# Patient Record
Sex: Female | Born: 1935 | Race: Black or African American | Hispanic: No | Smoking: Never smoker
Health system: Southern US, Community
[De-identification: ages and names within clinical notes are randomized; demographics above are authoritative.]

## PROBLEM LIST (undated history)

## (undated) DIAGNOSIS — Z860101 Personal history of adenomatous and serrated colon polyps: Secondary | ICD-10-CM

## (undated) DIAGNOSIS — E669 Obesity, unspecified: Secondary | ICD-10-CM

## (undated) DIAGNOSIS — K449 Diaphragmatic hernia without obstruction or gangrene: Secondary | ICD-10-CM

## (undated) DIAGNOSIS — N289 Disorder of kidney and ureter, unspecified: Secondary | ICD-10-CM

## (undated) DIAGNOSIS — K5790 Diverticulosis of intestine, part unspecified, without perforation or abscess without bleeding: Secondary | ICD-10-CM

## (undated) DIAGNOSIS — M1990 Unspecified osteoarthritis, unspecified site: Secondary | ICD-10-CM

## (undated) DIAGNOSIS — K222 Esophageal obstruction: Secondary | ICD-10-CM

## (undated) DIAGNOSIS — R609 Edema, unspecified: Secondary | ICD-10-CM

## (undated) DIAGNOSIS — E119 Type 2 diabetes mellitus without complications: Secondary | ICD-10-CM

## (undated) DIAGNOSIS — K219 Gastro-esophageal reflux disease without esophagitis: Secondary | ICD-10-CM

## (undated) DIAGNOSIS — D139 Benign neoplasm of ill-defined sites within the digestive system: Secondary | ICD-10-CM

## (undated) DIAGNOSIS — D1399 Benign neoplasm of ill-defined sites within the digestive system: Secondary | ICD-10-CM

## (undated) DIAGNOSIS — I119 Hypertensive heart disease without heart failure: Secondary | ICD-10-CM

## (undated) DIAGNOSIS — I10 Essential (primary) hypertension: Secondary | ICD-10-CM

## (undated) DIAGNOSIS — D509 Iron deficiency anemia, unspecified: Secondary | ICD-10-CM

## (undated) DIAGNOSIS — E785 Hyperlipidemia, unspecified: Secondary | ICD-10-CM

## (undated) DIAGNOSIS — R109 Unspecified abdominal pain: Secondary | ICD-10-CM

## (undated) DIAGNOSIS — Z86010 Personal history of colonic polyps: Secondary | ICD-10-CM

## (undated) DIAGNOSIS — F0390 Unspecified dementia without behavioral disturbance: Secondary | ICD-10-CM

## (undated) DIAGNOSIS — G009 Bacterial meningitis, unspecified: Secondary | ICD-10-CM

## (undated) DIAGNOSIS — K2950 Unspecified chronic gastritis without bleeding: Secondary | ICD-10-CM

## (undated) DIAGNOSIS — G8929 Other chronic pain: Secondary | ICD-10-CM

## (undated) HISTORY — DX: Benign neoplasm of ill-defined sites within the digestive system: D13.9

## (undated) HISTORY — DX: Unspecified chronic gastritis without bleeding: K29.50

## (undated) HISTORY — DX: Gastro-esophageal reflux disease without esophagitis: K21.9

## (undated) HISTORY — PX: BACK SURGERY: SHX140

## (undated) HISTORY — DX: Essential (primary) hypertension: I10

## (undated) HISTORY — DX: Personal history of adenomatous and serrated colon polyps: Z86.0101

## (undated) HISTORY — DX: Hyperlipidemia, unspecified: E78.5

## (undated) HISTORY — DX: Unspecified osteoarthritis, unspecified site: M19.90

## (undated) HISTORY — DX: Other chronic pain: G89.29

## (undated) HISTORY — DX: Hypertensive heart disease without heart failure: I11.9

## (undated) HISTORY — DX: Diverticulosis of intestine, part unspecified, without perforation or abscess without bleeding: K57.90

## (undated) HISTORY — DX: Personal history of colonic polyps: Z86.010

## (undated) HISTORY — PX: APPENDECTOMY: SHX54

## (undated) HISTORY — DX: Esophageal obstruction: K22.2

## (undated) HISTORY — DX: Bacterial meningitis, unspecified: G00.9

## (undated) HISTORY — DX: Type 2 diabetes mellitus without complications: E11.9

## (undated) HISTORY — DX: Unspecified dementia, unspecified severity, without behavioral disturbance, psychotic disturbance, mood disturbance, and anxiety: F03.90

## (undated) HISTORY — DX: Unspecified abdominal pain: R10.9

## (undated) HISTORY — DX: Edema, unspecified: R60.9

## (undated) HISTORY — DX: Diaphragmatic hernia without obstruction or gangrene: K44.9

## (undated) HISTORY — DX: Obesity, unspecified: E66.9

## (undated) HISTORY — DX: Iron deficiency anemia, unspecified: D50.9

## (undated) HISTORY — DX: Benign neoplasm of ill-defined sites within the digestive system: D13.99

---
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
## 1997-08-12 HISTORY — PX: CHOLECYSTECTOMY: SHX55

## 1997-11-22 ENCOUNTER — Ambulatory Visit (HOSPITAL_COMMUNITY): Admission: RE | Admit: 1997-11-22 | Discharge: 1997-11-22 | Payer: Self-pay | Admitting: *Deleted

## 1998-02-20 ENCOUNTER — Ambulatory Visit (HOSPITAL_COMMUNITY): Admission: RE | Admit: 1998-02-20 | Discharge: 1998-02-21 | Payer: Self-pay

## 2000-06-16 ENCOUNTER — Encounter: Admission: RE | Admit: 2000-06-16 | Discharge: 2000-06-16 | Payer: Self-pay | Admitting: Neurosurgery

## 2000-06-16 ENCOUNTER — Encounter: Payer: Self-pay | Admitting: Neurosurgery

## 2000-07-23 ENCOUNTER — Ambulatory Visit (HOSPITAL_COMMUNITY): Admission: RE | Admit: 2000-07-23 | Discharge: 2000-07-23 | Payer: Self-pay | Admitting: Neurosurgery

## 2000-07-23 ENCOUNTER — Encounter: Payer: Self-pay | Admitting: Neurosurgery

## 2000-08-06 ENCOUNTER — Ambulatory Visit (HOSPITAL_COMMUNITY): Admission: RE | Admit: 2000-08-06 | Discharge: 2000-08-06 | Payer: Self-pay | Admitting: Neurosurgery

## 2000-08-06 ENCOUNTER — Encounter: Payer: Self-pay | Admitting: Neurosurgery

## 2000-08-20 ENCOUNTER — Ambulatory Visit (HOSPITAL_COMMUNITY): Admission: RE | Admit: 2000-08-20 | Discharge: 2000-08-20 | Payer: Self-pay | Admitting: Neurosurgery

## 2000-08-20 ENCOUNTER — Encounter: Payer: Self-pay | Admitting: Neurosurgery

## 2001-03-02 ENCOUNTER — Encounter: Admission: RE | Admit: 2001-03-02 | Discharge: 2001-03-02 | Payer: Self-pay | Admitting: Neurosurgery

## 2001-03-02 ENCOUNTER — Encounter: Payer: Self-pay | Admitting: Neurosurgery

## 2001-07-01 ENCOUNTER — Ambulatory Visit (HOSPITAL_COMMUNITY): Admission: RE | Admit: 2001-07-01 | Discharge: 2001-07-01 | Payer: Self-pay | Admitting: Gastroenterology

## 2001-07-01 ENCOUNTER — Encounter: Payer: Self-pay | Admitting: Gastroenterology

## 2001-09-24 ENCOUNTER — Encounter: Payer: Self-pay | Admitting: Gastroenterology

## 2001-09-24 ENCOUNTER — Ambulatory Visit (HOSPITAL_COMMUNITY): Admission: RE | Admit: 2001-09-24 | Discharge: 2001-09-24 | Payer: Self-pay | Admitting: Gastroenterology

## 2001-10-05 ENCOUNTER — Encounter: Admission: RE | Admit: 2001-10-05 | Discharge: 2002-01-03 | Payer: Self-pay | Admitting: Internal Medicine

## 2002-06-22 ENCOUNTER — Encounter: Admission: RE | Admit: 2002-06-22 | Discharge: 2002-09-20 | Payer: Self-pay | Admitting: Internal Medicine

## 2003-10-29 ENCOUNTER — Inpatient Hospital Stay (HOSPITAL_COMMUNITY): Admission: EM | Admit: 2003-10-29 | Discharge: 2003-10-31 | Payer: Self-pay | Admitting: Emergency Medicine

## 2004-01-05 ENCOUNTER — Ambulatory Visit (HOSPITAL_COMMUNITY): Admission: RE | Admit: 2004-01-05 | Discharge: 2004-01-05 | Payer: Self-pay | Admitting: Neurosurgery

## 2004-02-10 HISTORY — PX: ROTATOR CUFF REPAIR: SHX139

## 2004-02-27 ENCOUNTER — Encounter: Admission: RE | Admit: 2004-02-27 | Discharge: 2004-02-27 | Payer: Self-pay | Admitting: Orthopedic Surgery

## 2004-02-28 ENCOUNTER — Ambulatory Visit (HOSPITAL_BASED_OUTPATIENT_CLINIC_OR_DEPARTMENT_OTHER): Admission: RE | Admit: 2004-02-28 | Discharge: 2004-02-28 | Payer: Self-pay | Admitting: Orthopedic Surgery

## 2004-02-28 ENCOUNTER — Ambulatory Visit (HOSPITAL_COMMUNITY): Admission: RE | Admit: 2004-02-28 | Discharge: 2004-02-28 | Payer: Self-pay | Admitting: Orthopedic Surgery

## 2004-07-23 ENCOUNTER — Ambulatory Visit: Payer: Self-pay | Admitting: Internal Medicine

## 2004-09-05 ENCOUNTER — Ambulatory Visit: Payer: Self-pay | Admitting: Internal Medicine

## 2005-02-05 ENCOUNTER — Ambulatory Visit: Payer: Self-pay | Admitting: Internal Medicine

## 2005-04-17 ENCOUNTER — Ambulatory Visit: Payer: Self-pay | Admitting: Internal Medicine

## 2005-05-01 ENCOUNTER — Ambulatory Visit: Payer: Self-pay | Admitting: Internal Medicine

## 2005-05-08 ENCOUNTER — Ambulatory Visit: Payer: Self-pay | Admitting: Internal Medicine

## 2005-05-20 ENCOUNTER — Encounter: Admission: RE | Admit: 2005-05-20 | Discharge: 2005-08-11 | Payer: Self-pay | Admitting: Internal Medicine

## 2005-06-19 ENCOUNTER — Ambulatory Visit: Payer: Self-pay | Admitting: Internal Medicine

## 2005-07-19 ENCOUNTER — Ambulatory Visit: Payer: Self-pay | Admitting: Gastroenterology

## 2005-07-23 ENCOUNTER — Ambulatory Visit: Payer: Self-pay | Admitting: Cardiology

## 2005-08-12 HISTORY — PX: HERNIA REPAIR: SHX51

## 2005-08-22 ENCOUNTER — Encounter: Admission: RE | Admit: 2005-08-22 | Discharge: 2005-08-22 | Payer: Self-pay | Admitting: Surgery

## 2005-08-23 ENCOUNTER — Ambulatory Visit (HOSPITAL_BASED_OUTPATIENT_CLINIC_OR_DEPARTMENT_OTHER): Admission: RE | Admit: 2005-08-23 | Discharge: 2005-08-23 | Payer: Self-pay | Admitting: Surgery

## 2005-08-23 ENCOUNTER — Encounter (INDEPENDENT_AMBULATORY_CARE_PROVIDER_SITE_OTHER): Payer: Self-pay | Admitting: Specialist

## 2005-09-17 ENCOUNTER — Ambulatory Visit: Payer: Self-pay | Admitting: Internal Medicine

## 2005-10-15 ENCOUNTER — Encounter: Admission: RE | Admit: 2005-10-15 | Discharge: 2006-01-13 | Payer: Self-pay | Admitting: Internal Medicine

## 2005-12-11 ENCOUNTER — Encounter: Payer: Self-pay | Admitting: Gastroenterology

## 2005-12-17 ENCOUNTER — Ambulatory Visit: Payer: Self-pay | Admitting: Internal Medicine

## 2006-01-28 ENCOUNTER — Ambulatory Visit: Payer: Self-pay | Admitting: Internal Medicine

## 2006-05-01 ENCOUNTER — Ambulatory Visit: Payer: Self-pay | Admitting: Internal Medicine

## 2006-05-02 ENCOUNTER — Ambulatory Visit: Payer: Self-pay | Admitting: Pulmonary Disease

## 2006-06-02 ENCOUNTER — Ambulatory Visit: Payer: Self-pay | Admitting: Pulmonary Disease

## 2006-06-30 ENCOUNTER — Ambulatory Visit: Payer: Self-pay | Admitting: Internal Medicine

## 2006-07-14 ENCOUNTER — Ambulatory Visit: Payer: Self-pay | Admitting: Internal Medicine

## 2006-07-16 LAB — CONVERTED CEMR LAB
Albumin: 3.3 g/dL — ABNORMAL LOW (ref 3.5–5.2)
Alkaline Phosphatase: 74 units/L (ref 39–117)
BUN: 18 mg/dL (ref 6–23)
Basophils Absolute: 0 10*3/uL (ref 0.0–0.1)
CO2: 28 meq/L (ref 19–32)
Cholesterol: 159 mg/dL (ref 0–200)
GFR calc non Af Amer: 38 mL/min
Glomerular Filtration Rate, Af Am: 47 mL/min/{1.73_m2}
Glucose, Bld: 97 mg/dL (ref 70–99)
Hemoglobin: 12.1 g/dL (ref 12.0–15.0)
Hgb A1c MFr Bld: 6.3 % — ABNORMAL HIGH (ref 4.6–6.0)
Ketones, ur: NEGATIVE mg/dL
LDL Cholesterol: 93 mg/dL (ref 0–99)
Monocytes Relative: 9.1 % (ref 3.0–11.0)
Nitrite: NEGATIVE
Platelets: 241 10*3/uL (ref 150–400)
Potassium: 5 meq/L (ref 3.5–5.1)
Pro B Natriuretic peptide (BNP): 95 pg/mL (ref 0.0–100.0)
RBC: 5.07 M/uL (ref 3.87–5.11)
RDW: 12.1 % (ref 11.5–14.6)
Sodium: 141 meq/L (ref 135–145)
Total Bilirubin: 0.8 mg/dL (ref 0.3–1.2)
Total Protein: 7.4 g/dL (ref 6.0–8.3)
Urobilinogen, UA: 1 (ref 0.0–1.0)

## 2006-10-13 ENCOUNTER — Ambulatory Visit: Payer: Self-pay | Admitting: Internal Medicine

## 2006-10-13 LAB — CONVERTED CEMR LAB
GFR calc non Af Amer: 38 mL/min
Glucose, Bld: 151 mg/dL — ABNORMAL HIGH (ref 70–99)
Hgb A1c MFr Bld: 6.5 % — ABNORMAL HIGH (ref 4.6–6.0)
Potassium: 4.2 meq/L (ref 3.5–5.1)
Sodium: 143 meq/L (ref 135–145)

## 2006-10-14 ENCOUNTER — Ambulatory Visit: Payer: Self-pay | Admitting: Gastroenterology

## 2006-11-24 ENCOUNTER — Ambulatory Visit: Payer: Self-pay | Admitting: Internal Medicine

## 2006-12-08 ENCOUNTER — Encounter: Admission: RE | Admit: 2006-12-08 | Discharge: 2007-03-08 | Payer: Self-pay | Admitting: Internal Medicine

## 2007-01-12 ENCOUNTER — Ambulatory Visit: Payer: Self-pay | Admitting: Internal Medicine

## 2007-01-12 LAB — CONVERTED CEMR LAB
GFR calc non Af Amer: 35 mL/min
Glucose, Bld: 197 mg/dL — ABNORMAL HIGH (ref 70–99)
Hgb A1c MFr Bld: 6.2 % — ABNORMAL HIGH (ref 4.6–6.0)
Potassium: 4.3 meq/L (ref 3.5–5.1)
Sodium: 142 meq/L (ref 135–145)

## 2007-04-14 ENCOUNTER — Ambulatory Visit: Payer: Self-pay | Admitting: Internal Medicine

## 2007-04-14 LAB — CONVERTED CEMR LAB
Creatinine, Ser: 1.3 mg/dL — ABNORMAL HIGH (ref 0.4–1.2)
Glucose, Bld: 120 mg/dL — ABNORMAL HIGH (ref 70–99)
Hgb A1c MFr Bld: 5.9 % (ref 4.6–6.0)
Potassium: 4.5 meq/L (ref 3.5–5.1)
Sodium: 142 meq/L (ref 135–145)

## 2007-06-24 DIAGNOSIS — E119 Type 2 diabetes mellitus without complications: Secondary | ICD-10-CM

## 2007-06-24 DIAGNOSIS — I10 Essential (primary) hypertension: Secondary | ICD-10-CM | POA: Insufficient documentation

## 2007-06-24 DIAGNOSIS — M129 Arthropathy, unspecified: Secondary | ICD-10-CM | POA: Insufficient documentation

## 2007-07-15 ENCOUNTER — Ambulatory Visit: Payer: Self-pay | Admitting: Internal Medicine

## 2007-07-15 LAB — CONVERTED CEMR LAB
ALT: 13 units/L (ref 0–35)
AST: 17 units/L (ref 0–37)
Alkaline Phosphatase: 84 units/L (ref 39–117)
BUN: 17 mg/dL (ref 6–23)
Basophils Relative: 0 % (ref 0.0–1.0)
Bilirubin Urine: NEGATIVE
Bilirubin, Direct: 0.1 mg/dL (ref 0.0–0.3)
CO2: 26 meq/L (ref 19–32)
Calcium: 9.8 mg/dL (ref 8.4–10.5)
Chloride: 104 meq/L (ref 96–112)
Creatinine, Ser: 1.3 mg/dL — ABNORMAL HIGH (ref 0.4–1.2)
Eosinophils Relative: 2.4 % (ref 0.0–5.0)
GFR calc Af Amer: 51 mL/min
Glucose, Bld: 128 mg/dL — ABNORMAL HIGH (ref 70–99)
Ketones, ur: NEGATIVE mg/dL
Lymphocytes Relative: 24.5 % (ref 12.0–46.0)
Monocytes Relative: 6.8 % (ref 3.0–11.0)
Nitrite: NEGATIVE
Platelets: 238 10*3/uL (ref 150–400)
RBC: 5.02 M/uL (ref 3.87–5.11)
RDW: 11.9 % (ref 11.5–14.6)
Specific Gravity, Urine: 1.025 (ref 1.000–1.03)
TSH: 1.42 microintl units/mL (ref 0.35–5.50)
Total CHOL/HDL Ratio: 3.3
Total Protein, Urine: NEGATIVE mg/dL
Total Protein: 7.5 g/dL (ref 6.0–8.3)
Urine Glucose: NEGATIVE mg/dL
VLDL: 16 mg/dL (ref 0–40)
WBC: 7.1 10*3/uL (ref 4.5–10.5)
pH: 5.5 (ref 5.0–8.0)

## 2007-07-23 ENCOUNTER — Ambulatory Visit: Payer: Self-pay | Admitting: Internal Medicine

## 2007-07-23 DIAGNOSIS — E785 Hyperlipidemia, unspecified: Secondary | ICD-10-CM

## 2007-10-01 ENCOUNTER — Ambulatory Visit: Payer: Self-pay | Admitting: Internal Medicine

## 2007-10-21 ENCOUNTER — Encounter: Payer: Self-pay | Admitting: Internal Medicine

## 2007-10-29 ENCOUNTER — Encounter (INDEPENDENT_AMBULATORY_CARE_PROVIDER_SITE_OTHER): Payer: Self-pay | Admitting: *Deleted

## 2007-10-29 ENCOUNTER — Ambulatory Visit: Payer: Self-pay | Admitting: Internal Medicine

## 2007-10-29 DIAGNOSIS — K219 Gastro-esophageal reflux disease without esophagitis: Secondary | ICD-10-CM

## 2007-10-29 LAB — CONVERTED CEMR LAB
Calcium: 9.8 mg/dL (ref 8.4–10.5)
Chloride: 104 meq/L (ref 96–112)
Creatinine, Ser: 1.4 mg/dL — ABNORMAL HIGH (ref 0.4–1.2)
GFR calc non Af Amer: 38 mL/min
Glucose, Bld: 149 mg/dL — ABNORMAL HIGH (ref 70–99)
Hgb A1c MFr Bld: 6.6 % — ABNORMAL HIGH (ref 4.6–6.0)
Sodium: 139 meq/L (ref 135–145)

## 2007-11-17 ENCOUNTER — Ambulatory Visit: Payer: Self-pay | Admitting: Gastroenterology

## 2007-12-10 ENCOUNTER — Telehealth: Payer: Self-pay | Admitting: Gastroenterology

## 2007-12-14 ENCOUNTER — Encounter: Payer: Self-pay | Admitting: Gastroenterology

## 2007-12-14 ENCOUNTER — Ambulatory Visit: Payer: Self-pay | Admitting: Gastroenterology

## 2008-01-11 DIAGNOSIS — D126 Benign neoplasm of colon, unspecified: Secondary | ICD-10-CM

## 2008-01-11 DIAGNOSIS — D139 Benign neoplasm of ill-defined sites within the digestive system: Secondary | ICD-10-CM

## 2008-01-11 DIAGNOSIS — K222 Esophageal obstruction: Secondary | ICD-10-CM

## 2008-01-11 DIAGNOSIS — K449 Diaphragmatic hernia without obstruction or gangrene: Secondary | ICD-10-CM | POA: Insufficient documentation

## 2008-01-11 DIAGNOSIS — R5383 Other fatigue: Secondary | ICD-10-CM

## 2008-01-11 DIAGNOSIS — R5381 Other malaise: Secondary | ICD-10-CM | POA: Insufficient documentation

## 2008-01-11 DIAGNOSIS — K5730 Diverticulosis of large intestine without perforation or abscess without bleeding: Secondary | ICD-10-CM | POA: Insufficient documentation

## 2008-01-11 DIAGNOSIS — K2940 Chronic atrophic gastritis without bleeding: Secondary | ICD-10-CM | POA: Insufficient documentation

## 2008-01-11 DIAGNOSIS — M1990 Unspecified osteoarthritis, unspecified site: Secondary | ICD-10-CM | POA: Insufficient documentation

## 2008-01-11 DIAGNOSIS — R109 Unspecified abdominal pain: Secondary | ICD-10-CM | POA: Insufficient documentation

## 2008-01-12 ENCOUNTER — Ambulatory Visit: Payer: Self-pay | Admitting: Gastroenterology

## 2008-01-12 DIAGNOSIS — R159 Full incontinence of feces: Secondary | ICD-10-CM | POA: Insufficient documentation

## 2008-02-13 ENCOUNTER — Encounter: Payer: Self-pay | Admitting: Gastroenterology

## 2008-02-15 ENCOUNTER — Encounter: Payer: Self-pay | Admitting: Gastroenterology

## 2008-02-16 ENCOUNTER — Encounter: Payer: Self-pay | Admitting: Internal Medicine

## 2008-02-22 ENCOUNTER — Ambulatory Visit: Payer: Self-pay | Admitting: Internal Medicine

## 2008-02-22 DIAGNOSIS — N189 Chronic kidney disease, unspecified: Secondary | ICD-10-CM

## 2008-02-23 LAB — CONVERTED CEMR LAB
BUN: 20 mg/dL (ref 6–23)
CO2: 28 meq/L (ref 19–32)
Chloride: 104 meq/L (ref 96–112)
GFR calc non Af Amer: 33 mL/min
Hgb A1c MFr Bld: 8.7 % — ABNORMAL HIGH (ref 4.6–6.0)
Potassium: 4.6 meq/L (ref 3.5–5.1)

## 2008-03-07 ENCOUNTER — Ambulatory Visit: Payer: Self-pay | Admitting: Internal Medicine

## 2008-03-22 ENCOUNTER — Ambulatory Visit: Payer: Self-pay | Admitting: Internal Medicine

## 2008-03-24 ENCOUNTER — Telehealth: Payer: Self-pay | Admitting: Gastroenterology

## 2008-04-27 ENCOUNTER — Ambulatory Visit: Payer: Self-pay | Admitting: Internal Medicine

## 2008-06-20 ENCOUNTER — Ambulatory Visit: Payer: Self-pay | Admitting: Internal Medicine

## 2008-06-20 DIAGNOSIS — R0989 Other specified symptoms and signs involving the circulatory and respiratory systems: Secondary | ICD-10-CM

## 2008-06-20 DIAGNOSIS — R0609 Other forms of dyspnea: Secondary | ICD-10-CM

## 2008-06-20 LAB — CONVERTED CEMR LAB
CO2: 27 meq/L (ref 19–32)
Chloride: 108 meq/L (ref 96–112)
GFR calc non Af Amer: 35 mL/min
Hgb A1c MFr Bld: 6.3 % — ABNORMAL HIGH (ref 4.6–6.0)
Potassium: 4.7 meq/L (ref 3.5–5.1)
Pro B Natriuretic peptide (BNP): 80 pg/mL (ref 0.0–100.0)

## 2008-08-08 ENCOUNTER — Telehealth (INDEPENDENT_AMBULATORY_CARE_PROVIDER_SITE_OTHER): Payer: Self-pay | Admitting: *Deleted

## 2008-08-10 ENCOUNTER — Ambulatory Visit: Payer: Self-pay | Admitting: Internal Medicine

## 2008-08-10 ENCOUNTER — Ambulatory Visit: Payer: Self-pay

## 2008-08-10 DIAGNOSIS — M79609 Pain in unspecified limb: Secondary | ICD-10-CM

## 2008-08-24 ENCOUNTER — Ambulatory Visit: Payer: Self-pay | Admitting: Internal Medicine

## 2008-09-12 ENCOUNTER — Telehealth: Payer: Self-pay | Admitting: Adult Health

## 2008-09-13 ENCOUNTER — Telehealth (INDEPENDENT_AMBULATORY_CARE_PROVIDER_SITE_OTHER): Payer: Self-pay | Admitting: *Deleted

## 2008-10-21 ENCOUNTER — Ambulatory Visit: Payer: Self-pay | Admitting: Internal Medicine

## 2008-10-24 LAB — CONVERTED CEMR LAB
Chloride: 107 meq/L (ref 96–112)
GFR calc Af Amer: 46 mL/min
GFR calc non Af Amer: 38 mL/min
Hgb A1c MFr Bld: 6.3 % — ABNORMAL HIGH (ref 4.6–6.0)
Potassium: 4.4 meq/L (ref 3.5–5.1)
Sodium: 142 meq/L (ref 135–145)

## 2008-10-27 ENCOUNTER — Encounter: Payer: Self-pay | Admitting: Internal Medicine

## 2008-12-13 ENCOUNTER — Ambulatory Visit: Payer: Self-pay | Admitting: Internal Medicine

## 2009-01-20 ENCOUNTER — Ambulatory Visit: Payer: Self-pay | Admitting: Internal Medicine

## 2009-01-20 DIAGNOSIS — R609 Edema, unspecified: Secondary | ICD-10-CM | POA: Insufficient documentation

## 2009-01-20 DIAGNOSIS — D649 Anemia, unspecified: Secondary | ICD-10-CM

## 2009-01-21 LAB — CONVERTED CEMR LAB
Albumin: 3.6 g/dL (ref 3.5–5.2)
BUN: 19 mg/dL (ref 6–23)
Basophils Relative: 0 % (ref 0.0–3.0)
Bilirubin Urine: NEGATIVE
Bilirubin, Direct: 0.1 mg/dL (ref 0.0–0.3)
CO2: 28 meq/L (ref 19–32)
Chloride: 105 meq/L (ref 96–112)
Cholesterol: 162 mg/dL (ref 0–200)
Creatinine, Ser: 1.5 mg/dL — ABNORMAL HIGH (ref 0.4–1.2)
Eosinophils Absolute: 0.2 10*3/uL (ref 0.0–0.7)
Glucose, Bld: 107 mg/dL — ABNORMAL HIGH (ref 70–99)
Hgb A1c MFr Bld: 6.7 % — ABNORMAL HIGH (ref 4.6–6.5)
Ketones, ur: NEGATIVE mg/dL
Leukocytes, UA: NEGATIVE
MCHC: 32.1 g/dL (ref 30.0–36.0)
MCV: 77.5 fL — ABNORMAL LOW (ref 78.0–100.0)
Monocytes Absolute: 0.5 10*3/uL (ref 0.1–1.0)
Neutro Abs: 4.2 10*3/uL (ref 1.4–7.7)
Neutrophils Relative %: 69.6 % (ref 43.0–77.0)
Nitrite: NEGATIVE
Pro B Natriuretic peptide (BNP): 34 pg/mL (ref 0.0–100.0)
RBC: 4.71 M/uL (ref 3.87–5.11)
RDW: 12.5 % (ref 11.5–14.6)
Specific Gravity, Urine: >=1.03 (ref 1.000–1.030)
Total CHOL/HDL Ratio: 3
Total Protein: 7.4 g/dL (ref 6.0–8.3)
Triglycerides: 89 mg/dL (ref 0.0–149.0)
Urobilinogen, UA: 1 (ref 0.0–1.0)

## 2009-02-03 ENCOUNTER — Ambulatory Visit: Payer: Self-pay | Admitting: Internal Medicine

## 2009-02-16 ENCOUNTER — Ambulatory Visit: Payer: Self-pay | Admitting: Internal Medicine

## 2009-02-17 LAB — CONVERTED CEMR LAB
Fecal Occult Blood: NEGATIVE
OCCULT 2: NEGATIVE
OCCULT 3: NEGATIVE
OCCULT 4: NEGATIVE
OCCULT 5: NEGATIVE

## 2009-03-13 ENCOUNTER — Encounter: Payer: Self-pay | Admitting: Internal Medicine

## 2009-03-17 ENCOUNTER — Ambulatory Visit: Payer: Self-pay | Admitting: Internal Medicine

## 2009-03-17 ENCOUNTER — Ambulatory Visit: Payer: Self-pay

## 2009-03-20 ENCOUNTER — Encounter: Payer: Self-pay | Admitting: Cardiovascular Disease

## 2009-03-20 ENCOUNTER — Telehealth (INDEPENDENT_AMBULATORY_CARE_PROVIDER_SITE_OTHER): Payer: Self-pay | Admitting: *Deleted

## 2009-03-21 LAB — CONVERTED CEMR LAB
Basophils Relative: 0 % (ref 0.0–3.0)
Calcium: 9.2 mg/dL (ref 8.4–10.5)
Creatinine, Ser: 1.5 mg/dL — ABNORMAL HIGH (ref 0.4–1.2)
Eosinophils Absolute: 0.1 10*3/uL (ref 0.0–0.7)
Eosinophils Relative: 2.2 % (ref 0.0–5.0)
Iron: 71 ug/dL (ref 42–145)
Lymphocytes Relative: 19.3 % (ref 12.0–46.0)
MCHC: 33.6 g/dL (ref 30.0–36.0)
Neutrophils Relative %: 72.8 % (ref 43.0–77.0)
RBC: 4.64 M/uL (ref 3.87–5.11)
Transferrin: 229.7 mg/dL (ref 212.0–360.0)
WBC: 6.4 10*3/uL (ref 4.5–10.5)

## 2009-04-11 ENCOUNTER — Telehealth: Payer: Self-pay | Admitting: Adult Health

## 2009-04-26 ENCOUNTER — Ambulatory Visit: Payer: Self-pay | Admitting: Internal Medicine

## 2009-04-26 DIAGNOSIS — L0291 Cutaneous abscess, unspecified: Secondary | ICD-10-CM

## 2009-04-26 DIAGNOSIS — L0390 Cellulitis, unspecified: Secondary | ICD-10-CM

## 2009-04-27 LAB — CONVERTED CEMR LAB
AST: 35 units/L (ref 0–37)
BUN: 12 mg/dL (ref 6–23)
Basophils Relative: 0.8 % (ref 0.0–3.0)
Calcium: 9.7 mg/dL (ref 8.4–10.5)
Eosinophils Relative: 1.7 % (ref 0.0–5.0)
GFR calc non Af Amer: 46.11 mL/min (ref >60)
HCT: 38.8 % (ref 36.0–46.0)
Hemoglobin: 12.4 g/dL (ref 12.0–15.0)
Lymphs Abs: 1.3 10*3/uL (ref 0.7–4.0)
MCV: 77.9 fL — ABNORMAL LOW (ref 78.0–100.0)
Monocytes Absolute: 0.5 10*3/uL (ref 0.1–1.0)
Neutro Abs: 4.3 10*3/uL (ref 1.4–7.7)
Platelets: 227 10*3/uL (ref 150.0–400.0)
Potassium: 4.1 meq/L (ref 3.5–5.1)
RBC: 4.98 M/uL (ref 3.87–5.11)
Total Bilirubin: 0.6 mg/dL (ref 0.3–1.2)
WBC: 6.3 10*3/uL (ref 4.5–10.5)

## 2009-05-16 ENCOUNTER — Telehealth: Payer: Self-pay | Admitting: Gastroenterology

## 2009-05-16 ENCOUNTER — Encounter: Payer: Self-pay | Admitting: Internal Medicine

## 2009-05-22 ENCOUNTER — Encounter: Payer: Self-pay | Admitting: Internal Medicine

## 2009-06-07 ENCOUNTER — Ambulatory Visit: Payer: Self-pay | Admitting: Internal Medicine

## 2009-06-07 DIAGNOSIS — F411 Generalized anxiety disorder: Secondary | ICD-10-CM | POA: Insufficient documentation

## 2009-06-08 LAB — CONVERTED CEMR LAB
BUN: 16 mg/dL (ref 6–23)
CO2: 30 meq/L (ref 19–32)
Calcium: 9.5 mg/dL (ref 8.4–10.5)
Chloride: 105 meq/L (ref 96–112)
Creatinine, Ser: 1.5 mg/dL — ABNORMAL HIGH (ref 0.4–1.2)
Glucose, Bld: 151 mg/dL — ABNORMAL HIGH (ref 70–99)

## 2009-08-21 ENCOUNTER — Ambulatory Visit: Payer: Self-pay | Admitting: Internal Medicine

## 2009-08-23 LAB — CONVERTED CEMR LAB
BUN: 11 mg/dL (ref 6–23)
Basophils Absolute: 0 10*3/uL (ref 0.0–0.1)
Chloride: 103 meq/L (ref 96–112)
Creatinine, Ser: 1.5 mg/dL — ABNORMAL HIGH (ref 0.4–1.2)
Eosinophils Absolute: 0.1 10*3/uL (ref 0.0–0.7)
GFR calc non Af Amer: 42.55 mL/min (ref >60)
Hgb A1c MFr Bld: 6.7 % — ABNORMAL HIGH (ref 4.6–6.5)
Lymphocytes Relative: 15.6 % (ref 12.0–46.0)
MCHC: 30.4 g/dL (ref 30.0–36.0)
MCV: 79 fL (ref 78.0–100.0)
Monocytes Absolute: 0.5 10*3/uL (ref 0.1–1.0)
Neutro Abs: 4.6 10*3/uL (ref 1.4–7.7)
Neutrophils Relative %: 74.7 % (ref 43.0–77.0)
Potassium: 4 meq/L (ref 3.5–5.1)
RDW: 12.4 % (ref 11.5–14.6)

## 2009-11-01 ENCOUNTER — Encounter: Payer: Self-pay | Admitting: Internal Medicine

## 2009-11-07 ENCOUNTER — Encounter: Payer: Self-pay | Admitting: Internal Medicine

## 2009-11-20 ENCOUNTER — Ambulatory Visit: Payer: Self-pay | Admitting: Internal Medicine

## 2010-02-19 ENCOUNTER — Ambulatory Visit: Payer: Self-pay | Admitting: Internal Medicine

## 2010-02-19 LAB — CONVERTED CEMR LAB
CO2: 30 meq/L (ref 19–32)
Calcium: 9.6 mg/dL (ref 8.4–10.5)
Creatinine, Ser: 1.2 mg/dL (ref 0.4–1.2)
Hgb A1c MFr Bld: 6.5 % (ref 4.6–6.5)
Sodium: 143 meq/L (ref 135–145)

## 2010-03-01 ENCOUNTER — Telehealth: Payer: Self-pay | Admitting: Adult Health

## 2010-05-04 ENCOUNTER — Encounter: Payer: Self-pay | Admitting: Internal Medicine

## 2010-05-21 ENCOUNTER — Ambulatory Visit: Payer: Self-pay

## 2010-05-21 ENCOUNTER — Ambulatory Visit: Payer: Self-pay | Admitting: Internal Medicine

## 2010-05-21 LAB — CONVERTED CEMR LAB
Basophils Relative: 0.9 % (ref 0.0–3.0)
Calcium: 9.2 mg/dL (ref 8.4–10.5)
Eosinophils Absolute: 0.1 10*3/uL (ref 0.0–0.7)
Eosinophils Relative: 1.1 % (ref 0.0–5.0)
GFR calc non Af Amer: 51.47 mL/min (ref >60)
Glucose, Bld: 128 mg/dL — ABNORMAL HIGH (ref 70–99)
Hemoglobin: 12.5 g/dL (ref 12.0–15.0)
Hgb A1c MFr Bld: 6.3 % (ref 4.6–6.5)
Lymphocytes Relative: 23.5 % (ref 12.0–46.0)
Monocytes Relative: 7.1 % (ref 3.0–12.0)
Neutro Abs: 3.7 10*3/uL (ref 1.4–7.7)
Neutrophils Relative %: 67.4 % (ref 43.0–77.0)
Potassium: 4.1 meq/L (ref 3.5–5.1)
RBC: 5.04 M/uL (ref 3.87–5.11)
Saturation Ratios: 22.7 % (ref 20.0–50.0)
Sodium: 141 meq/L (ref 135–145)
Transferrin: 258.2 mg/dL (ref 212.0–360.0)
WBC: 5.5 10*3/uL (ref 4.5–10.5)

## 2010-08-07 ENCOUNTER — Telehealth (INDEPENDENT_AMBULATORY_CARE_PROVIDER_SITE_OTHER): Payer: Self-pay | Admitting: *Deleted

## 2010-08-17 ENCOUNTER — Ambulatory Visit
Admission: RE | Admit: 2010-08-17 | Discharge: 2010-08-17 | Payer: Self-pay | Source: Home / Self Care | Attending: Internal Medicine | Admitting: Internal Medicine

## 2010-08-17 ENCOUNTER — Telehealth: Payer: Self-pay | Admitting: Internal Medicine

## 2010-08-17 DIAGNOSIS — J209 Acute bronchitis, unspecified: Secondary | ICD-10-CM | POA: Insufficient documentation

## 2010-08-28 ENCOUNTER — Encounter: Payer: Self-pay | Admitting: Internal Medicine

## 2010-08-28 ENCOUNTER — Ambulatory Visit
Admission: RE | Admit: 2010-08-28 | Discharge: 2010-08-28 | Payer: Self-pay | Source: Home / Self Care | Attending: Internal Medicine | Admitting: Internal Medicine

## 2010-08-28 ENCOUNTER — Other Ambulatory Visit: Payer: Self-pay | Admitting: Internal Medicine

## 2010-08-28 LAB — CBC WITH DIFFERENTIAL/PLATELET
Basophils Absolute: 0 10*3/uL (ref 0.0–0.1)
Basophils Relative: 0.8 % (ref 0.0–3.0)
Eosinophils Absolute: 0.1 10*3/uL (ref 0.0–0.7)
Eosinophils Relative: 1.9 % (ref 0.0–5.0)
HCT: 39.2 % (ref 36.0–46.0)
Hemoglobin: 12.6 g/dL (ref 12.0–15.0)
Lymphocytes Relative: 22.9 % (ref 12.0–46.0)
Lymphs Abs: 1.2 10*3/uL (ref 0.7–4.0)
MCHC: 32 g/dL (ref 30.0–36.0)
MCV: 77.3 fl — ABNORMAL LOW (ref 78.0–100.0)
Monocytes Absolute: 0.5 10*3/uL (ref 0.1–1.0)
Monocytes Relative: 8.9 % (ref 3.0–12.0)
Neutro Abs: 3.6 10*3/uL (ref 1.4–7.7)
Neutrophils Relative %: 65.5 % (ref 43.0–77.0)
Platelets: 196 10*3/uL (ref 150.0–400.0)
RBC: 5.07 Mil/uL (ref 3.87–5.11)
RDW: 13.8 % (ref 11.5–14.6)
WBC: 5.4 10*3/uL (ref 4.5–10.5)

## 2010-08-28 LAB — URINALYSIS, ROUTINE W REFLEX MICROSCOPIC
Bilirubin Urine: NEGATIVE
Ketones, ur: NEGATIVE
Nitrite: NEGATIVE
Specific Gravity, Urine: 1.025 (ref 1.000–1.030)
Total Protein, Urine: NEGATIVE
Urine Glucose: NEGATIVE
Urobilinogen, UA: 0.2 (ref 0.0–1.0)
pH: 5.5 (ref 5.0–8.0)

## 2010-08-28 LAB — RENAL FUNCTION PANEL
Albumin: 3.6 g/dL (ref 3.5–5.2)
BUN: 20 mg/dL (ref 6–23)
CO2: 28 mEq/L (ref 19–32)
Calcium: 9.3 mg/dL (ref 8.4–10.5)
Chloride: 106 mEq/L (ref 96–112)
Creatinine, Ser: 1.4 mg/dL — ABNORMAL HIGH (ref 0.4–1.2)
GFR: 44.85 mL/min — ABNORMAL LOW (ref >60.00)
Glucose, Bld: 107 mg/dL — ABNORMAL HIGH (ref 70–99)
Phosphorus: 3.4 mg/dL (ref 2.3–4.6)
Potassium: 4.2 mEq/L (ref 3.5–5.1)
Sodium: 140 mEq/L (ref 135–145)

## 2010-08-28 LAB — HEPATIC FUNCTION PANEL
ALT: 17 U/L (ref 0–35)
AST: 19 U/L (ref 0–37)
Albumin: 3.6 g/dL (ref 3.5–5.2)
Alkaline Phosphatase: 78 U/L (ref 39–117)
Bilirubin, Direct: 0.1 mg/dL (ref 0.0–0.3)
Total Bilirubin: 0.7 mg/dL (ref 0.3–1.2)
Total Protein: 7.2 g/dL (ref 6.0–8.3)

## 2010-08-28 LAB — BASIC METABOLIC PANEL
BUN: 20 mg/dL (ref 6–23)
CO2: 28 mEq/L (ref 19–32)
Calcium: 9.3 mg/dL (ref 8.4–10.5)
Chloride: 106 mEq/L (ref 96–112)
Creatinine, Ser: 1.4 mg/dL — ABNORMAL HIGH (ref 0.4–1.2)
GFR: 44.85 mL/min — ABNORMAL LOW (ref >60.00)
Glucose, Bld: 107 mg/dL — ABNORMAL HIGH (ref 70–99)
Potassium: 4.2 mEq/L (ref 3.5–5.1)
Sodium: 140 mEq/L (ref 135–145)

## 2010-08-28 LAB — MICROALBUMIN / CREATININE URINE RATIO
Creatinine,U: 239.4 mg/dL
Microalb Creat Ratio: 0.6 mg/g (ref 0.0–30.0)
Microalb, Ur: 1.5 mg/dL (ref 0.0–1.9)

## 2010-08-28 LAB — LIPID PANEL
Cholesterol: 163 mg/dL (ref 0–200)
HDL: 51.2 mg/dL (ref >39.00)
LDL Cholesterol: 94 mg/dL (ref 0–99)
Total CHOL/HDL Ratio: 3
Triglycerides: 90 mg/dL (ref 0.0–149.0)
VLDL: 18 mg/dL (ref 0.0–40.0)

## 2010-08-28 LAB — HEMOGLOBIN A1C: Hgb A1c MFr Bld: 6.7 % — ABNORMAL HIGH (ref 4.6–6.5)

## 2010-08-28 LAB — TSH: TSH: 1.68 u[IU]/mL (ref 0.35–5.50)

## 2010-08-29 ENCOUNTER — Telehealth: Payer: Self-pay | Admitting: Internal Medicine

## 2010-09-01 ENCOUNTER — Encounter: Payer: Self-pay | Admitting: Orthopedic Surgery

## 2010-09-11 ENCOUNTER — Other Ambulatory Visit: Payer: Self-pay | Admitting: Adult Health

## 2010-09-11 ENCOUNTER — Encounter: Payer: Self-pay | Admitting: Adult Health

## 2010-09-11 ENCOUNTER — Ambulatory Visit
Admission: RE | Admit: 2010-09-11 | Discharge: 2010-09-11 | Payer: Self-pay | Source: Home / Self Care | Attending: Internal Medicine | Admitting: Internal Medicine

## 2010-09-11 DIAGNOSIS — Z87448 Personal history of other diseases of urinary system: Secondary | ICD-10-CM | POA: Insufficient documentation

## 2010-09-11 LAB — URINALYSIS, ROUTINE W REFLEX MICROSCOPIC
Ketones, ur: NEGATIVE
Leukocytes, UA: NEGATIVE
Nitrite: NEGATIVE
Specific Gravity, Urine: 1.025 (ref 1.000–1.030)
Urobilinogen, UA: 0.2 (ref 0.0–1.0)
pH: 5.5 (ref 5.0–8.0)

## 2010-09-11 NOTE — Progress Notes (Signed)
Summary: FYI  Phone Note Call from Patient   Caller: Patient Call For: Allison Rodgers P Reason for Call: Talk to Nurse Summary of Call: 05/04/2010 @ 11:00am pt goes to [employer]. Initial call taken by: Eugene Gavia,  March 01, 2010 11:32 AM  Follow-up for Phone Call        message forwarded to TP to make her aware. Randell Loop [facility]  March 01, 2010 11:35 AM

## 2010-09-11 NOTE — Miscellaneous (Signed)
Summary: Orders Update  Clinical Lists Changes  Orders: Added new Test order of Venous Duplex Lower Extremity (Venous Duplex Lower) - Signed 

## 2010-09-11 NOTE — Assessment & Plan Note (Signed)
Summary: NP follow up - DM, edema   Primary Provider/Referring Provider:  Sandrea Hughs, MD  CC:  3 month follow up - states BS have been "up and down" ranging from 85-200.  pt is fasting this morning. Marland Kitchen  History of Present Illness: 77 yobf with morbid obesity complicated by DM, HTN and Dyslipidemia and chronic venous insufficiency with dependent edema.  August 10, 2008--ov c/o of low back pain on right x 1 week in setting of  known history of intermittent back pain for last 30 years per pt. Previous  L5 radiculapathy confirmed by [facility] 08/2005.   Marland Kitchen Prev hx of DVT x 2 >15 yrs ago--preliminary venous doppler neg. for DVT, ruptured baker's cyst.   August 24, 2008 --returns for follow up. low back pain and knee only minimally improved, rec sulindac and referred to ortho >  [name]  October 21, 2008  cannot tolerate sulindac due to GI upset so control pain with darvocet twice daily, has vicodin but not using.    Dec 13, 2008 cc new lump right leg x 2 weeks red and hot no trauma or bite or sign increase in swelling, no fever or increase in chronic arthritis symptoms.  rx heat, elevation, no change in rx  January 20, 2009 c/o leg swelling and pain worse, back pain no worse, better with elevation, better with as needed lasix. -Rx Lasix increased to 2 each am and 1-2 as needed.    March 17, 2009 ov leg swelling no better, worse area of pain, swelling heat mid right shin. swelling better when elevates.  rec avelox x 10 days, increase spironolactone, and see ortho - did only 1/3 because could not take avelox nausea and did not know [name]. Lost med calendar  April 26, 2009 ov c/o leg swelling only better when elevates the leg, no improvement painful rash over shin.  took doxy instead of avelox.   June 07, 2009  ov cc  leg is doing much better- not swelling anymore.  She c/o "feeling nervous all the time".    no sob, orthopnea August 21, 2009 3 month followup.  Alprazolam has helped with  nervousness.   better on lasix/ adlactone.  no overt low or high sugar symptoms.  November 20, 2009--Presents for follow up and med review. Doing well since last visit. She brought all her meds today and we updated her med list. Labs in January showed DM control was unchanged at 6.7. Kidney fnx was also unchanged at 1.5.>>-Add Aspirin 81mg  daily, and  Amary 2mg  1/2 once daily w/ meal.   February 19, 2010--Presents for 3 month follow up - states BS have been "up and down" ranging from 85-200.  pt is fasting this morning. Pt has chronic edema , wear TEDS during daytime which helps. She is doing well, trying to watch what she is eating, lost 4 lbs since seen 3 months ago. no hypoglycemics episodes. Denies chest pain, dyspnea, orthopnea, hemoptysis, fever, n/v/d, edema, headache, polydipsia, polyuria.  Has upcoming repeat mammogram for abn asymmetry suspected to be benign earlier this year.   Preventive Screening-Counseling & Management  Alcohol-Tobacco     Smoking Status: never  Medications Prior to Update: 1)  Nexium 40 Mg  Cpdr (Esomeprazole Magnesium) .... Take 1 Capsule By Mouth Once Daily Before Meal 2)  Nasonex 50 Mcg/act  Susp (Mometasone Furoate) .Marland Kitchen.. 1-2 Spray Each Nostril Two Times A Day 3)  Spironolactone 50 Mg  Tabs (Spironolactone) .... Take 2 By Mouth  in The Morning and 2  At Lunch 4)  Furosemide 40 Mg  Tabs (Furosemide) .... Take 2  By Mouth Once Daily 5)  Lantus Solostar 100 Unit/ml  Soln (Insulin Glargine) .... 30  Units At Bedtime 6)  Amaryl 2 Mg  Tabs (Glimepiride) .... Take 1/2 Tab By Mouth Once Daily With Breakfast For Diabetes 7)  Citrucel   Powd (Methylcellulose (Laxative)) .... Take 1 Tbsp  Every Morning 8)  Iron 325 (65 Fe) Mg Tabs (Ferrous Sulfate) .Marland Kitchen.. 1 Once Daily 9)  Lumigan 0.03 %  Soln (Bimatoprost) .Marland Kitchen.. 1 Drop in Each Eye At Bedtime 10)  Calcium Carbonate-Vitamin D 600-400 Mg-Unit  Tabs (Calcium Carbonate-Vitamin D) .... Two Times A Day 11)  Aspir-Low 81 Mg Tbec  (Aspirin) .Marland Kitchen.. 1 By Mouth Once Daily 12)  Furosemide 40 Mg  Tabs (Furosemide) .... One Extra Once A Day As Needed For Swelling. 13)  Tylenol Ex St Arthritis Pain 500 Mg  Tabs (Acetaminophen) .... Per Bottle As Needed 14)  Tramadol Hcl 50 Mg Tabs (Tramadol Hcl) .Marland Kitchen.. 1 Tab By Mouth Every 4 Hours As Needed Pain 15)  Kaopectate 750 Mg/39ml  Liqd (Attapulgite) .... Per Bottle 16)  Fluocinonide 0.05 % Crea (Fluocinonide) .... Apply Two Times A Day As Needed 17)  Alprazolam 0.25 Mg Tabs (Alprazolam) .... One Every 6 Hours If Needed For Neves  Current Medications (verified): 1)  Nexium 40 Mg  Cpdr (Esomeprazole Magnesium) .... Take 1 Capsule By Mouth Once Daily Before Meal 2)  Nasonex 50 Mcg/act  Susp (Mometasone Furoate) .Marland Kitchen.. 1-2 Spray Each Nostril Two Times A Day 3)  Spironolactone 50 Mg  Tabs (Spironolactone) .... Take 2 By Mouth in The Morning and 2  At Lunch 4)  Furosemide 40 Mg  Tabs (Furosemide) .... Take 2  By Mouth Once Daily 5)  Lantus Solostar 100 Unit/ml  Soln (Insulin Glargine) .... 30  Units At Bedtime 6)  Amaryl 2 Mg  Tabs (Glimepiride) .... Take 1/2 Tab By Mouth Once Daily With Breakfast For Diabetes 7)  Citrucel   Powd (Methylcellulose (Laxative)) .... Take 1 Tbsp  Every Morning 8)  Iron 325 (65 Fe) Mg Tabs (Ferrous Sulfate) .Marland Kitchen.. 1 Once Daily 9)  Lumigan 0.03 %  Soln (Bimatoprost) .Marland Kitchen.. 1 Drop in Each Eye At Bedtime 10)  Calcium Carbonate-Vitamin D 600-400 Mg-Unit  Tabs (Calcium Carbonate-Vitamin D) .... Two Times A Day 11)  Aspir-Low 81 Mg Tbec (Aspirin) .Marland Kitchen.. 1 By Mouth Once Daily 12)  Furosemide 40 Mg  Tabs (Furosemide) .... One Extra Once A Day As Needed For Swelling. 13)  Tylenol Ex St Arthritis Pain 500 Mg  Tabs (Acetaminophen) .... Per Bottle As Needed 14)  Tramadol Hcl 50 Mg Tabs (Tramadol Hcl) .Marland Kitchen.. 1 Tab By Mouth Every 4 Hours As Needed Pain 15)  Kaopectate 750 Mg/83ml  Liqd (Attapulgite) .... Per Bottle 16)  Fluocinonide 0.05 % Crea (Fluocinonide) .... Apply Two  Times A Day As Needed 17)  Alprazolam 0.25 Mg Tabs (Alprazolam) .... One Every 6 Hours If Needed For Neves  Allergies (verified): 1)  Pcn 2)  Bactrim  Past History:  Past Surgical History: Last updated: 01/20/2009 Appendectomy Cholecystectomy 1999 Hysterectomy and bilateral oophorectomy 1957 umbilical and ventral hernia repair by Dr. Jamey Ripa 08-2005 R rotator cuff 7/05  Family History: Last updated: 03/22/2008 asthma in her brother who also has hypertension mother died of stroke, also had diabetes and arthritis father had multiple myeloma 4 siblings all in relatively good health  Social History: Last updated:  02/19/2010 Patient never smoked.  positive for second-hand smoke exposure exercises 2-3 times a week 1 cup caffeine a day married 1 child  Risk Factors: Smoking Status: never (07/23/2007)  Past Medical History: GERD (ICD-530.81) ABDOMINAL PAIN, CHRONIC (ICD-789.00) FATIGUE, CHRONIC (ICD-780.79) DEGENERATIVE JOINT DISEASE (ICD-715.90)      - L5 radiculopathy confirmed by mri 08/2005      --refer to ortho August 24, 2008 >>>  Dr  HYPERTENSIVE CARDIOVASCULAR DISEASE (ICD-402.90) OBESITY (ICD-278.00)     - Target wt  =   158  for BMI < 30      - Referred [facility] 04/2005 GASTRITIS, CHRONIC (ICD-535.10) BENIGN NEOPLASM OTH&UNSPEC SITE DIGESTIVE SYSTEM (ICD-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 abn asymmetry >>repeat in 6 months  MICROCYTIC ANEMA     -  DX 02/03/09,  Stool g neg x 6, rx  Fe 65 mg /day >  improving March 17, 2009    Edema--08/10/08--venous  doppler neg for dvt, ?ruptured bakers cyst-right               March 20, 2009 repeat venous doppler > neg bilaterally     Social History: Patient never smoked.  positive for second-hand smoke exposure exercises 2-3 times a week 1 cup caffeine a day married 1 child  Review of Systems      See HPI  Vital Signs:  Patient profile:   77 year old female Height:      60 inches Weight:      196 pounds BMI:     38.42 O2 Sat:      98 % on Room air Temp:     97.6 degrees F oral Pulse rate:   85 / minute BP sitting:   134 / 82  (left arm) Cuff size:   large  Vitals Entered By: Boone Master CNA/MA (February 19, 2010 9:29 AM)  O2 Flow:  Room air CC: 3 month follow up - states BS have been "up and down" ranging from 85-200.  pt is fasting this morning.  Is Patient Diabetic? Yes Comments Medications reviewed with patient Daytime contact number verified with patient. Boone Master CNA/MA  February 19, 2010 9:29 AM    Physical Exam  Additional Exam:  obese ambulatory  bf nad.   wt 213 10/21/08 > 205 Dec 13, 2008 >>206 February 03, 2009 >  211 March 17, 2009 > 204 April 26, 2009 > 198 June 07, 2009 > 201>200 November 20, 2009 > 196 February 19, 2010  Afeb with normal vital signs HEENT: nl dentition, turbinates, and orophanx. Nl external ear canals without cough reflex Neck without JVD/Nodes/TM Lungs clear to A and P bilaterally without cough on insp or exp maneuvers RRR no s3 or murmur or increase in P2, trace edema only sym bilaterally Abd soft and benign with nl excursion in the supine position. No bruits or organomegaly EXT : chronic edema, venous insufficiency changes w/ stasis dermatic changes.    Impression & Recommendations:  Problem # 1:  DM (ICD-250.00) Improving control , cont on same meds diet ed given.  Her updated medication list  for this problem includes:    Lantus Solostar 100 Unit/ml Soln (Insulin glargine) .Marland KitchenMarland KitchenMarland KitchenMarland Kitchen 30  units at bedtime    Amaryl 2 Mg Tabs (Glimepiride)  .Marland Kitchen... Take 1/2 tab by mouth once daily with breakfast for diabetes    Aspir-low 81 Mg Tbec (Aspirin) .Marland Kitchen... 1 by mouth once daily  Orders: TLB-BMP (Basic Metabolic Panel-BMET) (80048-METABOL) TLB-A1C / Hgb A1C (Glycohemoglobin) (83036-A1C) Est. Patient Level IV (16109)  Problem # 2:  EDEMA (ICD-782.3)  chronic venous insufficiency , advised on low salt diet and support hose.   Orders: Est. Patient Level IV (60454)  Problem # 3:  HYPERTENSION (ICD-401.9) controlled Her updated medication list for this problem includes:    Spironolactone 50 Mg Tabs (Spironolactone) .Marland Kitchen... Take 2 by mouth in the morning and 2  at lunch    Furosemide 40 Mg Tabs (Furosemide) .Marland Kitchen... Take 2  by mouth once daily    Furosemide 40 Mg Tabs (Furosemide) ..... One extra once a day as needed for swelling.  Orders: Est. Patient Level IV (99214)  BP today: 134/82 Prior BP: 142/80 (11/20/2009)  Prior 10 Yr Risk Heart Disease: 17 % (02/03/2009)  Labs Reviewed: K+: 4.0 (08/21/2009) Creat: : 1.5 (08/21/2009)   Chol: 162 (01/20/2009)   HDL: 51.90 (01/20/2009)   LDL: 92 (01/20/2009)   TG: 89.0 (01/20/2009)  Problem # 4:  RENAL FAILURE, CHRONIC (ICD-585.9) bmet pending avoid nsaids if possible.  Orders: Est. Patient Level IV (09811)  Complete Medication List: 1)  Nexium 40 Mg Cpdr (Esomeprazole magnesium) .... Take 1 capsule by mouth once daily before meal 2)  Nasonex 50 Mcg/act Susp (Mometasone furoate) .Marland Kitchen.. 1-2 spray each nostril two times a day 3)  Spironolactone 50 Mg Tabs (Spironolactone) .... Take 2 by mouth in the morning and 2  at lunch 4)  Furosemide 40 Mg Tabs (Furosemide) .... Take 2  by mouth once daily 5)  Lantus Solostar 100 Unit/ml Soln (Insulin glargine) .... 30  units at bedtime 6)  Amaryl 2 Mg Tabs (Glimepiride) .... Take 1/2 tab by mouth once daily with breakfast for diabetes 7)  Citrucel Powd (Methylcellulose (laxative)) .... Take 1 tbsp  every morning 8)  Iron 325 (65 Fe) Mg Tabs  (Ferrous sulfate) .Marland Kitchen.. 1 once daily 9)  Lumigan 0.03 % Soln (Bimatoprost) .Marland Kitchen.. 1 drop in each eye at bedtime 10)  Calcium Carbonate-vitamin D 600-400 Mg-unit Tabs (Calcium carbonate-vitamin d) .... Two times a day 11)  Aspir-low 81 Mg Tbec (Aspirin) .Marland Kitchen.. 1 by mouth once daily 12)  Furosemide 40 Mg Tabs (Furosemide) .... One extra once a day as needed for swelling. 13)  Tylenol Ex St Arthritis Pain 500 Mg Tabs (Acetaminophen) .... Per bottle as needed 14)  Tramadol Hcl 50 Mg Tabs (Tramadol hcl) .Marland Kitchen.. 1 tab by mouth every 4 hours as needed pain 15)  Kaopectate 750 Mg/34ml Liqd (Attapulgite) .... Per bottle 16)  Fluocinonide 0.05 % Crea (Fluocinonide) .... Apply two times a day as needed 17)  Alprazolam 0.25 Mg Tabs (Alprazolam) .... One every 6 hours if needed for neves  Patient Instructions: 1)  Continue on same meds.  2)  Low salt diet, keep legs elevated, support hose duirng daytime.  3)  Keep sweets low.  4)  Keep upcoming mammorgram appointment.  5)  Look into shingles vaccine at health department.  6)  follow up Dr. [name] in 3 monhts for physical.  Prescriptions: NEXIUM 40 MG  CPDR (ESOMEPRAZOLE MAGNESIUM) take 1 capsule by mouth once daily before meal  #  60 x 5   Entered and Authorized by:   Rubye Oaks NP   Signed by:   Tammy Parrett NP on 02/19/2010   Method used:   Print then Give to Patient   RxID:   [id number] LUMIGAN 0.03 %  SOLN (BIMATOPROST) 1 drop in each eye At bedtime  #1 x 5   Entered and Authorized by:   Rubye Oaks NP   Signed by:   Tammy Parrett NP on 02/19/2010   Method used:   Print then Give to Patient   RxID:   [id number]

## 2010-09-11 NOTE — Assessment & Plan Note (Signed)
Summary: NP follow up - med calendar   Primary Provider/Referring Provider:  Sandrea Hughs, MD  CC:  est med calendar - pt does have concerns about her BS and stating that it spikes and drops x38month.  History of Present Illness: 79 yobf with morbid obesity complicated by DM, HTN and Dyslipidemia and chronic venous insufficiency with dependent edema.  August 10, 2008--ov c/o of low back pain on right x 1 week in setting of  known history of intermittent back pain for last 30 years per pt. Previous  L5 radiculapathy confirmed by [facility] 08/2005.  Pain is dull ache to mod. pain at time , and pain down into right leg. Has had a knot behind right knee for 1 week prior to ov, was bigger but over last 2 days, decreased in size. Prev hx of DVT x 2 >15 yrs ago--preliminary venous doppler neg. for DVT, ruptured baker's cyst.   August 24, 2008 --returns for follow up. low back pain and knee only minimally improved, rec sulindac and referred to ortho >  [name]  October 21, 2008  cannot tolerate sulindac due to GI upset so control pain with darvocet twice daily, has vicodin but not using.    Dec 13, 2008 cc new lump right leg x 2 weeks red and hot no trauma or bite or sign increase in swelling, no fever or increase in chronic arthritis symptoms.  rx heat, elevation, no change in rx  January 20, 2009 c/o leg swelling and pain worse, back pain no worse, better with elevation, better with as needed lasix. -Rx Lasix increased to 2 each am and 1-2 as needed.    March 17, 2009 ov leg swelling no better, worse area of pain, swelling heat mid right shin. swelling better when elevates.  rec avelox x 10 days, increase spironolactone, and see ortho - did only 1/3 because could not take avelox nausea and did not know [name]. Lost med calendar   April 26, 2009 ov c/o leg swelling only better when elevates the leg, no improvement painful rash over shin.  took doxy instead of avelox.   June 07, 2009  ov cc  leg is  doing much better- not swelling anymore.  She c/o "feeling nervous all the time".    no sob, orthopnea August 21, 2009 3 month followup.  Alprazolam has helped with nervousness.   better on lasix/ adlactone.  no overt low or high sugar symptoms.  November 20, 2009--Presents for follow up and med review. Doing well since last visit. She brought all her meds today and we updated her med list. Labs in January showed DM control was unchanged at 6.7. Kidney fnx was also unchanged at 1.5. We did discuss several dietary choices to help her w/ variety in her diet for DM. Leg swelling is unchanged. Denies chest pain, dyspnea, orthopnea, hemoptysis, fever, n/v/d, increased edema, headache.  Medications Prior to Update: 1)  Nexium 40 Mg  Cpdr (Esomeprazole Magnesium) .... Take 1 Capsule By Mouth Two Times A Day 2)  Nasonex 50 Mcg/act  Susp (Mometasone Furoate) .Marland Kitchen.. 1-2 Spray Each Nostril Two Times A Day 3)  Spironolactone 50 Mg  Tabs (Spironolactone) .... Take 2 By Mouth in The Morning and 2  At Lunch 4)  Furosemide 40 Mg  Tabs (Furosemide) .... Take 2  By Mouth Once Daily 5)  Lantus Solostar 100 Unit/ml  Soln (Insulin Glargine) .... 30  Units At Bedtime 6)  Amaryl 2 Mg  Tabs (Glimepiride) .Marland KitchenMarland KitchenMarland Kitchen  Take 1 Tab By Mouth Once Daily With Meals. 7)  Citrucel   Powd (Methylcellulose (Laxative)) .... Take 1 Tbsp  Every Morning 8)  Lumigan 0.03 %  Soln (Bimatoprost) .Marland Kitchen.. 1 Drop in Each Eye At Bedtime 9)  Calcium Carbonate-Vitamin D 600-400 Mg-Unit  Tabs (Calcium Carbonate-Vitamin D) .... Two Times A Day 10)  Furosemide 40 Mg  Tabs (Furosemide) .... One Extra Once A Day As Needed For Swelling. 11)  Tylenol Ex St Arthritis Pain 500 Mg  Tabs (Acetaminophen) .... Per Bottle As Needed, Do Not Take With Darvocet or Vicodin 12)  Tramadol Hcl 50 Mg Tabs (Tramadol Hcl) .Marland Kitchen.. 1-2 Tabs By Mouth Every 4-6 Hours As Needed Pain 13)  Kaopectate 750 Mg/72ml  Liqd (Attapulgite) .... Per Bottle 14)  Iron 325 (65 Fe) Mg Tabs (Ferrous  Sulfate) .Marland Kitchen.. 1 Once Daily 15)  Alprazolam 0.25 Mg Tabs (Alprazolam) .... One Every 6 Hours If Needed For Neves  Allergies (verified): 1)  Pcn 2)  Bactrim  Past History:  Family History: Last updated: 03/22/2008 asthma in her brother who also has hypertension mother died of stroke, also had diabetes and arthritis father had multiple myeloma 4 siblings all in relatively good health  Social History: Last updated: 03/22/2008 Patient never smoked.  positive for second-hand smoke exposure exercises 2-3 times a week 1 cup caffeine a day married 1 child  Risk Factors: Smoking Status: never (07/23/2007)  Vital Signs:  Patient profile:   77 year old female Height:      60 inches Weight:      200.50 pounds O2 Sat:      98 % on Room air Temp:     97.5 degrees F oral Pulse rate:   87 / minute BP sitting:   142 / 80  (left arm) Cuff size:   large  Vitals Entered By: Boone Master CNA (November 20, 2009 9:03 AM)  O2 Flow:  Room air CC: est med calendar - pt does have concerns about her BS, stating that it spikes and drops x85month Is Patient Diabetic? Yes Comments Medications reviewed with patient Daytime contact number verified with patient. Boone Master CNA  November 20, 2009 8:54 AM    Physical Exam  Additional Exam:  obese ambulatory  bf nad.   wt 213 10/21/08 > 205 Dec 13, 2008 >>206 February 03, 2009 >  211 March 17, 2009 > 204 April 26, 2009 > 198 June 07, 2009 > 201>200 November 20, 2009  Afeb with normal vital signs HEENT: nl dentition, turbinates, and orophanx. Nl external ear canals without cough reflex Neck without JVD/Nodes/TM Lungs clear to A and P bilaterally without cough on insp or exp maneuvers RRR no s3 or murmur or increase in P2, trace edema only sym bilaterally Abd soft and benign with nl excursion in the supine position. No bruits or organomegaly EXT : chronic edema, venous insufficiency changes w/ stasis dermatic changes.    Impression &  Recommendations:  Problem # 1:  DM (ICD-250.00)  Compensated on present regimen.  Previous Amaryl was changed back to 1 tab daily on med list however has had some low sugars  ~80-90 does not feel good on this will change back to 1/2 tab once daily  Meds reviewed with pt education and computerized med calendar completed/adjusted.    Her updated medication list for this problem includes:    Lantus Solostar 100 Unit/ml Soln (Insulin glargine) .Marland KitchenMarland KitchenMarland KitchenMarland Kitchen 30  units at bedtime    Amaryl 2 Mg Tabs (  Glimepiride) .Marland Kitchen... Take 1/2 tab by mouth once daily with meals for diabetes    Aspir-low 81 Mg Tbec (Aspirin) .Marland Kitchen... 1 by mouth once daily  Orders: Est. Patient Level III (78295)  Problem # 2:  HYPERTENSION (ICD-401.9)  controlled on rx  Her updated medication list for this problem includes:    Spironolactone 50 Mg Tabs (Spironolactone) .Marland Kitchen... Take 2 by mouth in the morning and 2  at lunch    Furosemide 40 Mg Tabs (Furosemide) .Marland Kitchen... Take 2  by mouth once daily    Furosemide 40 Mg Tabs (Furosemide) ..... One extra once a day as needed for swelling.  Orders: Est. Patient Level III (62130)  Problem # 3:  EDEMA (ICD-782.3)  low salt diet  keep legs elevated   Orders: Est. Patient Level III (86578)  Medications Added to Medication List This Visit: 1)  Amaryl 2 Mg Tabs (Glimepiride) .... Take 1/2 tab by mouth once daily with meals for diabetes 2)  Aspir-low 81 Mg Tbec (Aspirin) .Marland Kitchen.. 1 by mouth once daily  Complete Medication List: 1)  Nexium 40 Mg Cpdr (Esomeprazole magnesium) .... Take 1 capsule by mouth two times a day 2)  Nasonex 50 Mcg/act Susp (Mometasone furoate) .Marland Kitchen.. 1-2 spray each nostril two times a day 3)  Spironolactone 50 Mg Tabs (Spironolactone) .... Take 2 by mouth in the morning and 2  at lunch 4)  Furosemide 40 Mg Tabs (Furosemide) .... Take 2  by mouth once daily 5)  Lantus Solostar 100 Unit/ml Soln (Insulin glargine) .... 30  units at bedtime 6)  Amaryl 2 Mg Tabs (Glimepiride)  .... Take 1/2 tab by mouth once daily with meals for diabetes 7)  Citrucel Powd (Methylcellulose (laxative)) .... Take 1 tbsp  every morning 8)  Lumigan 0.03 % Soln (Bimatoprost) .Marland Kitchen.. 1 drop in each eye at bedtime 9)  Calcium Carbonate-vitamin D 600-400 Mg-unit Tabs (Calcium carbonate-vitamin d) .... Two times a day 10)  Furosemide 40 Mg Tabs (Furosemide) .... One extra once a day as needed for swelling. 11)  Tylenol Ex St Arthritis Pain 500 Mg Tabs (Acetaminophen) .... Per bottle as needed, do not take with darvocet or vicodin 12)  Tramadol Hcl 50 Mg Tabs (Tramadol hcl) .Marland Kitchen.. 1-2 tabs by mouth every 4-6 hours as needed pain 13)  Kaopectate 750 Mg/71ml Liqd (Attapulgite) .... Per bottle 14)  Iron 325 (65 Fe) Mg Tabs (Ferrous sulfate) .Marland Kitchen.. 1 once daily 15)  Alprazolam 0.25 Mg Tabs (Alprazolam) .... One every 6 hours if needed for neves 16)  Aspir-low 81 Mg Tbec (Aspirin) .Marland Kitchen.. 1 by mouth once daily  Patient Instructions: 1)  Add Aspirin 81mg  once daily  2)  Change back to Amary 2mg  1/2 once daily w/ meal.  3)  Follow med calendar closely and bring to each visit.  4)  follow up NP Parrett in 3-4 months w/ fasting labs. and as needed  Prescriptions: AMARYL 2 MG  TABS (GLIMEPIRIDE) take 1/2 tab by mouth once daily with meals for Diabetes  #30 x 11   Entered and Authorized by:   Rubye Oaks NP   Signed by:   Tammy Parrett NP on 11/20/2009   Method used:   Print then Give to Patient   RxID:   [id number]   Appended Document: med calendar update Medications Added NEXIUM 40 MG  CPDR (ESOMEPRAZOLE MAGNESIUM) take 1 capsule by mouth once daily before meal AMARYL 2 MG  TABS (GLIMEPIRIDE) take 1/2 tab by mouth once daily with breakfast for  Diabetes TYLENOL EX ST ARTHRITIS PAIN 500 MG  TABS (ACETAMINOPHEN) per bottle as needed TRAMADOL HCL 50 MG TABS (TRAMADOL HCL) 1 tab by mouth every 4 hours as needed pain FLUOCINONIDE 0.05 % CREA (FLUOCINONIDE) apply two times a day as needed           Clinical Lists Changes  Medications: Changed medication from NEXIUM 40 MG  CPDR (ESOMEPRAZOLE MAGNESIUM) Take 1 capsule by mouth two times a day to NEXIUM 40 MG  CPDR (ESOMEPRAZOLE MAGNESIUM) take 1 capsule by mouth once daily before meal Changed medication from AMARYL 2 MG  TABS (GLIMEPIRIDE) take 1/2 tab by mouth once daily with meals for Diabetes to AMARYL 2 MG  TABS (GLIMEPIRIDE) take 1/2 tab by mouth once daily with breakfast for Diabetes Changed medication from TYLENOL EX ST ARTHRITIS PAIN 500 MG  TABS (ACETAMINOPHEN) per bottle as needed, do not take with darvocet or vicodin to TYLENOL EX ST ARTHRITIS PAIN 500 MG  TABS (ACETAMINOPHEN) per bottle as needed Changed medication from TRAMADOL HCL 50 MG TABS (TRAMADOL HCL) 1-2 tabs by mouth every 4-6 hours as needed pain to TRAMADOL HCL 50 MG TABS (TRAMADOL HCL) 1 tab by mouth every 4 hours as needed pain Added new medication of FLUOCINONIDE 0.05 % CREA (FLUOCINONIDE) apply two times a day as needed

## 2010-09-11 NOTE — Assessment & Plan Note (Signed)
Summary: Primary svc/ multiple issues   Primary Provider/Referring Provider:  Sandrea Hughs, MD  CC:  3 month followup.  Alprazolam has helped with nervousness.  Pt denies any new complaints today.Allison Rodgers  History of Present Illness: 15 yobf with morbid obesity complicated by  DM, HTN and Dyslipidemia and chronic venous insufficiency with dependent edema.  August 10, 2008--ov c/o of low back pain on right x 1 week in setting of  known history of intermittent back pain for last 30 years per pt. Previous  L5 radiculapathy confirmed by [facility] 08/2005.  Pain is dull ache to mod. pain at time , and pain down into right leg. Has had a knot behind right knee for 1 week prior to ov, was bigger but over last 2 days, decreased in size. Prev hx of DVT x 2 >15 yrs ago--preliminary venous doppler neg. for DVT, ruptured baker's cyst.   August 24, 2008 --returns for follow up. low back pain and knee only minimally improved, rec sulindac and referred to ortho >  [name]  October 21, 2008  cannot tolerate sulindac due to GI upset so control pain with darvocet twice daily, has vicodin but not using.    Dec 13, 2008 cc new lump right leg x 2 weeks red and hot no trauma or bite or sign increase in swelling, no fever or increase in chronic arthritis symptoms.  rx heat, elevation, no change in rx  January 20, 2009 c/o leg swelling and pain worse, back pain no worse, better with elevation, better with as needed lasix. -Rx Lasix increased to 2 each am and 1-2 as needed.    March 17, 2009 ov leg swelling no better, worse area of pain, swelling heat mid right shin. swelling better when elevates.  rec avelox x 10 days, increase spironolactone, and see ortho - did only 1/3 because could not take avelox nausea and did not know [name]. Lost med calendar   April 26, 2009 ov c/o leg swelling only better when elevates the leg, no improvement painful rash over shin.  took doxy instead of avelox.   June 07, 2009  ov cc  leg is  doing much better- not swelling anymore.  She c/o "feeling nervous all the time".    no sob, orthopneaJanuary 10, 2011 3 month followup.  Alprazolam has helped with nervousness.  Pt denies any new complaints today here for f/u hbp, dm/ chronic edema with venous stasis changes rle.  Pt denies any significant sore throat, dysphagia, itching, sneezing,  nasal congestion or excess secretions,  fever, chills, sweats, unintended wt loss, pleuritic or exertional cp, hempoptysis, change in activity tolerance  orthopnea pnd or change in pattern of leg swelling, better on lasix/ adlactone.  no overt low or high sugar symptoms.  Current Medications (verified): 1)  Nexium 40 Mg  Cpdr (Esomeprazole Magnesium) .... Take 1 Capsule By Mouth Two Times A Day 2)  Nasonex 50 Mcg/act  Susp (Mometasone Furoate) .Allison Rodgers.. 1-2 Spray Each Nostril Two Times A Day 3)  Spironolactone 50 Mg  Tabs (Spironolactone) .... Take 2 By Mouth in The Morning and 2  At Lunch 4)  Furosemide 40 Mg  Tabs (Furosemide) .... Take 2  By Mouth Once Daily 5)  Lantus Solostar 100 Unit/ml  Soln (Insulin Glargine) .... 30  Units At Bedtime 6)  Amaryl 2 Mg  Tabs (Glimepiride) .... Take 1 Tab By Mouth Once Daily With Meals. 7)  Citrucel   Powd (Methylcellulose (Laxative)) .... Take 1 Tbsp  Every Morning 8)  Lumigan 0.03 %  Soln (Bimatoprost) .Allison Rodgers.. 1 Drop in Each Eye At Bedtime 9)  Calcium Carbonate-Vitamin D 600-400 Mg-Unit  Tabs (Calcium Carbonate-Vitamin D) .... Two Times A Day 10)  Furosemide 40 Mg  Tabs (Furosemide) .... One Extra Once A Day As Needed For Swelling. 11)  Tylenol Ex St Arthritis Pain 500 Mg  Tabs (Acetaminophen) .... Per Bottle As Needed, Do Not Take With Darvocet or Vicodin 12)  Tramadol Hcl 50 Mg Tabs (Tramadol Hcl) .Allison Rodgers.. 1-2 Tabs By Mouth Every 4-6 Hours As Needed Pain 13)  Kaopectate 750 Mg/21ml  Liqd (Attapulgite) .... Per Bottle 14)  Iron 325 (65 Fe) Mg Tabs (Ferrous Sulfate) .Allison Rodgers.. 1 Once Daily 15)  Alprazolam 0.25 Mg Tabs (Alprazolam)  .... One Every 6 Hours If Needed For Neves  Allergies (verified): 1)  Pcn 2)  Bactrim  Vital Signs:  Patient profile:   77 year old female Weight:      201.13 pounds O2 Sat:      91 % on Room air Temp:     98.3 degrees F oral Pulse rate:   102 / minute BP sitting:   130 / 74  (left arm) Cuff size:   large  Vitals Entered By: Vernie Murders (August 21, 2009 9:37 AM)  O2 Flow:  Room air  Physical Exam  Additional Exam:  obese ambulatory  bf nad.   wt 213 10/21/08 > 205 Dec 13, 2008 >>206 February 03, 2009 >  211 March 17, 2009 > 204 April 26, 2009 > 198 June 07, 2009 > 201 Afeb with normal vital signs HEENT: nl dentition, turbinates, and orophanx. Nl external ear canals without cough reflex Neck without JVD/Nodes/TM Lungs clear to A and P bilaterally without cough on insp or exp maneuvers RRR no s3 or murmur or increase in P2, trace edema only sym bilaterally Abd soft and benign with nl excursion in the supine position. No bruits or organomegaly Right shin 5x10 cm hyperpigmented, slt warm  mid shin  induration mild calor  and sign redness, no  calf tenderness  neg  homan's  White Cell Count          6.2 K/uL                    4.5-10.5   Red Cell Count            5.09 Mil/uL                 3.87-5.11   Hemoglobin                12.2 g/dL                   16.1-09.6   Hematocrit                40.2 %                      36.0-46.0   MCV                       79.0 fl                     78.0-100.0   MCHC                      30.4 L g/dL  30.0-36.0   RDW                       12.4 %                      11.5-14.6   Platelet Count            222.0 K/uL                  150.0-400.0   Neutrophil %              74.7 %                      43.0-77.0   Lymphocyte %              15.6 %                      12.0-46.0   Monocyte %                7.3 %                       3.0-12.0   Eosinophils%              2.0 %                       0.0-5.0   Basophils %                0.4 %                       0.0-3.0   Neutrophill Absolute      4.6 K/uL                    1.4-7.7   Lymphocyte Absolute       1.0 K/uL                    0.7-4.0   Monocyte Absolute         0.5 K/uL                    0.1-1.0  Eosinophils, Absolute                             0.1 K/uL                    0.0-0.7   Basophils Absolute        0.0 K/uL                    0.0-0.1  Tests: (2) BMP (METABOL)   Sodium                    140 mEq/L                   135-145   Potassium                 4.0 mEq/L                   3.5-5.1   Chloride                  103 mEq/L  96-112   Carbon Dioxide            31 mEq/L                    19-32   Glucose              [H]  217 mg/dL                   41-66   BUN                       11 mg/dL                    0-63   Creatinine           [H]  1.5 mg/dL                   0.1-6.0   Calcium                   9.2 mg/dL                   1.0-93.2   GFR                       42.55 mL/min                >60  Tests: (3) Hemoglobin A1C (A1C)   Hemoglobin A1C       [H]  6.7 %                       4.6-6.5  Impression & Recommendations:  Problem # 1:  DM (ICD-250.00)  Her updated medication list for this problem includes:    Lantus Solostar 100 Unit/ml Soln (Insulin glargine) .Allison KitchenMarland KitchenMarland KitchenMarland Rodgers 30  units at bedtime    Amaryl 2 Mg Tabs (Glimepiride) .Allison Rodgers... Take 1 tab by mouth once daily with meals.  Labs Reviewed: Creat: 1.5 (06/07/2009)    Reviewed HgBA1c results: 6.7 (03/17/2009)  6.7 (01/20/2009) > 6.7 August 21, 2009 so no change in rx  Orders: TLB-A1C / Hgb A1C (Glycohemoglobin) (83036-A1C) Est. Patient Level IV (35573)  Problem # 2:  EDEMA (ICD-782.3)  Renal function stable so no change in diuretic rx - diet reviewed  Problem # 3:  RENAL FAILURE, CHRONIC (ICD-585.9)  Orders: TLB-BMP (Basic Metabolic Panel-BMET) (80048-METABOL) Est. Patient Level IV (22025)  Labs Reviewed: BUN: 16 (06/07/2009)   Cr: 1.5 (06/07/2009)    > 1.5  August 21, 2009  so no change in rx Hgb: 12.4 (04/26/2009)   Hct: 38.8 (04/26/2009)   Ca++: 9.5 (06/07/2009)    TP: 7.8 (04/26/2009)   Alb: 3.6 (04/26/2009)    Each maintenance medication was reviewed in detail including most importantly the difference between maintenance and as needed and under what circumstances the prns are to be used. This was done in the context of a medication calendar review which provided the patient with a user-friendly unambiguous mechanism for medication administration and reconciliation and provides an action plan for all active problems. It is critical that this be shown to every doctor  for modification during the office visit if necessary so the patient can use it as a working document. See instructions for specific recommendations   No change in rx.    Each maintenance medication was reviewed in detail including most importantly the difference between maintenance and as needed and under what  circumstances the prns are to be used. This was done in the context of a medication calendar review which provided the patient with a user-friendly unambiguous mechanism for medication administration and reconciliation and provides an action plan for all active problems. It is critical that this be shown to every doctor  for modification during the office visit if necessary so the patient can use it as a working document.  See instructions for specific recommendations   Other Orders: TLB-CBC Platelet - w/Differential (85025-CBCD)  Patient Instructions: 1)  See calendar for specific medication instructions and bring it back for each and every office visit for every healthcare provider you see.  Without it,  you may not receive the best quality medical care that we feel you deserve.  2)  Return to office in 3 month for  Tammy  with all your medications, even over the counter meds, separated in two separate bags, the ones you take no matter what vs the ones you stop once you feel  better and take only as needed.  She will generate for you a new user friendly medication calendar that will put [place] all on the same page re: your medication use.

## 2010-09-11 NOTE — Assessment & Plan Note (Signed)
Summary: Primary svc/ f/u dm/ having low fasting sugars   Primary Provider/Referring Provider:  Sandrea Hughs, MD  CC:  3 month follow up.  Pt states BS is "acting up" x 1 week  -- going up and down.  Also c/o dizziness episode this am. .  History of Present Illness: 77 yobf with morbid obesity complicated by DM, HTN and Dyslipidemia and chronic venous insufficiency with dependent edema.  August 10, 2008--ov c/o of low back pain on right x 1 week in setting of  known history of intermittent back pain for last 30 years per pt. Previous  L5 radiculapathy confirmed by [facility] 08/2005.   Marland Kitchen Prev hx of DVT x 2 >15 yrs ago--preliminary venous doppler neg. for DVT, ruptured baker's cyst.   August 24, 2008 --returns for follow up. low back pain and knee only minimally improved, rec sulindac and referred to ortho >  [name]  October 21, 2008  cannot tolerate sulindac due to GI upset so control pain with darvocet twice daily, has vicodin but not using.    Dec 13, 2008 cc new lump right leg x 2 weeks red and hot no trauma or bite or sign increase in swelling, no fever or increase in chronic arthritis symptoms.  rx heat, elevation, no change in rx  January 20, 2009 c/o leg swelling and pain worse, back pain no worse, better with elevation, better with as needed lasix. -Rx Lasix increased to 2 each am and 1-2 as needed.    March 17, 2009 ov leg swelling no better, worse area of pain, swelling heat mid right shin. swelling better when elevates.  rec avelox x 10 days, increase spironolactone, and see ortho - did only 1/3 because could not take avelox nausea and did not know [name]. Lost med calendar  April 26, 2009 ov c/o leg swelling only better when elevates the leg, no improvement painful rash over shin.  took doxy instead of avelox.   June 07, 2009  ov cc  leg is doing much better- not swelling anymore.  She c/o "feeling nervous all the time".    no sob, orthopnea August 21, 2009 3 month followup.   Alprazolam has helped with nervousness.   better on lasix/ adlactone.  no overt low or high sugar symptoms.  November 20, 2009--Presents for follow up and med review. Doing well since last visit. She brought all her meds today and we updated her med list. Labs in January showed DM control was unchanged at 6.7. Kidney fnx was also unchanged at 1.5.>>-Add Aspirin 81mg  daily, and  Amary 2mg  1/2 once daily w/ meal.   February 19, 2010--Presents for 3 month follow up - states BS have been "up and down" ranging from 85-200.  pt is fasting this morning. Pt has chronic edema , wear TEDS during daytime which helps. She is doing well, trying to watch what she is eating, lost 4 lbs since seen 3 months ago. no hypoglycemics episodes. rec no change in rx  May 21, 2010 3 month follow up.  Pt states BS is "acting up" x 1 week  -- going up and down.  Also c/o dizziness episode this am around 5 all sweaty better after ate. BS 90 when got around to checking it.  Increase leg swelling also R > L.  Pt denies any significant sore throat, dysphagia, itching, sneezing,  nasal congestion or excess secretions,  fever, chills, sweats, unintended wt loss, pleuritic or exertional cp, hempoptysis, change in activity  tolerance  orthopnea pnd.   Current Medications (verified): 1)  Nexium 40 Mg  Cpdr (Esomeprazole Magnesium) .... Take 1 Capsule By Mouth Once Daily Before Meal 2)  Nasonex 50 Mcg/act  Susp (Mometasone Furoate) .Marland Kitchen.. 1-2 Spray Each Nostril Two Times A Day 3)  Spironolactone 50 Mg  Tabs (Spironolactone) .... Take 2 By Mouth in The Morning and 2  At Lunch 4)  Furosemide 40 Mg  Tabs (Furosemide) .... Take 2  By Mouth Once Daily 5)  Lantus Solostar 100 Unit/ml  Soln (Insulin Glargine) .... 30  Units At Bedtime 6)  Amaryl 2 Mg  Tabs (Glimepiride) .... Take 1/2 Tab By Mouth Once Daily With Breakfast For Diabetes 7)  Citrucel   Powd (Methylcellulose (Laxative)) .... Take 1 Tbsp  Every Morning 8)  Iron 325 (65 Fe) Mg  Tabs (Ferrous Sulfate) .Marland Kitchen.. 1 Once Daily 9)  Lumigan 0.03 %  Soln (Bimatoprost) .Marland Kitchen.. 1 Drop in Each Eye At Bedtime 10)  Calcium Carbonate-Vitamin D 600-400 Mg-Unit  Tabs (Calcium Carbonate-Vitamin D) .... Two Times A Day 11)  Aspir-Low 81 Mg Tbec (Aspirin) .Marland Kitchen.. 1 By Mouth Once Daily 12)  Furosemide 40 Mg  Tabs (Furosemide) .... One Extra Once A Day As Needed For Swelling. 13)  Tylenol Ex St Arthritis Pain 500 Mg  Tabs (Acetaminophen) .... Per Bottle As Needed 14)  Tramadol Hcl 50 Mg Tabs (Tramadol Hcl) .Marland Kitchen.. 1 Tab By Mouth Every 4 Hours As Needed Pain 15)  Kaopectate 750 Mg/1ml  Liqd (Attapulgite) .... Per Bottle 16)  Fluocinonide 0.05 % Crea (Fluocinonide) .... Apply Two Times A Day As Needed 17)  Alprazolam 0.25 Mg Tabs (Alprazolam) .... One Every 6 Hours If Needed For Neves  Allergies (verified): 1)  Pcn 2)  Bactrim  Past History:  Past Medical History: GERD (ICD-530.81) ABDOMINAL PAIN, CHRONIC (ICD-789.00) FATIGUE, CHRONIC (ICD-780.79) DEGENERATIVE JOINT DISEASE (ICD-715.90)      - L5 radiculopathy confirmed by mri 08/2005      --refer to ortho August 24, 2008 >>>  Dr  HYPERTENSIVE CARDIOVASCULAR DISEASE (ICD-402.90) OBESITY (ICD-278.00)     - Target wt  =   158  for BMI < 30      - Referred [facility] 04/2005 GASTRITIS, CHRONIC (ICD-535.10) BENIGN NEOPLASM OTH&UNSPEC SITE DIGESTIVE SYSTEM (ICD-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 abn asymmetry >>repeat in 6 months  MICROCYTIC ANEMA     -  DX 02/03/09,  Stool g neg x 6, rx  Fe 65 mg /day >  improving March 17, 2009     Edema--08/10/08--venous doppler neg for dvt, ?ruptured bakers cyst-right               March 20, 2009 repeat venous doppler > neg bilaterally               May 21, 2010 repeat venous dopplers >>  neg bilat     Vital Signs:  Patient profile:   77 year old female Height:      60 inches Weight:      203.25 pounds BMI:     39.84 O2 Sat:  96 % on Room air Temp:     98.0 degrees F oral Pulse rate:   85 / minute BP sitting:   140 / 74  (left arm) Cuff size:   large  Vitals Entered By: Gweneth Dimitri RN (May 21, 2010 9:45 AM)  O2 Flow:  Room air CC: 3 month follow up.  Pt states BS is "acting up" x 1 week  -- going up and down.  Also c/o dizziness episode this am.  Comments Medications reviewed with patient Daytime contact number verified with patient. Gweneth Dimitri RN  May 21, 2010 9:45 AM    Physical Exam  Additional Exam:  obese ambulatory  bf nad.   wt 213 10/21/08 >   204 April 26, 2009  > 196 February 19, 2010  > 203 May 21, 2010   HEENT: nl dentition, turbinates, and orophanx. Nl external ear canals without cough reflex Neck without JVD/Nodes/TM Lungs clear to A and P bilaterally without cough on insp or exp maneuvers RRR no s3 or murmur or increase in P2, trace edema only sym bilaterally Abd soft and benign with nl excursion in the supine position. No bruits or organomegaly EXT : chronic edema, venous insufficiency changes w/ stasis dermatic changes R > L  Sodium                    141 mEq/L                   135-145   Potassium                 4.1 mEq/L                   3.5-5.1   Chloride                  105 mEq/L                   96-112   Carbon Dioxide            29 mEq/L                    19-32   Glucose              [H]  128 mg/dL                   01-02   BUN                       14 mg/dL                    7-25   Creatinine           [H]  1.3 mg/dL                   3.6-6.4   Calcium                   9.2 mg/dL                   4.0-34.7    GFR                       51.47 mL/min                >60  Tests: (2) Hemoglobin A1C (A1C)   Hemoglobin A1C  6.3 %                       4.6-6.5     Glycemic Control Guidelines for People with Diabetes:     Non Diabetic:  <6%     Goal of Therapy: <7%     Additional Action Suggested:  >8%   Tests: (3) CBC Platelet w/Diff (CBCD)   White Cell Count          5.5 K/uL                    4.5-10.5   Red Cell Count            5.04 Mil/uL                 3.87-5.11   Hemoglobin                12.5 g/dL                   16.1-09.6   Hematocrit                39.1 %                      36.0-46.0   MCV                  [L]  77.6 fl                     78.0-100.0   MCHC                      32.0 g/dL                   04.5-40.9   RDW                       13.8 %                      11.5-14.6   Platelet Count            191.0 K/uL                  150.0-400.0   Neutrophil %              67.4 %                      43.0-77.0   Lymphocyte %              23.5 %                      12.0-46.0   Monocyte %                7.1 %                       3.0-12.0   Eosinophils%              1.1 %                       0.0-5.0   Basophils %               0.9 %  0.0-3.0   Neutrophill Absolute      3.7 K/uL                    1.4-7.7   Lymphocyte Absolute       1.3 K/uL                    0.7-4.0   Monocyte Absolute         0.4 K/uL                    0.1-1.0  Eosinophils, Absolute                             0.1 K/uL                    0.0-0.7   Basophils Absolute        0.0 K/uL                    0.0-0.1  Tests: (4) IBC Panel (IBC)   Iron                      82 ug/dL                    78-469   Transferrin               258.2 mg/dL                 629.5-284.1   Iron Saturation           22.7 %                      20.0-50.0  Impression & Recommendations:  Problem # 1:  DM (ICD-250.00)  Her updated medication list for this problem includes:    Lantus Solostar 100  Unit/ml Soln (Insulin glargine) .Marland Kitchen... 20   units at bedtime    Amaryl 2 Mg Tabs (Glimepiride) .Marland Kitchen... Take 1/2 tab by mouth once daily with breakfast for diabetes    Aspir-low 81 Mg Tbec (Aspirin) .Marland Kitchen... 1 by mouth once daily   Labs Reviewed: Creat: 1.2 (02/19/2010)    Reviewed HgBA1c results: 6.5 (02/19/2010) > 6.3 May 21, 2010 but having low sugars.  See instructions for specific recommendations   6.7 (08/21/2009)  Her updated medication list for this problem includes:    Lantus Solostar 100 Unit/ml Soln (Insulin glargine) .Marland Kitchen... 20   units at bedtime    Amaryl 2 Mg Tabs (Glimepiride) .Marland Kitchen... Take 1/2 tab by mouth once daily with breakfast for diabetes    Aspir-low 81 Mg Tbec (Aspirin) .Marland Kitchen... 1 by mouth once daily  Problem # 2:  EDEMA (ICD-782.3)  Orders: Misc. Referral (Misc. Ref) Est. Patient Level IV (32440)  Venous dopplers  neg, rx reviewed with pt  Problem # 3:  HYPERTENSION (ICD-401.9)  Her updated medication list for this problem includes:    Spironolactone 50 Mg Tabs (Spironolactone) .Marland Kitchen... Take 2 by mouth in the morning and 2  at lunch    Furosemide 40 Mg Tabs (Furosemide) .Marland Kitchen... Take 2  by mouth once daily    Furosemide 40 Mg Tabs (Furosemide) ..... One extra once a day as needed for swelling.   Each maintenance medication was reviewed in detail including most importantly the difference between maintenance and as needed and under what circumstances the prns are  to be used. This was done in the context of a medication calendar review which provided the patient with a user-friendly unambiguous mechanism for medication administration and reconciliation and provides an action plan for all active problems. It is critical that this be shown to every doctor  for modification during the office visit if necessary so the patient can use it as a working document.   Her updated medication list for this problem includes:    Spironolactone 50 Mg Tabs (Spironolactone) .Marland Kitchen... Take 2 by mouth  in the morning and 2  at lunch    Furosemide 40 Mg Tabs (Furosemide) .Marland Kitchen... Take 2  by mouth once daily    Furosemide 40 Mg Tabs (Furosemide) ..... One extra once a day as needed for swelling.  Orders: Est. Patient Level IV (16109)  Problem # 4:  ANEMIA (ICD-285.9)  Resolved, ok to stop iron rx when completes this bottle  Medications Added to Medication List This Visit: 1)  Lantus Solostar 100 Unit/ml Soln (Insulin glargine) .... 20   units at bedtime  Other Orders: Influenza Vaccine MCR (60454) TLB-BMP (Basic Metabolic Panel-BMET) (80048-METABOL) TLB-A1C / Hgb A1C (Glycohemoglobin) (83036-A1C) TLB-CBC Platelet - w/Differential (85025-CBCD) TLB-IBC Pnl (Iron/FE;Transferrin) (83550-IBC)  Patient Instructions: 1)  Reduce you lantus to 20 units at bedtime and if you're not averaging between 1220-180 fasting  see Tammy  2)  See calendar for specific medication instructions and bring it back for each and every office visit for every healthcare provider you see.  Without it,  you may not receive the best quality medical care that we feel you deserve. 3)  See Patient Care Coordinator before leaving for venous dopplers 4)  Return to office in 3 months with CPX  5)  LATE ADD:  ok to stop iron when finishes this bottle   Immunizations Administered:  Influenza Vaccine # 1:    Vaccine Type: Fluvax MCR    Site: left deltoid    Route: IM    Given by: Kandice Hams CMA    Exp. Date: 02/09/2011    Lot #: [id number]    VIS given: 03/06/10 version given May 21, 2010.  Flu Vaccine Consent Questions:    Do you have a history of severe allergic reactions to this vaccine? no    Any prior history of allergic reactions to egg and/or gelatin? no    Do you have a sensitivity to the preservative Thimersol? no    Do you have a past history of Guillan-Barre Syndrome? no    Do you currently have an acute febrile illness? no    Have you ever had a severe reaction to latex? no    Vaccine information  given and explained to patient? yes    Are you currently pregnant? no     Appended Document: Primary svc/ f/u dm/ having low fasting sugars Spoke with pt and informed of late add instructions per MW.  Pt verbalized understanding.

## 2010-09-13 NOTE — Assessment & Plan Note (Signed)
Summary: cough//jrc   Primary Provider/Referring Provider:  Sandrea Hughs, MD  CC:  cough with green and clear sputum /nasal drainage--body aches that started this morning--finished the zpak but these symptoms came back over the last few days.  History of Present Illness: 17 yobf with morbid obesity complicated by DM, HTN and Dyslipidemia and chronic venous insufficiency with dependent edema.  August 21, 2009 3 month followup.  Alprazolam has helped with nervousness.   better on lasix/ adlactone.  no overt low or high sugar symptoms.  November 20, 2009--Presents for follow up and med review. Doing well since last visit. She brought all her meds today and we updated her med list. Labs in January showed DM control was unchanged at 6.7. Kidney fnx was also unchanged at 1.5.>>-Add Aspirin 81mg  daily, and  Amary 2mg  1/2 once daily w/ meal.   February 19, 2010--Presents for 3 month follow up - states BS have been "up and down" ranging from 85-200.  pt is fasting this morning. Pt has chronic edema , wear TEDS during daytime which helps. She is doing well, trying to watch what she is eating, lost 4 lbs since seen 3 months ago. no hypoglycemics episodes. rec no change in rx  May 21, 2010 3 month follow up.  Pt states BS is "acting up" x 1 week  -- going up and down.  Also c/o dizziness episode this am around 5 all sweaty better after ate. BS 90 when got around to checking it.  Increase leg swelling also R > L.     August 17, 2010 --Presents for an acute office visit. Complains of cough with green and clear sputum /nasal drainage--body aches that started this morning. 2 weeks ago had similar symptoms and she finished the zpak -got completely better but then 2 days ago started to get symptoms again. Denies chest pain, dyspnea, orthopnea, hemoptysis, fever, n/v/d, edema, headache,recent travel.   Preventive Screening-Counseling & Management  Alcohol-Tobacco     Smoking Status: never  Allergies: 1)   Pcn 2)  Bactrim  Comments:  Nurse/Medical Assistant: The patient's medications and allergies were reviewed with the patient and were updated in the Medication and Allergy Lists.  Past History:  Past Medical History: Last updated: 05/21/2010 GERD (ICD-530.81) ABDOMINAL PAIN, CHRONIC (ICD-789.00) FATIGUE, CHRONIC (ICD-780.79) DEGENERATIVE JOINT DISEASE (ICD-715.90)      - L5 radiculopathy confirmed by mri 08/2005      --refer to ortho August 24, 2008 >>>  Dr  HYPERTENSIVE CARDIOVASCULAR DISEASE (ICD-402.90) OBESITY (ICD-278.00)     - Target wt  =   158  for BMI < 30      - Referred [facility] 04/2005 GASTRITIS, CHRONIC (ICD-535.10) BENIGN NEOPLASM OTH&UNSPEC SITE DIGESTIVE SYSTEM (ICD-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 abn asymmetry >>repeat in 6 months  MICROCYTIC ANEMA     -  DX 02/03/09,  Stool g neg x 6, rx  Fe 65 mg /day >  improving March 17, 2009    Edema--08/10/08--venous doppler neg for dvt, ?ruptured bakers cyst-right  March 20, 2009 repeat venous doppler > neg bilaterally               May 21, 2010 repeat venous dopplers >>  neg bilat     Past Surgical History: Last updated: 01/20/2009 Appendectomy Cholecystectomy 1999 Hysterectomy and bilateral oophorectomy 1957 umbilical and ventral hernia repair by Dr. Jamey Ripa 08-2005 R rotator cuff 7/05  Family History: Last updated: 03/22/2008 asthma in her brother who also has hypertension mother died of stroke, also had diabetes and arthritis father had multiple myeloma 4 siblings all in  relatively good health  Social History: Last updated: 02/19/2010 Patient never smoked.  positive for second-hand smoke exposure exercises 2-3 times a week 1 cup caffeine a day married 1 child  Review of Systems      See HPI  Vital Signs:  Patient profile:   77 year old female Height:      60 inches Weight:      196.13 pounds O2 Sat:      96 % on Room air Temp:     97.2 degrees F oral Pulse rate:   87 / minute BP sitting:   144 / 78  (left arm) Cuff size:   regular  Vitals Entered By: Randell Loop CMA (August 17, 2010 2:27 PM)  O2 Sat at Rest %:  97 O2 Flow:  Room air CC: cough with green and clear sputum /nasal drainage--body aches that started this morning--finished the zpak but these symptoms came back over the last few days Is Patient Diabetic? Yes Pain Assessment Patient in pain? yes      Onset of pain  epigastric pain with cough Comments meds reviewed with pt Randell Loop [facility]  August 17, 2010 2:31 PM    Physical Exam  Additional Exam:  obese ambulatory  bf nad.   wt 213 10/21/08 >   204 April 26, 2009  > 196 February 19, 2010  > 203 May 21, 2010 >196 August 17, 2010   HEENT: nl dentition, turbinates, and orophanx. Nl external ear canals without cough reflex Neck without JVD/Nodes/TM Lungs clear to A and P bilaterally without cough on insp or exp maneuvers RRR no s3 or murmur or increase in P2, trace edema only sym bilaterally Abd soft and benign with nl excursion in the supine position. No bruits or organomegaly EXT : chronic edema, venous insufficiency changes w/ stasis dermatic changes R > L   Impression & Recommendations:  Problem # 1:  BRONCHITIS, ACUTE (ICD-466.0)  Omnicef 300mg  two times a day for 7days Mucinex DM two times a day as needed cough/congestion Zyrtec 10mg  at bedtime for 1 week  Saline nasal rinse as needed  Hydromet 1/2 tsp every 6 hr as needed cough, may make you sleepy  Please contact office for sooner follow up if symptoms  do not improve or worsen  follow up Dr. [name] as planned and as needed  Her updated medication list for this problem includes:    Zithromax Z-pak 250 Mg Tabs (Azithromycin) .Marland Kitchen... Take as directed    Cefdinir 300 Mg Caps (Cefdinir) .Marland Kitchen... 1 by mouth two times a day    Hydromet 5-1.5 Mg/80ml Syrp (Hydrocodone-homatropine) .Marland Kitchen... 1/2 tsp every 6 hr as needed cough  may make you sleepy  Orders: Est. Patient Level III (14782)  Medications Added to Medication List This Visit: 1)  Cefdinir 300 Mg Caps (Cefdinir) .Marland Kitchen.. 1 by mouth two times a day 2)  Hydromet 5-1.5 Mg/34ml Syrp (Hydrocodone-homatropine) .Marland KitchenMarland KitchenMarland Kitchen  1/2 tsp every 6 hr as needed cough  may make you sleepy  Patient Instructions: 1)  Omnicef 300mg  two times a day for 7days 2)  Mucinex DM two times a day as needed cough/congestion 3)  Zyrtec 10mg  at bedtime for 1 week  4)  Saline nasal rinse as needed  5)  Hydromet 1/2 tsp every 6 hr as needed cough, may make you sleepy  6)  Please contact office for sooner follow up if symptoms do not improve or worsen  7)  follow up Dr. [name] as planned and as needed  Prescriptions: HYDROMET 5-1.5 MG/5ML SYRP (HYDROCODONE-HOMATROPINE) 1/2 tsp every 6 hr as needed cough  may make you sleepy  #8 oz x 0   Entered and Authorized by:   Rubye Oaks NP   Signed by:   Tammy Parrett NP on 08/17/2010   Method used:   Print then Give to Patient   RxID:   [id number] CEFDINIR 300 MG CAPS (CEFDINIR) 1 by mouth two times a day  #14 x 0   Entered and Authorized by:   Rubye Oaks NP   Signed by:   Tammy Parrett NP on 08/17/2010   Method used:   Print then Give to Patient   RxID:   [id number]

## 2010-09-13 NOTE — Assessment & Plan Note (Signed)
Summary: Primary svc/ cpx   Primary Provider/Referring Provider:  Sandrea Hughs, MD  CC:  cpx fasting.  History of Present Illness: 77 yobf with morbid obesity complicated by DM, HTN and Dyslipidemia and chronic venous insufficiency with dependent edema.  August 21, 2009 3 month followup.  Alprazolam has helped with nervousness.   better on lasix/ adlactone.  no overt low or high sugar symptoms.   February 19, 2010--Presents for 3 month follow up - states BS have been "up and down" ranging from 85-200.  pt is fasting this morning. Pt has chronic edema , wear TEDS during daytime which helps. She is doing well, trying to watch what she is eating, lost 4 lbs since seen 3 months ago. no hypoglycemics episodes. rec no change in rx  May 21, 2010 3 month follow up.  Pt states BS is "acting up" x 1 week  -- going up and down.  Also c/o dizziness episode this am around 5 all sweaty better after ate. BS 90 when got around to checking it.  Increase leg swelling also R > L.     August 17, 2010 --Presents for an acute office visit. Complains of cough with green and clear sputum /nasal drainage--body aches that started this morning. 2 weeks ago had similar symptoms and she finished the zpak -got completely better but then 2 days ago started to get symptoms again.Omnicef 300mg  two times a day for 7days Mucinex DM two times a day as needed cough/congestion Zyrtec 10mg  at bedtime for 1 week  Saline nasal rinse as needed  Hydromet 1/2 tsp every 6 hr as needed cough, may make you sleepy    see page 2  August 28, 2010 cpx getting all better.  limited by legs/hip/ knees no sob or increase leg swelling. Pt denies any significant sore throat, dysphagia, itching, sneezing,  nasal congestion or excess secretions,  fever, chills, sweats, unintended wt loss, pleuritic or exertional cp, hempoptysis, change in activity tolerance  orthopnea pnd     Current Medications (verified): 1)  Nexium 40 Mg  Cpdr (Esomeprazole  Magnesium) .... Take 1 Capsule By Mouth Once Daily Before Meal 2)  Nasonex 50 Mcg/act  Susp (Mometasone Furoate) .Marland Kitchen.. 1-2 Spray Each Nostril Two Times A Day 3)  Spironolactone 50 Mg  Tabs (Spironolactone) .... Take 2 By Mouth in The Morning and 2  At Lunch 4)  Furosemide 40 Mg  Tabs (Furosemide) .... Take 2  By Mouth Once Daily 5)  Lantus Solostar 100 Unit/ml  Soln (Insulin Glargine) .... 20   Units At Bedtime 6)  Amaryl 2 Mg  Tabs (Glimepiride) .... Take 1/2 Tab By Mouth Once Daily With Breakfast For Diabetes 7)  Citrucel   Powd (Methylcellulose (Laxative)) .... Take 1 Tbsp  Every Morning 8)  Iron 325 (65 Fe) Mg Tabs (Ferrous Sulfate) .Marland Kitchen.. 1 Once Daily 9)  Lumigan 0.03 %  Soln (Bimatoprost) .Marland Kitchen.. 1 Drop in Each Eye At Bedtime 10)  Calcium Carbonate-Vitamin D 600-400 Mg-Unit  Tabs (Calcium Carbonate-Vitamin D) .... Two Times A Day 11)  Aspir-Low 81 Mg Tbec (Aspirin) .Marland Kitchen.. 1 By Mouth Once Daily 12)  Furosemide 40 Mg  Tabs (Furosemide) .... One Extra Once A Day As Needed For Swelling. 13)  Tylenol Ex St Arthritis Pain 500 Mg  Tabs (Acetaminophen) .... Per Bottle As Needed 14)  Tramadol Hcl 50 Mg Tabs (Tramadol Hcl) .Marland Kitchen.. 1 Tab By Mouth Every 4 Hours As Needed Pain 15)  Kaopectate 750 Mg/54ml  Liqd (Attapulgite) .Marland KitchenMarland KitchenMarland Kitchen  Per Bottle 16)  Fluocinonide 0.05 % Crea (Fluocinonide) .... Apply Two Times A Day As Needed 17)  Alprazolam 0.25 Mg Tabs (Alprazolam) .... One Every 6 Hours If Needed For Neves 18)  Cetirizine Hcl 10 Mg Tabs (Cetirizine Hcl) .Marland Kitchen.. 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 abn asymmetry >>repeat in 6 months  MICROCYTIC ANEMA     -  DX 02/03/09,  Stool g neg x 6, rx  Fe 65 mg /day >  improving March 17, 2009    Edema--08/10/08--venous doppler neg for dvt, ?ruptured bakers cyst-right               March 20, 2009 repeat venous doppler > neg bilaterally               May 21, 2010 repeat venous dopplers >>  neg bilat     Family History: Reviewed history from 03/22/2008 and no changes required. asthma in her brother who also has hypertension mother died of stroke, also had diabetes and arthritis father had multiple myeloma 4 siblings all in relatively good health  Social History: Reviewed history from 02/19/2010 and no changes required. Patient never smoked.  positive for second-hand smoke exposure exercises 2-3 times a week 1 cup caffeine a day married 1 child  Vital Signs:  Patient profile:   77 year old female Height:      60 inches Weight:      199 pounds O2 Sat:      96 % on Room air Temp:     98.1 degrees F oral Pulse rate:   82 / minute BP sitting:   142 / 76  (left arm) Cuff size:   large  Vitals Entered By: Vernie Murders (August 28, 2010 8:51 AM)  O2 Flow:  Room air  Physical Exam  Additional Exam:  obese ambulatory  bf nad.   wt 213 10/21/08 >   204 April 26, 2009  > 196 February 19, 2010  > 203 May 21, 2010 >196 August 17, 2010 > 199 August 28, 2010   HEENT: nl  dentition, turbinates, and orophanx. Nl external ear canals without cough reflex Neck without JVD/Nodes/TM Lungs clear to A and P bilaterally without cough on insp or exp maneuvers RRR no s3 or murmur or increase in P2, trace edema only sym bilaterally Abd soft and benign with nl excursion in the supine position. No bruits or organomegaly EXT : chronic edema, venous insufficiency changes w/ stasis dermatic changes R > L Cholesterol  163 mg/dL                   1-191     ATP III Classification            Desirable:  < 200 mg/dL                    Borderline High:  200 - 239 mg/dL               High:  > = 240 mg/dL   Triglycerides             90.0 mg/dL                  4.7-829.5     Normal:  <150 mg/dL     Borderline High:  621 - 199 mg/dL   HDL                       30.86 mg/dL                 >57.84   VLDL Cholesterol          18.0 mg/dL                  6.9-62.9   LDL Cholesterol           94 mg/dL                    5-28  CHO/HDL Ratio:  CHD Risk                             3                    Men          Women     1/2 Average Risk     3.4          3.3     Average Risk          5.0          4.4     2X Average Risk          9.6          7.1     3X Average Risk          15.0          11.0                           Tests: (2) Renal Function Panel (RENAL)   Sodium                    140 mEq/L                   135-145   Potassium                 4.2 mEq/L                   3.5-5.1   Chloride                  106 mEq/L                   96-112   Carbon Dioxide            28 mEq/L  19-32   Calcium                   9.3 mg/dL                   0.4-54.0   Albumin                   3.6 g/dL                    9.8-1.1   BUN                       20 mg/dL                    9-14   Creatinine           [H]  1.4 mg/dL                   7.8-2.9   Glucose              [H]  107 mg/dL                   56-21   Phosphorus                3.4 mg/dL                    3.0-8.6   GFR                  [L]  44.85 mL/min                >60.00  Tests: (3) BMP (METABOL)   Sodium                    140 mEq/L                   135-145   Potassium                 4.2 mEq/L                   3.5-5.1   Chloride                  106 mEq/L                   96-112   Carbon Dioxide            28 mEq/L                    19-32   Glucose              [H]  107 mg/dL                   57-84   BUN                       20 mg/dL                    6-96   Creatinine           [H]  1.4 mg/dL                   2.9-5.2   Calcium                   9.3 mg/dL  8.4-10.5   GFR                  [L]  44.85 mL/min                >60.00  Tests: (4) CBC Platelet w/Diff (CBCD)   White Cell Count          5.4 K/uL                    4.5-10.5   Red Cell Count            5.07 Mil/uL                 3.87-5.11   Hemoglobin                12.6 g/dL                   40.9-81.1   Hematocrit                39.2 %                      36.0-46.0   MCV                  [L]  77.3 fl                     78.0-100.0   MCHC                      32.0 g/dL                   91.4-78.2   RDW                       13.8 %                      11.5-14.6   Platelet Count            196.0 K/uL                  150.0-400.0   Neutrophil %              65.5 %                      43.0-77.0   Lymphocyte %              22.9 %                      12.0-46.0   Monocyte %                8.9 %                       3.0-12.0   Eosinophils%              1.9 %                       0.0-5.0   Basophils %               0.8 %                       0.0-3.0   Neutrophill Absolute      3.6 K/uL  1.4-7.7   Lymphocyte Absolute       1.2 K/uL                    0.7-4.0   Monocyte Absolute         0.5 K/uL                    0.1-1.0  Eosinophils, Absolute                             0.1 K/uL                    0.0-0.7   Basophils Absolute        0.0 K/uL                    0.0-0.1  Tests: (5)  Hepatic/Liver Function Panel (HEPATIC)   Total Bilirubin           0.7 mg/dL                   1.6-1.0   Direct Bilirubin          0.1 mg/dL                   9.6-0.4   Alkaline Phosphatase      78 U/L                      39-117   AST                       19 U/L                      0-37   ALT                       17 U/L                      0-35   Total Protein             7.2 g/dL                    5.4-0.9   Albumin                   3.6 g/dL                    8.1-1.9  Tests: (6) TSH (TSH)   FastTSH                   1.68 uIU/mL                 0.35-5.50  Tests: (7) Microalbumin/Creatinine Ratio (MALB)   Microalbumin              1.5 mg/dL                   1.4-7.8   Urine Creainine           239.4 mg/dL   Microalbumin Ratio        0.6 mg/g                    0.0-30.0  Tests: (8) Hemoglobin A1C (A1C)   Hemoglobin A1C       [H]  6.7 %  4.6-6.5     Glycemic Control Guidelines for People with Diabetes:     Non Diabetic:  <6%     Goal of Therapy: <7%     Additional Action Suggested:  >8%   Tests: (9) UDip w/Micro (URINE)   Color                     LT. YELLOW       RANGE:  Yellow;Lt. Yellow   Clarity                   CLEAR                       Clear   Specific Gravity          1.025                       1.000 - 1.030   Urine Ph                  5.5                         5.0-8.0   Protein                   NEGATIVE                    Negative   Urine Glucose             NEGATIVE                    Negative   Ketones                   NEGATIVE                    Negative   Urine Bilirubin           NEGATIVE                    Negative   Blood                     LARGE                       Negative   Urobilinogen              0.2                         0.0 - 1.0   Leukocyte Esterace        SMALL                       Negative   Nitrite                   NEGATIVE                    Negative   Urine WBC                 3-6/hpf                      0-2/hpf   Urine RBC                 3-6/hpf  0-2/hpf   Urine Mucus               Presence of                 None   Urine Epith               Many(>10/hpf)               Rare(0-4/hpf)   Urine Bacteria            Few(10-50/hpf)              None   CXR  Procedure date:  08/28/2010  Findings:      Stable chest  radiograph.  Borderline cardiomegaly.  No acute findings  Impression & Recommendations:  Problem # 1:  DM (ICD-250.00)  Her updated medication list for this problem includes:    Lantus Solostar 100 Unit/ml Soln (Insulin glargine) .Marland Kitchen... 20   units at bedtime    Amaryl 2 Mg Tabs (Glimepiride) .Marland Kitchen... Take 1/2 tab by mouth once daily with breakfast for diabetes    Aspir-low 81 Mg Tbec (Aspirin) .Marland Kitchen... 1 by mouth once daily  Labs Reviewed: Creat: 1.3 (05/21/2010)    Reviewed HgBA1c results: 6.3 (05/21/2010) > 6.7 August 28, 2010   6.5 (02/19/2010)  Problem # 2:  HYPERTENSION (ICD-401.9)  Her updated medication list for this problem includes:    Spironolactone 50 Mg Tabs (Spironolactone) .Marland Kitchen... Take 2 by mouth in the morning and 2  at lunch    Furosemide 40 Mg Tabs (Furosemide) .Marland Kitchen... Take 2  by mouth once daily    Furosemide 40 Mg Tabs (Furosemide) ..... One extra once a day as needed for swelling.  Orders: EKG w/ Interpretation (93000)  ok on rx  Problem # 3:  RENAL FAILURE, CHRONIC (ICD-585.9)  Labs Reviewed: BUN: 14 (05/21/2010)   Cr: 1.3 (05/21/2010)   > 1.4 August 28, 2010 c/w Stage III  Hgb: 12.5 (05/21/2010)   Hct: 39.1 (05/21/2010)   Ca++: 9.2 (05/21/2010)    TP: 7.8 (04/26/2009)   Alb: 3.6 (04/26/2009)  Problem # 4:  MORBID OBESITY (ICD-278.01) discussed    Each maintenance medication was reviewed in detail including most importantly the difference between maintenance and as needed and under what circumstances the prns are to be used.  To keep things simple, I have asked the patient to first separate medicines that are perceived as  maintenance, that is to be taken daily "no matter what", from those medicines that are taken on only on an as-needed basis and I have given the patient examples of both, and then return to see our NP to generate a  detailed  medication calendar which should be followed until the next physician sees the patient and updates it.     Problem # 5:  DYSLIPIDEMIA (ICD-272.4) Ideally LDL should be < 70 in diabetic but at age 81 doubt any benefit to pushing further  Medications Added to Medication List This Visit: 1)  Cetirizine Hcl 10 Mg Tabs (Cetirizine hcl) .Marland Kitchen.. 1 once daily  Other Orders: Est. Patient 65& > ([address]) T-2 View CXR (71020TC) TLB-Lipid Panel (80061-LIPID) TLB-Renal Function Panel (80069-RENAL) TLB-BMP (Basic Metabolic Panel-BMET) (80048-METABOL) TLB-CBC Platelet - w/Differential (85025-CBCD) TLB-Hepatic/Liver Function Pnl (80076-HEPATIC) TLB-TSH (Thyroid Stimulating Hormone) (84443-TSH) TLB-Udip ONLY (81003-UDIP) TLB-Microalbumin/Creat Ratio, Urine (82043-MALB) TLB-A1C / Hgb A1C (Glycohemoglobin) (83036-A1C)  Patient Instructions: 1)  See Tammy NP w/in 2 weeks with all your medications, even over the counter meds, separated in two separate  bags, the ones you take no matter what vs the ones you stop once you feel better and take only as needed.  She will generate for you a new user friendly medication calendar that will put [place] all on the same page re: your medication use. Tammy will go over the lab work with you but I will call in meantime if needed

## 2010-09-13 NOTE — Progress Notes (Signed)
Summary: xanax refill request> ok  Phone Note From Pharmacy   Caller: [facility] Call For: Allison Rodgers  Summary of Call: Pt requesting refill on Xanax 0.25.Please advise if ok to refill.  Initial call taken by: Carron Curie CMA,  August 29, 2010 11:03 AM  Follow-up for Phone Call        ok Follow-up by: Nyoka Cowden MD,  August 29, 2010 11:10 AM  Additional Follow-up for Phone Call Additional follow up Details #1::        refill sent.Carron Curie CMA  August 29, 2010 11:34 AM     Prescriptions: ALPRAZOLAM 0.25 MG TABS (ALPRAZOLAM) one every 6 hours if needed for neves  #90 x 0   Entered by:   Carron Curie CMA   Authorized by:   Nyoka Cowden MD   Signed by:   Carron Curie CMA on 08/29/2010   Method used:   Telephoned to ...       [facility] (retail)       [address]       [place], [place]  [address]       Ph: [phone]       Fax: [phone]   RxID:   [id number]

## 2010-09-13 NOTE — Progress Notes (Signed)
Summary: sick again/cb  Phone Note Call from Patient Call back at Home Phone [phone]   Caller: Patient Call For: Aeryn Medici Summary of Call: still sick clear mucus achy and pain very weak legs [facility] [address] Initial call taken by: Lacinda Axon,  August 17, 2010 12:50 PM  Follow-up for Phone Call        Pt was given a zpak on 08-07-10 but states she does not feel well. Set an appt for TP today at2:45. Carron Curie CMA  August 17, 2010 1:21 PM

## 2010-09-13 NOTE — Progress Notes (Signed)
Summary: sick > zpak  Phone Note Call from Patient Call back at Home Phone [phone]   Caller: Patient Call For: wert Reason for Call: Talk to Nurse Summary of Call: Patient calling w/ c/o low grade fever, chest congestion, productive cough w/ clear/yellow sputum,  and headache x4days.  [facility]. Initial call taken by: Lehman Prom,  August 07, 2010 9:15 AM  Follow-up for Phone Call        Dr. [name] primary care pt-- Pt c/o productive cough with yellow phlegm, wheezing, increased SOB, chest tightness, headache all x 4-5 days.  Pt has been using robutussin OTC. Pelase advise.Carron Curie CMA  August 07, 2010 10:28 AM allergies: PCN, bactrim  Additional Follow-up for Phone Call Additional follow up Details #1::        Per CDY-okay to give Zpak # 1 take as directed no refills.Shylah Dossantos CMA  August 07, 2010 12:16 PM   called spoke with patient, advised of CDY's recs as stated above.  pt verbalized her understanding.  rx telephoned to [facility]. Boone Master CNA/MA  August 07, 2010 12:21 PM     New/Updated Medications: ZITHROMAX Z-PAK 250 MG TABS (AZITHROMYCIN) take as directed Prescriptions: ZITHROMAX Z-PAK 250 MG TABS (AZITHROMYCIN) take as directed  #1 x 0   Entered by:   Boone Master CNA/MA   Authorized by:   Waymon Budge MD   Signed by:   Boone Master CNA/MA on 08/07/2010   Method used:   Telephoned to ...       [facility] (retail)       [address]       [place], [place]  [address]       Ph: [phone]       Fax: [phone]   RxID:   [id number]

## 2010-09-19 NOTE — Assessment & Plan Note (Signed)
Summary: NP Med Calendar Visit   Primary Provider/Referring Provider:  Sandrea Hughs, MD  CC:  Pt here for med calendar.Marland Kitchen  History of Present Illness: 11 yobf with morbid obesity complicated by DM, HTN and Dyslipidemia and chronic venous insufficiency with dependent edema.  August 21, 2009 3 month followup.  Alprazolam has helped with nervousness.   better on lasix/ adlactone.  no overt low or high sugar symptoms.   February 19, 2010--Presents for 3 month follow up - states BS have been "up and down" ranging from 85-200.  pt is fasting this morning. Pt has chronic edema , wear TEDS during daytime which helps. She is doing well, trying to watch what she is eating, lost 4 lbs since seen 3 months ago. no hypoglycemics episodes. rec no change in rx  May 21, 2010 3 month follow up.  Pt states BS is "acting up" x 1 week  -- going up and down.  Also c/o dizziness episode this am around 5 all sweaty better after ate. BS 90 when got around to checking it.  Increase leg swelling also R > L.     August 17, 2010 --Presents for an acute office visit. Complains of cough with green and clear sputum /nasal drainage--body aches that started this morning. 2 weeks ago had similar symptoms and she finished the zpak -got completely better but then 2 days ago started to get symptoms again.Omnicef 300mg  two times a day for 7days Mucinex DM two times a day as needed cough/congestion Zyrtec 10mg  at bedtime for 1 week  Saline nasal rinse as needed  Hydromet 1/2 tsp every 6 hr as needed cough, may make you sleepy    see page 2  August 28, 2010 cpx getting all better.  limited by legs/hip/ knees no sob or increase leg swelling. >>labs  done w/no rx changes   September 11, 2010 --Pt here for follow and med calendar. Last ov with CPX. Labs were  w/no significant changes. A1C at 6.7 , s.CR  holding at 1.4 . We reviewed all her labs in detail.Urine did show microscopic hematuria . Discussed a DM and low cholestrol diet.  She is recovering from a prolonged bronchitis-feeling better but not back to baseline. She has clear drainge and energy level is low. We reviewed all her meds and organized her med calendar. Denies chest pain,  orthopnea, hemoptysis, fever, n/v/d, edema, headache.   Allergies (verified): 1)  Pcn 2)  Bactrim  Past History:  Family History: Last updated: 03/22/2008 asthma in her brother who also has hypertension mother died of stroke, also had diabetes and arthritis father had multiple myeloma 4 siblings all in relatively good health  Social History: Last updated: 02/19/2010 Patient never smoked.  positive for second-hand smoke exposure exercises 2-3 times a week 1 cup caffeine a day married 1 child  Risk Factors: Smoking Status: never (08/17/2010)  Past Medical History: GERD (ICD-530.81) ABDOMINAL PAIN, CHRONIC (ICD-789.00) FATIGUE, CHRONIC (ICD-780.79) DEGENERATIVE JOINT DISEASE (ICD-715.90)      - L5 radiculopathy confirmed by mri 08/2005      --refer to ortho August 24, 2008 >>>  Dr Darrelyn Hillock HYPERTENSIVE CARDIOVASCULAR DISEASE (ICD-402.90) OBESITY (ICD-278.00)     - Target wt  =   158  for BMI < 30      - Referred [facility] 04/2005 GASTRITIS, CHRONIC (ICD-535.10) BENIGN NEOPLASM OTH&UNSPEC SITE DIGESTIVE SYSTEM (ICD-211.9) ESOPHAGEAL STRICTURE (ICD-530.3)..............................................Marland KitchenPatterson     - EGD  12/14/07 HIATAL HERNIA (ICD-553.3) COLONIC POLYPS, ADENOMATOUS (ICD-211.3)     -  Colonoscopy 12/14/07 DIVERTICULOSIS, COLON (ICD-562.10) DYSLIPIDEMIA (ICD-272.4) ARTHRITIS (ICD-716.90) DM (ICD-250.00) HYPERTENSION (ICD-401.9) HEALTH MAINTENANCE    -  dt 03/2003    - Pneumovax 03/2003    - CPX  August 28, 2010      -Mammogram 10/2009, ? abn asymmetry >>repeat in 6 months  MICROCYTIC ANEMA     -  DX 02/03/09,  Stool g neg x 6, rx  Fe 65 mg /day >  improving March 17, 2009    Edema--08/10/08--venous doppler neg for dvt, ?ruptured bakers  cyst-right               March 20, 2009 repeat venous doppler > neg bilaterally               May 21, 2010 repeat venous dopplers >>  neg bilat Complex med regimen>>Meds reviewed with pt education and computerized med calendar September 11, 2010     Vital Signs:  Patient profile:   77 year old female Height:      60 inches Weight:      203.13 pounds BMI:     39.81 O2 Sat:      99 % on Room air Temp:     97.5 degrees F oral Pulse rate:   83 / minute BP sitting:   140 / 62  (left arm) Cuff size:   large  Vitals Entered By: Carver Fila (September 11, 2010 9:13 AM)  O2 Flow:  Room air CC: Pt here for med calendar. Comments meds and allergies updated Phone number updated Carver Fila  September 11, 2010 9:13 AM    Physical Exam  Additional Exam:  obese ambulatory  bf nad.   wt 213 10/21/08 >   204 April 26, 2009  > 196 February 19, 2010  > 203 May 21, 2010 >196 August 17, 2010 > 199 August 28, 2010 >> 203 September 11, 2010   HEENT: nl dentition, turbinates, and orophanx. Nl external ear canals without cough reflex Neck without JVD/Nodes/TM Lungs clear to A and P bilaterally without cough on insp or exp maneuvers RRR no s3 or murmur or increase in P2, trace edema only sym bilaterally Abd soft and benign with nl excursion in the supine position. No bruits or organomegaly EXT : chronic edema, venous insufficiency changes w/ stasis dermatic changes R > L   Impression & Recommendations:  Problem # 1:  DM (ICD-250.00)  Controlled on rx  diet and weight loss. Meds reviewed with pt education and computerized med calendar adjusted.  will change amayrl to lunch time   Her updated medication list for this problem includes:    Lantus Solostar 100 Unit/ml Soln (Insulin glargine) .Marland Kitchen... 20   units at bedtime    Amaryl 2 Mg Tabs (Glimepiride) .Marland Kitchen... Take 1/2 tab by mouth once daily with breakfast for diabetes    Aspir-low 81 Mg Tbec (Aspirin) .Marland Kitchen... 1 by mouth in the  morning  Orders: Est. Patient Level III ([address]109)  Problem # 2:  HEMATURIA, MICROSCOPIC, HX OF (ICD-V13.09) recheck urine. Orders: T-Culture, Urine ([id number]) TLB-Udip w/ Micro (81001-URINE) Est. Patient Level III (91478)  Medications Added to Medication List This Visit: 1)  Furosemide 40 Mg Tabs (Furosemide) .... Take 2  by mouth in the morning 2)  Aspir-low 81 Mg Tbec (Aspirin) .Marland Kitchen.. 1 by mouth in the morning 3)  Calcium Carbonate-vitamin D 600-400 Mg-unit Tabs (Calcium carbonate-vitamin d) .... One tablet by mouth every morning 4)  Saline Nasal Spray 0.65 % Soln (Saline) .Marland KitchenMarland KitchenMarland Kitchen  2 puffs every 4 hrs as needed  Patient Instructions: 1)  Move the Glimepiride to lunch time 2)  Follow the med calendar closely and bring to each visit. 3)  follow up Dr. [name] in  3 months  and as needed    Orders Added: 1)  T-Culture, Urine [[id number]] 2)  TLB-Udip w/ Micro [81001-URINE] 3)  Est. Patient Level III [14782]    Immunization History:  Pneumovax Immunization History:    Pneumovax:  historical (05/12/2008)   Appended Document: med calendar update Medications Added CETIRIZINE HCL 10 MG TABS (CETIRIZINE HCL) Take 1 tablet by mouth once a day as needed for nasal drip / drainage HYDROMET 5-1.5 MG/5ML SYRP (HYDROCODONE-HOMATROPINE) 1/2 teaspoon by mouth every 6 hours as needed for cough          Clinical Lists Changes  Medications: Changed medication from CETIRIZINE HCL 10 MG TABS (CETIRIZINE HCL) 1 once daily to CETIRIZINE HCL 10 MG TABS (CETIRIZINE HCL) Take 1 tablet by mouth once a day as needed for nasal drip / drainage Added new medication of HYDROMET 5-1.5 MG/5ML SYRP (HYDROCODONE-HOMATROPINE) 1/2 teaspoon by mouth every 6 hours as needed for cough

## 2010-09-25 ENCOUNTER — Telehealth: Payer: Self-pay | Admitting: Internal Medicine

## 2010-10-03 NOTE — Progress Notes (Signed)
  Phone Note Refill Request Message from:  Fax from Pharmacy  Refills Requested: Medication #1:  FUROSEMIDE 40 MG  TABS take 2  by mouth in the morning Initial call taken by: Zackery Barefoot CMA,  September 25, 2010 10:43 AM    Prescriptions: FUROSEMIDE 40 MG  TABS (FUROSEMIDE) take 2  by mouth in the morning  #60 x 3   Entered by:   Zackery Barefoot CMA   Authorized by:   Nyoka Cowden MD   Signed by:   Zackery Barefoot CMA on 09/25/2010   Method used:   Electronically to        [facility] (retail)       [address]       [place], [place]  [address]       Ph: [phone]       Fax: [phone]   RxID:   [id number]

## 2010-11-08 ENCOUNTER — Other Ambulatory Visit: Payer: Self-pay | Admitting: Orthopedic Surgery

## 2010-11-08 DIAGNOSIS — M549 Dorsalgia, unspecified: Secondary | ICD-10-CM

## 2010-11-08 DIAGNOSIS — M79605 Pain in left leg: Secondary | ICD-10-CM

## 2010-11-12 ENCOUNTER — Ambulatory Visit
Admission: RE | Admit: 2010-11-12 | Discharge: 2010-11-12 | Disposition: A | Discharge status: Home / Self Care | Payer: Medicare Other | Source: Ambulatory Visit | Attending: Orthopedic Surgery | Admitting: Orthopedic Surgery

## 2010-11-12 DIAGNOSIS — M79605 Pain in left leg: Secondary | ICD-10-CM

## 2010-11-12 DIAGNOSIS — M549 Dorsalgia, unspecified: Secondary | ICD-10-CM

## 2010-12-04 ENCOUNTER — Encounter: Payer: Self-pay | Admitting: Adult Health

## 2010-12-05 ENCOUNTER — Encounter: Payer: Self-pay | Admitting: Internal Medicine

## 2010-12-07 ENCOUNTER — Other Ambulatory Visit: Payer: Self-pay | Admitting: Orthopedic Surgery

## 2010-12-07 ENCOUNTER — Encounter (HOSPITAL_COMMUNITY): Payer: Medicare Other

## 2010-12-07 ENCOUNTER — Ambulatory Visit (HOSPITAL_COMMUNITY)
Admission: RE | Admit: 2010-12-07 | Discharge: 2010-12-07 | Disposition: A | Discharge status: Home / Self Care | Payer: Medicare Other | Source: Ambulatory Visit | Attending: Orthopedic Surgery | Admitting: Orthopedic Surgery

## 2010-12-07 ENCOUNTER — Other Ambulatory Visit (HOSPITAL_COMMUNITY): Payer: Self-pay | Admitting: Orthopedic Surgery

## 2010-12-07 DIAGNOSIS — M48061 Spinal stenosis, lumbar region without neurogenic claudication: Secondary | ICD-10-CM | POA: Insufficient documentation

## 2010-12-07 DIAGNOSIS — Z01818 Encounter for other preprocedural examination: Secondary | ICD-10-CM

## 2010-12-07 DIAGNOSIS — M519 Unspecified thoracic, thoracolumbar and lumbosacral intervertebral disc disorder: Secondary | ICD-10-CM | POA: Insufficient documentation

## 2010-12-07 DIAGNOSIS — M545 Low back pain, unspecified: Secondary | ICD-10-CM | POA: Insufficient documentation

## 2010-12-07 DIAGNOSIS — Z79899 Other long term (current) drug therapy: Secondary | ICD-10-CM | POA: Insufficient documentation

## 2010-12-07 DIAGNOSIS — Z01812 Encounter for preprocedural laboratory examination: Secondary | ICD-10-CM | POA: Insufficient documentation

## 2010-12-07 DIAGNOSIS — Z7982 Long term (current) use of aspirin: Secondary | ICD-10-CM | POA: Insufficient documentation

## 2010-12-07 LAB — URINALYSIS, ROUTINE W REFLEX MICROSCOPIC
Bilirubin Urine: NEGATIVE
Hgb urine dipstick: NEGATIVE
Ketones, ur: NEGATIVE mg/dL
Specific Gravity, Urine: 1.023 (ref 1.005–1.030)
pH: 5.5 (ref 5.0–8.0)

## 2010-12-07 LAB — COMPREHENSIVE METABOLIC PANEL
ALT: 13 U/L (ref 0–35)
Albumin: 3.5 g/dL (ref 3.5–5.2)
Alkaline Phosphatase: 81 U/L (ref 39–117)
BUN: 15 mg/dL (ref 6–23)
Chloride: 108 mEq/L (ref 96–112)
Glucose, Bld: 198 mg/dL — ABNORMAL HIGH (ref 70–99)
Potassium: 4 mEq/L (ref 3.5–5.1)
Sodium: 141 mEq/L (ref 135–145)
Total Bilirubin: 0.8 mg/dL (ref 0.3–1.2)
Total Protein: 7.5 g/dL (ref 6.0–8.3)

## 2010-12-07 LAB — URINE MICROSCOPIC-ADD ON

## 2010-12-07 LAB — CBC
HCT: 39.4 % (ref 36.0–46.0)
Hemoglobin: 12.4 g/dL (ref 12.0–15.0)
MCH: 23.8 pg — ABNORMAL LOW (ref 26.0–34.0)
MCHC: 31.5 g/dL (ref 30.0–36.0)
RBC: 5.21 MIL/uL — ABNORMAL HIGH (ref 3.87–5.11)

## 2010-12-07 LAB — APTT: aPTT: 36 seconds (ref 24–37)

## 2010-12-07 LAB — DIFFERENTIAL
Basophils Relative: 1 % (ref 0–1)
Eosinophils Absolute: 0.1 10*3/uL (ref 0.0–0.7)
Lymphs Abs: 1.7 10*3/uL (ref 0.7–4.0)
Monocytes Absolute: 0.5 10*3/uL (ref 0.1–1.0)
Monocytes Relative: 7 % (ref 3–12)
Neutrophils Relative %: 66 % (ref 43–77)

## 2010-12-07 LAB — PROTIME-INR: INR: 1.11 (ref 0.00–1.49)

## 2010-12-14 ENCOUNTER — Inpatient Hospital Stay (HOSPITAL_COMMUNITY): Payer: Medicare Other

## 2010-12-14 ENCOUNTER — Inpatient Hospital Stay (HOSPITAL_COMMUNITY)
Admission: RE | Admit: 2010-12-14 | Discharge: 2010-12-16 | DRG: 491 | Disposition: A | Discharge status: Home Health | Payer: Medicare Other | Source: Ambulatory Visit | Attending: Orthopedic Surgery | Admitting: Orthopedic Surgery

## 2010-12-14 DIAGNOSIS — K449 Diaphragmatic hernia without obstruction or gangrene: Secondary | ICD-10-CM | POA: Diagnosis present

## 2010-12-14 DIAGNOSIS — Z7982 Long term (current) use of aspirin: Secondary | ICD-10-CM

## 2010-12-14 DIAGNOSIS — Z01812 Encounter for preprocedural laboratory examination: Secondary | ICD-10-CM

## 2010-12-14 DIAGNOSIS — I10 Essential (primary) hypertension: Secondary | ICD-10-CM | POA: Diagnosis present

## 2010-12-14 DIAGNOSIS — Z794 Long term (current) use of insulin: Secondary | ICD-10-CM

## 2010-12-14 DIAGNOSIS — K5730 Diverticulosis of large intestine without perforation or abscess without bleeding: Secondary | ICD-10-CM | POA: Diagnosis present

## 2010-12-14 DIAGNOSIS — H409 Unspecified glaucoma: Secondary | ICD-10-CM | POA: Diagnosis present

## 2010-12-14 DIAGNOSIS — M129 Arthropathy, unspecified: Secondary | ICD-10-CM | POA: Diagnosis present

## 2010-12-14 DIAGNOSIS — E119 Type 2 diabetes mellitus without complications: Secondary | ICD-10-CM | POA: Diagnosis present

## 2010-12-14 DIAGNOSIS — Z79899 Other long term (current) drug therapy: Secondary | ICD-10-CM

## 2010-12-14 DIAGNOSIS — Z880 Allergy status to penicillin: Secondary | ICD-10-CM

## 2010-12-14 DIAGNOSIS — M48061 Spinal stenosis, lumbar region without neurogenic claudication: Principal | ICD-10-CM | POA: Diagnosis present

## 2010-12-14 DIAGNOSIS — M549 Dorsalgia, unspecified: Secondary | ICD-10-CM

## 2010-12-14 DIAGNOSIS — K219 Gastro-esophageal reflux disease without esophagitis: Secondary | ICD-10-CM | POA: Diagnosis present

## 2010-12-14 LAB — GLUCOSE, CAPILLARY
Glucose-Capillary: 127 mg/dL — ABNORMAL HIGH (ref 70–99)
Glucose-Capillary: 207 mg/dL — ABNORMAL HIGH (ref 70–99)

## 2010-12-14 LAB — TYPE AND SCREEN: ABO/RH(D): O POS

## 2010-12-15 LAB — GLUCOSE, CAPILLARY: Glucose-Capillary: 117 mg/dL — ABNORMAL HIGH (ref 70–99)

## 2010-12-15 LAB — BASIC METABOLIC PANEL
CO2: 27 mEq/L (ref 19–32)
Calcium: 9 mg/dL (ref 8.4–10.5)
Creatinine, Ser: 1.22 mg/dL — ABNORMAL HIGH (ref 0.4–1.2)
GFR calc Af Amer: 51 mL/min — ABNORMAL LOW (ref >60)
GFR calc non Af Amer: 42 mL/min — ABNORMAL LOW (ref >60)

## 2010-12-15 LAB — HEMOGLOBIN AND HEMATOCRIT, BLOOD
HCT: 36.8 % (ref 36.0–46.0)
Hemoglobin: 11.1 g/dL — ABNORMAL LOW (ref 12.0–15.0)

## 2010-12-15 NOTE — H&P (Addendum)
Allison Rodgers, Allison Rodgers                ACCOUNT NO.:  [id number]  MEDICAL RECORD NO.:  [id number]           PATIENT TYPE:  I  LOCATION:  0003                         FACILITY:  [facility]  PHYSICIAN:  Georges Lynch. Santasia Rew, M.D.DATE OF BIRTH:  [birth year]  DATE OF ADMISSION:  12/14/2010 DATE OF DISCHARGE:                             HISTORY & PHYSICAL   CHIEF COMPLAINT:  Low back pain  BRIEF HISTORY:  Allison Rodgers has been followed by Dr. Darrelyn Hillock for worsening pain in her low back that radiates down both legs to her feet. Dr. Darrelyn Hillock sent her for CT myelogram that revealed severe spinal stenosis at L4-L5 and a portion of L3-L4.  Allison Rodgers states that the pain is getting worse.  She has it at all times when trying to walk. She states she is ready to have surgery to correct her problem.  Allison Rodgers presents for central decompressive lumbar laminectomy at L3-L4, L4-L5.  ALLERGIES: 1. BACTRIM. 2. PENICILLIN.  PRIMARY CARE PHYSICIAN:  Casimiro Needle B. [name], MD, FCCP  CURRENT MEDICATIONS:  Nexium, Nasonex, spironolactone, furosemide, Lantus Amaryl, Citracal, iron, Lumigan, calcium carbonate, aspirin, Tylenol, tramadol, Kaopectate, fluocinonide, alprazolam, Cetirizine.  PAST MEDICAL HISTORY: 1. Hypertension. 2. Hiatal hernia. 3. Reflux disease. 4. Kidney stones. 5. Diverticulosis. 6. Phlebitis. 7. Insulin-dependent diabetes. 8. Arthritis.  PAST SURGICAL HISTORY: 1. Appendectomy. 2. Excision of tumors. 3. Left shoulder surgery.  FAMILY HISTORY:  Father passed in his 62s of cancer.  Mother passed in her 16s of a stroke.  SOCIAL HISTORY:  The patient is widowed.  She is a retired [employer] operational officer. She denies past or present use of alcohol or tobacco products.  She lives alone.  She does plan to go home following her hospital stay.  Her son, granddaughter and neighbor will be able to take care of her.  REVIEW OF SYSTEMS:  GENERAL:  Negative for fevers, chills or weight change.  She admits fatigue.   HEENT AND NEUROLOGIC:  Positive for dizziness.  DERMATOLOGIC:  Negative for rash or lesion.  RESPIRATORY: Positive for shortness of breath with exertion.  CARDIOVASCULAR: Positive for occasional peripheral edema.  Last EKG was January 2012. GI:  Positive for nausea, vomiting, constipation, heartburn and abdominal pain.  GU:  Positive for urinary frequency as well as urinating at nighttime.  MUSCULOSKELETAL:  Positive for joint pain, joint swelling, back pain and morning stiffness.  Allison Rodgers has been cleared for surgery by her primary care physician, Dr. [name].  PHYSICAL EXAMINATION:  VITAL SIGNS:  Pulse 80, respirations 18, blood pressure 132/84 in the left arm. GENERAL:  Allison Rodgers is alert, oriented x3.  She is well-developed, well- nourished, in no apparent distress.  She is accompanied today by her friend.  [name] is a pleasant 77 year old female.  She has a stated height of 5 feet and a stated weight of 196 pounds. HEENT:  Normocephalic, atraumatic.  Extraocular movements intact. NECK:  Supple.  Full range of motion without lymphadenopathy. CHEST:  Lungs are clear to auscultation bilaterally.  She has decreased breath sounds bilaterally without wheezing. HEART:  Regular rate and rhythm without murmur.  S1 and S2 sounds  are appreciated. ABDOMEN:  Bowel sounds present in all 4 quadrants.  Abdomen is soft, nontender to palpation. EXTREMITIES:  She has pain with range of motion of the lumbar spine and this reproduces her lower extremity pain. SKIN:  Unremarkable. NEUROLOGIC:  Intact peripheral vascular, carotid pulses 2+ bilaterally.  IMAGING STUDIES:  CT myelogram revealed severe spinal stenosis at L4-L5 and a portion of L3-L4.  PLAN:  Central decompressive lumbar laminectomy of a portion of L3-L4 and all of L4-L5.     [name], PAC   ______________________________ Georges Lynch Darrelyn Hillock, M.D.    LD/MEDQ  D:  12/14/2010  T:  12/14/2010  Job:   [id number]  Electronically Signed by [name]  on 12/15/2010 07:59:45 AM Electronically Signed by Ranee Gosselin M.D. on 12/21/2010 06:47:40 AM

## 2010-12-16 LAB — BASIC METABOLIC PANEL
CO2: 26 mEq/L (ref 19–32)
Calcium: 8.8 mg/dL (ref 8.4–10.5)
GFR calc Af Amer: 38 mL/min — ABNORMAL LOW (ref >60)
GFR calc non Af Amer: 32 mL/min — ABNORMAL LOW (ref >60)
Potassium: 4.1 mEq/L (ref 3.5–5.1)
Sodium: 136 mEq/L (ref 135–145)

## 2010-12-16 LAB — GLUCOSE, CAPILLARY

## 2010-12-21 NOTE — Op Note (Signed)
NAMETAJAE, MAIOLO                ACCOUNT NO.:  [id number]  MEDICAL RECORD NO.:  [id number]           PATIENT TYPE:  I  LOCATION:  1616                         FACILITY:  [facility]  PHYSICIAN:  Georges Lynch. Katina Remick, M.D.DATE OF BIRTH:  Feb 25, 1924  DATE OF PROCEDURE: DATE OF DISCHARGE:                              OPERATIVE REPORT   PREOPERATIVE DIAGNOSIS: 1. Severe spinal stenosis with a complete block at L4-5. 2. Severe spinal stenosis at L3-L4.  POSTOPERATIVE DIAGNOSIS: 1. Severe spinal stenosis with a complete block at L4-5. 2. Severe spinal stenosis at L3-L4.  OPERATION: 1. Complete decompressive lumbar laminectomy at L3-L4 for spinal     stenosis. 2. Complete decompressive lumbar laminectomy at L4-L5 for spinal     stenosis. 3. Foraminotomies bilaterally at both levels.  SURGEON:  Georges Lynch. Darrelyn Hillock, M.D.  ASSISTANT:  Marlowe Kays, M.D.  DESCRIPTION OF PROCEDURE:  Under general anesthesia, routine orthopedic prep and drape of the low back carried out.  The patient was on a spinal frame.  Prior to any incisions, the appropriate time-out was carried out.  The patient did have bilateral leg pain preop.  Under general anesthesia after the routine orthopedic prep and draping was carried out, 2 needles were placed in the back for localization purposes, x-ray was taken.  At this time, we then made an incision over L3-L4, L4-L5 space.  Bleeders identified and cauterized.  Self-retaining retractors were inserted.  I then went down to the lumbodorsal fascia, separated the muscle bilaterally from the spinous processes and lamina. Bleeders were cauterized as we went along.  I then placed instruments on the spinous processes.  We took several x-rays to localize our levels. At this time, we then began our decompressive lumbar laminectomies in the usual fashion by removing the spinous processes at L3-L4, L4-L5; microscope was brought in.  Great care was taken to protect the  dura during the entire procedure.  We used cottonoids over the dura.  At this time by use of the microscope, we then started our dissection with #2 Kerrison rongeurs.  We started proximally and went distally and went out laterally and medially until we had excellent decompression.  I noticed that all along the way we utilized hockey-stick to make sure we were far enough proximal, far enough distal and out into the foramen before we completed the decompression.  We had total freedom proximally and distally with the hockey-stick being used and we then knew that we had the decompression complete, the dura was free.  The ligamentum was extremely thickened in the area as well.  Once this was complete, we thoroughly irrigated out the area and as I stated several x-rays were taken so we knew where to go as far as proximal and distal limits.  We then injected 10 cc of FloSeal over the dura and then I utilized thrombin-soaked Gelfoam laterally away from the dura.  I then closed the wound in layers in usual fashion except I left a small distal deep and proximal part of the wound open for drainage purposes.  Subcu was closed with 0 Vicryl, skin with metal staples.  Sterile  Neosporin dressing was applied.  The patient left the operating room in satisfactory condition.          ______________________________ Georges Lynch Darrelyn Hillock, M.D.     RAG/MEDQ  D:  12/14/2010  T:  12/15/2010  Job:  [id number]  cc:   Windy Fast A. Darrelyn Hillock, M.D. Fax: [phone]  [name]. [name], MD, FCCP [address] [place] [address]  Electronically Signed by Ranee Gosselin M.D. on 12/21/2010 06:47:42 AM

## 2010-12-24 ENCOUNTER — Other Ambulatory Visit: Payer: Self-pay | Admitting: Internal Medicine

## 2010-12-25 NOTE — Assessment & Plan Note (Signed)
[facility]                             PULMONARY OFFICE NOTE   Allison Rodgers, Allison Rodgers                       MRN:          [id number]  DATE:04/14/2007                            DOB:          [birth year]    SUBJECTIVE:  This 77 year old black female, exceptionally complicated,  with multiple complaints, most of which relate to morbid obesity with  diabetes, hypertension and degenerative arthritis.  Her other chronic  complaint was constipation, and now is having difficulty controlling her  bowels.  On Amitizia 25 mcg twice daily.   CURRENT MEDICATIONS:  For a full review of all of her medications,  please see the medication face sheet dated April 14, 2007.  The  patient is taking her medications consistently.   The problem she is having now is that she is having symptoms of  hypoglycemia that occur three to four times a week, mostly during the  day, and have not improved since reducing Lantus down to 10 units daily.  These typically occur between breakfast and supper and immediately  consist of feeling shaky, nervous and trembling, and improve  immediately after drinking orange juice and eating crackers.   The patient is also complaining of increasing arthritis pain, which she  is no longer able to control with Tylenol and has been using more  Darvocet.  She also has Vicodin for severe pain, but has not resorted to  this yet.  She is having joint stiffness, especially in the morning.   PHYSICAL EXAMINATION:  GENERAL:  She is a pleasant ambulatory obese  black female who is at 204 pounds, up another 2 pounds.  VITAL SIGNS:  Blood pressure 120/70.  HEENT:  Unremarkable.  NECK:  Veins clear.  LUNGS:  Fields are clear bilaterally to auscultation and percussion.  HEART:  A regular rhythm without murmur, gallop or rub.  ABDOMEN:  Soft, benign.  EXTREMITIES:  Warm without calf tenderness, cyanosis or clubbing.   IMPRESSION:  I have an extended  discussion with this patient which  lasted for 15-25 minutes.  It was based on the following topics:  1. Hypertension, adequately controlled on the present regimen, which I      did not change.  She does have a tendency for fluid retention and      has the option to take an extra dose of Furosemide.  2. Degenerative arthritis, especially in the knees, ankles and wrists.      I have recommended a re-challenge of her sulindac 200 mg twice      daily with meals prn and have added this to her list.  3. Hypoglycemic episodes, probably from taking Lantus plus Glucovance.      I have switched her Lantus therefore to 5 units at bedtime, and      will need to titrate this off, depending upon what her fasting      blood sugars do and whether or not she continues to have      hypoglycemic episodes.  It does not make any sense for her to      continue to have  to eat her way out of hypoglycemia, because we      are actually trying to help her lose weight, which is actually her      primary medical problem and has not  yet been addressed in a      convincing fashion by this patient.  4. Frequent bowel movements on Amitiza.  I have advised her to seek      Dr. Vania Rea. [name]'s advice regarding titration of Amitiza.   FOLLOWUP:  I will see the patient in three months, for a comprehensive  health care evaluation or sooner if needed.   A hemoglobin A1c and a fasting BMET are pending from this morning.     [name]. [name], MD, [facility]  Electronically Signed    MBW/MedQ  DD: 04/14/2007  DT: 04/14/2007  Job #: [id number]

## 2010-12-25 NOTE — Assessment & Plan Note (Signed)
[facility]                             PULMONARY OFFICE NOTE   Allison Rodgers, BRENES                       MRN:          [id number]  DATE:07/15/2007                            DOB:          1924/03/06    PRIMARY SERVICE COMPREHENSIVE HEALTH CARE EVALUATION:   CHIEF COMPLAINT:  Dizziness.   HISTORY:  This is an 77 year old black female, who is intermittently  having sensation of light-headedness and shakiness that she attributes  to low blood sugars.  They typically occur in the mornings or before  lunch, but the lowest sugar she has detected is only 80.  They also  occur if she walks too much.  She does have a pill that she says she  can take when her sugars get too low, but does not identify it and it  is not on her comprehensive medication calendar.  She denies any  polydipsia or polyuria, fevers, chills, sweats or history of alcohol use  or liver disease.   She also denies any other neurologic complaints, including any visual  change, numbness, weakness or difficulty with speech during her  spells, which occur several times weekly and have been going on for  over a year.  On this basis, I have been decreasing her Lantus, which  she says didn't help, down to now 5 units daily.   PAST MEDICAL HISTORY:  1. Morbid obesity, complicated by diabetes and hypertension, as well      as hyperlipidemia.  2. Rectal bleeding with upper and lower endoscopy, January 2005,      showing diverticulosis, as well as gastritis.  3. History of borderline iron deficiency, secondary to #1.  4. Chronic functional constipation for which she has been followed by      GI, Dr. Jarold Motto.  5. Degenerative arthritis, status post right rotator cuff repair, July      2005, followed by Dr. Teressa Senter.  6. Status post total abdominal hysterectomy, bilateral salpingo-      oophorectomy, remotely.  7. Chronic microcytic indices, consistent with thalassemia minor      trait.  8.  Diabetes mellitus, diagnosed September 2005.   ALLERGIES:  PENICILLIN and BACTRIM cause a rash.   MEDICATIONS:  Taken in detail on the work sheet, dated July 15, 2007,  noting that she has reduced her Lantus down to 5 units at bedtime and  takes a sugar pill when she is feeling bad, but cannot identify it.  Sometimes it works, sometimes it does not.   SOCIAL HISTORY:  She has never smoked.  She has a history of social  drinking only.  She is a retired [employer] operational officer.   FAMILY HISTORY:  Positive for asthma in her brother, who also has  hypertension.  Mother died of stroke.  Father had multiple myeloma.  Family history is negative for colon or breast cancer or premature heart  disease, to her knowledge.   REVIEW OF SYSTEMS:  Taken in detail on the work sheet.  Negative, except  as outlined above.   PHYSICAL EXAMINATION:  This is a pleasant, ambulatory black female, in  no acute distress.  She is afebrile, normal vital signs.  Weight is up from 186 to 200 over  the last year.  Height is unchanged at 5 feet, even.  HEENT:  Optic exam was done with limited funduscopy, revealing no  obvious arterial change or diabetic change.  Oropharynx is clear.  Dentition is intact.  Ear canals are clear bilaterally.  NECK:  Supple, without cervical adenopathy or tenderness.  Carotid  upstrokes are brisk without any bruits.  CHEST:  Completely clear bilaterally to auscultation and percussion.  Breasts without masses, nipple or skin changes.  No axillary nodes.  Regular rhythm without murmur, gallop or rub.  No displacement of PMI.  ABDOMEN:  Soft, benign and obese, but no palpable organomegaly, masses  or bruits.  EXTREMITIES:  Warm without calf tenderness, cyanosis, clubbing.  There  was symmetric decrease in pulses bilaterally.  NEUROLOGIC:  No focal deficits or pathologic reflexes.  She had normal  finger-to-nose, no obvious nystagmus and normal gait with negative  Trendelenburg.  SKIN EXAM:   Warm and dry.  MUSCULOSKELETAL EXAM:  Unremarkable.   LABORATORY DATA:  Fasting blood sugar was 128 with a hematocrit of 39  and an MCV of 77.8.  Total cholesterol was 161 with an LDL of 96, HDL of  49, TSH was normal.  CRP was 6.  Urinalysis was unremarkable with no  evidence of proteinuria.   IMPRESSION:  1. Dizzy spells may not be related to hypoglycemia.  It is hard to      believe that a blood sugar of 80 could do this.  I have advised her      to return in one week to review her labs, which should include a      hemoglobin A1c, if not obtained on her previous visit.  It may well      be that she does not need any Lantus at all at this point.  Note      that she has gained weight on the Lantus and may be better off      without it.  2. Hyperlipidemia.  Target LDL less than 80, based on diabetes.  The      key here is working harder on calorie balance, as discussed above.  3. Tendency to peripheral edema, well-controlled on present regimen.  4. Chronic microcytic indices, consistent with thalassemia minor      trait.  5. General health maintenance.  She receives mammograms yearly through      Dr. Shirlee More office.  Flu shot was given today.  The last tetanus      was in 2004.  6. History of degenerative arthritis, status post cuff repair in 2005.      She seems somewhat better, using sulindac 200 mg Allison.i.d. p.r.n.   I have asked her to return in a week to follow up these labs and to see  if there is a more definite pattern in terms of her dizzy spells that  she can correlate with eating and, when she returns,  show [place] the sugar pill she says she takes for these attacks to make  sure it is appropriate.  If  we cannot tie her blood sugars to these spells (which I tried to do  today, but was unsuccessful), it may well be a neurologic workup is in  order.     [name]. [name], MD, [facility]  Electronically Signed    MBW/MedQ  DD: 07/17/2007  DT: 07/17/2007  Job #:  [id number] 

## 2010-12-25 NOTE — Assessment & Plan Note (Signed)
[facility]                             PULMONARY OFFICE NOTE   RAPHAELLA, LARKIN                       MRN:          [id number]  DATE:01/12/2007                            DOB:          28-Mar-1924    PRIMARY SERVICE EXTENDED FOLLOWUP OFFICE VISIT:   HISTORY:  This 77 year old black female returns, having undergone  medication reconciliation with the nurse practitioner.  Her main  complain is one of worsening constipation.  Note that it was recommended  she stay on Citrucel one tablespoon daily along with Amitiza 25 mcg  b.i.d. with followup planned with Dr. Jarold Motto.  However, she has not  actually followed the instructions and wants to know what else she can  do for her constipation.  She says the constipation issue has been  lifelong, but worse over the last month.  She complains of abdominal  bloating, but no fevers, chills, sweats or nausea or unintended weight  loss.   She has had several sweating spells that don't appear to correlate with  eating or cbg's. Fasting cbg's are consistently in the 120-160 range.   PAST MEDICAL HISTORY:  Reviewed with the patient and included an  extensive workup for constipation, showing only diverticulosis by most  recent studies done by colonoscopy, January 2005.   ALLERGIES:  None known.   MEDICATIONS:  Taken in detail on the work sheet column dated January 12, 2007.   SOCIAL HISTORY:  She has never smoked.   FAMILY HISTORY:  Negative for colon cancer.   REVIEW OF SYSTEMS:  Taken in detail and negative, except as outlined  above.   PHYSICAL EXAMINATION:  This is an ambulatory, obese black female in no  acute distress.  She had stable vital signs.  HEENT:  Unremarkable.  Oropharynx clear.  NECK:  Supple with no cervical adenopathy or tenderness.  Trachea is  midline with no thyromegaly.  Lung fields are completely clear bilaterally to auscultation and  percussion.  There is a regular rhythm  without murmur, gallop or rub.  ABDOMEN:  Soft and obese, but benign.  EXTREMITIES:  Warm without calf tenderness, cyanosis, clubbing or edema.   IMPRESSION:  1. Chronic constipation.  Some of her sweating spells may be due to      irritable bowel syndrome rather than hypoglycemia, based on the      above concerns.  I recommended that she take the Citrucel perfectly      regularly along with the Amitiza and then see Dr. Jarold Motto if      still concerned about constipation for either a change in her      maintenance medicines or a new as-needed (note from her as-needed      list, she has no medicines listed for constipation at this point).  2. Diabetes mellitus actually appears well-controlled.  I do not      believe the sweating spells that she is having have anything to      do with hypoglycemia.  I did emphasize to her how to tell the      difference (prick her  finger when she is symptomatic or eat      something sweat to see if the spells go away).  We will check a      hemoglobin A1c today along with a BMET, looking for adverse drug      effect from the complicated medical regimen she is on.  3. Complex medical regimen.  I went over each and every one of her      medicines line by line today to help her understand that we mean      what we say and we say what we mean in terms of the medication      calendar; that is, if we list the medicine as a maintenance, she      should stay on it until such time as she either sees [place] back or      calls [place] with concern for which the maintenance medicines could be      adjusted; that is, she should not ad lib a maintenance medicine,      which is exactly what she did.  The ad lib and as-needed      medicines are all listed separately by symptom and I would like her      to follow this list to the letter until she returns for her      followup or one of [place] changes it to avoid adverse drug effect.     [name]. [name], MD, [facility]  Electronically  Signed    MBW/MedQ  DD: 01/12/2007  DT: 01/13/2007  Job #: [id number]   cc:   Vania Rea. Jarold Motto, MD, [facility], FAGA

## 2010-12-26 ENCOUNTER — Other Ambulatory Visit: Payer: Self-pay | Admitting: *Deleted

## 2010-12-26 MED ORDER — GLIMEPIRIDE 2 MG PO TABS: ORAL_TABLET | ORAL | Status: DC

## 2010-12-28 NOTE — Assessment & Plan Note (Signed)
[facility]                         GASTROENTEROLOGY OFFICE NOTE   Allison Rodgers, Allison Rodgers                       MRN:          [id number]  DATE:10/14/2006                            DOB:          16-Sep-1923    Allison Rodgers is an elderly black female with morbid obesity and essential  hypertension, degenerative joint disease and adult onset diabetes. I  have seen her for many years because of chronic acid reflux that is  managed well on daily Nexium. She has chronic functional constipation  and known diverticulosis coli. She has recently had increasing  constipation with crampy lower abdominal pain but denies rectal  bleeding. As mentioned above, she is diabetic and on insulin. Her last  colonoscopy exam was approximately 3-4 years. She did have repair of a  ventral hernia that was discovered by abdominal CT scan in December  2006. This was done by Dr. Cyndia Rodgers in January 2007. She has a  long history of recurrent rectal bleeding from hemorrhoids, she has had  multiple surgery procedures additionally including total abdominal  hysterectomy and bilateral oophorectomy. She also has chronic microcytic  indices with thalassemia trait hemoglobin disorder.   She denies nausea and vomiting, hepatobiliary complaints, fever or  chills. She said her appetite is good, her weight is stable and she has  had no major changes in her diet. She is taking Nexium 40 mg a day,  spironolactone 50 mg twice a day, Lasix 40 mg a day, Lantus insulin 10  units in the morning, Glucovance 2.5-500 mg a day and Relafen 500 mg  twice a day. She in the past has had reaction to penicillin and Bactrim.   PHYSICAL EXAMINATION:  Healthy-appearing black female in no acute  distress. She weighs 200 pounds and her blood pressure is 120/60, pulse  is 80 and regular. I could not appreciate hepatosplenomegaly, abdominal  masses or tenderness. I could not appreciate any abdominal wall  hernias.  Bowel sounds were normal. Rectal exam was deferred.   ASSESSMENT:  1. Worsening constipation with associated diverticulosis. I suspect      some of her constipation is related to her diabetes and an      autonomic neuropathy.  2. Well controlled chronic acid reflux.  3. Adult onset diabetes.  4. Hypertensive cardiovascular disease.  5. Status post multiple pelvic surgical procedures.   RECOMMENDATIONS:  1. Trial of Amitiza 24 mcg twice a day with meals.  2. High fiber diet and liberal p.o. fluids as tolerated.  3. Continue reflux regime and daily Nexium.  4. Outpatient colonoscopy followup.     Allison Rea. Jarold Motto, MD, [facility], FAGA  Electronically Signed    DRP/MedQ  DD: 10/14/2006  DT: 10/14/2006  Job #: [id number]   cc:   Allison Dalton. [name], MD, FCCP

## 2010-12-28 NOTE — Assessment & Plan Note (Signed)
[facility]                         GASTROENTEROLOGY OFFICE NOTE   LEVENIA, SKALICKY                       MRN:          [id number]  DATE:11/17/2007                            DOB:          [birth year]    Allison Rodgers is a 77 year old black female with insulin-dependent diabetes  referred today by Dr. [name] for evaluation of worsening reflux symptoms  and dysphagia. There are extensive records and notes from Dr. [name] and  very thick and detailed chart and labs which were reviewed and  processed.   Allison Rodgers has had chronic GERD for many years and has been on PPI  therapy. She has worsening reflux symptoms and some intermittent solid  food dysphagia. Her last endoscopy was in January 2005 at which time she  had a prominent hiatal hernia and also a small duodenal nodule was  biopsied. We cannot find the pathology report at this time. She also has  extensive diverticulosis with chronic constipation, abdominal gas and  bloating. She is on fiber supplements with the last colonoscopy in  January 2005. She says her appetite is good, and her weight is stable.  She denies melena or hematochezia.   PAST MEDICAL HISTORY:  1. Adult-onset diabetes.  2. Hypertensive cardiovascular disease.  3. Obesity.  4. Degenerative joint disease.  5. Chronic fatigue.  6. Chronic abdominal pain.  7. She had repair of an umbilical and ventral hernia by Dr. Cicero Duck in January 2007 after CT scan showed a ventral defect in her      abdomen.  8. She also has had other extensive abdominal surgery including      appendectomy, total abdominal hysterectomy and bilateral      oophorectomy, and laparoscopic cholecystectomy done in 1999.   MEDICATIONS:  1. Nexium 40 mg a day.  2. Spironolactone 100 mg twice a day.  3. Lasix 40 mg a day.  4. Lantus insulin 10 units a day.  5. Glucovance 5 mg/500 mg 1/2 tablet a day.  6. Daily Citrucel and Amitiza 24 mcg twice a day.  7.  Uses p.r.n. Darvocet-N 100, Vicodin, and Sulindac 200 mg.   ALLERGIES:  In the past she has had reactions to PENICILLIN and BACTRIM.   LABORATORY DATA:  Recent lab data from October 29, 2007, showed a normal  metabolic profile, hemoglobin A1C 6.6%.   PHYSICAL EXAMINATION:  VITAL SIGNS:  She weighs 199 pounds, and blood  pressure is 140/64. Pulse was 68 and regular. I could not appreciate  stigmata of chronic liver disease.  CHEST:  Clear.  CARDIAC:  Regular rhythm without murmurs, gallops, or rubs.  ABDOMEN:  No organomegaly, but there is suggestion of fullness in the  lower mid quadrant area of her abdomen without any definite herniations.  Bowel sounds were nonobstructed.  PERIPHERAL EXTREMITIES:  Otherwise unremarkable.  RECTAL:  Deferred.  MENTAL STATUS:  Clear.   ASSESSMENT:  1. Worsening acid reflux with probable peptic stricture to the      esophagus.  2. The patient is due for followup colonoscopy for her diverticulosis  and abnormal lower abdominal exam. She has chronic idiopathic      constipation and is doing fairly well on fiber supplements.  3. Insulin-dependent diabetes.  4. Well-controlled essential hypertension.  5. Chronic pain syndrome.  6. Status post multiple surgical procedures.   RECOMMENDATIONS:  1. Reflux regime reviewed with the patient, and we will increase      Nexium to 40 mg twice a day until endoscopy and dilation can be      completed.  2. Continue high-fiber diet, Citrucel and liberal p.o. fluids.  3. Followup colonoscopy exam.  4. Continue other multiple medications as per Dr. [name]. We will make      adjustments in her diabetic medications for her procedures.     Vania Rea. Jarold Motto, MD, [facility], FAGA  Electronically Signed    DRP/MedQ  DD: 11/17/2007  DT: 11/17/2007  Job #: [id number]

## 2010-12-28 NOTE — Assessment & Plan Note (Signed)
[facility]                               PULMONARY OFFICE NOTE   Allison Rodgers, Allison Rodgers                       MRN:          [id number]  DATE:05/02/2006                            DOB:          Apr 27, 1924    HISTORY OF PRESENT ILLNESS:  The patient is an 77 year old African-American  female patient of Dr. Thurston Hole who was seen in the office yesterday for a  routine office visit.  Laboratory work revealed a significantly elevated  blood sugar at 485.  The patient has a known history of diabetes mellitus,  which has been diet controlled.  The most recent A1C prior to yesterday's  visit was 5.8 on May 02, 2005.  The patient does report over the last  several weeks she has noticed some slight increase in polydipsia and  polyuria.  An A1C was at 11.9.  The patient denies any nausea, vomiting,  chest pain, orthopnea, PND, or leg swelling.   PAST MEDICAL HISTORY:  Reviewed.   CURRENT MEDICATIONS:  Reviewed.   PHYSICAL EXAMINATION:  GENERAL:  The patient is a pleasant elderly female in  no acute distress.  VITAL SIGNS:  She is afebrile with stable vital signs.  She is saturating  97% on room air.  HEENT:  Unremarkable.  NECK:  Supple without adenopathy.  LUNGS:  Lung sounds are clear to auscultation.  CARDIAC:  Regular rate and rhythm.  ABDOMEN:  Morbidly obese and soft.  EXTREMITIES:  Warm without any edema.   IMPRESSION AND PLAN:  Diabetes mellitus with significant hyperglycemia.  The  patient will be started on insulin therapy with Lantus insulin 20 units to  be given daily.  The Lantus __________ was given to the patient with a first  injection of 20 units given in the office by the patient with correct return  demonstration.  Patient education was provided along with dietary  information, as well.  The patient also will begin Glucovance 2.5/500 twice  daily.  The patient is advised to take with meals, and to call if she has a  blood sugar less  than 80 or blood sugars that persistently are elevated over  500.  The patient also is advised if she begins to have any nausea, vomiting  or inability to keep down food or liquids they are to contact our office  immediately or go to the closest emergency department.  The patient was  given a prescription for a Glucometer to check blood sugars once a day  fasting and keep track of this.  We will see the patient back here in the  office in 4 days, or sooner if needed.                                  Allison Oaks, NP                                Allison Rodgers. [name], MD, FCCP   TP/MedQ  DD:  05/02/2006  DT:  05/05/2006  Job #:  [id number]

## 2010-12-28 NOTE — Assessment & Plan Note (Signed)
[facility]                             PULMONARY OFFICE NOTE   Allison, Rodgers                       MRN:          [id number]  DATE:10/13/2006                            DOB:          20-Oct-1923    This 77 year old black female complaining of weight gain with  swelling.  She has had problems with extremity swelling dating back  several years and I found out today actually has not been compliant with  the medication regimen that she has been given which includes a very  unambiguous and user friendly medication calendar.  This emphasizes the  use of spironolactone 50 mg 2 a.m. and 2 at lunch time but the patient  says she frequently skips it because it is not convenient to take it  because of excess urine.  She comes in today for fasting blood sugar  having already eaten cereal 2 hours ago to follow up on type 2 diabetes  diagnosed in August of 2001.   For full medications, please see face sheet dated October 13, 2006.   On physical examination, she is a pleasant, overweight black in no acute  distress.  She had normal vital signs.  Her weight is 200 pounds which  is no change from baseline.  Blood pressure is 124/66.  HEENT is  unremarkable.  Pharynx is clear.  Lung fields reveal diminish breath  sounds but no wheezing.  There is regular rhythm without murmur, gallop  or rub.  Abdomen is soft and benign.  Extremities are warm without calf  tenderness.  No cyanosis or clubbing.  There was 2+ pitting edema from  the knees down bilaterally.   IMPRESSION:  1. Chronic severe lower extremity edema related to obesity.  I      emphasized to the patient that spironolactone should be taken 50 mg      tablets 2 in the morning and 2 at lunch time just exactly the way      it is on the calendar as a preventive and not has a powerful      diuretic.  If she cannot afford to be near a bathroom she can      hold the daily furosemide until she is near a bathroom  and      obviously has another dose of furosemide she can take as well if      she finds the swelling uncomfortable.  2. The main issue this patient has that has not been addressed is not      a medication deficiency but rather nonadherence to diet.  I have      recommended that she return to see her nutritionist, Allison Rodgers, at [facility] for a specific feedback regarding food diary which should      also review not only total caloric intake but also salt intake.  3. Finally to improve calorie balance, she needs to restart water      aerobics.   Follow-up here will be in 6 weeks for purposes of medication  reconciliation and 12 weeks for regular medical  follow-up with  hemoglobin A1c and fasting BMET pending.     Allison Rodgers. Allison Sires, MD, [facility]  Electronically Signed   MBW/MedQ  DD: 10/13/2006  DT: 10/13/2006  Job #: [id number]   cc:   Allison Rodgers Nutrition Allison Rodgers

## 2010-12-28 NOTE — Assessment & Plan Note (Signed)
[name]                               PULMONARY OFFICE NOTE   Allison Rodgers, Allison Rodgers                       MRN:          [id number]  DATE:06/02/2006                            DOB:          [birth year]    HISTORY OF PRESENT ILLNESS:  This is a very pleasant 77 year old African-  American female patient of Dr. Thurston Hole who returns for a 13-month followup.  Last visit, the patient's blood sugar had been significantly elevated with a  blood sugar of 485 with a hemoglobin A1C of 11.9.  The patient was started  on Lantus insulin at 20 units and Glucovance 2.5/5 twice daily.  The patient  returns today reporting that she has been exceptionally well and tolerating  medications well.  Her blood sugars have decreased nicely over the last  month with blood sugars averaging 115-224, and no hypoglycemic episodes.   PHYSICAL EXAMINATION:  GENERAL:  The patient is a pleasant female in no  acute distress.  VITAL SIGNS:  She is afebrile with stable vital signs.  Oxygen saturation is  98% on room air.  HEENT:  Unremarkable.  NECK:  Supple with adenopathy.  LUNGS:  The lung sounds are clear.  CARDIAC:  Regular rate and rhythm.  ABDOMEN:  Soft and nontender.  EXTREMITIES:  Without any edema.   IMPRESSION AND PLAN:  Recently diagnosed diabetes mellitus with significant  hyperglycemia.  The patient will continue on her current regimen.  A  hemoglobin A1C and BMET are currently pending, and will followup  accordingly.  The patient will recheck here in 6-8 weeks, or sooner if  needed.      ______________________________  Rubye Oaks, NP    ______________________________  [name]. [name], MD, [facility]     TP/MedQ  DD:  06/03/2006  DT:  06/04/2006  Job #:  [id number]

## 2010-12-28 NOTE — Assessment & Plan Note (Signed)
[facility]                               PULMONARY OFFICE NOTE   SERRINA, MINOGUE                       MRN:          [id number]  DATE:05/01/2006                            DOB:          05-18-1924   HISTORY OF PRESENT ILLNESS:  An 77 year old, black female with morbid  obesity complicated by hypertension, borderline diabetes and GERD.  She  complains of worsening sensation of nervousness that she attributes to  chronic back pain and is no longer able to take sulindac because she says it  hangs up in her throat.  She has also noticed increased sensation of a  sour taste in her throat despite taking Aciphex 20 mg every morning.   The patient has documented hiatal hernia with GERD by most recent upper  endoscopy in 1999.   PHYSICAL EXAMINATION:  GENERAL:  She is a pleasant, ambulatory, black female  in no acute distress.  She actually lost 13 pounds from previous visit down  to 183 now.  VITAL SIGNS:  Afebrile with normal vital signs.  HEENT:  Unremarkable.  NECK:  Clear.  LUNGS:  Clear to bilaterally to auscultation and percussion.  CARDIAC:  Regular rhythm without murmurs, rubs or gallops.  ABDOMEN:  Obese.  EXTREMITIES:  Without calf tenderness, cyanosis, clubbing or significant  edema.   IMPRESSION:  1. Morbid obesity, appears to be improving with diet.  2. Reflux may be worsening and recommend switching from Aciphex to Nexium      40 mg q.a.m. with consideration of adding Reglan 10 mg tablets 1/2      before meals at bedtime if this does not eliminate her sensation of      pills hanging in her throat or sour taste in her mouth.  I also      reviewed with her dietary issues.  3. Chronic back pain appears to be with previously documented      radiculopathy involving the L4-L5 nerve root.  This problem is      aggravating the patient and especially since she is not able to take      sulindac because the pill hangs in her throat.   Hopefully, with the      Nexium she will have an easier time swallowing and will be able to use      the sulindac.  Ultimately, we may need to consider more aggressive      treatment if Dr. Jordan Likes feels it would be of benefit and she remains      refractory to medical therapy.  4. Hyperlipidemia.  5. Borderline diabetes and hypertension appear to be well-controlled on      present regimen.  I did recommend lab profile today and also return      here within the next 8 weeks for a      comprehensive health care evaluation which will include a fasting lipid      profile which we cannot do today as she has already eaten.  [name]. [name], MD, [facility]  MBW/MedQ  DD:  05/01/2006 DT:  05/02/2006 Job #:  [id number]   cc:   Henry A. Pool, M.D.

## 2010-12-28 NOTE — Assessment & Plan Note (Signed)
[facility]                             PULMONARY OFFICE NOTE   CHARLA, CRISCIONE                       MRN:          [id number]  DATE:07/14/2006                            DOB:          Jan 12, 1924    HISTORY OF PRESENT ILLNESS:  This is an 77 year old white female patient  of Dr. Thurston Hole who has a known history of hypertension, degenerative  arthritis and diabetes mellitus. The patient returns for a 2 week  followup. Last visit patient had been complaining of some suspected  hypoglycemia and her Lantus insulin was decreased down to 15 units. The  patient reports she has had no other blood sugars below 80; however, has  felt somewhat shaky at times but did not check her blood sugar. The  patient denies any chest pain, shortness of breath, abdominal pain,  nausea, vomiting, or increased leg swelling. The patient had been  recommended last time to also limit her use of sulindac since she has a  tendency toward peripheral edema.   PAST MEDICAL HISTORY:  Reviewed.   CURRENT MEDICATIONS:  Reviewed.   PHYSICAL EXAMINATION:  GENERAL:  The patient is a pleasant female in no  acute distress.  VITAL SIGNS:  She is afebrile with stable vital signs.  HEENT:  Unremarkable.  NECK:  Supple without adenopathy.  LUNGS:  Lung sounds are clear.  CARDIAC:  Regular rate.  ABDOMEN:  Soft and benign.  EXTREMITIES:  Warm without any calf tenderness, clubbing or edema.   IMPRESSION/PLAN:  1. Diabetes mellitus with last A1c of 7.3 on June 02, 2006. The      patient may be experiencing some mild hypoglycemic episodes and may      be able to totally discontinue her Lantus insulin. Will check a      hemoglobin A1c and a BMET today.  2. Dyslipidemia. The patient will return for a fasting lipid panel      with a LDL goal of less than 70. The patient is encouraged on      dietary measures.  3. Peripheral edema well controlled on Lasix and spironolactone. The  patient is to decrease      her sulindac use as much as possible. The patient will return back      with Dr. [name] in 2-3 months or sooner if needed.      Rubye Oaks, NP  Electronically Signed      [name]. [name], MD, [facility]  Electronically Signed   TP/MedQ  DD: 07/15/2006  DT: 07/15/2006  Job #: [id number]

## 2010-12-28 NOTE — Op Note (Signed)
NAME:  Allison Rodgers, Allison Rodgers                          ACCOUNT NO.:  [id number]   MEDICAL RECORD NO.:  [id number]                   PATIENT TYPE:  AMB   LOCATION:  DSC                                  FACILITY:  [facility]   PHYSICIAN:  Katy Fitch. Naaman Plummer., M.D.          DATE OF BIRTH:  1924/04/06   DATE OF PROCEDURE:  02/28/2004  DATE OF DISCHARGE:                                 OPERATIVE REPORT   PREOPERATIVE DIAGNOSIS:  Chronic adhesive capsulitis, left shoulder, with  acromioclavicular joint arthropathy and chronic stage 2 impingement.   POSTOPERATIVE DIAGNOSIS:  Chronic adhesive capsulitis, left shoulder, with  acromioclavicular joint arthropathy and chronic stage 2 impingement.   OPERATION:  1. Examination of left shoulder under anesthesia.  2. Arthroscopic anterior capsulectomy with tenolysis of subscapularis tendon     and removal of scar from rotator interval and superior recess.  3. Manipulation of left shoulder, increasing range of motion as noted in     operative note.  4. Subacromial decompression with bursectomy, coracoacromial ligament     release and acromioplasty.  5. Arthroscopic distal clavicle resection.   OPERATING SURGEON:  Katy Fitch. Sypher, M.D.   ASSISTANT:  Jonni Sanger, P.A.   ANESTHESIA:  General endotracheal supplemented by interscalene block,  supervising anesthesiologist is Kaylyn Layer. Michelle Piper, M.D.   INDICATIONS:  [name] is a 77 year old right-hand dominant woman who  has a history of pain in her right shoulder dating back to December 2004.   She was referred for evaluation and management by Dr. [name] and was noted to  have signs of adhesive capsulitis.  She initially was referred to therapy  and made little progress.  She then had an MRI, which documented significant  AC arthropathy and tendinosis of the rotator cuff.  She had signs of  capsular scarring.   After approximately six months of therapy and medical management of her  shoulder as well  as a neurosurgical evaluation evaluating degenerative disk  disease of the cervical spine, she was advised by Dr. Franky Macho, her  neurosurgeon, that intervention in her neck was not indicated.  She was,  however, advised to return for shoulder rehabilitation and probable  arthroscopy at this time.   A recent examination revealed persistent adhesive capsulitis with loss of  elevation at 135 degrees, external rotation limited to 60 degrees and  internal rotation to the iliac crest.  We recommended intervention at this  time.  Preoperatively she was advised of the potential risks and benefits of  surgery as well as the anticipated outcome.   PROCEDURE:  [name] was brought to the operating room and placed in the  supine position on the operating table.  Following induction of general  orotracheal anesthesia, she was carefully positioned in the beach chair  position with the aid of a torso and head holder designed for shoulder  arthroscopy.   Examination of the left shoulder under anesthesia revealed  elevation limited  to 140 degrees, external rotation limited at 60 degrees, internal rotation  limited at 10 degrees at 90 degrees abduction.   After gentle manipulation we increased the elevation to 170, external  rotation to 90, internal rotation to approximately 60.   Prior to proceeding with surgical prep, the left hand was prepped with  alcohol and the left ulnar bursa injected with 2.5 mL of a mixture of 1 mL  of Depo-Medrol and 1.5 mL of 1% plain lidocaine for treatment of a  concurrent left carpal tunnel syndrome.   Thereafter the left forequarter and arm were prepped with Duraprep and  draped with impervious arthroscopy drapes.   The procedure commenced with distention of the shoulder with 20 mL of  sterile saline utilizing a 20-gauge spinal needle brought in posteriorly.  The scope was introduced atraumatically with blunt technique through a  standard posterior portal.   Diagnostic arthroscopy revealed profound intra-  articular adhesions and granulation tissues compatible with chronic adhesive  capsulitis.   Photographic documentation of the extensive granulations was accomplished.   An anterior portal was created under direct vision, followed by use of a  suction shaver and bipolar cautery to ablate the capsular adhesions and to  remove most of the scar, tenodesing the subscapularis tendon and removing  the folds of the inferior recess.   The rotator interval was extensively cleaned of granulation tissues and  contractures, as was the superior recess.   Photographic documentation of the before and after appearance of the capsule  was accomplished.   At the conclusion of the debridement, the subscapularis tendon was tenolysed  and fully visible on its anterior and posterior surfaces and fully mobile.   The arthroscopic equipment was removed from the shoulder and placed in the  subacromial space.  There was a florid bursitis noted with extensive  granulation tissues.   A thorough bursectomy was accomplished, followed by identification of the  coracoacromial ligament.  This was considerably ossified at its anterior  edge, causing a lip on the acromion.  This was taken down with the cutting  cautery, followed by use of a suction bur to level the acromion to a type 1  morphology.   A generous acromioplasty was accomplished anteriorly adjacent to the [facility]  joint.   The distal clavicle was then removed arthroscopically and brought back  approximately 15 mm from the [facility] joint capsule.   The subacromial space was thoroughly debrided of all fragments of bone,  bursa, and fibrous tissue.  Hemostasis was achieved with bipolar cautery.   The rotator cuff tendons were inspected on the bursal surface and found to  be intact.   After hemostasis the arthroscopic equipment was removed and the arthroscopic portals repaired with mattress suture of 3-0  Prolene.   The portals were then dressed with Xeroflo, sterile gauze, and a Hypafix  dressing.   For aftercare Ms. Effinger is transferred to the recovery room for observation  of her vital signs.  We will transfer her to the recovery care center for  overnight observation with IV antibiotic therapy in the form of vancomycin 1  g IV q.8h. x3 doses and appropriate analgesics in the form of IV, PCA  morphine, p.o. and IV Dilaudid.  Katy Fitch Naaman Plummer., M.D.    RVS/MEDQ  D:  02/28/2004  T:  02/28/2004  Job:  [id number]   cc:   [name]. [name], M.D. [facility]

## 2010-12-28 NOTE — Op Note (Signed)
NAMEGABRIELLY, MCCRYSTAL                ACCOUNT NO.:  [id number]   MEDICAL RECORD NO.:  [id number]          PATIENT TYPE:  AMB   LOCATION:  DSC                          FACILITY:  [facility]   PHYSICIAN:  Currie Paris, M.D.DATE OF BIRTH:  [birth year]   DATE OF PROCEDURE:  08/23/2005  DATE OF DISCHARGE:                                 OPERATIVE REPORT   OFFICE MEDICAL RECORD NUMBER [id number]   PREOP DIAGNOSIS:  Small ventral incisional hernia.   POSTOPERATIVE DIAGNOSIS:  Small ventral incisional hernia.   OPERATION:  Repair with Bard ventral X mesh patch.   SURGEON:  Currie Paris, M.D.   ANESTHESIA:  General.   CLINICAL HISTORY:  This is a 81-year lady who has had a laparoscopic  cholecystectomy and has a ventral incisional hernia above her umbilicus; and  in the vicinity of the scar for her supraumbilical port placement at the  time that she had a cholecystectomy. It did reduce, and by CT scan,  contained only fat. After discussion with the patient, she elected to  proceed to repair.   DESCRIPTION OF PROCEDURE:  The patient was seen in the holding area and had  no further questions. We stood her up to visualize the hernia well and  marked it.  The patient was then taken to the operating room; and after  satisfactory general anesthesia had been obtained, the abdomen was prepped  and draped. The time-out occurred.   I used a 0.25% plain Marcaine and injected around it to see if we could help  with postop pain relief. I made a vertical incision, including the old scar,  and identified the hernia sac. It was reduced so, I opened the sac and  excised that. There still some omentum stuck to the fascia; and this was  reduced and freed up so I had could get my finger in and feel the fascia and  no other defects. This defect appeared that it would close transversely more  readily than vertically.   I elected to place a 1.7 inch, round ventral X patch in. This was placed in  and  pulled up tight so that it was snug; and secured with several sutures of  #0 Prolene sutures to make sure that it was snug up against the fascia; and  the fascia was then closed over this with some #0 Prolene.  This produced a  nice closure with no tension. Directly under the patch, appeared to be  omentum, as I was closing.  The subcu was closed with some 3-0 Vicryl, and  skin with 4-0  Monocryl subcuticular. I had injected additional Marcaine in the fascia,  again, to see if we could help with postop pain relief.  Dermabond was  placed on the skin. The patient tolerated the procedure well. There no  complications; and all counts were correct.      Currie Paris, M.D.  Electronically Signed     CJS/MEDQ  D:  08/23/2005  T:  08/24/2005  Job:  [id number]   cc:   Leonidas Romberg  Fax: [phone]   Onalee Hua  Hale Bogus, M.D. [facility]  [address]  [place]  [place] [address]

## 2010-12-28 NOTE — Assessment & Plan Note (Signed)
[facility]                             PULMONARY OFFICE NOTE   Allison Rodgers, Allison Rodgers                       MRN:          [id number]  DATE:11/24/2006                            DOB:          03-Mar-1924    HISTORY OF PRESENT ILLNESS:  The patient is an 77 year old African-  American female patient of Dr. Thurston Hole who has a known history of  diabetes mellitus, hypertension, peripheral edema, and degenerative  arthritis who presents for a six week followup.  At last visit patient's  A1c was up from 6.3 to 6.5, and his fasting blood sugar was 151.  The  patient reports her blood sugars at home have been averaging 120 to 190.  The patient denies any polydipsia, polyuria.  The patient's lower  extremity swelling has been about at baseline recently.  She denies any  chest pain, orthopnea, PND.  Increased leg swelling.   PAST MEDICAL HISTORY:  Reviewed.   CURRENT MEDICATIONS:  Are reviewed in detail.   PHYSICAL EXAMINATION:  GENERAL:  The patient is a pleasant female in no  acute distress.  VITAL SIGNS:  She is afebrile with stable vital signs.  O2 saturation  96% on room air.  HEENT:  Unremarkable.  NECK:  Supple without cervical adenopathy.  No JVD.  LUNGS:  Lung sounds are clear.  CARDIAC:  Regular rate.  ABDOMEN:  Soft and nontender.  EXTREMITIES:  Warm without any cyanosis or clubbing.  There is 1+ edema.   IMPRESSION/PLAN:  1. Chronic lower extremity edema.  Continue on her present diuretic      regimen.  The patient will return back with Dr. [name] in six weeks      or sooner if needed.  2. Diabetes mellitus currently with recent increased A1c.  The patient      will increase insulin up to 20 units daily along with dietary      issues.  The patient does have an appointment with a nutritional      center as well.  The patient will follow back up in six weeks with      followup labs.  3. Complex medication regimen with patient medications reviewed  in      detail.  Patient      education is provided.  The patient's computerized medication      calendar was adjusted accordingly and reviewed in detail.     Allison Oaks, Allison Rodgers  Electronically Signed      [name]. [name], MD, [facility]  Electronically Signed   TP/MedQ  DD: 11/26/2006  DT: 11/26/2006  Job #: [id number]

## 2010-12-28 NOTE — H&P (Signed)
NAME:  Allison, Rodgers                          ACCOUNT NO.:  [id number]   MEDICAL RECORD NO.:  [id number]                   PATIENT TYPE:  INP   LOCATION:  5501                                 FACILITY:  [facility]   PHYSICIAN:  Allison Rodgers, M.D. [facility]               DATE OF BIRTH:  06-27-1924   DATE OF ADMISSION:  10/29/2003  DATE OF DISCHARGE:  10/31/2003                                HISTORY & PHYSICAL   PRIMARY CARE PHYSICIAN:  Dr. Nyoka Rodgers.   CHIEF COMPLAINT:  Painless large-volume hematochezia.   HISTORY OF PRESENT ILLNESS:  Mrs. Allison Rodgers is a very pleasant 77 year old  white female who is in fairly good and stable health.  At about noon on the  day of admission, the patient began passing large volumes of bright red  blood.  This was not accompanied by any stool.  She came to the emergency  room, where she had 2 more stools which were bloody; the final stool was a  little bit more maroon in color and did contain clots.  The patient thinks  that she probably produced over a gallon of blood with these episodes.  She  had some presyncopal symptoms with dizziness, but she did not faint.  This  is the first incidence where she has had a GI bleed.  She does take aspirin  daily and occasionally takes Clinoril for shoulder pain.  She had had a  colonoscopy in February of 2005 which demonstrated diverticulosis.  She has  no history of colon polyps.   ALLERGIES:  Allergies to PENICILLIN and BACTRIM.   CURRENT MEDICATIONS:  1. Aldactazide 50/50 mg; she says that she takes this 2 in the morning, 1 in     the evening, although not quite clear that this is the case, as that     seems like a high dose of hydrochlorothiazide.  2. Furosemide 40 mg as needed.  3. Nasonex spray once every 12 hours.  4. Aspirin 325 mg daily.  5. Metamucil 1 teaspoon daily as needed for constipation.  6. Aciphex 20 mg daily.  7. Clinoril/sulindac 200 mg daily as needed.  8. Eye drops for glaucoma -- 1 drop to  both eyes b.i.d. -- does not know the     name of these.   PAST MEDICAL HISTORY:  1. Hypertension.  2. Asthmatic bronchitis.  3. Gastroesophageal reflux disease.  4. Rotator cuff injury.  5. Status post abdominal hysterectomy.  6. Status post appendectomy.  7. ? Status post laparoscopic cholecystectomy versus laparoscopic removal of     __________ stones.  The patient is unclear and there is no electronic     record to sort this out.   FAMILY HISTORY:  Mother had stroke.  Father died with multiple myeloma.  All  4 of her siblings had appendicitis.  One sibling died, age 71, with  complications from this appendicitis.  One brother  died when he was in his  93 silk; he collapsed, so it is likely coronary disease.  Her remaining  brother has degenerative spine disease and suffers from diarrhea  intermittently; she is not clear whether this is some sort of inflammatory  bowel disease or not; he also had prostate cancer.   REVIEW OF SYSTEMS:  NEUROLOGIC:  Dizzy and presyncope, which improved with  fluids in the emergency room.  No headaches.  MUSCULOSKELETAL:  Has had  rotator cuff issues in the last several months and this is improving with  physical therapy.  CARDIOVASCULAR:  Does get edema in her feet and ankles,  sometimes in her hands.  No chest pain.  She does fatigue easily when she  tries to exert herself; she is not in very good condition, but she denies  any focal weakness.  Mammograms are up to date.  ENDOCRINE:  She has been  told that she is a borderline diabetic.   PHYSICAL EXAM:  VITAL SIGNS:  Pulse is 122, respirations 16, blood pressure  115/68, temperature 97.8 and room air saturations 96%.  GENERAL:  The patient is an obese, pleasant, alert and non-confused older  [place] female.  She looks well.  HEENT:  Sclerae nonicteric.  Conjunctivae are pink.  Extraocular movements  are intact.  Oropharynx is moist and clear.  NECK:  Neck is without JVD, thyromegaly or  adenopathy.  CHEST:  Chest is clear to auscultation and percussion bilaterally.  COR:  There is regular rate and rhythm, somewhat tachycardic but regular.  ABDOMEN:  Abdomen is soft, nontender and nondistended.  It is obese.  There  are no masses or hepatosplenomegaly.  Bowel sounds are active.  RECTAL:  Bright red blood per digital exam, no masses.  EXTREMITIES:  She has mild 1+ edema in the feet.  NEUROLOGIC:  She is alert and oriented x3.  A recall of details of her  medical history is imprecise, however, she is not confused.  PSYCHIATRIC:  She is in good spirits and is appropriate.   LABORATORIES:  Hemoglobin is 13, hematocrit 40.1, MCV is 75.4, white blood  cell count is 11.1, platelets are 273,000.  On repeat, her hemoglobin had  dropped to 10.3 and hematocrit to 32.6.  Chemistries with a glucose of 201.  Sodium 135, potassium 3.8, BUN is 18, creatinine is 1.5.  Liver tests all  within normal limits.  PT 13.5, INR 1.0, PTT 33.   IMPRESSION:  1. Acute lower gastrointestinal bleed, probably from diverticular source,     status post colonoscopy as well as barium enema within the last couple of     months demonstrating diverticulosis, her use of daily aspirin and     occasional sulindac also contributing to her tendency to bleed here.  2. Anemia with microcytosis; the microcytosis consistent with iron     deficiency which suggests a more chronic source of blood loss or dietary     deficiency of iron.  3. History of hypertension, blood pressure a bit low at present secondary to     the bleed.  4. Asthmatic bronchitis, which is not an active problem.   PLAN:  Patient to be admitted for bowel rest, IV fluids and close  observation.  We will stop aspirin and obtain serial blood counts and  transfuse her if her hemoglobin drops below 8.5.      Allison Rodgers, P.A. [facility]  Allison Rodgers, M.D. [facility]   SG/MEDQ  D:  10/30/2003  T:  11/01/2003  Job:  [id number]

## 2010-12-28 NOTE — Discharge Summary (Signed)
NAME:  Allison Rodgers, Allison Rodgers                          ACCOUNT NO.:  [id number]   MEDICAL RECORD NO.:  [id number]                   PATIENT TYPE:  INP   LOCATION:  5501                                 FACILITY:  [facility]   PHYSICIAN:  Judie Petit T. Russella Dar, M.D. [facility]          DATE OF BIRTH:  03/14/1924   DATE OF ADMISSION:  10/29/2003  DATE OF DISCHARGE:  10/31/2003                                 DISCHARGE SUMMARY   ADMISSION DIAGNOSES:  1. Painless lower GI bleed.  Probably a diverticular source.  2. History of diverticulosis demonstrated by colonoscopy as well as barium     enema.  3. Microcytic anemia suggestive of a chronic anemia and possibly chronic GI     blood loss and probably iron deficiency.  4. Hypertension.  Blood pressure was low at presentation probably secondary     to the GI bleeding.  5. History of asthmatic bronchitis, not an active problem.  6. History of gastroesophageal reflux disease.  7. Status post rotator cuff injury.  8. Status post abdominal hysterectomy.  9. Status post appendectomy.  10.      Status post either laparoscopic cholecystectomy versus laparoscopic     removal of kidney stone.  Electronic records not helpful in sorting this     question out.  11.      Allergies to PENICILLIN and BACTRIM.  12.      Status post cataract surgery.   DISCHARGE DIAGNOSES:  1. Lower GI bleed, presumed diverticular in origin, resolved.  2. Microcytic anemia, however, iron studies and ferritin are well within     normal limits.  3. Diabetes mellitus type 2, new diagnosis.  4. History of hypertension.  However, blood pressure low consistently during     this hospitalization and reassessment of antihypertensive regimen.   PLAN:  Reevaluation of antihypertensive medications with Dr. [name] on November 01, 2003.   BRIEF HISTORY:  Allison Rodgers is a pleasant 77 year old African-American female  who was admitted by Dr. Lina Sar when she presented to the [facility] Emergency  Room midday on  the day of admission having experienced a total of three  episodes of passing large amounts of bright red and later maroon stools.  She surmised that she probably produced about a gallon of bloody material  with these episodes.  She was dizzy but did not faint.  She had never had  prior GI bleeding.  She does use aspirin daily and occasionally uses  Clinoril for shoulder pain.  February of 2005 colonoscopy had demonstrated  diverticulosis and no colon polyps then or in the past.  She was admitted by  Dr. Lina Sar for a presumed diverticular bleed in order to provide  supportive care.  She was stable on admission.   LABORATORY DATA:  Initial hemoglobin 13.  It reached a low of 9.7 and was  10.5 at discharge.  Hematocrit was 33.3 at discharge.  PC 13.5,  INR 1.0 and  PTT of 33.  Sodium 135, potassium 3.8.  BUN 18, creatinine 1.5.  Glucose was  201 and rechecked at 179.  Total bilirubin 0.6, alkaline phosphatase 108,  AST 22 and ALT 17.  Iron level 99, total iron binding capacity 284, iron  saturation 35%.  Ferritin level 181.  Urinalysis was unremarkable.   HOSPITAL COURSE:  The patient was admitted in the early evening by Dr.  Juanda Chance.  She was started on IV fluids and clear liquids.  By the following  day she had not had any further bloody material per rectum.  Thus she had  only the three episodes of hematochezia with which she presented.  Her diet  was advanced to a diabetic diet because her blood sugar was elevated.  The  following day she had not had any bowel movements or blood per rectum.  She  was doing well, her blood counts did drop somewhat but she did not require  any transfusions.   The patient's sugars were significantly elevated both on serum checks as  well as CBGs.  The CBGs ranged 120-173.  We had planned to have her followup  with Dr. [name] the day after her discharge where he could address this and  perhaps start her on oral diabetic agents.  The other issue general  medical  issue was the fact that her blood pressure ran fairly low and with systolics  106-119 and diastolics in the 40s and 50s.  This was while her blood  pressure medications were held and in the setting of no further GI bleeding  as well as IV fluid hydration.   Plan as far as the hypertension went was to have her followup with Dr. [name]  in the office again the day after discharge for assessment of the  antihypertensive regimen.  In the meantime, she would be kept on Aldactazide  as before.   CONDITION AT DISCHARGE:  Stable and improved.   MEDICATIONS AT DISCHARGE:  1. Aldactazide 50/50 two in the morning.  2. Furosemide 40 mg p.r.n.  She was advised not to use this for the time     being until Dr. [name] said it would be okay to resume this.  3. Systane eye drops twice daily.  4. Citrucel 1 tsp in water or juice once daily.  5. Milk of Magnesia as needed.  6. Nasonex nasal spray daily.  7. Aciphex 20 mg daily.  The patient was advised not to resume aspirin or     Sulindac for two weeks but okay for Tylenol if needed in the meantime.      Jennye Moccasin, P.A. [facility]                   Malcolm T. Russella Dar, M.D. [facility]    SG/MEDQ  D:  11/23/2003  T:  11/24/2003  Job:  [id number]   cc:   Lina Sar, M.D. [facility]   Casimiro Needle B. [name], M.D. [facility]

## 2010-12-28 NOTE — Assessment & Plan Note (Signed)
[facility]                               PULMONARY OFFICE NOTE   CHELSA, STOUT                       MRN:          [id number]  DATE:06/30/2006                            DOB:          1924/06/21    PRIMARY SERVICE/COMPREHENSIVE CARE EVALUATION   HISTORY:  This is an 77 year old black female, never smoker, with morbid  obesity complicated by hypertension and degenerative arthritis as well as  active GERD with a hiatal hernia documented by previous endoscopy in 1999.  She has had borderline diabetes in the past but over the last 3 months has  developed overt diabetes with blood sugars as high as 485, documented  May 01, 2006, with complaints of polyuria then. Her hemoglobin A1C was  11 then and decreased to 7.3 with treatment which included both Lantus 20  units daily and Glucovance 2.5/500 b.i.d. Over the last week, she has  developed symptoms of feeling jittery and nervous in the morning on this  regimen and her fasting blood sugar was 78 this morning when she felt that  way and therefore ate breakfast before coming to the office as requested for  a fasting blood work.   She denies any ocular complaints or significant leg swelling over baseline,  numbness, chest pain, fevers, chills, sweats, TIA or claudication symptoms.  Denies any orthopnea, excessive hypersomnolence, polyuria or polydipsia at  this point, which have now resolved.   PAST MEDICAL HISTORY:  1. Rectal bleeding with upper and lower endoscopy in January 2005, showing      gastritis/diverticulosis for which she is supposed to be taking      Citrucel daily but fails to do so.  2. Borderline iron-deficiency anemia secondary to #1. Iron therapy stopped      September 2006.  3. Chronic functional constipation for which she is supposed to be taking      Citrucel but does not.  4. Degenerative arthritis, status post right rotator cuff repair on the      left July 2005,  followed by Dr. Teressa Senter.  5. Status post total abdominal hysterectomy, bilateral salpingo-      oophorectomy.  6. Chronic microcytic indices consistent with thalassemia minor trait.   ALLERGIES:  PENICILLIN AND BACTRIM CAUSE A RASH.   MEDICATIONS:  Taken in detail on the worksheet and correlate with column  dated July 03, 2006, although note she is making mistakes with  administration of Citrucel. Rarely needs any Vicodin.   For arthritis pain, see below.   SOCIAL HISTORY:  She has never smoked. She has a history of social drinking  only. She is a retired [employer] operational officer.   FAMILY HISTORY:  Is positive for asthma in her brother who also had  hypertension. Mother died of stroke. Father had multiple myeloma. Family  history is negative for colon cancer, breast cancer, premature heart disease  to her knowledge.   REVIEW OF SYSTEMS:  Taken in detail and significant for increasing aches and  pains in shoulders more than hips, more than knees, more than ankles. This  is not symmetric and does not  consistently respond to sulindac.   PHYSICAL EXAMINATION:  This is an obese, somewhat passive black female who  is somber but not overtly depressed. She has stable vital signs with no  change in her height at 5 feet even.  HEENT: Ocular examination was done but limited. Funduscopic, really no  definite arterial change. Ear canals are clear bilaterally.  NECK: Supple without cervical adenopathy or tenderness. Trachea is midline.  No thyromegaly. Carotid upstrokes are brisk without bruits.  LUNGS: Lung fields perfectly clear bilaterally to auscultation and  percussion.  BREASTS: Are without masses, nipple or skin changes.  HEART: Regular rate and rhythm without murmur, gallop or rub.  ABDOMEN: Obese, soft and benign with no palpable organomegaly, masses or  tenderness.  EXTREMITIES: Warm without calf tenderness, cyanosis, clubbing or edema.  Pedal pulses are brisk and symmetric bilaterally.   NEUROLOGIC: No focal deficits, pathologic reflexes. Specifically she had  normal DTRs distally and proximally.  MUSCULOSKELETAL: She had limited internal rotation of both hips and mild  crepitance of the left greater than right shoulder, but full range of  motion.  SKIN: Was warm and dry.   IMPRESSION:  1. Overt diabetes now with borderline fasting hypoglycemia. I am going to      reduce her Lantus down to 15 units q. a.m. and ask her to return in two      weeks for repeat lab profile and at that point take neither her Lantus      or her Glucovance so that we can get a true fasting blood work which      will include lipid profile.  2. Morbid obesity continues to be her primary medical problem for which we      need to make sure she is seeing a nutritionist at least every 3 months      with a food diary.  3. Tendency to peripheral edema. Has been controlled on her present      regimen, which I did not change. We will need to check her renal      function on her next visit as well as potassium looking for any      evidence of adverse drug effect.  4. Chronic microcytic indices with no evidence of anemia consistent with      thalassemia minor trait.  5. General health maintenance. She receives her mammograms yearly through      Dr. Shirlee More office and was updated on all of her shots today.  6. Degenerative arthritis, status post right rotator cuff repair July      2005. Continuing to have shoulder pain. Referred therefore back to Dr.      Stark Jock office. She has pain refractory      sulindac and I have advised her, since she does not think it is      working, to stop it and see      Dr. Teressa Senter for further evaluation.  7. Chronic L4-5 radiculopathy on the left, followed by Dr. Altamease Oiler.     [name]. [name], MD, [facility]  Electronically Signed    MBW/MedQ  DD: 06/30/2006  DT: 06/30/2006  Job #: [id number]

## 2011-03-07 ENCOUNTER — Encounter: Payer: Self-pay | Admitting: Podiatry

## 2011-04-24 ENCOUNTER — Other Ambulatory Visit (INDEPENDENT_AMBULATORY_CARE_PROVIDER_SITE_OTHER): Payer: Medicare Other

## 2011-04-24 ENCOUNTER — Encounter: Payer: Self-pay | Admitting: Internal Medicine

## 2011-04-24 ENCOUNTER — Ambulatory Visit (INDEPENDENT_AMBULATORY_CARE_PROVIDER_SITE_OTHER): Payer: Medicare Other | Admitting: Internal Medicine

## 2011-04-24 VITALS — BP 160/58 | HR 75 | Temp 98.3°F | Ht 61.0 in | Wt 196.2 lb

## 2011-04-24 DIAGNOSIS — I10 Essential (primary) hypertension: Secondary | ICD-10-CM

## 2011-04-24 DIAGNOSIS — R609 Edema, unspecified: Secondary | ICD-10-CM

## 2011-04-24 DIAGNOSIS — E119 Type 2 diabetes mellitus without complications: Secondary | ICD-10-CM

## 2011-04-24 LAB — BASIC METABOLIC PANEL
BUN: 28 mg/dL — ABNORMAL HIGH (ref 6–23)
Creatinine, Ser: 1.4 mg/dL — ABNORMAL HIGH (ref 0.4–1.2)
GFR: 47.86 mL/min — ABNORMAL LOW (ref >60.00)

## 2011-04-24 NOTE — Patient Instructions (Addendum)
If swelling is bad ok to take extra furosemide  See Tammy NP w/in 1-2 weeks with all your medications, even over the counter meds, separated in two separate bags, the ones you take no matter what vs the ones you stop once you feel better and take only as needed when you feel you need them.   Tammy  will generate for you a new user friendly medication calendar that will put [place] all on the same page re: your medication use.     Without this process, it simply isn't possible to assure that we are providing  your outpatient care  with  the attention to detail we feel you deserve.   If we cannot assure that you're getting that kind of care,  then we cannot manage your problem effectively from this clinic.  Once you have seen Tammy and we are sure that we're all on the same page with your medication use she will arrange follow up with me.   NEEDS MEMORY STUDY ALSO

## 2011-04-24 NOTE — Assessment & Plan Note (Signed)
Needs to follow previous action plan re use of prn furosemide

## 2011-04-24 NOTE — Assessment & Plan Note (Signed)
Marginally Adequate control on present rx, reviewed need to do med rec before change rx.

## 2011-04-24 NOTE — Assessment & Plan Note (Signed)
Before considering tighter control need to regroup with accurate and meaningful medication reconciliation   To keep things simple, I have asked the patient to first separate medicines that are perceived as maintenance, that is to be taken daily "no matter what", from those medicines that are taken on only on an as-needed basis and I have given the patient examples of both, and then return to see our NP to generate a  detailed  medication calendar which should be followed until the next physician sees the patient and updates it.

## 2011-04-24 NOTE — Progress Notes (Signed)
Subjective:     Patient ID: Allison Rodgers, female   DOB: 07-31-1924, 77 y.o.   MRN: [id number]  HPI  25 yobf with morbid obesity complicated by DM, HTN and Dyslipidemia and chronic venous insufficiency with dependent edema.    August 28, 2010 cpx getting all better. limited by legs/hip/ knees no sob or increase leg swelling. >>labs done w/no rx changes   September 11, 2010 --Pt here for follow and med calendar. Last ov with CPX. Labs were w/no significant changes. A1C at 6.7 , s.CR holding at 1.4 . We reviewed all her labs in detail.Urine did show microscopic hematuria . Discussed a DM and low cholestrol diet. She is recovering from a prolonged bronchitis-feeling better but not back to baseline. She has clear drainge and energy level is low. We reviewed all her meds and organized her med calendar.  rec follow calendar  04/24/2011 f/u ov/Allison Rodgers lost calendar, confused with med instructions, c/o increased leg swelling, not using prn lasix as per prev action plan.   Sleeping ok without nocturnal  or early am exacerbation  of respiratory  c/o's. Also denies any obvious fluctuation of symptoms with weather or environmental changes or other aggravating or alleviating factors except as outlined above.  ROS  At present neg for  any significant sore throat, dysphagia, itching, sneezing,  nasal congestion or excess/ purulent secretions,  fever, chills, sweats, unintended wt loss, pleuritic or exertional cp, hempoptysis, orthopnea pnd,  No  presyncope, palpitations, heartburn, abdominal pain, nausea, vomiting, diarrhea  or change in bowel or urinary habits, dysuria,hematuria,  rash, arthralgias, visual complaints, headache, numbness weakness or ataxia.        Allergies    1) Pcn  2) Bactrim      Family History:   asthma in her brother who also has hypertension  mother died of stroke, also had diabetes and arthritis  father had multiple myeloma  4 siblings all in relatively good health    Social  History:  Patient never smoked.  positive for second-hand smoke exposure  exercises 2-3 times a week  1 cup caffeine a day  married  1 child  Risk Factors:  Smoking Status: never     Past Medical History:  GERD (ICD-530.81)  ABDOMINAL PAIN, CHRONIC (ICD-789.00)  FATIGUE, CHRONIC (ICD-780.79)  DEGENERATIVE JOINT DISEASE (ICD-715.90)  - L5 radiculopathy confirmed by mri 08/2005  --refer to ortho August 24, 2008 >>> Dr Allison Rodgers  HYPERTENSIVE CARDIOVASCULAR DISEASE (ICD-402.90)  OBESITY (ICD-278.00)  - Target wt = 158 for BMI < 30  - Referred [facility] 04/2005  GASTRITIS, CHRONIC (ICD-535.10)  BENIGN NEOPLASM OTH&UNSPEC SITE DIGESTIVE SYSTEM (ICD-211.9)  ESOPHAGEAL STRICTURE (ICD-530.3)..............................................Marland KitchenPatterson  - EGD 12/14/07  HIATAL HERNIA (ICD-553.3)  COLONIC POLYPS, ADENOMATOUS (ICD-211.3)  - Colonoscopy 12/14/07  DIVERTICULOSIS, COLON (ICD-562.10)  DYSLIPIDEMIA (ICD-272.4)  ARTHRITIS (ICD-716.90)  DM (ICD-250.00)  HYPERTENSION (ICD-401.9)  HEALTH MAINTENANCE  - dt 03/2003  - Pneumovax 03/2003  - CPX August 28, 2010  -Mammogram 10/2009, ? abn asymmetry >>repeat in 6 months  MICROCYTIC ANEMA  - DX 02/03/09, Stool g neg x 6, rx Fe 65 mg /day > improving March 17, 2009  Edema--08/10/08--venous doppler neg for dvt, ?ruptured bakers cyst-right  March 20, 2009 repeat venous doppler > neg bilaterally  May 21, 2010 repeat venous dopplers >> neg bilat  Complex med regimen>>Meds reviewed with pt education and computerized med calendar September 11, 2010       Review of Systems     Objective:   Physical  Exam obese ambulatory bf nad.  wt 213 10/21/08 > 204 April 26, 2009 > 196 February 19, 2010  > 199 August 28, 2010 >> 203 September 11, 2010 > 196 04/24/2011  HEENT: nl dentition, turbinates, and orophanx. Nl external ear canals without cough reflex  Neck without JVD/Nodes/TM  Lungs clear to A and P bilaterally without cough on insp or exp  maneuvers  RRR no s3 or murmur or increase in P2, trace edema only sym bilaterally  Abd soft and benign with nl excursion in the supine position. No bruits or organomegaly  EXT : chronic edema, venous insufficiency changes w/ stasis dermatic changes R > L    Assessment:        Plan:

## 2011-04-24 NOTE — Progress Notes (Signed)
Quick Note:  Spoke with pt and notified of results per Dr. Wert. Pt verbalized understanding and denied any questions.  ______ 

## 2011-05-08 ENCOUNTER — Encounter: Payer: Medicare Other | Admitting: Adult Health

## 2011-05-10 ENCOUNTER — Encounter: Payer: Self-pay | Admitting: Adult Health

## 2011-05-10 ENCOUNTER — Ambulatory Visit (INDEPENDENT_AMBULATORY_CARE_PROVIDER_SITE_OTHER): Payer: Medicare Other | Admitting: Adult Health

## 2011-05-10 ENCOUNTER — Other Ambulatory Visit (INDEPENDENT_AMBULATORY_CARE_PROVIDER_SITE_OTHER): Payer: Medicare Other

## 2011-05-10 VITALS — BP 142/72 | HR 93 | Temp 96.9°F | Ht 60.0 in | Wt 188.4 lb

## 2011-05-10 DIAGNOSIS — E119 Type 2 diabetes mellitus without complications: Secondary | ICD-10-CM

## 2011-05-10 DIAGNOSIS — Z23 Encounter for immunization: Secondary | ICD-10-CM

## 2011-05-10 DIAGNOSIS — K644 Residual hemorrhoidal skin tags: Secondary | ICD-10-CM | POA: Insufficient documentation

## 2011-05-10 LAB — HEMOGLOBIN A1C: Hgb A1c MFr Bld: 8.6 % — ABNORMAL HIGH (ref 4.6–6.5)

## 2011-05-10 NOTE — Progress Notes (Signed)
Addended by: Tommie Sams on: 05/10/2011 11:47 AM   Modules accepted: Orders

## 2011-05-10 NOTE — Assessment & Plan Note (Addendum)
Diet and weight loss discussed  Labs today with A1C  Will adjust meds as labs return-- unable to take higher doses of amaryl due to low sugars in past.  Consider lantus adjustments as needed.  Patient's medications were reviewed today and patient education was given. Computerized medication calendar was adjusted/completed

## 2011-05-10 NOTE — Patient Instructions (Signed)
Follow med calendar closely and bring to each visit.  I will call with labs  Use Preparation H Hemorrhoid cream after bowel movement  Low sweet diet.  follow up Dr. [name]  3-4 months and As needed   Flu shot today  Please contact office for sooner follow up if symptoms do not improve or worsen or seek emergency care

## 2011-05-10 NOTE — Assessment & Plan Note (Signed)
Cont on citrucel  Avoid straining OTC hemorrhoid creams As needed   If not improving or resolve will need GI referral.

## 2011-05-10 NOTE — Progress Notes (Signed)
Addended by: Tommie Sams on: 05/10/2011 02:35 PM   Modules accepted: Orders

## 2011-05-10 NOTE — Progress Notes (Signed)
Subjective:     Patient ID: Allison Rodgers, female   DOB: 06-Apr-1924, 77 y.o.   MRN: [id number]  HPI  79 yobf with morbid obesity complicated by DM, HTN and Dyslipidemia and chronic venous insufficiency with dependent edema.    August 28, 2010 cpx getting all better. limited by legs/hip/ knees no sob or increase leg swelling. >>labs done w/no rx changes   September 11, 2010 --Pt here for follow and med calendar. Last ov with CPX. Labs were w/no significant changes. A1C at 6.7 , s.CR holding at 1.4 . We reviewed all her labs in detail.Urine did show microscopic hematuria . Discussed a DM and low cholestrol diet. She is recovering from a prolonged bronchitis-feeling better but not back to baseline. She has clear drainge and energy level is low. We reviewed all her meds and organized her med calendar.  rec follow calendar  04/24/2011 f/u ov/Wert lost calendar, confused with med instructions, c/o increased leg swelling, not using prn lasix as per prev action plan.  Sleeping ok without nocturnal  or early am exacerbation  of respiratory  c/o's.  >labs showed BS at 300 nonfasting.   05/10/2011. Follow up and med review.  Seen 2 weeks ago for follow up . Labs showed BS elevated at 300 nonfasting.  Disucssed diet and exercise. Weight is going down with dietary changes.  Will check A1C today to evaluate average control.   We reviewed all her meds today and updated her med calendar w/ pt education  Appears she is taking her meds correctly.   Complains that she has seen small specks of red blood on tissues with wiping after bowel movement   Last colonoscopy was 2009.  No abdominal pain or n/v/d.          Allergies    1) Pcn  2) Bactrim      Family History:   asthma in her brother who also has hypertension  mother died of stroke, also had diabetes and arthritis  father had multiple myeloma  4 siblings all in relatively good health    Social History:  Patient never smoked.  positive for  second-hand smoke exposure  exercises 2-3 times a week  1 cup caffeine a day  married  1 child  Risk Factors:  Smoking Status: never     Past Medical History:  GERD (ICD-530.81)  ABDOMINAL PAIN, CHRONIC (ICD-789.00)  FATIGUE, CHRONIC (ICD-780.79)  DEGENERATIVE JOINT DISEASE (ICD-715.90)  - L5 radiculopathy confirmed by mri 08/2005  --refer to ortho August 24, 2008 >>> Dr Darrelyn Hillock  HYPERTENSIVE CARDIOVASCULAR DISEASE (ICD-402.90)  OBESITY (ICD-278.00)  - Target wt = 158 for BMI < 30  - Referred [facility] 04/2005  GASTRITIS, CHRONIC (ICD-535.10)  BENIGN NEOPLASM OTH&UNSPEC SITE DIGESTIVE SYSTEM (ICD-211.9)  ESOPHAGEAL STRICTURE (ICD-530.3)..............................................Marland KitchenPatterson  - EGD 12/14/07  HIATAL HERNIA (ICD-553.3)  COLONIC POLYPS, ADENOMATOUS (ICD-211.3)  - Colonoscopy 12/14/07  DIVERTICULOSIS, COLON (ICD-562.10)  DYSLIPIDEMIA (ICD-272.4)  ARTHRITIS (ICD-716.90)  DM (ICD-250.00)  HYPERTENSION (ICD-401.9)  HEALTH MAINTENANCE  - dt 03/2003  - Pneumovax 03/2003  - CPX August 28, 2010  -Mammogram 10/2009, ? abn asymmetry >>repeat in 6 months  MICROCYTIC ANEMA  - DX 02/03/09, Stool g neg x 6, rx Fe 65 mg /day > improving March 17, 2009  Edema--08/10/08--venous doppler neg for dvt, ?ruptured bakers cyst-right  March 20, 2009 repeat venous doppler > neg bilaterally  May 21, 2010 repeat venous dopplers >> neg bilat  Complex med regimen>>Meds reviewed with pt education and computerized med calendar  September 11, 2010 , 05/10/2011       Review of Systems Constitutional:   No  weight loss, night sweats,  Fevers, chills, fatigue, or  lassitude.  HEENT:   No headaches,  Difficulty swallowing,  Tooth/dental problems, or  Sore throat,                No sneezing, itching, ear ache, nasal congestion, post nasal drip,   CV:  No chest pain,  Orthopnea, PND, swelling in lower extremities, anasarca, dizziness, palpitations, syncope.   GI  No heartburn,  indigestion, abdominal pain, nausea, vomiting, diarrhea, change in bowel habits, loss of appetite   Resp: No shortness of breath with exertion or at rest.  No excess mucus, no productive cough,  No non-productive cough,  No coughing up of blood.  No change in color of mucus.  No wheezing.  No chest wall deformity  Skin: no rash or lesions.  GU: no dysuria, change in color of urine, no urgency or frequency.  No flank pain, no hematuria   MS:  No joint  swelling.    Psych:  No change in mood or affect. No depression or anxiety.  No memory loss.          Objective:   Physical Exam obese ambulatory bf nad.  wt 213 10/21/08 > 204 April 26, 2009 > 196 February 19, 2010  > 199 August 28, 2010 >> 203 September 11, 2010 > 196 04/24/2011 >>188 05/10/2011  HEENT: nl dentition, turbinates, and orophanx. Nl external ear canals without cough reflex  Neck without JVD/Nodes/TM  Lungs clear to A and P bilaterally without cough on insp or exp maneuvers  RRR no s3 or murmur or increase in P2, trace edema only sym bilaterally  Abd soft and benign with nl excursion in the supine position. No bruits or organomegaly  Rectal exam: nml sphincter tone, neg occult stool , 2 small external hemorrhoids noted  EXT : chronic edema, venous insufficiency changes w/ stasis dermatic changes R > L    Assessment:        Plan:

## 2011-05-14 ENCOUNTER — Other Ambulatory Visit: Payer: Self-pay | Admitting: *Deleted

## 2011-05-14 MED ORDER — ALPRAZOLAM 0.25 MG PO TABS: 0.2500 mg | ORAL_TABLET | Freq: Four times a day (QID) | ORAL | Status: DC | PRN

## 2011-05-14 MED ORDER — ESOMEPRAZOLE MAGNESIUM 40 MG PO CPDR: 40.0000 mg | DELAYED_RELEASE_CAPSULE | Freq: Every day | ORAL | Status: DC

## 2011-05-14 MED ORDER — SPIRONOLACTONE 50 MG PO TABS: 100.0000 mg | ORAL_TABLET | Freq: Every day | ORAL | Status: DC

## 2011-07-26 ENCOUNTER — Ambulatory Visit (INDEPENDENT_AMBULATORY_CARE_PROVIDER_SITE_OTHER): Payer: Medicare Other | Admitting: Internal Medicine

## 2011-07-26 ENCOUNTER — Other Ambulatory Visit (INDEPENDENT_AMBULATORY_CARE_PROVIDER_SITE_OTHER): Payer: Medicare Other

## 2011-07-26 ENCOUNTER — Encounter: Payer: Self-pay | Admitting: Internal Medicine

## 2011-07-26 DIAGNOSIS — R609 Edema, unspecified: Secondary | ICD-10-CM

## 2011-07-26 DIAGNOSIS — N189 Chronic kidney disease, unspecified: Secondary | ICD-10-CM

## 2011-07-26 DIAGNOSIS — I10 Essential (primary) hypertension: Secondary | ICD-10-CM

## 2011-07-26 LAB — BASIC METABOLIC PANEL
BUN: 14 mg/dL (ref 6–23)
Chloride: 108 mEq/L (ref 96–112)
Potassium: 3.7 mEq/L (ref 3.5–5.1)

## 2011-07-26 LAB — HEPATIC FUNCTION PANEL
AST: 18 U/L (ref 0–37)
Alkaline Phosphatase: 91 U/L (ref 39–117)
Total Bilirubin: 0.6 mg/dL (ref 0.3–1.2)

## 2011-07-26 LAB — TSH: TSH: 1.5 u[IU]/mL (ref 0.35–5.50)

## 2011-07-26 NOTE — Patient Instructions (Signed)
Please remember to go to the lab department downstairs for your tests - we will call you with the results when they are available.  Change the as needed furosemide to 40 mg 2 extra daily if needed (can use up to 2 extra daily or a total of 4)  Watch the salt and salty food  Please schedule a follow up office visit in 4 weeks, sooner if needed either to see Tammy or [employer]

## 2011-07-26 NOTE — Progress Notes (Signed)
Subjective:     Patient ID: Allison Rodgers, female   DOB: 05/20/1924, 77 y.o.   MRN: [id number]  HPI  89 yobf with morbid obesity complicated by DM, HTN and Dyslipidemia and chronic venous insufficiency with dependent edema.    August 28, 2010 cpx getting all better. limited by legs/hip/ knees no sob or increase leg swelling. >>labs done w/no rx changes   September 11, 2010 --Pt here for follow and med calendar. Last ov with CPX. Labs were w/no significant changes. A1C at 6.7 , s.CR holding at 1.4 . We reviewed all her labs in detail.Urine did show microscopic hematuria . Discussed a DM and low cholestrol diet. She is recovering from a prolonged bronchitis-feeling better but not back to baseline. She has clear drainge and energy level is low. We reviewed all her meds and organized her med calendar.  rec follow calendar  04/24/2011 f/u ov/Wert lost calendar, confused with med instructions, c/o increased leg swelling, not using prn lasix as per prev action plan.  Sleeping ok without nocturnal  or early am exacerbation  of respiratory  c/o's.  >labs showed BS at 300 nonfasting.   05/10/2011. Follow up and med review.  Seen 2 weeks ago for follow up . Labs showed BS elevated at 300 nonfasting.  Disucssed diet and exercise. Weight is going down with dietary changes.  Will check A1C today to evaluate average control.   We reviewed all her meds today and updated her med calendar w/ pt education  Appears she is taking her meds correctly.   Complains that she has seen small specks of red blood on tissues with wiping after bowel movement   Last colonoscopy was 2009.  No abdominal pain or n/v/d.  rec Follow med calendar closely and bring to each visit.  Use Preparation H Hemorrhoid cream after bowel movement  Low sweet diet.   Flu shot today   07/26/2011 Wert/ ov cc Pt c/o increased edema "from the hips down" x 2 months - taking an extra furosemide as per calendar "maybe once a week" with no benefit  (calendar clearly says take one extra daily if needed for swelling).  Denies orthopnea. Walked up steep hill to office on day of ov s doe. Denies salt excess and takes her maintenance doses of furosemide and spironlactone as per calendar instructions  Sleeping ok without nocturnal  or early am exacerbation  of respiratory c/os. Also denies any obvious fluctuation of symptoms with weather or environmental changes or other aggravating or alleviating factors except as outlined above   ROS  At present neg for  any significant sore throat, dysphagia, itching, sneezing,  nasal congestion or excess/ purulent secretions,  fever, chills, sweats, unintended wt loss, pleuritic or exertional cp, hempoptysis, orthopnea pnd or leg swelling.  Also denies presyncope, palpitations, heartburn, abdominal pain, nausea, vomiting, diarrhea  or change in bowel or urinary habits, dysuria,hematuria,  rash, arthralgias, visual complaints, headache, numbness weakness or ataxia.                Allergies    1) Pcn  2) Bactrim      Family History:   asthma in her brother who also has hypertension  mother died of stroke, also had diabetes and arthritis  father had multiple myeloma  4 siblings all in relatively good health    Social History:  Patient never smoked.  positive for second-hand smoke exposure  exercises 2-3 times a week  1 cup caffeine a day  married  1 child  Risk Factors:  Smoking Status: never     Past Medical History:  GERD (ICD-530.81)  ABDOMINAL PAIN, CHRONIC (ICD-789.00)  FATIGUE, CHRONIC (ICD-780.79)  DEGENERATIVE JOINT DISEASE (ICD-715.90)  - L5 radiculopathy confirmed by mri 08/2005  --refer to ortho August 24, 2008 >>> Dr Darrelyn Hillock  HYPERTENSIVE CARDIOVASCULAR DISEASE (ICD-402.90)  OBESITY (ICD-278.00)  - Target wt = 158 for BMI < 30  - Referred [facility] 04/2005  GASTRITIS, CHRONIC (ICD-535.10)  BENIGN NEOPLASM OTH&UNSPEC SITE DIGESTIVE SYSTEM (ICD-211.9)  ESOPHAGEAL  STRICTURE (ICD-530.3)..............................................Marland KitchenPatterson  - EGD 12/14/07  HIATAL HERNIA (ICD-553.3)  COLONIC POLYPS, ADENOMATOUS (ICD-211.3)  - Colonoscopy 12/14/07  DIVERTICULOSIS, COLON (ICD-562.10)  DYSLIPIDEMIA (ICD-272.4)  ARTHRITIS (ICD-716.90)  DM (ICD-250.00)  HYPERTENSION (ICD-401.9)  HEALTH MAINTENANCE  - dt 03/2003  - Pneumovax 03/2003  - CPX August 28, 2010  -Mammogram 10/2009, ? abn asymmetry >>repeat in 6 months  MICROCYTIC ANEMA  - DX 02/03/09, Stool g neg x 6, rx Fe 65 mg /day > improving March 17, 2009  Edema--08/10/08--venous doppler neg for dvt, ?ruptured bakers cyst-right  March 20, 2009 repeat venous doppler > neg bilaterally  May 21, 2010 repeat venous dopplers >> neg bilat  Complex med regimen>>Meds reviewed with pt education and computerized med calendar September 11, 2010 , 05/10/2011                 Objective:   Physical Exam obese ambulatory bf nad.  wt 213 10/21/08 > 204 April 26, 2009 > 196 February 19, 2010  > 199 August 28, 2010 >> 203 September 11, 2010 > 196 04/24/2011 >>188 05/10/2011  > 07/26/2011  200 HEENT: nl dentition, turbinates, and orophanx. Nl external ear canals without cough reflex  Neck without JVD/Nodes/TM  Lungs clear to A and P bilaterally without cough on insp or exp maneuvers  RRR no s3 or murmur or increase in P2, trace edema only sym bilaterally  Abd soft and benign with nl excursion in the supine position. No bruits or organomegaly  Rectal exam: nml sphincter tone, neg occult stool , 2 small external hemorrhoids noted  EXT : severe pitting edema, venous insufficiency changes w/ stasis dermatitis changes R > L    Assessment:        Plan:

## 2011-07-27 ENCOUNTER — Encounter: Payer: Self-pay | Admitting: Internal Medicine

## 2011-07-27 NOTE — Assessment & Plan Note (Signed)
Diet reviwed, declines referral back to nutrition at this point

## 2011-07-27 NOTE — Assessment & Plan Note (Signed)
Not Adequate control on present rx, reviewed options> modified calendar to read can take up to 2 extra furosemide daily if needed to control swelling  I had an extended discussion with the patient today lasting 15 to 20 minutes of a 25 minute visit on the following issues:   Each maintenance medication was reviewed in detail including most importantly the difference between maintenance and as needed and under what circumstances the prns are to be used.  This was done in the context of a medication calendar review which provided the patient with a user-friendly unambiguous mechanism for medication administration and reconciliation and provides an action plan for all active problems. It is critical that this be shown to every doctor  for modification during the office visit if necessary so the patient can use it as a working document.

## 2011-07-27 NOTE — Assessment & Plan Note (Signed)
Adequate control on present rx, reviewed  

## 2011-07-27 NOTE — Assessment & Plan Note (Signed)
Lab Results  Component Value Date   CREATININE 1.4* 04/24/2011   CREATININE 1.55* 12/16/2010   CREATININE 1.22* 12/15/2010    Needs close monitoring on higher doses of diuretics

## 2011-07-29 LAB — URINALYSIS
Bilirubin Urine: NEGATIVE
Ketones, ur: NEGATIVE
Total Protein, Urine: NEGATIVE
pH: 5.5 (ref 5.0–8.0)

## 2011-07-30 NOTE — Progress Notes (Signed)
Quick Note:  Spoke with pt and notified of results per Dr. Wert. Pt verbalized understanding and denied any questions.  ______ 

## 2011-08-22 ENCOUNTER — Encounter: Payer: Self-pay | Admitting: Internal Medicine

## 2011-08-23 ENCOUNTER — Other Ambulatory Visit (INDEPENDENT_AMBULATORY_CARE_PROVIDER_SITE_OTHER): Payer: Medicare Other

## 2011-08-23 ENCOUNTER — Encounter: Payer: Self-pay | Admitting: Internal Medicine

## 2011-08-23 ENCOUNTER — Ambulatory Visit (INDEPENDENT_AMBULATORY_CARE_PROVIDER_SITE_OTHER): Payer: Medicare Other | Admitting: Internal Medicine

## 2011-08-23 DIAGNOSIS — R609 Edema, unspecified: Secondary | ICD-10-CM

## 2011-08-23 DIAGNOSIS — E119 Type 2 diabetes mellitus without complications: Secondary | ICD-10-CM

## 2011-08-23 DIAGNOSIS — R51 Headache: Secondary | ICD-10-CM

## 2011-08-23 DIAGNOSIS — N189 Chronic kidney disease, unspecified: Secondary | ICD-10-CM

## 2011-08-23 DIAGNOSIS — I10 Essential (primary) hypertension: Secondary | ICD-10-CM

## 2011-08-23 LAB — HEPATIC FUNCTION PANEL
AST: 17 U/L (ref 0–37)
Albumin: 3.9 g/dL (ref 3.5–5.2)
Alkaline Phosphatase: 82 U/L (ref 39–117)
Total Protein: 7.6 g/dL (ref 6.0–8.3)

## 2011-08-23 LAB — BASIC METABOLIC PANEL
Calcium: 9.5 mg/dL (ref 8.4–10.5)
Creatinine, Ser: 1.3 mg/dL — ABNORMAL HIGH (ref 0.4–1.2)

## 2011-08-23 LAB — HEMOGLOBIN A1C: Hgb A1c MFr Bld: 7.1 % — ABNORMAL HIGH (ref 4.6–6.5)

## 2011-08-23 NOTE — Progress Notes (Signed)
Subjective:     Patient ID: Allison Rodgers, female   DOB: [birth year], 78 y.o.   MRN: [id number]  HPI  65 yobf with morbid obesity complicated by DM, HTN and Dyslipidemia and chronic venous insufficiency with dependent edema.    August 28, 2010 cpx getting all better. limited by legs/hip/ knees no sob or increase leg swelling. >>labs done w/no rx changes   September 11, 2010 --Pt here for follow and med calendar. Last ov with CPX. Labs were w/no significant changes. A1C at 6.7 , s.CR holding at 1.4 . We reviewed all her labs in detail.Urine did show microscopic hematuria . Discussed a DM and low cholestrol diet. She is recovering from a prolonged bronchitis-feeling better but not back to baseline. She has clear drainge and energy level is low. We reviewed all her meds and organized her med calendar.  rec follow calendar  04/24/2011 f/u ov/Wert lost calendar, confused with med instructions, c/o increased leg swelling, not using prn lasix as per prev action plan.  Sleeping ok without nocturnal  or early am exacerbation  of respiratory  c/o's.  >labs showed BS at 300 nonfasting.   05/10/2011. Follow up and med review.  Seen 2 weeks ago for follow up . Labs showed BS elevated at 300 nonfasting.  Disucssed diet and exercise. Weight is going down with dietary changes.  Will check A1C today to evaluate average control.   We reviewed all her meds today and updated her med calendar w/ pt education  Appears she is taking her meds correctly.   Complains that she has seen small specks of red blood on tissues with wiping after bowel movement   Last colonoscopy was 2009.  No abdominal pain or n/v/d.  rec Follow med calendar closely and bring to each visit.  Use Preparation H Hemorrhoid cream after bowel movement  Low sweet diet.   Flu shot today   07/26/2011 Wert/ ov cc Pt c/o increased edema "from the hips down" x 2 months - taking an extra furosemide as per calendar "maybe once a week" with no benefit  (calendar clearly says take one extra daily if needed for swelling).  Denies orthopnea. Walked up [place] to office on day of ov s doe. Denies salt excess and takes her maintenance doses of furosemide and spironlactone as per calendar instructions rec Change the as needed furosemide to 40 mg 2 extra daily if needed (can use up to 2 extra daily or a total of 4)  Watch the salt and salty food  08/23/2011 f/u ov/Wert cc no better swelling in legs on max doses of furosemide as per calendar, new  Bifrontal L > R bandlike pressure  HA typically after lunch daily x sev weeks, ? viz Floaters not directely related to ha with no nausea  Or sinus complaints, better p tylenol and never in ams  Sleeping ok without nocturnal  or early am exacerbation  of respiratory c/os. Also denies any obvious fluctuation of symptoms with weather or environmental changes or other aggravating or alleviating factors except as outlined above   ROS  At present neg for  any significant sore throat, dysphagia, itching, sneezing,  nasal congestion or excess/ purulent secretions,  fever, chills, sweats, unintended wt loss, pleuritic or exertional cp, hempoptysis, orthopnea pnd..  Also denies presyncope, palpitations, heartburn, abdominal pain, nausea, vomiting, diarrhea  or change in bowel or urinary habits, dysuria,hematuria,  rash, arthralgias, numbness weakness or ataxia.  Allergies    1) Pcn  2) Bactrim      Family History:   asthma in her brother who also has hypertension  mother died of stroke, also had diabetes and arthritis  father had multiple myeloma  4 siblings all in relatively good health    Social History:  Patient never smoked.  positive for second-hand smoke exposure  exercises 2-3 times a week  1 cup caffeine a day  married  1 child  Risk Factors:  Smoking Status: never     Past Medical History:  GERD (ICD-530.81)  ABDOMINAL PAIN, CHRONIC (ICD-789.00)  FATIGUE, CHRONIC  (ICD-780.79)  DEGENERATIVE JOINT DISEASE (ICD-715.90)  - L5 radiculopathy confirmed by mri 08/2005  --refer to ortho August 24, 2008 >>> Dr Darrelyn Hillock  HYPERTENSIVE CARDIOVASCULAR DISEASE (ICD-402.90)  OBESITY (ICD-278.00)  - Target wt = 158 for BMI < 30  - Referred [facility] 04/2005  GASTRITIS, CHRONIC (ICD-535.10)  BENIGN NEOPLASM OTH&UNSPEC SITE DIGESTIVE SYSTEM (ICD-211.9)  ESOPHAGEAL STRICTURE (ICD-530.3)..............................................Marland KitchenPatterson  - EGD 12/14/07  HIATAL HERNIA (ICD-553.3)  COLONIC POLYPS, ADENOMATOUS (ICD-211.3)  - Colonoscopy 12/14/07  DIVERTICULOSIS, COLON (ICD-562.10)  DYSLIPIDEMIA (ICD-272.4)  ARTHRITIS (ICD-716.90)  DM (ICD-250.00)  HYPERTENSION (ICD-401.9)  HEALTH MAINTENANCE  - dt 03/2003  - Pneumovax 03/2003  - CPX August 28, 2010  -Mammogram 10/2009, ? abn asymmetry >>repeat in 6 months  MICROCYTIC ANEMA  - DX 02/03/09, Stool g neg x 6, rx Fe 65 mg /day > improving March 17, 2009  Edema--08/10/08--venous doppler neg for dvt, ?ruptured bakers cyst-right  March 20, 2009 repeat venous doppler > neg bilaterally  May 21, 2010 repeat venous dopplers >> neg bilat  Complex med regimen>>Meds reviewed with pt education and computerized med calendar September 11, 2010 , 05/10/2011                 Objective:   Physical Exam obese ambulatory bf nad.  wt 213 10/21/08 > 204 April 26, 2009 > 196 February 19, 2010  > 199 August 28, 2010 >> 203 September 11, 2010 > 196 04/24/2011 >>188 05/10/2011  > 07/26/2011  200 >08/23/2011  191  HEENT: nl dentition, turbinates, and orophanx. Nl external ear canals without cough reflex  Neck without JVD/Nodes/TM  Lungs clear to A and P bilaterally without cough on insp or exp maneuvers  RRR no s3 or murmur or increase in P2, trace edema only sym bilaterally  Abd soft and benign with nl excursion in the supine position. No bruits or organomegaly  Rectal exam: nml sphincter tone, neg occult stool , 2 small external  hemorrhoids noted  EXT : severe pitting edema, venous insufficiency changes w/ stasis dermatitis changes R > L    Assessment:        Plan:

## 2011-08-23 NOTE — Patient Instructions (Signed)
Please see patient coordinator before you leave today  to schedule venous dopplers  Stop all caffeine - pay attention to the chronologic pattern of the headache and try alprazolam to see if it helps and call if worsens  See [name] for arthritis  See calendar for specific medication instructions and bring it back for each and every office visit for every healthcare provider you see.  Without it,  you may not receive the best quality medical care that we feel you deserve.  You will note that the calendar groups together  your maintenance  medications that are timed at particular times of the day.  Think of this as your checklist for what your doctor has instructed you to do until your next evaluation to see what benefit  there is  to staying on a consistent group of medications intended to keep you well.  The other group at the bottom is entirely up to you to use as you see fit  for specific symptoms that may arise between visits that require you to treat them on an as needed basis.  Think of this as your action plan or "what if" list.   Separating the top medications from the bottom group is fundamental to providing you adequate care going forward.    Please schedule a follow up visit in 3 months but call sooner if needed

## 2011-08-24 DIAGNOSIS — R51 Headache: Secondary | ICD-10-CM | POA: Insufficient documentation

## 2011-08-24 DIAGNOSIS — R519 Headache, unspecified: Secondary | ICD-10-CM | POA: Insufficient documentation

## 2011-08-24 NOTE — Assessment & Plan Note (Signed)
Lab Results  Component Value Date   CREATININE 1.3* 08/23/2011   CREATININE 1.1 07/26/2011   CREATININE 1.4* 04/24/2011    Adequate control on present rx, reviewed rx with diuretics    Each maintenance medication was reviewed in detail including most importantly the difference between maintenance and as needed and under what circumstances the prns are to be used.  This was done in the context of a medication calendar review which provided the patient with a user-friendly unambiguous mechanism for medication administration and reconciliation and provides an action plan for all active problems. It is critical that this be shown to every doctor  for modification during the office visit if necessary so the patient can use it as a working document.

## 2011-08-24 NOTE — Assessment & Plan Note (Signed)
New onset pattern classic for tension chronologically with after lunch daily ? Related to blood sugars or caffeine use, reviewed need to monitor this and avoid completely any caffeine for now with f/u ct head if not improved on conservative rx

## 2011-08-24 NOTE — Assessment & Plan Note (Signed)
Nl alb, renal fxn ok, so needs venous dopplers and elevation

## 2011-08-24 NOTE — Assessment & Plan Note (Signed)
Adequate control on present rx, reviewed renal fx also stable despite increase use of diuretics, suggesting she's just taking in too much salt as cause for swelling

## 2011-08-24 NOTE — Assessment & Plan Note (Signed)
Adequate control on present rx, reviewed rx 

## 2011-08-27 ENCOUNTER — Encounter (INDEPENDENT_AMBULATORY_CARE_PROVIDER_SITE_OTHER): Payer: Medicare Other | Admitting: *Deleted

## 2011-08-27 DIAGNOSIS — R609 Edema, unspecified: Secondary | ICD-10-CM

## 2011-08-28 NOTE — Progress Notes (Signed)
Quick Note:  Spoke with pt and notified of results per Dr. Wert. Pt verbalized understanding and denied any questions.  ______ 

## 2011-09-02 ENCOUNTER — Encounter: Payer: Self-pay | Admitting: Internal Medicine

## 2011-09-02 NOTE — Progress Notes (Signed)
Quick Note:  Spoke with pt and notified of results per Dr. Wert. Pt verbalized understanding and denied any questions.  ______ 

## 2011-11-25 ENCOUNTER — Ambulatory Visit (INDEPENDENT_AMBULATORY_CARE_PROVIDER_SITE_OTHER): Payer: Medicare Other | Admitting: Internal Medicine

## 2011-11-25 ENCOUNTER — Encounter: Payer: Self-pay | Admitting: Internal Medicine

## 2011-11-25 ENCOUNTER — Other Ambulatory Visit (INDEPENDENT_AMBULATORY_CARE_PROVIDER_SITE_OTHER): Payer: Medicare Other

## 2011-11-25 VITALS — BP 124/66 | HR 96 | Temp 98.3°F | Ht 60.0 in | Wt 201.6 lb

## 2011-11-25 DIAGNOSIS — I10 Essential (primary) hypertension: Secondary | ICD-10-CM

## 2011-11-25 DIAGNOSIS — E119 Type 2 diabetes mellitus without complications: Secondary | ICD-10-CM

## 2011-11-25 DIAGNOSIS — R609 Edema, unspecified: Secondary | ICD-10-CM

## 2011-11-25 DIAGNOSIS — M129 Arthropathy, unspecified: Secondary | ICD-10-CM

## 2011-11-25 DIAGNOSIS — R51 Headache: Secondary | ICD-10-CM

## 2011-11-25 DIAGNOSIS — N189 Chronic kidney disease, unspecified: Secondary | ICD-10-CM

## 2011-11-25 LAB — BASIC METABOLIC PANEL
BUN: 19 mg/dL (ref 6–23)
CO2: 26 mEq/L (ref 19–32)
Chloride: 104 mEq/L (ref 96–112)
Creatinine, Ser: 1.5 mg/dL — ABNORMAL HIGH (ref 0.4–1.2)
Glucose, Bld: 301 mg/dL — ABNORMAL HIGH (ref 70–99)

## 2011-11-25 LAB — LIPID PANEL
HDL: 55.5 mg/dL (ref >39.00)
LDL Cholesterol: 90 mg/dL (ref 0–99)
Total CHOL/HDL Ratio: 3
VLDL: 33 mg/dL (ref 0.0–40.0)

## 2011-11-25 NOTE — Progress Notes (Signed)
Subjective:     Patient ID: Allison Rodgers, female   DOB: [birth year]   MRN: [id number]  Brief patient profile:  [phone] with morbid obesity complicated by DM, HTN and Dyslipidemia and chronic venous insufficiency with dependent edema.     August 28, 2010 cpx getting all better. limited by legs/hip/ knees no sob or increase leg swelling. >>labs done w/no rx changes   September 11, 2010 --Pt here for follow and med calendar. Last ov with CPX. Labs were w/no significant changes. A1C at 6.7 , s.CR holding at 1.4 . We reviewed all her labs in detail.Urine did show microscopic hematuria . Discussed a DM and low cholestrol diet. She is recovering from a prolonged bronchitis-feeling better but not back to baseline. She has clear drainge and energy level is low. We reviewed all her meds and organized her med calendar.  rec follow calendar   05/10/2011. Follow up and med review.  Seen 2 weeks ago for follow up . Labs showed BS elevated at 300 nonfasting.  Disucssed diet and exercise. Weight is going down with dietary changes.  Will check A1C today to evaluate average control.   We reviewed all her meds today and updated her med calendar w/ pt education  Appears she is taking her meds correctly.   Complains that she has seen small specks of red blood on tissues with wiping after bowel movement   Last colonoscopy was 2009.  No abdominal pain or n/v/d.  rec Follow med calendar closely and bring to each visit.  Use Preparation H Hemorrhoid cream after bowel movement  Low sweet diet.   Flu shot today   07/26/2011 Allison Rodgers/ ov cc Pt c/o increased edema "from the hips down" x 2 months - taking an extra furosemide as per calendar "maybe once a week" with no benefit (calendar clearly says take one extra daily if needed for swelling).  Denies orthopnea. Walked [place] to office on day of ov s doe. Denies salt excess and takes her maintenance doses of furosemide and spironlactone as per calendar  instructions rec Change the as needed furosemide to 40 mg 2 extra daily if needed (can use up to 2 extra daily or a total of 4) Watch the salt and salty food  08/23/2011 f/u ov/Allison Rodgers cc no better swelling in legs on max doses of furosemide as per calendar, new  Bifrontal L > R bandlike pressure  HA typically after lunch daily x sev weeks, ? viz Floaters not directely related to ha with no nausea  Or sinus complaints, better p tylenol and never in ams rec Please see patient coordinator before you leave today  to schedule venous dopplers> neg bilateral 08/27/11 Stop all caffeine - pay attention to the chronologic pattern of the headache and try alprazolam to see if it helps and call if worsens See [name] for arthritis > did not go     11/25/2011 f/u ov/Allison Rodgers for multiple chronic medical problems cc worse arthritis esp R back and leg x one year, swelling is some better on present rx reviewed with her on med calendar. No sob, cough. Better control of leg swelling, no overt polydypsia, polyuria, viz changes  Sleeping ok without nocturnal  or early am exacerbation  of respiratory c/os. Also denies any obvious fluctuation of symptoms with weather or environmental changes or other aggravating or alleviating factors except as outlined above   ROS  At present neg for  any significant sore throat, dysphagia, itching, sneezing,  nasal congestion or excess/ purulent secretions,  fever, chills, sweats, unintended wt loss, pleuritic or exertional cp, hempoptysis, orthopnea pnd..  Also denies presyncope, palpitations, heartburn, abdominal pain, nausea, vomiting, diarrhea  or change in bowel or urinary habits, dysuria,hematuria,  rash, arthralgias, numbness weakness or ataxia.       Allergies    1) Pcn  2) Bactrim      Family History:   asthma in her brother who also has hypertension  mother died of stroke, also had diabetes and arthritis  father had multiple myeloma  4 siblings all in  relatively good health    Social History:  Patient never smoked.  positive for second-hand smoke exposure  exercises 2-3 times a week  1 cup caffeine a day  married  1 child  Risk Factors:  Smoking Status: never     Past Medical History:  GERD (ICD-530.81)  ABDOMINAL PAIN, CHRONIC (ICD-789.00)  FATIGUE, CHRONIC (ICD-780.79)  DEGENERATIVE JOINT DISEASE (ICD-715.90)  - L5 radiculopathy confirmed by mri 08/2005  --refer to ortho August 24, 2008 >>> Dr Darrelyn Hillock  HYPERTENSIVE CARDIOVASCULAR DISEASE (ICD-402.90)  OBESITY (ICD-278.00)  - Target wt = 158 for BMI < 30  - Referred [facility] 04/2005  GASTRITIS, CHRONIC (ICD-535.10)  BENIGN NEOPLASM OTH&UNSPEC SITE DIGESTIVE SYSTEM (ICD-211.9)  ESOPHAGEAL STRICTURE (ICD-530.3)..............................................Marland KitchenPatterson  - EGD 12/14/07  HIATAL HERNIA (ICD-553.3)  COLONIC POLYPS, ADENOMATOUS (ICD-211.3)  - Colonoscopy 12/14/07  DIVERTICULOSIS, COLON (ICD-562.10)  DYSLIPIDEMIA (ICD-272.4)  ARTHRITIS (ICD-716.90)  DM (ICD-250.00)  HYPERTENSION (ICD-401.9)  HEALTH MAINTENANCE  - dt 03/2003  - Pneumovax 03/2003  - CPX August 28, 2010  -Mammogram 10/2009, ? abn asymmetry >>repeat in 6 months  MICROCYTIC ANEMA  - DX 02/03/09, Stool g neg x 6, rx Fe 65 mg /day > improving March 17, 2009  Edema--08/10/08--venous doppler neg for dvt, ?ruptured bakers cyst-right  March 20, 2009 repeat venous doppler > neg bilaterally  May 21, 2010 repeat venous dopplers >> neg bilat  Complex med regimen>>Meds reviewed with pt education and computerized med calendar September 11, 2010 , 05/10/2011                 Objective:   Physical Exam obese ambulatory bf nad.  wt 213 10/21/08 > 204 April 26, 2009 > 08/23/2011  191> 11/25/2011  201 HEENT: nl dentition, turbinates, and orophanx. Nl external ear canals without cough reflex  Neck without JVD/Nodes/TM  Lungs clear to A and P bilaterally without cough on insp or exp maneuvers  RRR  no s3 or murmur or increase in P2, trace edema only sym bilaterally  Abd soft and benign with nl excursion in the supine position. No bruits or organomegaly  Rectal exam: nml sphincter tone, neg occult stool , 2 small external hemorrhoids noted  EXT : moderate bilateral pitting edema, venous insufficiency changes w/ stasis dermatitis changes R > L    Assessment:        Plan:

## 2011-11-25 NOTE — Progress Notes (Signed)
Quick Note:  Spoke with pt and notified of results per Dr. Wert. Pt verbalized understanding and denied any questions.  ______ 

## 2011-11-25 NOTE — Assessment & Plan Note (Signed)
Adequate control on present rx, reviewed  

## 2011-11-25 NOTE — Patient Instructions (Addendum)
Be sure to see Dr Darrelyn Hillock about your back and leg pain  Please remember to go to the lab  department downstairs for your tests - we will call you with the results when they are available.     See calendar for specific medication instructions and bring it back for each and every office visit for every healthcare provider you see.  Without it,  you may not receive the best quality medical care that we feel you deserve.  You will note that the calendar groups together  your maintenance  medications that are timed at particular times of the day.  Think of this as your checklist for what your doctor has instructed you to do until your next evaluation to see what benefit  there is  to staying on a consistent group of medications intended to keep you well.  The other group at the bottom is entirely up to you to use as you see fit  for specific symptoms that may arise between visits that require you to treat them on an as needed basis.  Think of this as your action plan or "what if" list.   Separating the top medications from the bottom group is fundamental to providing you adequate care going forward.    Please schedule a follow up visit in 3 months but call sooner if needed   Late add: ? Has she tried metformin ? Can it be used with creat 1.5 ?

## 2011-11-26 NOTE — Assessment & Plan Note (Signed)
-   Venous dopplers 08/27/11 > neg bilaterally  Adequate control on present rx, reviewed

## 2011-11-26 NOTE — Assessment & Plan Note (Signed)
Lab Results  Component Value Date   CREATININE 1.5* 11/25/2011   CREATININE 1.3* 08/23/2011   CREATININE 1.1 07/26/2011    Worsened with diuresis but no great options to keep legs less swollen, will leave on present rx

## 2011-11-26 NOTE — Assessment & Plan Note (Signed)
Adequate control on present rx, reviewed  

## 2011-11-26 NOTE — Assessment & Plan Note (Addendum)
Not Adequate control on present rx, reviewed options, prefers diet over more meds

## 2011-11-26 NOTE — Assessment & Plan Note (Signed)
Can't tol nsaids due to renal/ GI complaints  Referred back to [facility] for back pain radiating to legs

## 2011-12-06 ENCOUNTER — Encounter: Payer: Self-pay | Admitting: Internal Medicine

## 2012-02-11 ENCOUNTER — Other Ambulatory Visit: Payer: Self-pay | Admitting: Internal Medicine

## 2012-03-02 ENCOUNTER — Other Ambulatory Visit (INDEPENDENT_AMBULATORY_CARE_PROVIDER_SITE_OTHER): Payer: Medicare Other

## 2012-03-02 ENCOUNTER — Encounter: Payer: Self-pay | Admitting: Internal Medicine

## 2012-03-02 ENCOUNTER — Ambulatory Visit (INDEPENDENT_AMBULATORY_CARE_PROVIDER_SITE_OTHER): Payer: Medicare Other | Admitting: Internal Medicine

## 2012-03-02 VITALS — BP 118/66 | HR 89 | Temp 98.4°F | Ht 60.0 in | Wt 193.0 lb

## 2012-03-02 DIAGNOSIS — E119 Type 2 diabetes mellitus without complications: Secondary | ICD-10-CM

## 2012-03-02 DIAGNOSIS — R413 Other amnesia: Secondary | ICD-10-CM

## 2012-03-02 DIAGNOSIS — E785 Hyperlipidemia, unspecified: Secondary | ICD-10-CM

## 2012-03-02 DIAGNOSIS — R51 Headache: Secondary | ICD-10-CM

## 2012-03-02 DIAGNOSIS — I10 Essential (primary) hypertension: Secondary | ICD-10-CM

## 2012-03-02 DIAGNOSIS — N189 Chronic kidney disease, unspecified: Secondary | ICD-10-CM

## 2012-03-02 LAB — HEMOGLOBIN A1C: Hgb A1c MFr Bld: 8.2 % — ABNORMAL HIGH (ref 4.6–6.5)

## 2012-03-02 LAB — BASIC METABOLIC PANEL
BUN: 17 mg/dL (ref 6–23)
GFR: 45.42 mL/min — ABNORMAL LOW (ref >60.00)
Potassium: 4.3 mEq/L (ref 3.5–5.1)
Sodium: 140 mEq/L (ref 135–145)

## 2012-03-02 NOTE — Patient Instructions (Signed)
Change the furosemide to 2 in am and at lunch with additonal 2 at supper if needed  Increase Glimperide to 2 mg each am  Please remember to go to the lab  department downstairs for your tests - we will call you with the results when they are available.  See Tammy NP w/in 2 weeks with all your medications, even over the counter meds, separated in two separate bags, the ones you take no matter what vs the ones you stop once you feel better and take only as needed when you feel you need them.   Tammy  will generate for you a new user friendly medication calendar that will put [place] all on the same page re: your medication use.     Without this process, it simply isn't possible to assure that we are providing  your outpatient care  with  the attention to detail we feel you deserve.   If we cannot assure that you're getting that kind of care,  then we cannot manage your problem effectively from this clinic.  Once you have seen Tammy and we are sure that we're all on the same page with your medication use she will arrange follow up with me.   Tammy will also do your memory testing.

## 2012-03-02 NOTE — Progress Notes (Signed)
Subjective:     Patient ID: Allison Rodgers, female   DOB: 11/24/1923   MRN: [id number]  Brief patient profile:  [id number] with morbid obesity complicated by DM, HTN and Dyslipidemia and chronic venous insufficiency with dependent edema.     August 28, 2010 cpx getting all better. limited by legs/hip/ knees no sob or increase leg swelling. >>labs done w/no rx changes   September 11, 2010 --Pt here for follow and med calendar. Last ov with CPX. Labs were w/no significant changes. A1C at 6.7 , s.CR holding at 1.4 . We reviewed all her labs in detail.Urine did show microscopic hematuria . Discussed a DM and low cholestrol diet. She is recovering from a prolonged bronchitis-feeling better but not back to baseline. She has clear drainge and energy level is low. We reviewed all her meds and organized her med calendar.  rec follow calendar   05/10/2011. Follow up and med review.  Seen 2 weeks ago for follow up . Labs showed BS elevated at 300 nonfasting.  Disucssed diet and exercise. Weight is going down with dietary changes.  Will check A1C today to evaluate average control.   We reviewed all her meds today and updated her med calendar w/ pt education  Appears she is taking her meds correctly.   Complains that she has seen small specks of red blood on tissues with wiping after bowel movement   Last colonoscopy was 2009.  No abdominal pain or n/v/d.  rec Follow med calendar closely and bring to each visit.  Use Preparation H Hemorrhoid cream after bowel movement  Low sweet diet.   Flu shot today   07/26/2011 Allison Rodgers/ ov cc Pt c/o increased edema "from the hips down" x 2 months - taking an extra furosemide as per calendar "maybe once a week" with no benefit (calendar clearly says take one extra daily if needed for swelling).  Denies orthopnea. Walked up steep hill to office on day of ov s doe. Denies salt excess and takes her maintenance doses of furosemide and spironlactone as per calendar  instructions rec Change the as needed furosemide to 40 mg 2 extra daily if needed (can use up to 2 extra daily or a total of 4) Watch the salt and salty food  08/23/2011 f/u ov/Allison Rodgers cc no better swelling in legs on max doses of furosemide as per calendar, new  Bifrontal L > R bandlike pressure  HA typically after lunch daily x sev weeks, ? viz Floaters not directely related to ha with no nausea  Or sinus complaints, better p tylenol and never in ams rec Please see patient coordinator before you leave today  to schedule venous dopplers> neg bilateral 08/27/11 Stop all caffeine - pay attention to the chronologic pattern of the headache and try alprazolam to see if it helps and call if worsens See Allison Rodgers as needed for arthritis > did not go     11/25/2011 f/u ov/Allison Rodgers for multiple chronic medical problems cc worse arthritis esp R back and leg x one year, swelling is some better on present rx reviewed with her on med calendar. No sob, cough. Better control of leg swelling, no overt polydypsia, polyuria, viz changes rec Be sure to see Allison Rodgers about your back and leg pain Please remember to go to the lab  department downstairs for your tests - we will call you with the results when they are available.   03/02/2012 f/u ov/Allison Rodgers not using med calendar cc  c/o having slight HA  x 3 months and states "feels bad and weak all of the time". Sugars fasting running as high has 300 fasting on blimepririede 2 mg one half each am, persistent leg swelling despite furosemide and lasix. Admits getting very forgetful re details of care and trying to use the alphabetized epic list for med rec purposes.  HA is generalized, in pms, better with tylenol with no assoc viz or neuro c/os or nausea, always absent in ams    No obvious daytime variabilty to symptoms or assoc chronic cough or cp or chest tightness, subjective wheeze overt sinus or hb symptoms. No unusual exp hx or h/o childhood pna/ asthma or premature birth to  his knowledge.   Sleeping ok without nocturnal  or early am exacerbation  of respiratory c/os. Also denies any obvious fluctuation of symptoms with weather or environmental changes or other aggravating or alleviating factors except as outlined above   ROS  The following are not active complaints unless bolded sore throat, dysphagia, dental problems, itching, sneezing,  nasal congestion or excess/ purulent secretions, ear ache,   fever, chills, sweats, unintended wt loss, pleuritic or exertional cp, hemoptysis,  orthopnea pnd or leg swelling, presyncope, palpitations, heartburn, abdominal pain, anorexia, nausea, vomiting, diarrhea  or change in bowel or urinary habits, change in stools or urine, dysuria,hematuria,  rash, arthralgias, visual complaints, headache, numbness weakness or ataxia or problems with walking or coordination,  change in mood/affect or memory.           Allergies    1) Pcn  2) Bactrim      Family History:   asthma in her brother who also has hypertension  mother died of stroke, also had diabetes and arthritis  father had multiple myeloma  4 siblings all in relatively good health    Social History:  Patient never smoked.  positive for second-hand smoke exposure  exercises 2-3 times a week  1 cup caffeine a day  married  1 child  Risk Factors:  Smoking Status: never     Past Medical History:  GERD (ICD-530.81)  ABDOMINAL PAIN, CHRONIC (ICD-789.00)  FATIGUE, CHRONIC (ICD-780.79)  DEGENERATIVE JOINT DISEASE (ICD-715.90)  - L5 radiculopathy confirmed by mri 08/2005  --refer to ortho August 24, 2008 >>> Allison Rodgers  HYPERTENSIVE CARDIOVASCULAR DISEASE (ICD-402.90)  OBESITY (ICD-278.00)  - Target wt = 158 for BMI < 30  - Referred [facility] 04/2005  GASTRITIS, CHRONIC (ICD-535.10)  BENIGN NEOPLASM OTH&UNSPEC SITE DIGESTIVE SYSTEM (ICD-211.9)  ESOPHAGEAL STRICTURE (ICD-530.3)..............................................Marland KitchenPatterson  - EGD 12/14/07   HIATAL HERNIA (ICD-553.3)  COLONIC POLYPS, ADENOMATOUS (ICD-211.3)  - Colonoscopy 12/14/07  DIVERTICULOSIS, COLON (ICD-562.10)  DYSLIPIDEMIA (ICD-272.4)  ARTHRITIS (ICD-716.90)  DM (ICD-250.00)  HYPERTENSION (ICD-401.9)  HEALTH MAINTENANCE  - dt 03/2003  - Pneumovax 03/2003  - CPX August 28, 2010  -Mammogram 10/2009, ? abn asymmetry >>repeat in 6 months  MICROCYTIC ANEMA  - DX 02/03/09, Stool g neg x 6, rx Fe 65 mg /day > improving March 17, 2009  Edema--08/10/08--venous doppler neg for dvt, ?ruptured bakers cyst-right  March 20, 2009 repeat venous doppler > neg bilaterally  May 21, 2010 repeat venous dopplers >> neg bilat  Complex med regimen>>Meds reviewed with pt education and computerized med calendar September 11, 2010 , 05/10/2011                 Objective:   Physical Exam obese ambulatory bf nad.  wt 213 10/21/08 > 204 April 26, 2009 > 08/23/2011  191> 11/25/2011  201 > 03/02/2012  193 HEENT: nl dentition, turbinates, and orophanx. Nl external ear canals without cough reflex  Neck without JVD/Nodes/TM  Lungs clear to A and P bilaterally without cough on insp or exp maneuvers  RRR no s3 or murmur or increase in P2, trace edema only sym bilaterally  Abd soft and benign with nl excursion in the supine position. No bruits or organomegaly  Rectal exam: nml sphincter tone, neg occult stool , 2 small external hemorrhoids noted  EXT : moderate bilateral pitting edema, venous insufficiency changes w/ stasis dermatitis changes R > L    Assessment:        Plan:

## 2012-03-02 NOTE — Progress Notes (Signed)
Quick Note:  Called and spoke with patient, informed her of results and recs as listed below per Dr. [name]. Patient verbalized understanding and had no other questions or concerns at this time.  Result Note   Call patient : Study is as expected, sugars too high, o/w ok   recd no change in recs we made which should start correcting her high sugars gradually   ______

## 2012-03-03 DIAGNOSIS — R413 Other amnesia: Secondary | ICD-10-CM | POA: Insufficient documentation

## 2012-03-03 NOTE — Assessment & Plan Note (Signed)
Stereotypical and c/w tension , rx tylenol only for now

## 2012-03-03 NOTE — Assessment & Plan Note (Signed)
-   Target LDL < 70 due to dm  Adequate control on present rx, reviewed  

## 2012-03-03 NOTE — Assessment & Plan Note (Signed)
Based on affect and demeanor this may just be depression related since also has mild tension ha Need mmse and ct head if abn, if not consider trial of trazadone.  No changes is rx though until we do a complete and accurate med rec

## 2012-03-03 NOTE — Assessment & Plan Note (Signed)
Complicated by mild cri, Adequate control on present rx, reviewed

## 2012-03-03 NOTE — Assessment & Plan Note (Signed)
Lab Results  Component Value Date   CREATININE 1.4* 03/02/2012   CREATININE 1.5* 11/25/2011   CREATININE 1.3* 08/23/2011    No real change in baseline

## 2012-03-03 NOTE — Assessment & Plan Note (Signed)
Not Adequate control on present rx, reviewed options > increase amaryl to 2 mg daily  I had an extended discussion with the patient today lasting 15 to 20 minutes of a 25 minute visit on the following issues:  To keep things simple, I have asked the patient to first separate medicines that are perceived as maintenance, that is to be taken daily "no matter what", from those medicines that are taken on only on an as-needed basis and I have given the patient examples of both, and then return to see our NP to generate a  detailed  medication calendar which should be followed until the next physician sees the patient and updates it.

## 2012-03-18 ENCOUNTER — Ambulatory Visit (INDEPENDENT_AMBULATORY_CARE_PROVIDER_SITE_OTHER): Payer: Medicare Other | Admitting: Adult Health

## 2012-03-18 ENCOUNTER — Other Ambulatory Visit (INDEPENDENT_AMBULATORY_CARE_PROVIDER_SITE_OTHER): Payer: Medicare Other

## 2012-03-18 ENCOUNTER — Encounter: Payer: Self-pay | Admitting: Adult Health

## 2012-03-18 VITALS — BP 140/72 | HR 85 | Temp 97.5°F | Ht 60.0 in | Wt 198.0 lb

## 2012-03-18 DIAGNOSIS — E119 Type 2 diabetes mellitus without complications: Secondary | ICD-10-CM

## 2012-03-18 DIAGNOSIS — R413 Other amnesia: Secondary | ICD-10-CM

## 2012-03-18 LAB — CBC WITH DIFFERENTIAL/PLATELET
Basophils Absolute: 0.1 10*3/uL (ref 0.0–0.1)
Basophils Relative: 0.9 % (ref 0.0–3.0)
Eosinophils Absolute: 0.1 10*3/uL (ref 0.0–0.7)
Lymphocytes Relative: 22.6 % (ref 12.0–46.0)
MCHC: 31.1 g/dL (ref 30.0–36.0)
Neutrophils Relative %: 67.3 % (ref 43.0–77.0)
RBC: 5.19 Mil/uL — ABNORMAL HIGH (ref 3.87–5.11)
RDW: 13.8 % (ref 11.5–14.6)

## 2012-03-18 LAB — VITAMIN B12: Vitamin B-12: 442 pg/mL (ref 211–911)

## 2012-03-18 LAB — TSH: TSH: 1.04 u[IU]/mL (ref 0.35–5.50)

## 2012-03-18 MED ORDER — DONEPEZIL HCL 5 MG PO TABS: 5.0000 mg | ORAL_TABLET | Freq: Every day | ORAL | Status: DC

## 2012-03-18 NOTE — Progress Notes (Signed)
Subjective:     Patient ID: Allison Rodgers, female   DOB: 08/14/1923   MRN: [id number]  Brief patient profile:  [phone] with morbid obesity complicated by DM, HTN and Dyslipidemia and chronic venous insufficiency with dependent edema.     August 28, 2010 cpx getting all better. limited by legs/hip/ knees no sob or increase leg swelling. >>labs done w/no rx changes   September 11, 2010 --Pt here for follow and med calendar. Last ov with CPX. Labs were w/no significant changes. A1C at 6.7 , s.CR holding at 1.4 . We reviewed all her labs in detail.Urine did show microscopic hematuria . Discussed a DM and low cholestrol diet. She is recovering from a prolonged bronchitis-feeling better but not back to baseline. She has clear drainge and energy level is low. We reviewed all her meds and organized her med calendar.  rec follow calendar   05/10/2011. Follow up and med review.  Seen 2 weeks ago for follow up . Labs showed BS elevated at 300 nonfasting.  Disucssed diet and exercise. Weight is going down with dietary changes.  Will check A1C today to evaluate average control.   We reviewed all her meds today and updated her med calendar w/ pt education  Appears she is taking her meds correctly.   Complains that she has seen small specks of red blood on tissues with wiping after bowel movement   Last colonoscopy was 2009.  No abdominal pain or n/v/d.  rec Follow med calendar closely and bring to each visit.  Use Preparation H Hemorrhoid cream after bowel movement  Low sweet diet.   Flu shot today   07/26/2011 Wert/ ov cc Pt c/o increased edema "from the hips down" x 2 months - taking an extra furosemide as per calendar "maybe once a week" with no benefit (calendar clearly says take one extra daily if needed for swelling).  Denies orthopnea. Walked [place] to office on day of ov s doe. Denies salt excess and takes her maintenance doses of furosemide and spironlactone as per calendar  instructions rec Change the as needed furosemide to 40 mg 2 extra daily if needed (can use up to 2 extra daily or a total of 4) Watch the salt and salty food  08/23/2011 f/u ov/Wert cc no better swelling in legs on max doses of furosemide as per calendar, new  Bifrontal L > R bandlike pressure  HA typically after lunch daily x sev weeks, ? viz Floaters not directely related to ha with no nausea  Or sinus complaints, better p tylenol and never in ams rec Please see patient coordinator before you leave today  to schedule venous dopplers> neg bilateral 08/27/11 Stop all caffeine - pay attention to the chronologic pattern of the headache and try alprazolam to see if it helps and call if worsens See [name] for arthritis > did not go     11/25/2011 f/u ov/Wert for multiple chronic medical problems cc worse arthritis esp R back and leg x one year, swelling is some better on present rx reviewed with her on med calendar. No sob, cough. Better control of leg swelling, no overt polydypsia, polyuria, viz changes rec Be sure to see Dr Darrelyn Hillock about your back and leg pain Please remember to go to the lab  department downstairs for your tests - we will call you with the results when they are available.   03/02/2012 f/u ov/Wert not using med calendar cc  c/o having slight HA  x 3 months and states "feels bad and weak all of the time". Sugars fasting running as high has 300 fasting on glimepririede 2 mg one half each am, persistent leg swelling despite furosemide and lasix. Admits getting very forgetful re details of care and trying to use the alphabetized epic list for med rec purposes.  HA is generalized, in pms, better with tylenol with no assoc viz or neuro c/os or nausea, always absent in ams >>Change the furosemide to 2 in am and at lunch with additonal 2 at supper if needed  Increase Glimperide to 2 mg each am   03/18/2012 Follow up and MMSE /Med calendar  We reviewed all her meds and organized  them into a med calendar with pt education  She has a lot of old bottles that she says she keeps and put new meds into old bottles  Advised this is not a good idea.  Says her memory is slipping. Gets anxious when she can not remember things. Has gotten lost a couple of times. Son in [place]. She is considering going to live with him or him come here.  Does not want to go to NH.  MMSE scored a 29/30 but slow responses.  Clock draw was very abnormal. With hands of clock outside clock.  We dicussed referral to neuro due to concern for possible dementia  She refuses to go to neuro wants to be treated here.  Labs were unremarkable with neg RPR , nml tsh and nml b12 She denies headache, visual/speech changes.  Does have anxiety at times. No significant depression like symptoms.  Sugars has been running 200-250 . Amaryl was increased 2 weeks ago w/ A1C at 8.4  No low sugars.               Allergies    1) Pcn  2) Bactrim      Family History:   asthma in her brother who also has hypertension  mother died of stroke, also had diabetes and arthritis  father had multiple myeloma  4 siblings all in relatively good health  Neg for dementia     Social History:  Patient never smoked.  positive for second-hand smoke exposure  exercises 2-3 times a week  1 cup caffeine a day  married  1 child  Risk Factors:  Smoking Status: never     Past Medical History:  GERD (ICD-530.81)  ABDOMINAL PAIN, CHRONIC (ICD-789.00)  FATIGUE, CHRONIC (ICD-780.79)  DEGENERATIVE JOINT DISEASE (ICD-715.90)  - L5 radiculopathy confirmed by mri 08/2005  --refer to ortho August 24, 2008 >>> Dr Darrelyn Hillock  HYPERTENSIVE CARDIOVASCULAR DISEASE (ICD-402.90)  OBESITY (ICD-278.00)  - Target wt = 158 for BMI < 30  - Referred [facility] 04/2005  GASTRITIS, CHRONIC (ICD-535.10)  BENIGN NEOPLASM OTH&UNSPEC SITE DIGESTIVE SYSTEM (ICD-211.9)  ESOPHAGEAL STRICTURE  (ICD-530.3)..............................................Marland KitchenPatterson  - EGD 12/14/07  HIATAL HERNIA (ICD-553.3)  COLONIC POLYPS, ADENOMATOUS (ICD-211.3)  - Colonoscopy 12/14/07  DIVERTICULOSIS, COLON (ICD-562.10)  DYSLIPIDEMIA (ICD-272.4)  ARTHRITIS (ICD-716.90)  DM (ICD-250.00)  HYPERTENSION (ICD-401.9)  HEALTH MAINTENANCE  - dt 03/2003  - Pneumovax 03/2003  - CPX August 28, 2010  -Mammogram 10/2009, ? abn asymmetry >>repeat in 6 months  MICROCYTIC ANEMA  - DX 02/03/09, Stool g neg x 6, rx Fe 65 mg /day > improving March 17, 2009  Edema--08/10/08--venous doppler neg for dvt, ?ruptured bakers cyst-right  March 20, 2009 repeat venous doppler > neg bilaterally  May 21, 2010 repeat venous dopplers >> neg bilat  Complex med regimen>>Meds  reviewed with pt education and computerized med calendar September 11, 2010 , 05/10/2011 , 03/18/2012  Memory loss  MMSE 03/18/2012 > 29/30  Clock Draw abn                Objective:   Physical Exam obese ambulatory bf nad.  wt 213 10/21/08 > 204 April 26, 2009 > 08/23/2011  191> 11/25/2011  201 > 03/02/2012  193 > 198 03/18/2012  HEENT: nl dentition, turbinates, and orophanx. Nl external ear canals without cough reflex  Neck without JVD/Nodes/TM  Lungs clear to A and P bilaterally without cough on insp or exp maneuvers  RRR no s3 or murmur or increase in P2, trace edema only sym bilaterally  Abd soft and benign with nl excursion in the supine position. No bruits or organomegaly  Rectal exam: nml sphincter tone, neg occult stool , 2 small external hemorrhoids noted  EXT : 1+ edema/ venous insufficiency changes     Assessment:        Plan:

## 2012-03-18 NOTE — Patient Instructions (Addendum)
Begin Aricept 5mg  At bedtime  For your memory  Try "brain teasers" -cross word puzzles , reading, making list, etc  Low sweet diet  Weight loss  Increase Lantus 25 units At bedtime   Follow med calendar closely and bring to each visit.  follow up 1 month and As needed

## 2012-03-19 LAB — RPR

## 2012-03-19 NOTE — Assessment & Plan Note (Signed)
Progressive memory impairment with supsected dementia  Refuses neuro referral  MMSE was ok at 29/30 however she seemed to have more difficulty than expected  Clock draw was very abn with hands outside of clock.   Patient's medications were reviewed today and patient education was given. Computerized medication calendar was adjusted/completed   rec  Begin Aricept 5mg  At bedtime  For your memory  Try "brain teasers" -cross word puzzles , reading, making list, etc  follow up 1 month and As needed

## 2012-03-19 NOTE — Assessment & Plan Note (Signed)
Not at goal with persistent hyperglycemia Diet discussed  Advised to increase Lantus 25 units Cont on amaryl 2mg  daily  Keep bs log  follow up in 4 weeks  Call if bs <90 or >250

## 2012-03-23 NOTE — Progress Notes (Signed)
Quick Note:  Pt aware of results. ______ 

## 2012-03-30 NOTE — Addendum Note (Signed)
Addended by: Boone Master E on: 03/30/2012 10:15 AM   Modules accepted: Orders

## 2012-04-09 ENCOUNTER — Encounter (HOSPITAL_COMMUNITY): Payer: Self-pay

## 2012-04-09 ENCOUNTER — Emergency Department (HOSPITAL_COMMUNITY): Payer: Medicare Other

## 2012-04-09 ENCOUNTER — Emergency Department (HOSPITAL_COMMUNITY)
Admission: EM | Admit: 2012-04-09 | Discharge: 2012-04-09 | Disposition: A | Discharge status: Home / Self Care | Payer: Medicare Other | Attending: Emergency Medicine | Admitting: Emergency Medicine

## 2012-04-09 DIAGNOSIS — M25519 Pain in unspecified shoulder: Secondary | ICD-10-CM | POA: Insufficient documentation

## 2012-04-09 DIAGNOSIS — I10 Essential (primary) hypertension: Secondary | ICD-10-CM | POA: Insufficient documentation

## 2012-04-09 DIAGNOSIS — G8929 Other chronic pain: Secondary | ICD-10-CM | POA: Insufficient documentation

## 2012-04-09 DIAGNOSIS — E785 Hyperlipidemia, unspecified: Secondary | ICD-10-CM | POA: Insufficient documentation

## 2012-04-09 DIAGNOSIS — M25511 Pain in right shoulder: Secondary | ICD-10-CM

## 2012-04-09 DIAGNOSIS — E669 Obesity, unspecified: Secondary | ICD-10-CM | POA: Insufficient documentation

## 2012-04-09 DIAGNOSIS — Z79899 Other long term (current) drug therapy: Secondary | ICD-10-CM | POA: Insufficient documentation

## 2012-04-09 DIAGNOSIS — E119 Type 2 diabetes mellitus without complications: Secondary | ICD-10-CM | POA: Insufficient documentation

## 2012-04-09 DIAGNOSIS — K219 Gastro-esophageal reflux disease without esophagitis: Secondary | ICD-10-CM | POA: Insufficient documentation

## 2012-04-09 DIAGNOSIS — Z794 Long term (current) use of insulin: Secondary | ICD-10-CM | POA: Insufficient documentation

## 2012-04-09 DIAGNOSIS — M129 Arthropathy, unspecified: Secondary | ICD-10-CM | POA: Insufficient documentation

## 2012-04-09 HISTORY — DX: Disorder of kidney and ureter, unspecified: N28.9

## 2012-04-09 MED ORDER — CYCLOBENZAPRINE HCL 10 MG PO TABS: 5.0000 mg | ORAL_TABLET | Freq: Two times a day (BID) | ORAL | Status: DC | PRN

## 2012-04-09 MED ORDER — TRAMADOL HCL 50 MG PO TABS: 50.0000 mg | ORAL_TABLET | Freq: Four times a day (QID) | ORAL | Status: AC | PRN

## 2012-04-09 MED ORDER — OXYCODONE-ACETAMINOPHEN 5-325 MG PO TABS
2.0000 | ORAL_TABLET | Freq: Once | ORAL | Status: AC
  Administered 2012-04-09: 2 via ORAL
  Filled 2012-04-09: qty 2

## 2012-04-09 MED ORDER — IBUPROFEN 800 MG PO TABS: 800.0000 mg | ORAL_TABLET | Freq: Three times a day (TID) | ORAL | Status: AC

## 2012-04-09 NOTE — ED Notes (Signed)
Patient reports that when she woke this AM she was having pain from right mid forearm to shoulder, especially in the elbow area. Patient reports that she had trouble combing her hair due to pain. Patient able to wiggle fingers on right hand. Patient does report that she ha difficulty writing due to pain in right arm.

## 2012-04-09 NOTE — ED Provider Notes (Signed)
History     CSN: [id number]  Arrival date & time 04/09/12  1357   First MD Initiated Contact with Patient 04/09/12 1513      Chief Complaint  Patient presents with  . Arm Pain  . Elbow Pain    (Consider location/radiation/quality/duration/timing/severity/associated sxs/prior treatment) HPI Comments: Allison Rodgers 78 y.o. female   The chief complaint is: Patient presents with:   Arm Pain   Elbow Pain   The patient has medical history significant for:   Past Medical History:   GERD (gastroesophageal reflux disease)                       Chronic abdominal pain                                       DJD (degenerative joint disease)                             Hypertensive cardiovascular disease                          Obesity                                                      Gastritis, chronic                                           Benign neoplasm of other and unspecified site *              Esophageal stricture                                         Hiatal hernia                                                Hx of adenomatous colonic polyps                             Diverticulosis                                               Dyslipidemia                                                 Arthritis  DM (diabetes mellitus)                                       Hypertension                                                 Microcytic anemia                                            Edema                                                        Renal disorder                                              Patient presents with right arm pain that began when she awoke this mornining. She descibes the pain as shooting from her right forearm up into the shoulder. She states that she has never had this before. Denies shoulder surgery. Patient states the pain in her arm limits her ability to wash her hair or do dishes. She also  reports 3-4 months of difficulty writing. Denies fever or chills. Denies NVD or abdominal pain. Denies CP, palpitations, or SOB. Denies headache or vision changes. Denies numbness, tingling, or weakness. Patient is most concerned that she may have had a stroke.      The history is provided by the patient.    Past Medical History  Diagnosis Date  . GERD (gastroesophageal reflux disease)   . Chronic abdominal pain   . DJD (degenerative joint disease)   . Hypertensive cardiovascular disease   . Obesity   . Gastritis, chronic   . 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 Esophageal stricture   . Hiatal hernia   . Hx of adenomatous colonic polyps   . Diverticulosis   . Dyslipidemia   . Arthritis   . DM (diabetes mellitus)   . Hypertension   . Microcytic anemia   . Edema   . Renal disorder     Past Surgical History  Procedure Date  . Appendectomy   . Cholecystectomy 1999  . Vesicovaginal fistula closure w/ tah 1957  . Bilateral oophorectomy 1957  . Hernia repair 08/2005    umbilical and ventral hernia repair by Dr. Jamey Ripa  . Rotator cuff repair 7/05    right  . Back surgery     History reviewed. No pertinent family history.  History  Substance Use Topics  . Smoking status: Never Smoker   . Smokeless tobacco: Never Used  . Alcohol Use: No    OB History    Grav Para Term Preterm Abortions TAB SAB Ect Mult Living                  Review of Systems  Constitutional: Positive for activity change.  Negative for fever and chills.  Respiratory: Negative for shortness of breath.   Cardiovascular: Negative for chest pain and palpitations.  Gastrointestinal: Negative for nausea, vomiting, abdominal pain and diarrhea.  Musculoskeletal: Positive for myalgias and arthralgias.  Neurological: Negative for numbness and headaches.  All other systems reviewed and are negative.    Allergies  Penicillins; Sulfamethoxazole w-trimethoprim; and  Bactrim  Home Medications   Current Outpatient Rx  Name Route Sig Dispense Refill  . ACETAMINOPHEN 500 MG PO TABS Oral Take 500 mg by mouth every 4 (four) hours as needed. Pain    . ALPRAZOLAM 0.25 MG PO TABS Oral Take 0.25 mg by mouth every 6 (six) hours as needed. Anxiety    . ASPIRIN 81 MG PO CHEW Oral Chew 81 mg by mouth daily.      Marland Kitchen BIMATOPROST 0.03 % OP SOLN Both Eyes Place 1 drop into both eyes at bedtime.     Marland Kitchen CALCIUM PLUS VITAMIN D PO Oral Take 1 tablet by mouth daily.      Marland Kitchen CETIRIZINE HCL 10 MG PO TABS Oral Take 10 mg by mouth daily as needed. Allergies    . DEXTROMETHORPHAN HBR 15 MG/5ML PO SYRP  1-2 tsp every 4 hours as needed for cough    . DONEPEZIL HCL 10 MG PO TABS Oral Take 10 mg by mouth at bedtime.    . DORZOLAMIDE HCL 2 % OP SOLN Both Eyes Place 1 drop into both eyes at bedtime.    Marland Kitchen ESOMEPRAZOLE MAGNESIUM 40 MG PO CPDR Oral Take 40 mg by mouth daily after breakfast.    . FUROSEMIDE 40 MG PO TABS  2 tablets every am and 2 extra if needed for swelling    . GLIMEPIRIDE 2 MG PO TABS Oral Take 2 mg by mouth daily before breakfast.     . INSULIN GLARGINE 100 UNIT/ML [place] SOLN Subcutaneous Inject 25 Units into the skin at bedtime.     . OXYCODONE-ACETAMINOPHEN 5-325 MG/5ML PO SOLN Oral Take 5 mLs by mouth every 6 (six) hours as needed.     Marland Kitchen SALINE NASAL SPRAY 0.65 % NA SOLN Nasal Place 2 sprays into the nose every 4 (four) hours as needed.     Marland Kitchen SPIRONOLACTONE 50 MG PO TABS  2 tablets every am and 2 tablets at noon      BP 148/52  Pulse 67  Temp 98.4 F (36.9 C) (Oral)  Resp 12  Ht 5' (1.524 m)  Wt 191 lb 3.2 oz (86.728 kg)  BMI 37.34 kg/m2  SpO2 100%  Physical Exam  Nursing note and vitals reviewed. Constitutional: She is oriented to person, place, and time. She appears well-developed and well-nourished.  HENT:  Head: Normocephalic and atraumatic.  Mouth/Throat: Oropharynx is clear and moist.  Eyes: Conjunctivae and EOM are normal. Pupils are equal, round, and  reactive to light. No scleral icterus.  Neck: Normal range of motion. Neck supple.  Cardiovascular: Normal rate and regular rhythm.   Pulmonary/Chest: Effort normal and breath sounds normal.  Abdominal: Soft. Bowel sounds are normal. There is no tenderness.  Musculoskeletal: She exhibits tenderness.       Right shoulder: She exhibits decreased range of motion, tenderness and bony tenderness. She exhibits no swelling, no effusion and no crepitus.       Patient has decreased ROM with passive and active movement of right arm and right shoulder.  Patient has pain with internal and external rotation.Patient has tenderness to palpation of the right shoulder  and forearm.  Neurological: She is alert and oriented to person, place, and time. No cranial nerve deficit. She exhibits normal muscle tone. Coordination normal.       Cranial nerves II-XII intact, no deficits in sensation. Romberg negative. No pronator drift.  Skin: Skin is warm and dry.    ED Course  Procedures (including critical care time)  Labs Reviewed - No data to display Dg Shoulder Right  04/09/2012  *RADIOLOGY REPORT*  Clinical Data: Right shoulder pain, no injury  RIGHT SHOULDER - 2+ VIEW  Comparison: None.  Findings: No fracture or dislocation of the right shoulder.  There is joint space narrowing at acromioclavicular joint.  IMPRESSION: No fracture or dislocation.  No significant arthropathy evident.   Original Report Authenticated By: Genevive Bi, M.D.      1. Shoulder pain, right       MDM  Patient presented with right arm, shoulder, and forarm pain that was sudden in onset this morning. Patient states that she has had limited ROM for the majority of the day. Patient given pain medication in ED with improvement. Xray of the right shoulder: unremarkable. Patient discharged on pain medication with referral to orthopedics Dr. Dion Saucier. Return precautions given verbally and in discharge summary. No red flags for septic  arthritis.        Pixie Casino, PA-C 04/09/12 1908

## 2012-04-09 NOTE — ED Notes (Signed)
Patient transported to X-ray 

## 2012-04-09 NOTE — ED Provider Notes (Signed)
Medical screening examination/treatment/procedure(s) were performed by non-physician practitioner and as supervising physician I was immediately available for consultation/collaboration.  Ethelda Chick, MD 04/09/12 2041

## 2012-04-15 ENCOUNTER — Encounter: Payer: Self-pay | Admitting: Internal Medicine

## 2012-04-15 ENCOUNTER — Ambulatory Visit (INDEPENDENT_AMBULATORY_CARE_PROVIDER_SITE_OTHER): Payer: Medicare Other | Admitting: Internal Medicine

## 2012-04-15 VITALS — BP 128/80 | HR 87 | Temp 98.5°F | Ht 60.0 in | Wt 186.4 lb

## 2012-04-15 DIAGNOSIS — I10 Essential (primary) hypertension: Secondary | ICD-10-CM

## 2012-04-15 DIAGNOSIS — N189 Chronic kidney disease, unspecified: Secondary | ICD-10-CM

## 2012-04-15 DIAGNOSIS — E119 Type 2 diabetes mellitus without complications: Secondary | ICD-10-CM

## 2012-04-15 NOTE — Progress Notes (Signed)
Subjective:     Patient ID: Allison Rodgers, female   DOB: 07/01/1924   MRN: [id number]  Brief patient profile:  [phone] with morbid obesity complicated by DM, HTN and Dyslipidemia and chronic venous insufficiency with dependent edema.     August 28, 2010 cpx getting all better. limited by legs/hip/ knees no sob or increase leg swelling. >>labs done w/no rx changes   September 11, 2010 --Pt here for follow and med calendar. Last ov with CPX. Labs were w/no significant changes. A1C at 6.7 , s.CR holding at 1.4 . We reviewed all her labs in detail.Urine did show microscopic hematuria . Discussed a DM and low cholestrol diet. She is recovering from a prolonged bronchitis-feeling better but not back to baseline. She has clear drainge and energy level is low. We reviewed all her meds and organized her med calendar.  rec follow calendar   05/10/2011. Follow up and med review.  Seen 2 weeks ago for follow up . Labs showed BS elevated at 300 nonfasting.  Disucssed diet and exercise. Weight is going down with dietary changes.  Will check A1C today to evaluate average control.   We reviewed all her meds today and updated her med calendar w/ pt education  Appears she is taking her meds correctly.   Complains that she has seen small specks of red blood on tissues with wiping after bowel movement   Last colonoscopy was 2009.  No abdominal pain or n/v/d.  rec Follow med calendar closely and bring to each visit.  Use Preparation H Hemorrhoid cream after bowel movement  Low sweet diet.   Flu shot today   07/26/2011 Wert/ ov cc Pt c/o increased edema "from the hips down" x 2 months - taking an extra furosemide as per calendar "maybe once a week" with no benefit (calendar clearly says take one extra daily if needed for swelling).  Denies orthopnea. Walked [place] to office on day of ov s doe. Denies salt excess and takes her maintenance doses of furosemide and spironlactone as per calendar  instructions rec Change the as needed furosemide to 40 mg 2 extra daily if needed (can use up to 2 extra daily or a total of 4) Watch the salt and salty food  08/23/2011 f/u ov/Wert cc no better swelling in legs on max doses of furosemide as per calendar, new  Bifrontal L > R bandlike pressure  HA typically after lunch daily x sev weeks, ? viz Floaters not directely related to ha with no nausea  Or sinus complaints, better p tylenol and never in ams rec Please see patient coordinator before you leave today  to schedule venous dopplers> neg bilateral 08/27/11 Stop all caffeine - pay attention to the chronologic pattern of the headache and try alprazolam to see if it helps and call if worsens See [name] for arthritis > did not go     11/25/2011 f/u ov/Wert for multiple chronic medical problems cc worse arthritis esp R back and leg x one year, swelling is some better on present rx reviewed with her on med calendar. No sob, cough. Better control of leg swelling, no overt polydypsia, polyuria, viz changes rec Be sure to see Dr Darrelyn Hillock about your back and leg pain Please remember to go to the lab  department downstairs for your tests - we will call you with the results when they are available.   03/02/2012 f/u ov/Wert not using med calendar cc  c/o having slight HA  x 3 months and states "feels bad and weak all of the time". Sugars fasting running as high has 300 fasting on glimepririede 2 mg one half each am, persistent leg swelling despite furosemide and lasix. Admits getting very forgetful re details of care and trying to use the alphabetized epic list for med rec purposes.  HA is generalized, in pms, better with tylenol with no assoc viz or neuro c/os or nausea, always absent in ams >>Change the furosemide to 2 in am and at lunch with additonal 2 at supper if needed  Increase Glimperide to 2 mg each am   03/18/2012 Follow up and MMSE /Med calendar  We reviewed all her meds and organized  them into a med calendar with pt education  She has a lot of old bottles that she says she keeps and put new meds into old bottles  Advised this is not a good idea.  Says her memory is slipping. Gets anxious when she can not remember things. Has gotten lost a couple of times. Son in [place]. She is considering going to live with him or him come here.  Does not want to go to NH.  MMSE scored a 29/30 but slow responses.  Clock draw was very abnormal. With hands of clock outside clock.  We dicussed referral to neuro due to concern for possible dementia  She refuses to go to neuro wants to be treated here.  Labs were unremarkable with neg RPR , nml tsh and nml b12 She denies headache, visual/speech changes.  Does have anxiety at times. No significant depression like symptoms.  Sugars has been running 200-250 . Amaryl was increased 2 weeks ago w/ A1C at 8.4  No low sugars.     04/15/2012 f/u ov/Wert confused with instructions, meds, "son took her list back to [place]" felt worse x 2 weeks with weak and dizziness but no specific pattern (not related to meals).  Fasting bs remain in low 200s   Denies vertigo in supine position which seems to relieve her symptoms most of the time.   No correlation with eating, no nausea or ha.  Sleeping ok without nocturnal  or early am exacerbation  of respiratory  c/o's or need for noct saba. Also denies any obvious fluctuation of symptoms with weather or environmental changes or other aggravating or alleviating factors except as outlined above   ROS  The following are not active complaints unless bolded sore throat, dysphagia, dental problems, itching, sneezing,  nasal congestion or excess/ purulent secretions, ear ache,   fever, chills, sweats, unintended wt loss, pleuritic or exertional cp, hemoptysis,  orthopnea pnd or leg swelling, presyncope, palpitations, heartburn, abdominal pain, anorexia, nausea, vomiting, diarrhea  or change in bowel or urinary  habits, change in stools or urine, dysuria,hematuria,  rash, arthralgias, visual complaints, headache, numbness weakness or ataxia or problems with walking or coordination,  change in mood/affect or memory.                    Allergies    1) Pcn  2) Bactrim      Family History:   asthma in her brother who also has hypertension  mother died of stroke, also had diabetes and arthritis  father had multiple myeloma  4 siblings all in relatively good health  Neg for dementia     Social History:  Patient never smoked.  positive for second-hand smoke exposure  exercises 2-3 times a week  1 cup caffeine a day  married  1 child  Risk Factors:  Smoking Status: never     Past Medical History:  GERD (ICD-530.81)  ABDOMINAL PAIN, CHRONIC (ICD-789.00)  FATIGUE, CHRONIC (ICD-780.79)  DEGENERATIVE JOINT DISEASE (ICD-715.90)  - L5 radiculopathy confirmed by mri 08/2005  --refer to ortho August 24, 2008 >>> Dr Darrelyn Hillock  HYPERTENSIVE CARDIOVASCULAR DISEASE (ICD-402.90)  OBESITY (ICD-278.00)  - Target wt = 158 for BMI < 30  - Referred [facility] 04/2005  GASTRITIS, CHRONIC (ICD-535.10)  BENIGN NEOPLASM OTH&UNSPEC SITE DIGESTIVE SYSTEM (ICD-211.9)  ESOPHAGEAL STRICTURE (ICD-530.3)..............................................Marland KitchenPatterson  - EGD 12/14/07  HIATAL HERNIA (ICD-553.3)  COLONIC POLYPS, ADENOMATOUS (ICD-211.3)  - Colonoscopy 12/14/07  DIVERTICULOSIS, COLON (ICD-562.10)  DYSLIPIDEMIA (ICD-272.4)  ARTHRITIS (ICD-716.90)  DM (ICD-250.00)  HYPERTENSION (ICD-401.9)  HEALTH MAINTENANCE  - dt 03/2003  - Pneumovax 03/2003  - CPX August 28, 2010  -Mammogram 10/2009, ? abn asymmetry >>repeat in 6 months  MICROCYTIC ANEMA  - DX 02/03/09, Stool g neg x 6, rx Fe 65 mg /day > improving March 17, 2009  Edema--08/10/08--venous doppler neg for dvt, ?ruptured bakers cyst-right  March 20, 2009 repeat venous doppler > neg bilaterally  May 21, 2010 repeat venous dopplers >> neg bilat   Complex med regimen>>Meds reviewed with pt education and computerized med calendar September 11, 2010 , 05/10/2011 , 03/18/2012  Memory loss  MMSE 03/18/2012 > 29/30  Clock Draw abn                Objective:   Physical Exam obese ambulatory bf nad appears depressed" wt 213 10/21/08 > 204 April 26, 2009 > 08/23/2011  191> 11/25/2011  201 > 03/02/2012  193 > 198 03/18/2012 > 186 04/15/2012  HEENT: nl dentition, turbinates, and orophanx. Nl external ear canals without cough reflex  Neck without JVD/Nodes/TM  Lungs clear to A and P bilaterally without cough on insp or exp maneuvers  RRR no s3 or murmur or increase in P2, trace edema only sym bilaterally  Abd soft and benign with nl excursion in the supine position. No bruits or organomegaly  Rectal exam: nml sphincter tone, neg occult stool , 2 small external hemorrhoids noted  EXT : 1+ edema/ venous insufficiency changes     Assessment:        Plan:

## 2012-04-15 NOTE — Patient Instructions (Addendum)
I am very very concerned about how you are taking your medications - they may be the cause of your symptoms  See Tammy NP w/in 2 weeks with all your medications, even over the counter meds, separated in two separate bags, the ones you take no matter what along with the pill boxes you are using for these vs the ones you stop once you feel better and take only as needed when you feel you need them.   Tammy  will generate for you a new user friendly medication calendar that will put [place] all on the same page re: your medication use.     Without this process, it simply isn't possible to assure that we are providing  your outpatient care  with  the attention to detail we feel you deserve.   If we cannot assure that you're getting that kind of care,  then we cannot manage your problem effectively from this clinic.  Once you have seen Tammy and we are sure that we're all on the same page with your medication use she will arrange follow up with me.   She will be referring you to a neurologist and perhaps also a diabetes specialist after we first make sure you are taking your medications correctly.

## 2012-04-16 NOTE — Assessment & Plan Note (Signed)
Lab Results  Component Value Date   CREATININE 1.4* 03/02/2012   CREATININE 1.5* 11/25/2011   CREATININE 1.3* 08/23/2011    Adequate control on present rx, reviewed

## 2012-04-16 NOTE — Assessment & Plan Note (Signed)
Lab Results  Component Value Date   HGBA1C 8.2* 03/02/2012    Probably not Adequate control on present rx, reviewed options, major concern is hypoglycemia so leave well enough alone in the absence of symptoms

## 2012-04-16 NOTE — Assessment & Plan Note (Signed)
Adequate control on present rx, but so disorganized with meds it's possible she's double dosing on days when she's symptomatic.  I had an extended discussion with the patient today lasting 15 to 20 minutes of a 25 minute visit on the following issues:   I strongly feel she needs more help at home with meds/ needs neuro eval but she declines.  If she and her son can't return and do meaningful accurate medication reconciliation w/in 30 days I will consider withdrawing from her care.

## 2012-04-29 ENCOUNTER — Encounter: Payer: Self-pay | Admitting: Adult Health

## 2012-04-29 ENCOUNTER — Ambulatory Visit (INDEPENDENT_AMBULATORY_CARE_PROVIDER_SITE_OTHER): Payer: Medicare Other | Admitting: Adult Health

## 2012-04-29 VITALS — BP 130/60 | HR 82 | Temp 96.8°F | Ht 60.0 in | Wt 187.6 lb

## 2012-04-29 DIAGNOSIS — R413 Other amnesia: Secondary | ICD-10-CM

## 2012-04-29 DIAGNOSIS — E119 Type 2 diabetes mellitus without complications: Secondary | ICD-10-CM

## 2012-04-29 DIAGNOSIS — E785 Hyperlipidemia, unspecified: Secondary | ICD-10-CM

## 2012-04-29 NOTE — Patient Instructions (Addendum)
Follow med calendar closely and bring to each visit.  Follow up 6 weeks and As needed   Flu shot today  Please contact office for sooner follow up if symptoms do not improve or worsen or seek emergency care

## 2012-04-30 NOTE — Assessment & Plan Note (Signed)
Diet and weight loss discussed  Patient's medications were reviewed today and patient education was given. Computerized medication calendar was adjusted/completed Will recheck levels on return in 6 weeks.

## 2012-04-30 NOTE — Progress Notes (Signed)
Subjective:     Patient ID: Allison Rodgers, female   DOB: 30-Jan-1924   MRN: [id number]  Brief patient profile:  [phone] with morbid obesity complicated by DM, HTN and Dyslipidemia and chronic venous insufficiency with dependent edema.    August 28, 2010 cpx getting all better. limited by legs/hip/ knees no sob or increase leg swelling. >>labs done w/no rx changes   September 11, 2010 --Pt here for follow and med calendar. Last ov with CPX. Labs were w/no significant changes. A1C at 6.7 , s.CR holding at 1.4 . We reviewed all her labs in detail.Urine did show microscopic hematuria . Discussed a DM and low cholestrol diet. She is recovering from a prolonged bronchitis-feeling better but not back to baseline. She has clear drainge and energy level is low. We reviewed all her meds and organized her med calendar.  rec follow calendar   05/10/2011. Follow up and med review.  Seen 2 weeks ago for follow up . Labs showed BS elevated at 300 nonfasting.  Disucssed diet and exercise. Weight is going down with dietary changes.  Will check A1C today to evaluate average control.   We reviewed all her meds today and updated her med calendar w/ pt education  Appears she is taking her meds correctly.   Complains that she has seen small specks of red blood on tissues with wiping after bowel movement   Last colonoscopy was 2009.  No abdominal pain or n/v/d.  rec Follow med calendar closely and bring to each visit.  Use Preparation H Hemorrhoid cream after bowel movement  Low sweet diet.   Flu shot today   07/26/2011 Wert/ ov cc Pt c/o increased edema "from the hips down" x 2 months - taking an extra furosemide as per calendar "maybe once a week" with no benefit (calendar clearly says take one extra daily if needed for swelling).  Denies orthopnea. Walked up [place] to office on day of ov s doe. Denies salt excess and takes her maintenance doses of furosemide and spironlactone as per calendar  instructions rec Change the as needed furosemide to 40 mg 2 extra daily if needed (can use up to 2 extra daily or a total of 4) Watch the salt and salty food  08/23/2011 f/u ov/Wert cc no better swelling in legs on max doses of furosemide as per calendar, new  Bifrontal L > R bandlike pressure  HA typically after lunch daily x sev weeks, ? viz Floaters not directely related to ha with no nausea  Or sinus complaints, better p tylenol and never in ams rec Please see patient coordinator before you leave today  to schedule venous dopplers> neg bilateral 08/27/11 Stop all caffeine - pay attention to the chronologic pattern of the headache and try alprazolam to see if it helps and call if worsens See [name] for arthritis > did not go     11/25/2011 f/u ov/Wert for multiple chronic medical problems cc worse arthritis esp R back and leg x one year, swelling is some better on present rx reviewed with her on med calendar. No sob, cough. Better control of leg swelling, no overt polydypsia, polyuria, viz changes rec Be sure to see Dr Darrelyn Hillock about your back and leg pain Please remember to go to the lab  department downstairs for your tests - we will call you with the results when they are available.   03/02/2012 f/u ov/Wert not using med calendar cc  c/o having slight HA x  3 months and states "feels bad and weak all of the time". Sugars fasting running as high has 300 fasting on glimepririede 2 mg one half each am, persistent leg swelling despite furosemide and lasix. Admits getting very forgetful re details of care and trying to use the alphabetized epic list for med rec purposes.  HA is generalized, in pms, better with tylenol with no assoc viz or neuro c/os or nausea, always absent in ams >>Change the furosemide to 2 in am and at lunch with additonal 2 at supper if needed  Increase Glimperide to 2 mg each am   03/18/2012 Follow up and MMSE /Med calendar  We reviewed all her meds and organized  them into a med calendar with pt education  She has a lot of old bottles that she says she keeps and put new meds into old bottles  Advised this is not a good idea.  Says her memory is slipping. Gets anxious when she can not remember things. Has gotten lost a couple of times. Son in [place]. She is considering going to live with him or him come here.  Does not want to go to NH.  MMSE scored a 29/30 but slow responses.  Clock draw was very abnormal. With hands of clock outside clock.  We dicussed referral to neuro due to concern for possible dementia  She refuses to go to neuro wants to be treated here.  Labs were unremarkable with neg RPR , nml tsh and nml b12 She denies headache, visual/speech changes.  Does have anxiety at times. No significant depression like symptoms.  Sugars has been running 200-250 . Amaryl was increased 2 weeks ago w/ A1C at 8.4  No low sugars.    04/15/2012 f/u ov/Wert confused with instructions, meds, "son took her list back to [place]" felt worse x 2 weeks with weak and dizziness but no specific pattern (not related to meals).  Fasting bs remain in low 200s  >med review   04/29/12 Follow up and med review     [name] Her last visit. Patient was having difficulty remembering her medications and had misplaced her medication calendar. She is brought all of her medications in today. We reviewed all her medications updated. Her medication calendar with patient education. It does appear that all her medications are up-to-date and have been refilled on time, according to the medication bottles She does easily get confused if you cannot slow down and go over the medications and with slowly We discussed the importance of keeping her list up to date and keep it with her at all times especially when she is seen at the doctor's office Blood sugars are slowly decreasing with her medication adjustments recently She has  had no low sugars She denies any chest pain, shortness of breath, abdominal pain, or increased edema We discussed once again about her memory and referral to neurology. However, she declined Referral        Allergies    1) Pcn  2) Bactrim      Family History:   asthma in her brother who also has hypertension  mother died of stroke, also had diabetes and arthritis  father had multiple myeloma  4 siblings all in relatively good health  Neg for dementia     Social History:  Patient never smoked.  positive for second-hand smoke exposure  exercises 2-3 times a week  1 cup caffeine a day  married  1 child  Risk Factors:  Smoking Status: never     Past Medical History:  GERD (ICD-530.81)  ABDOMINAL PAIN, CHRONIC (ICD-789.00)  FATIGUE, CHRONIC (ICD-780.79)  DEGENERATIVE JOINT DISEASE (ICD-715.90)  - L5 radiculopathy confirmed by mri 08/2005  --refer to ortho August 24, 2008 >>> Dr Darrelyn Hillock  HYPERTENSIVE CARDIOVASCULAR DISEASE (ICD-402.90)  OBESITY (ICD-278.00)  - Target wt = 158 for BMI < 30  - Referred [facility] 04/2005  GASTRITIS, CHRONIC (ICD-535.10)  BENIGN NEOPLASM OTH&UNSPEC SITE DIGESTIVE SYSTEM (ICD-211.9)  ESOPHAGEAL STRICTURE (ICD-530.3)..............................................Marland KitchenPatterson  - EGD 12/14/07  HIATAL HERNIA (ICD-553.3)  COLONIC POLYPS, ADENOMATOUS (ICD-211.3)  - Colonoscopy 12/14/07  DIVERTICULOSIS, COLON (ICD-562.10)  DYSLIPIDEMIA (ICD-272.4)  ARTHRITIS (ICD-716.90)  DM (ICD-250.00)  HYPERTENSION (ICD-401.9)  HEALTH MAINTENANCE  - dt 03/2003  - Pneumovax 03/2003  - CPX August 28, 2010  -Mammogram 10/2009, ? abn asymmetry >>repeat ok, last mammo 4/13 >neg  MICROCYTIC ANEMA  - DX 02/03/09, Stool g neg x 6, rx Fe 65 mg /day > improving March 17, 2009  Edema--08/10/08--venous doppler neg for dvt, ?ruptured bakers cyst-right  March 20, 2009 repeat venous doppler > neg bilaterally  May 21, 2010 repeat venous dopplers >> neg bilat  Complex  med regimen>>Meds reviewed with pt education and computerized med calendar September 11, 2010 , 05/10/2011 , 03/18/2012 . 04/29/12 Memory loss  MMSE 03/18/2012 > 29/30 , Clock Draw abn , neg RPR , nml b12         ROS:  Constitutional:   No  weight loss, night sweats,  Fevers, chills,  +fatigue, or  lassitude.  HEENT:   No headaches,  Difficulty swallowing,  Tooth/dental problems, or  Sore throat,                No sneezing, itching, ear ache, nasal congestion, post nasal drip,   CV:  No chest pain,  Orthopnea, PND, swelling in lower extremities, anasarca, dizziness, palpitations, syncope.   GI  No heartburn, indigestion, abdominal pain, nausea, vomiting, diarrhea, change in bowel habits, loss of appetite, bloody stools.   Resp:   No excess mucus, no productive cough,  No non-productive cough,  No coughing up of blood.  No change in color of mucus.  No wheezing.  No chest wall deformity  Skin: no rash or lesions.  GU: no dysuria, change in color of urine, no urgency or frequency.  No flank pain, no hematuria   MS:  No joint swelling.    Psych:  No change in mood or affect. No depression or anxiety.             Objective:   Physical Exam obese ambulatory bf nad appears depressed" wt 213 10/21/08 > 204 April 26, 2009 > 08/23/2011  191> 11/25/2011  201 > 03/02/2012  193 > 198 03/18/2012 > 186 04/15/2012  HEENT: nl dentition, turbinates, and orophanx. Nl external ear canals without cough reflex  Neck without JVD/Nodes/TM  Lungs clear to A and P bilaterally without cough on insp or exp maneuvers  RRR no s3 or murmur or increase in P2,tr -1+ sym bilaterally  Abd soft and benign with nl excursion in the supine position. No bruits or organomegaly   EXT/Skin  :  venous insufficiency changes , no rash     Assessment:        Plan:

## 2012-04-30 NOTE — Assessment & Plan Note (Signed)
Near goal on diet alone Repeat on return if fasting

## 2012-04-30 NOTE — Assessment & Plan Note (Signed)
Cont on aricept  Labs unremarkable for rpr or b12 or tsh  Declined neuro referral  Cont to monitor closely

## 2012-05-04 NOTE — Addendum Note (Signed)
Addended by: Boone Master E on: 05/04/2012 12:56 PM   Modules accepted: Orders

## 2012-05-05 ENCOUNTER — Other Ambulatory Visit: Payer: Self-pay | Admitting: Adult Health

## 2012-05-06 ENCOUNTER — Other Ambulatory Visit: Payer: Self-pay | Admitting: *Deleted

## 2012-05-12 ENCOUNTER — Ambulatory Visit: Payer: Medicare Other | Admitting: Internal Medicine

## 2012-05-28 ENCOUNTER — Encounter: Payer: Self-pay | Admitting: Adult Health

## 2012-05-28 ENCOUNTER — Ambulatory Visit (INDEPENDENT_AMBULATORY_CARE_PROVIDER_SITE_OTHER)
Admission: RE | Admit: 2012-05-28 | Discharge: 2012-05-28 | Disposition: A | Discharge status: Home / Self Care | Payer: Medicare Other | Source: Ambulatory Visit | Attending: Adult Health | Admitting: Adult Health

## 2012-05-28 ENCOUNTER — Ambulatory Visit (INDEPENDENT_AMBULATORY_CARE_PROVIDER_SITE_OTHER): Payer: Medicare Other | Admitting: Adult Health

## 2012-05-28 VITALS — BP 124/62 | HR 80 | Temp 98.2°F | Ht 60.0 in | Wt 191.4 lb

## 2012-05-28 DIAGNOSIS — M1990 Unspecified osteoarthritis, unspecified site: Secondary | ICD-10-CM

## 2012-05-28 DIAGNOSIS — Z23 Encounter for immunization: Secondary | ICD-10-CM

## 2012-05-28 NOTE — Assessment & Plan Note (Addendum)
Right lumbar and hip pain s/p fall 10/14  Check xray to r/o fx   Plan Alternate ice and heat to back and hip  May use Tylenol arthritis for pain As needed   IF severe pain may use Roxicet As needed  -may cause you to be sleepy and off balance.  I will call with xray results.  Please contact office for sooner follow up if symptoms do not improve or worsen or seek emergency care  follow up as planned with Dr. [name]  And As needed   Flu shot today

## 2012-05-28 NOTE — Progress Notes (Signed)
Subjective:     Patient ID: Allison Rodgers, female   DOB: 05-Jun-1924   MRN: [id number]  Brief patient profile:  [phone] with morbid obesity complicated by DM, HTN and Dyslipidemia and chronic venous insufficiency with dependent edema.    August 28, 2010 cpx getting all better. limited by legs/hip/ knees no sob or increase leg swelling. >>labs done w/no rx changes   September 11, 2010 --Pt here for follow and med calendar. Last ov with CPX. Labs were w/no significant changes. A1C at 6.7 , s.CR holding at 1.4 . We reviewed all her labs in detail.Urine did show microscopic hematuria . Discussed a DM and low cholestrol diet. She is recovering from a prolonged bronchitis-feeling better but not back to baseline. She has clear drainge and energy level is low. We reviewed all her meds and organized her med calendar.  rec follow calendar   05/10/2011. Follow up and med review.  Seen 2 weeks ago for follow up . Labs showed BS elevated at 300 nonfasting.  Disucssed diet and exercise. Weight is going down with dietary changes.  Will check A1C today to evaluate average control.   We reviewed all her meds today and updated her med calendar w/ pt education  Appears she is taking her meds correctly.   Complains that she has seen small specks of red blood on tissues with wiping after bowel movement   Last colonoscopy was 2009.  No abdominal pain or n/v/d.  rec Follow med calendar closely and bring to each visit.  Use Preparation H Hemorrhoid cream after bowel movement  Low sweet diet.   Flu shot today   07/26/2011 Wert/ ov cc Pt c/o increased edema "from the hips down" x 2 months - taking an extra furosemide as per calendar "maybe once a week" with no benefit (calendar clearly says take one extra daily if needed for swelling).  Denies orthopnea. [place]ed [place] to office on day of ov s doe. Denies salt excess and takes her maintenance doses of furosemide and spironlactone as per calendar  instructions rec Change the as needed furosemide to 40 mg 2 extra daily if needed (can use up to 2 extra daily or a total of 4) Watch the salt and salty food  08/23/2011 f/u ov/Wert cc no better swelling in legs on max doses of furosemide as per calendar, new  Bifrontal L > R bandlike pressure  HA typically after lunch daily x sev weeks, ? viz Floaters not directely related to ha with no nausea  Or sinus complaints, better p tylenol and never in ams rec Please see patient coordinator before you leave today  to schedule venous dopplers> neg bilateral 08/27/11 Stop all caffeine - pay attention to the chronologic pattern of the headache and try alprazolam to see if it helps and call if worsens See [name] for arthritis > did not go     11/25/2011 f/u ov/Wert for multiple chronic medical problems cc worse arthritis esp R back and leg x one year, swelling is some better on present rx reviewed with her on med calendar. No sob, cough. Better control of leg swelling, no overt polydypsia, polyuria, viz changes rec Be sure to see Dr Darrelyn Hillock about your back and leg pain Please remember to go to the lab  department downstairs for your tests - we will call you with the results when they are available.   03/02/2012 f/u ov/Wert not using med calendar cc  c/o having slight HA x  3 months and states "feels bad and weak all of the time". Sugars fasting running as high has 300 fasting on glimepririede 2 mg one half each am, persistent leg swelling despite furosemide and lasix. Admits getting very forgetful re details of care and trying to use the alphabetized epic list for med rec purposes.  HA is generalized, in pms, better with tylenol with no assoc viz or neuro c/os or nausea, always absent in ams >>Change the furosemide to 2 in am and at lunch with additonal 2 at supper if needed  Increase Glimperide to 2 mg each am   03/18/2012 Follow up and MMSE /Med calendar  We reviewed all her meds and organized  them into a med calendar with pt education  She has a lot of old bottles that she says she keeps and put new meds into old bottles  Advised this is not a good idea.  Says her memory is slipping. Gets anxious when she can not remember things. Has gotten lost a couple of times. Son in [place]. She is considering going to live with him or him come here.  Does not want to go to NH.  MMSE scored a 29/30 but slow responses.  Clock draw was very abnormal. With hands of clock outside clock.  We dicussed referral to neuro due to concern for possible dementia  She refuses to go to neuro wants to be treated here.  Labs were unremarkable with neg RPR , nml tsh and nml b12 She denies headache, visual/speech changes.  Does have anxiety at times. No significant depression like symptoms.  Sugars has been running 200-250 . Amaryl was increased 2 weeks ago w/ A1C at 8.4  No low sugars.    04/15/2012 f/u ov/Wert confused with instructions, meds, "son took her list back to [place]" felt worse x 2 weeks with weak and dizziness but no specific pattern (not related to meals).  Fasting bs remain in low 200s  >med review   04/29/12 Follow up and med review     [name] Her last visit. Patient was having difficulty remembering her medications and had misplaced her medication calendar. She is brought all of her medications in today. We reviewed all her medications updated. Her medication calendar with patient education. It does appear that all her medications are up-to-date and have been refilled on time, according to the medication bottles She does easily get confused if you cannot slow down and go over the medications and with slowly We discussed the importance of keeping her list up to date and keep it with her at all times especially when she is seen at the doctor's office Blood sugars are slowly decreasing with her medication adjustments recently She has  had no low sugars She denies any chest pain, shortness of breath, abdominal pain, or increased edema We discussed once again about her memory and referral to neurology. However, she declined Referral  05/28/2012 Acute OV  Complains of right back and hip pain.  pt fell at home 05-25-12, right hip and groin area/right elbow are sore.   reports lost her balance and fell forward landing on right side.  She was able to get herself, and ambulates with minimal pain. Over the last 2 days, has been progressively worsening over and tender along the right side of her lower back into her right hip and right groin area, especially with walking. She denies any urinary symptoms, chest pain, loss of consciousness, visual  or speech changes, extremity weakness, radicular symptoms, or leg swelling        Allergies    1) Pcn  2) Bactrim      Family History:   asthma in her brother who also has hypertension  mother died of stroke, also had diabetes and arthritis  father had multiple myeloma  4 siblings all in relatively good health  Neg for dementia     Social History:  Patient never smoked.  positive for second-hand smoke exposure  exercises 2-3 times a week  1 cup caffeine a day  married  1 child  Risk Factors:  Smoking Status: never     Past Medical History:  GERD (ICD-530.81)  ABDOMINAL PAIN, CHRONIC (ICD-789.00)  FATIGUE, CHRONIC (ICD-780.79)  DEGENERATIVE JOINT DISEASE (ICD-715.90)  - L5 radiculopathy confirmed by mri 08/2005  --refer to ortho August 24, 2008 >>> Dr Darrelyn Hillock  HYPERTENSIVE CARDIOVASCULAR DISEASE (ICD-402.90)  OBESITY (ICD-278.00)  - Target wt = 158 for BMI < 30  - Referred [facility] 04/2005  GASTRITIS, CHRONIC (ICD-535.10)  BENIGN NEOPLASM OTH&UNSPEC SITE DIGESTIVE SYSTEM (ICD-211.9)  ESOPHAGEAL STRICTURE (ICD-530.3)..............................................Marland KitchenPatterson  - EGD 12/14/07  HIATAL HERNIA (ICD-553.3)  COLONIC POLYPS, ADENOMATOUS (ICD-211.3)  -  Colonoscopy 12/14/07  DIVERTICULOSIS, COLON (ICD-562.10)  DYSLIPIDEMIA (ICD-272.4)  ARTHRITIS (ICD-716.90)  DM (ICD-250.00)  HYPERTENSION (ICD-401.9)  HEALTH MAINTENANCE  - dt 03/2003  - Pneumovax 03/2003  - CPX August 28, 2010  -Mammogram 10/2009, ? abn asymmetry >>repeat ok, last mammo 4/13 >neg  MICROCYTIC ANEMA  - DX 02/03/09, Stool g neg x 6, rx Fe 65 mg /day > improving March 17, 2009  Edema--08/10/08--venous doppler neg for dvt, ?ruptured bakers cyst-right  March 20, 2009 repeat venous doppler > neg bilaterally  May 21, 2010 repeat venous dopplers >> neg bilat  Complex med regimen>>Meds reviewed with pt education and computerized med calendar September 11, 2010 , 05/10/2011 , 03/18/2012 . 04/29/12 Memory loss  MMSE 03/18/2012 > 29/30 , Clock Draw abn , neg RPR , nml b12         ROS:  Constitutional:   No  weight loss, night sweats,  Fevers, chills, fatigue, or  lassitude.  HEENT:   No headaches,  Difficulty swallowing,  Tooth/dental problems, or  Sore throat,                No sneezing, itching, ear ache, nasal congestion, post nasal drip,   CV:  No chest pain,  Orthopnea, PND, swelling in lower extremities, anasarca, dizziness, palpitations, syncope.   GI  No heartburn, indigestion, abdominal pain, nausea, vomiting, diarrhea, change in bowel habits, loss of appetite, bloody stools.   Resp:   No excess mucus, no productive cough,  No non-productive cough,  No coughing up of blood.  No change in color of mucus.  No wheezing.  No chest wall deformity  Skin: no rash or lesions.  GU: no dysuria, change in color of urine, no urgency or frequency.  No flank pain, no hematuria   MS:  No joint swelling.    Psych:  No change in mood or affect. No depression or anxiety.             Objective:   Physical Exam obese ambulatory bf nad   wt 213 10/21/08 > 204 April 26, 2009 > 08/23/2011  191> 11/25/2011  201 > 03/02/2012  193 > 198 03/18/2012 > 186 04/15/2012 >191 05/28/2012   HEENT: nl dentition, turbinates, and orophanx. Nl external ear canals without cough reflex  Neck  without JVD/Nodes/TM  Lungs clear to A and P bilaterally without cough on insp or exp maneuvers  RRR no s3 or murmur or increase in P2,tr -1+ sym bilaterally  Abd soft and benign with nl excursion in the supine position. No bruits or organomegaly   EXT/Skin  :  venous insufficiency changes , no rash  Tender along right lumbarsacral region, neg SLR , no deformity noted. Pain w/ external hip rotation.  Equal strength of lower ext.      Assessment:        Plan:

## 2012-05-28 NOTE — Addendum Note (Signed)
Addended by: Boone Master E on: 05/28/2012 03:33 PM   Modules accepted: Orders

## 2012-05-28 NOTE — Patient Instructions (Signed)
Alternate ice and heat to back and hip  May use Tylenol arthritis for pain As needed   IF severe pain may use Roxicet As needed  -may cause you to be sleepy and off balance.  I will call with xray results.  Please contact office for sooner follow up if symptoms do not improve or worsen or seek emergency care  follow up as planned with Dr. [name]  And As needed   Flu shot today

## 2012-06-03 ENCOUNTER — Other Ambulatory Visit: Payer: Self-pay | Admitting: *Deleted

## 2012-06-03 MED ORDER — INSULIN GLARGINE 100 UNIT/ML ~~LOC~~ SOLN: 25.0000 [IU] | Freq: Every day | SUBCUTANEOUS | Status: DC

## 2012-06-08 ENCOUNTER — Ambulatory Visit (INDEPENDENT_AMBULATORY_CARE_PROVIDER_SITE_OTHER): Payer: Medicare Other | Admitting: Adult Health

## 2012-06-08 ENCOUNTER — Encounter: Payer: Self-pay | Admitting: Adult Health

## 2012-06-08 VITALS — BP 130/64 | HR 97 | Temp 97.1°F | Ht 61.0 in | Wt 184.4 lb

## 2012-06-08 DIAGNOSIS — M1990 Unspecified osteoarthritis, unspecified site: Secondary | ICD-10-CM

## 2012-06-08 MED ORDER — GLIMEPIRIDE 2 MG PO TABS: 2.0000 mg | ORAL_TABLET | Freq: Every day | ORAL | Status: DC

## 2012-06-08 NOTE — Progress Notes (Signed)
Subjective:     Patient ID: Allison Rodgers, female   DOB: 05-Jun-1924   MRN: [id number]  Brief patient profile:  [phone] with morbid obesity complicated by DM, HTN and Dyslipidemia and chronic venous insufficiency with dependent edema.    August 28, 2010 cpx getting all better. limited by legs/hip/ knees no sob or increase leg swelling. >>labs done w/no rx changes   September 11, 2010 --Pt here for follow and med calendar. Last ov with CPX. Labs were w/no significant changes. A1C at 6.7 , s.CR holding at 1.4 . We reviewed all her labs in detail.Urine did show microscopic hematuria . Discussed a DM and low cholestrol diet. She is recovering from a prolonged bronchitis-feeling better but not back to baseline. She has clear drainge and energy level is low. We reviewed all her meds and organized her med calendar.  rec follow calendar   05/10/2011. Follow up and med review.  Seen 2 weeks ago for follow up . Labs showed BS elevated at 300 nonfasting.  Disucssed diet and exercise. Weight is going down with dietary changes.  Will check A1C today to evaluate average control.   We reviewed all her meds today and updated her med calendar w/ pt education  Appears she is taking her meds correctly.   Complains that she has seen small specks of red blood on tissues with wiping after bowel movement   Last colonoscopy was 2009.  No abdominal pain or n/v/d.  rec Follow med calendar closely and bring to each visit.  Use Preparation H Hemorrhoid cream after bowel movement  Low sweet diet.   Flu shot today   07/26/2011 Wert/ ov cc Pt c/o increased edema "from the hips down" x 2 months - taking an extra furosemide as per calendar "maybe once a week" with no benefit (calendar clearly says take one extra daily if needed for swelling).  Denies orthopnea. Walked up [place] to office on day of ov s doe. Denies salt excess and takes her maintenance doses of furosemide and spironlactone as per calendar  instructions rec Change the as needed furosemide to 40 mg 2 extra daily if needed (can use up to 2 extra daily or a total of 4) Watch the salt and salty food  08/23/2011 f/u ov/Wert cc no better swelling in legs on max doses of furosemide as per calendar, new  Bifrontal L > R bandlike pressure  HA typically after lunch daily x sev weeks, ? viz Floaters not directely related to ha with no nausea  Or sinus complaints, better p tylenol and never in ams rec Please see patient coordinator before you leave today  to schedule venous dopplers> neg bilateral 08/27/11 Stop all caffeine - pay attention to the chronologic pattern of the headache and try alprazolam to see if it helps and call if worsens See [name] for arthritis > did not go     11/25/2011 f/u ov/Wert for multiple chronic medical problems cc worse arthritis esp R back and leg x one year, swelling is some better on present rx reviewed with her on med calendar. No sob, cough. Better control of leg swelling, no overt polydypsia, polyuria, viz changes rec Be sure to see Dr Darrelyn Hillock about your back and leg pain Please remember to go to the lab  department downstairs for your tests - we will call you with the results when they are available.   03/02/2012 f/u ov/Wert not using med calendar cc  c/o having slight HA x  3 months and states "feels bad and weak all of the time". Sugars fasting running as high has 300 fasting on glimepririede 2 mg one half each am, persistent leg swelling despite furosemide and lasix. Admits getting very forgetful re details of care and trying to use the alphabetized epic list for med rec purposes.  HA is generalized, in pms, better with tylenol with no assoc viz or neuro c/os or nausea, always absent in ams >>Change the furosemide to 2 in am and at lunch with additonal 2 at supper if needed  Increase Glimperide to 2 mg each am   03/18/2012 Follow up and MMSE /Med calendar  We reviewed all her meds and organized  them into a med calendar with pt education  She has a lot of old bottles that she says she keeps and put new meds into old bottles  Advised this is not a good idea.  Says her memory is slipping. Gets anxious when she can not remember things. Has gotten lost a couple of times. Son in [place]. She is considering going to live with him or him come here.  Does not want to go to NH.  MMSE scored a 29/30 but slow responses.  Clock draw was very abnormal. With hands of clock outside clock.  We dicussed referral to neuro due to concern for possible dementia  She refuses to go to neuro wants to be treated here.  Labs were unremarkable with neg RPR , nml tsh and nml b12 She denies headache, visual/speech changes.  Does have anxiety at times. No significant depression like symptoms.  Sugars has been running 200-250 . Amaryl was increased 2 weeks ago w/ A1C at 8.4  No low sugars.    04/15/2012 f/u ov/Wert confused with instructions, meds, "son took her list back to [place]" felt worse x 2 weeks with weak and dizziness but no specific pattern (not related to meals).  Fasting bs remain in low 200s  >med review   04/29/12 Follow up and med review     [name] Her last visit. Patient was having difficulty remembering her medications and had misplaced her medication calendar. She is brought all of her medications in today. We reviewed all her medications updated. Her medication calendar with patient education. It does appear that all her medications are up-to-date and have been refilled on time, according to the medication bottles She does easily get confused if you cannot slow down and go over the medications and with slowly We discussed the importance of keeping her list up to date and keep it with her at all times especially when she is seen at the doctor's office Blood sugars are slowly decreasing with her medication adjustments recently She has  had no low sugars She denies any chest pain, shortness of breath, abdominal pain, or increased edema We discussed once again about her memory and referral to neurology. However, she declined Referral  05/28/2012 Acute OV  Complains of right back and hip pain.  pt fell at home 05-25-12, right hip and groin area/right elbow are sore.   reports lost her balance and fell forward landing on right side.  She was able to get herself, and ambulates with minimal pain. Over the last 2 days, has been progressively worsening over and tender along the right side of her lower back into her right hip and right groin area, especially with walking. She denies any urinary symptoms, chest pain, loss of consciousness, visual  or speech changes, extremity weakness, radicular symptoms, or leg swelling >xray w/ no acute process   06/08/2012 Follow up  Returns for follow up for hip and back pain, seen 2 weeks ago after a fall.  Xrays showed no fracture.  Feeling better but still some residual soreness.  Does have some drainage and dry cough for 2 weeks.  No fever , discolored mucus.            Allergies    1) Pcn  2) Bactrim      Family History:   asthma in her brother who also has hypertension  mother died of stroke, also had diabetes and arthritis  father had multiple myeloma  4 siblings all in relatively good health  Neg for dementia     Social History:  Patient never smoked.  positive for second-hand smoke exposure  exercises 2-3 times a week  1 cup caffeine a day  married  1 child  Risk Factors:  Smoking Status: never     Past Medical History:  GERD (ICD-530.81)  ABDOMINAL PAIN, CHRONIC (ICD-789.00)  FATIGUE, CHRONIC (ICD-780.79)  DEGENERATIVE JOINT DISEASE (ICD-715.90)  - L5 radiculopathy confirmed by mri 08/2005  --refer to ortho August 24, 2008 >>> Dr Darrelyn Hillock  HYPERTENSIVE CARDIOVASCULAR DISEASE (ICD-402.90)  OBESITY (ICD-278.00)  - Target wt = 158 for BMI < 30  - Referred  [facility] 04/2005  GASTRITIS, CHRONIC (ICD-535.10)  BENIGN NEOPLASM OTH&UNSPEC SITE DIGESTIVE SYSTEM (ICD-211.9)  ESOPHAGEAL STRICTURE (ICD-530.3)..............................................Marland KitchenPatterson  - EGD 12/14/07  HIATAL HERNIA (ICD-553.3)  COLONIC POLYPS, ADENOMATOUS (ICD-211.3)  - Colonoscopy 12/14/07  DIVERTICULOSIS, COLON (ICD-562.10)  DYSLIPIDEMIA (ICD-272.4)  ARTHRITIS (ICD-716.90)  DM (ICD-250.00)  HYPERTENSION (ICD-401.9)  HEALTH MAINTENANCE  - dt 03/2003  - Pneumovax 03/2003  - CPX August 28, 2010  -Mammogram 10/2009, ? abn asymmetry >>repeat ok, last mammo 4/13 >neg  MICROCYTIC ANEMA  - DX 02/03/09, Stool g neg x 6, rx Fe 65 mg /day > improving March 17, 2009  Edema--08/10/08--venous doppler neg for dvt, ?ruptured bakers cyst-right  March 20, 2009 repeat venous doppler > neg bilaterally  May 21, 2010 repeat venous dopplers >> neg bilat  Complex med regimen>>Meds reviewed with pt education and computerized med calendar September 11, 2010 , 05/10/2011 , 03/18/2012 . 04/29/12 Memory loss  MMSE 03/18/2012 > 29/30 , Clock Draw abn , neg RPR , nml b12         ROS:  Constitutional:   No  weight loss, night sweats,  Fevers, chills, fatigue, or  lassitude.  HEENT:   No headaches,  Difficulty swallowing,  Tooth/dental problems, or  Sore throat,                No sneezing, itching, ear ache,  +nasal congestion, post nasal drip,   CV:  No chest pain,  Orthopnea, PND, swelling in lower extremities, anasarca, dizziness, palpitations, syncope.   GI  No heartburn, indigestion, abdominal pain, nausea, vomiting, diarrhea, change in bowel habits, loss of appetite, bloody stools.   Resp:   No excess mucus, no productive cough,  No non-productive cough,  No coughing up of blood.  No change in color of mucus.  No wheezing.  No chest wall deformity  Skin: no rash or lesions.  GU: no dysuria, change in color of urine, no urgency or frequency.  No flank pain, no hematuria   MS:  No  joint swelling.    Psych:  No change in mood or affect. No depression or anxiety.  Objective:   Physical Exam obese ambulatory bf nad   wt 213 10/21/08 > 204 April 26, 2009 > 08/23/2011  191> 11/25/2011  201 > 03/02/2012  193 > 198 03/18/2012 > 186 04/15/2012 >191 05/28/2012 >184 06/08/2012  HEENT: nl dentition, turbinates, and orophanx. Nl external ear canals without cough reflex  Neck without JVD/Nodes/TM  Lungs clear to A and P bilaterally without cough on insp or exp maneuvers  RRR no s3 or murmur or increase in P2,tr -1+ sym bilaterally  Abd soft and benign with nl excursion in the supine position. No bruits or organomegaly   EXT/Skin  :  venous insufficiency changes , no rash  Decreased tenderness along right lumbarsacral region, neg SLR , no deformity noted.  Equal strength of lower ext.      Assessment:        Plan:

## 2012-06-08 NOTE — Addendum Note (Signed)
Addended by: Nita Sells on: 06/08/2012 11:50 AM   Modules accepted: Orders

## 2012-06-08 NOTE — Assessment & Plan Note (Signed)
Improved back pain s/p fall,  Xray w/ no acute process  Cont w/ conservative tx  If not resolving refer to ortho  Please contact office for sooner follow up if symptoms do not improve or worsen or seek emergency care

## 2012-06-08 NOTE — Patient Instructions (Addendum)
Alternate ice and heat to back and hip  May use Tylenol arthritis for pain As needed   IF severe pain may use Roxicet As needed  -may cause you to be sleepy and off balance.  Please contact office for sooner follow up if symptoms do not improve or worsen or seek emergency care  follow up as planned with Dr. [name]  And As needed

## 2012-06-10 ENCOUNTER — Ambulatory Visit: Payer: Medicare Other | Admitting: Internal Medicine

## 2012-06-18 ENCOUNTER — Ambulatory Visit (INDEPENDENT_AMBULATORY_CARE_PROVIDER_SITE_OTHER): Payer: Medicare Other | Admitting: Internal Medicine

## 2012-06-18 ENCOUNTER — Encounter: Payer: Self-pay | Admitting: Internal Medicine

## 2012-06-18 ENCOUNTER — Other Ambulatory Visit (INDEPENDENT_AMBULATORY_CARE_PROVIDER_SITE_OTHER): Payer: Medicare Other

## 2012-06-18 VITALS — BP 120/64 | HR 104 | Temp 98.1°F | Ht 61.0 in | Wt 186.0 lb

## 2012-06-18 DIAGNOSIS — R05 Cough: Secondary | ICD-10-CM

## 2012-06-18 DIAGNOSIS — R053 Chronic cough: Secondary | ICD-10-CM

## 2012-06-18 DIAGNOSIS — E785 Hyperlipidemia, unspecified: Secondary | ICD-10-CM

## 2012-06-18 DIAGNOSIS — I10 Essential (primary) hypertension: Secondary | ICD-10-CM

## 2012-06-18 DIAGNOSIS — R413 Other amnesia: Secondary | ICD-10-CM

## 2012-06-18 DIAGNOSIS — E119 Type 2 diabetes mellitus without complications: Secondary | ICD-10-CM

## 2012-06-18 LAB — BASIC METABOLIC PANEL
CO2: 26 mEq/L (ref 19–32)
GFR: 50.3 mL/min — ABNORMAL LOW (ref >60.00)
Glucose, Bld: 239 mg/dL — ABNORMAL HIGH (ref 70–99)
Potassium: 3.6 mEq/L (ref 3.5–5.1)
Sodium: 137 mEq/L (ref 135–145)

## 2012-06-18 MED ORDER — AZELASTINE-FLUTICASONE 137-50 MCG/ACT NA SUSP: 1.0000 | Freq: Two times a day (BID) | NASAL | Status: DC

## 2012-06-18 NOTE — Patient Instructions (Addendum)
Stop fluticasone and the aricept  Dymista one twice daily  GERD (REFLUX)  is an extremely common cause of respiratory symptoms, many times with no significant heartburn at all.    It can be treated with medication, but also with lifestyle changes including avoidance of late meals, excessive alcohol, smoking cessation, and avoid fatty foods, chocolate, peppermint, colas, red wine, and acidic juices such as orange juice.  NO MINT OR MENTHOL PRODUCTS SO NO COUGH DROPS  USE SUGARLESS CANDY INSTEAD (sugarless candy jolley ranchers or Stover's)  NO OIL BASED VITAMINS - use powdered substitutes.  Please remember to go to the lab  department downstairs for your tests - we will call you with the results when they are available.     Please schedule a follow up visit in 3 months but call sooner if needed to see Tammy

## 2012-06-18 NOTE — Progress Notes (Signed)
Quick Note:  Atc, number was d/c'ed ______

## 2012-06-18 NOTE — Progress Notes (Signed)
Subjective:     Patient ID: Allison Rodgers, female   DOB: 05-Jun-1924   MRN: [id number]  Brief patient profile:  [phone] with morbid obesity complicated by DM, HTN and Dyslipidemia and chronic venous insufficiency with dependent edema.    August 28, 2010 cpx getting all better. limited by legs/hip/ knees no sob or increase leg swelling. >>labs done w/no rx changes   September 11, 2010 --Pt here for follow and med calendar. Last ov with CPX. Labs were w/no significant changes. A1C at 6.7 , s.CR holding at 1.4 . We reviewed all her labs in detail.Urine did show microscopic hematuria . Discussed a DM and low cholestrol diet. She is recovering from a prolonged bronchitis-feeling better but not back to baseline. She has clear drainge and energy level is low. We reviewed all her meds and organized her med calendar.  rec follow calendar   05/10/2011. Follow up and med review.  Seen 2 weeks ago for follow up . Labs showed BS elevated at 300 nonfasting.  Disucssed diet and exercise. Weight is going down with dietary changes.  Will check A1C today to evaluate average control.   We reviewed all her meds today and updated her med calendar w/ pt education  Appears she is taking her meds correctly.   Complains that she has seen small specks of red blood on tissues with wiping after bowel movement   Last colonoscopy was 2009.  No abdominal pain or n/v/d.  rec Follow med calendar closely and bring to each visit.  Use Preparation H Hemorrhoid cream after bowel movement  Low sweet diet.   Flu shot today   07/26/2011 Davey Limas/ ov cc Pt c/o increased edema "from the hips down" x 2 months - taking an extra furosemide as per calendar "maybe once a week" with no benefit (calendar clearly says take one extra daily if needed for swelling).  Denies orthopnea. [place]ed [place] to office on day of ov s doe. Denies salt excess and takes her maintenance doses of furosemide and spironlactone as per calendar  instructions rec Change the as needed furosemide to 40 mg 2 extra daily if needed (can use up to 2 extra daily or a total of 4) Watch the salt and salty food  08/23/2011 f/u ov/[name] cc no better swelling in legs on max doses of furosemide as per calendar, new  Bifrontal L > R bandlike pressure  HA typically after lunch daily x sev weeks, ? viz Floaters not directely related to ha with no nausea  Or sinus complaints, better p tylenol and never in ams rec Please see patient coordinator before you leave today  to schedule venous dopplers> neg bilateral 08/27/11 Stop all caffeine - pay attention to the chronologic pattern of the headache and try alprazolam to see if it helps and call if worsens See [name] for arthritis > did not go     11/25/2011 f/u ov/[name] for multiple chronic medical problems cc worse arthritis esp R back and leg x one year, swelling is some better on present rx reviewed with her on med calendar. No sob, cough. Better control of leg swelling, no overt polydypsia, polyuria, viz changes rec Be sure to see Dr Darrelyn Hillock about your back and leg pain Please remember to go to the lab  department downstairs for your tests - we will call you with the results when they are available.   03/02/2012 f/u ov/[name] not using med calendar cc  c/o having slight HA x  3 months and states "feels bad and weak all of the time". Sugars fasting running as high has 300 fasting on glimepririede 2 mg one half each am, persistent leg swelling despite furosemide and lasix. Admits getting very forgetful re details of care and trying to use the alphabetized epic list for med rec purposes.  HA is generalized, in pms, better with tylenol with no assoc viz or neuro c/os or nausea, always absent in ams >>Change the furosemide to 2 in am and at lunch with additonal 2 at supper if needed  Increase Glimperide to 2 mg each am   03/18/2012 Follow up and MMSE /Med calendar  We reviewed all her meds and organized  them into a med calendar with pt education  She has a lot of old bottles that she says she keeps and put new meds into old bottles  Advised this is not a good idea.  Says her memory is slipping. Gets anxious when she can not remember things. Has gotten lost a couple of times. Son in [place]. She is considering going to live with him or him come here.  Does not want to go to NH.  MMSE scored a 29/30 but slow responses.  Clock draw was very abnormal. With hands of clock outside clock.  We dicussed referral to neuro due to concern for possible dementia  She refuses to go to neuro wants to be treated here.  Labs were unremarkable with neg RPR , nml tsh and nml b12 She denies headache, visual/speech changes.  Does have anxiety at times. No significant depression like symptoms.  Sugars has been running 200-250 . Amaryl was increased 2 weeks ago w/ A1C at 8.4  No low sugars.    04/15/2012 f/u ov/[name] confused with instructions, meds, "son took her list back to [place]" felt worse x 2 weeks with weak and dizziness but no specific pattern (not related to meals).  Fasting bs remain in low 200s  >med review   04/29/12 Follow up and med review     [name] Her last visit. Patient was having difficulty remembering her medications and had misplaced her medication calendar. She is brought all of her medications in today. We reviewed all her medications updated. Her medication calendar with patient education. It does appear that all her medications are up-to-date and have been refilled on time, according to the medication bottles She does easily get confused if you cannot slow down and go over the medications and with slowly We discussed the importance of keeping her list up to date and keep it with her at all times especially when she is seen at the doctor's office Blood sugars are slowly decreasing with her medication adjustments recently She has  had no low sugars She denies any chest pain, shortness of breath, abdominal pain, or increased edema We discussed once again about her memory and referral to neurology. However, she declined Referral  05/28/2012 Acute OV  Complains of right back and hip pain.  pt fell at home 05-25-12, right hip and groin area/right elbow are sore.   reports lost her balance and fell forward landing on right side.  She was able to get herself, and ambulates with minimal pain. Over the last 2 days, has been progressively worsening over and tender along the right side of her lower back into her right hip and right groin area, especially with walking. She denies any urinary symptoms, chest pain, loss of consciousness, visual  or speech changes, extremity weakness, radicular symptoms, or leg swelling >xray w/ no acute process   06/08/2012 Follow up  Returns for follow up for hip and back pain, seen 2 weeks ago after a fall.  Xrays showed no fracture.  Feeling better but still some residual soreness.  Does have some drainage and dry cough for 2 weeks.  rec Alternate ice and heat to back and hip  May use Tylenol arthritis for pain As needed   IF severe pain may use Roxicet As needed  -may cause you to be sleepy and off balance.   06/18/2012 f/u ov/Lunetta Marina cc  Hips/ back better, no need for narcotic rx.  Memory no better on aricept and pt wants to stop.  Seems to using med calendar well.  Main c/o is afteroon cough daily x years assoc with sense of pnds not responding to zyrtec and taking ppi qd ac as well as flonase. No excess mucus. No sob / wheeze.  Sleeping ok without nocturnal  or early am exacerbation  of respiratory  c/o's or need for noct saba. Also denies any obvious fluctuation of symptoms with weather or environmental changes or other aggravating or alleviating factors except as outlined above  ROS  The following are not active complaints unless bolded sore throat, dysphagia, dental problems, itching,  sneezing,  nasal congestion or excess/ purulent secretions, ear ache,   fever, chills, sweats, unintended wt loss, pleuritic or exertional cp, hemoptysis,  orthopnea pnd or leg swelling, presyncope, palpitations, heartburn, abdominal pain, anorexia, nausea, vomiting, diarrhea  or change in bowel or urinary habits, change in stools or urine, dysuria,hematuria,  rash, arthralgias, visual complaints, headache, numbness weakness or ataxia or problems with walking or coordination,  change in mood/affect or memory.              Allergies    1) Pcn  2) Bactrim      Family History:   asthma in her brother who also has hypertension  mother died of stroke, also had diabetes and arthritis  father had multiple myeloma  4 siblings all in relatively good health  Neg for dementia     Social History:  Patient never smoked.  positive for second-hand smoke exposure  exercises 2-3 times a week  1 cup caffeine a day  married  1 child  Risk Factors:  Smoking Status: never     Past Medical History:  GERD (ICD-530.81)  ABDOMINAL PAIN, CHRONIC (ICD-789.00)  FATIGUE, CHRONIC (ICD-780.79)  DEGENERATIVE JOINT DISEASE (ICD-715.90)  - L5 radiculopathy confirmed by mri 08/2005  --refer to ortho August 24, 2008 >>> Dr Darrelyn Hillock  HYPERTENSIVE CARDIOVASCULAR DISEASE (ICD-402.90)  OBESITY (ICD-278.00)  - Target wt = 158 for BMI < 30  - Referred [facility] 04/2005  GASTRITIS, CHRONIC (ICD-535.10)  BENIGN NEOPLASM OTH&UNSPEC SITE DIGESTIVE SYSTEM (ICD-211.9)  ESOPHAGEAL STRICTURE (ICD-530.3)..............................................Marland KitchenPatterson  - EGD 12/14/07  HIATAL HERNIA (ICD-553.3)  COLONIC POLYPS, ADENOMATOUS (ICD-211.3)  - Colonoscopy 12/14/07  DIVERTICULOSIS, COLON (ICD-562.10)  DYSLIPIDEMIA (ICD-272.4)  ARTHRITIS (ICD-716.90)  DM (ICD-250.00)  HYPERTENSION (ICD-401.9)  HEALTH MAINTENANCE  - dt 03/2003  - Pneumovax 03/2003  - CPX August 28, 2010  -Mammogram 10/2009, ? abn asymmetry  >>repeat ok, last mammo 4/13 >neg  MICROCYTIC ANEMA  - DX 02/03/09, Stool g neg x 6, rx Fe 65 mg /day > improving March 17, 2009  Edema--08/10/08--venous doppler neg for dvt, ?ruptured bakers cyst-right  March 20, 2009 repeat venous doppler > neg bilaterally  May 21, 2010 repeat venous dopplers >>  neg bilat  Complex med regimen>>Meds reviewed with pt education and computerized med calendar September 11, 2010 , 05/10/2011 , 03/18/2012 . 04/29/12 Memory loss  MMSE 03/18/2012 > 29/30 , Clock Draw abn , neg RPR , nml b12                     Objective:   Physical Exam obese ambulatory bf nad  Can no longer get on exam table ever with assistance wt 213 10/21/08 > 204 April 26, 2009 > 08/23/2011  191> 11/25/2011  201 > 03/02/2012  193 > 198 03/18/2012 > 186 04/15/2012 >191 05/28/2012 >184 06/08/2012 > 06/18/2012 186 HEENT: nl dentition, turbinates, and orophanx. Nl external ear canals without cough reflex  Neck without JVD/Nodes/TM  Lungs clear to A and P bilaterally without cough on insp or exp maneuvers  RRR no s3 or murmur or increase in P2,tr -1+ sym bilaterally  Abd soft and benign with nl excursion in the supine position. No bruits or organomegaly   EXT/Skin  :  venous insufficiency changes , no rash  Neuro:  No focal motor deficits     Assessment:        Plan:

## 2012-06-19 DIAGNOSIS — R05 Cough: Secondary | ICD-10-CM | POA: Insufficient documentation

## 2012-06-19 NOTE — Assessment & Plan Note (Signed)
-   first reported 02/2012 >03/18/12  MMSE 29/30, abnml clock draw , rpr neg, nml b12/tsh rec >>>aricept 5mg  > pt request d/c 06/19/2012

## 2012-06-19 NOTE — Assessment & Plan Note (Signed)
Lab Results  Component Value Date   CREATININE 1.3* 06/18/2012   CREATININE 1.4* 03/02/2012   CREATININE 1.5* 11/25/2011    Adequate control on present rx, reviewed

## 2012-06-19 NOTE — Assessment & Plan Note (Signed)
Lab Results  Component Value Date   CHOL 178 11/25/2011   HDL 55.50 11/25/2011   LDLCALC 90 11/25/2011   TRIG 165.0* 11/25/2011   CHOLHDL 3 11/25/2011    Adequate control on present rx, reviewed

## 2012-06-19 NOTE — Assessment & Plan Note (Signed)
Adequate control on present rx, reviewed with previous hgba1c 8.2 Lab Results  Component Value Date   HGBA1C 7.5* 06/18/2012

## 2012-06-19 NOTE — Assessment & Plan Note (Signed)
The most common causes of chronic cough in immunocompetent adults include the following: upper airway cough syndrome (UACS), previously referred to as postnasal drip syndrome (PNDS), which is caused by variety of rhinosinus conditions; (2) asthma; (3) GERD; (4) chronic bronchitis from cigarette smoking or other inhaled environmental irritants; (5) nonasthmatic eosinophilic bronchitis; and (6) bronchiectasis.   These conditions, singly or in combination, have accounted for up to 94% of the causes of chronic cough in prospective studies.   Other conditions have constituted no >6% of the causes in prospective studies These have included bronchogenic carcinoma, chronic interstitial pneumonia, sarcoidosis, left ventricular failure, ACEI-induced cough, and aspiration from a condition associated with pharyngeal dysfunction.  This is most likely  Classic Upper airway cough syndrome, so named because it's frequently impossible to sort out how much is  CR/sinusitis with freq throat clearing (which can be related to primary GERD)   vs  causing  secondary (" extra esophageal")  GERD from wide swings in gastric pressure that occur with throat clearing, often  promoting self use of mint and menthol lozenges that reduce the lower esophageal sphincter tone and exacerbate the problem further in a cyclical fashion.   These are the same pts (now being labeled as having "irritable larynx syndrome" by some cough centers) who not infrequently have a history of having failed to tolerate ace inhibitors,  dry powder inhalers or biphosphonates or report having atypical reflux symptoms that don't respond to standard doses of PPI , and are easily confused as having aecopd or asthma flares by even experienced allergists/ pulmonologists.  Try dymista then add chlortrimeton prn if tolerated

## 2012-07-24 ENCOUNTER — Telehealth: Payer: Self-pay | Admitting: Internal Medicine

## 2012-07-24 MED ORDER — SPIRONOLACTONE 50 MG PO TABS: ORAL_TABLET | ORAL | Status: DC

## 2012-07-24 NOTE — Telephone Encounter (Signed)
Pharmacy requesting aldactone 50 mg <> take 2 tablets by mouth daily  #60 X5 Last fill 04-25-2012 Allergies  Allergen Reactions  . Penicillins Other (See Comments)    REACTION: causes hallucinations  . Sulfamethoxazole W-Trimethoprim Swelling    REACTION: rash  . Bactrim    Dr [name] please advise Thank you

## 2012-07-24 NOTE — Telephone Encounter (Signed)
Ok to refill 

## 2012-07-24 NOTE — Telephone Encounter (Signed)
rx sent per med list aldactone 50 mg take 2 tablets in the am and 2 tablets at noon

## 2012-08-06 ENCOUNTER — Other Ambulatory Visit: Payer: Self-pay | Admitting: *Deleted

## 2012-08-06 MED ORDER — ESOMEPRAZOLE MAGNESIUM 40 MG PO CPDR: 40.0000 mg | DELAYED_RELEASE_CAPSULE | Freq: Every day | ORAL | Status: DC

## 2012-09-01 ENCOUNTER — Other Ambulatory Visit: Payer: Self-pay | Admitting: Internal Medicine

## 2012-09-01 MED ORDER — ESOMEPRAZOLE MAGNESIUM 40 MG PO CPDR: 40.0000 mg | DELAYED_RELEASE_CAPSULE | Freq: Every day | ORAL | Status: DC

## 2012-09-16 ENCOUNTER — Other Ambulatory Visit (INDEPENDENT_AMBULATORY_CARE_PROVIDER_SITE_OTHER): Payer: Medicare PPO

## 2012-09-16 ENCOUNTER — Encounter: Payer: Self-pay | Admitting: Internal Medicine

## 2012-09-16 ENCOUNTER — Ambulatory Visit (INDEPENDENT_AMBULATORY_CARE_PROVIDER_SITE_OTHER): Payer: Medicare PPO | Admitting: Internal Medicine

## 2012-09-16 VITALS — BP 140/90 | HR 73 | Temp 98.2°F | Ht 60.0 in | Wt 189.2 lb

## 2012-09-16 DIAGNOSIS — R05 Cough: Secondary | ICD-10-CM

## 2012-09-16 DIAGNOSIS — E119 Type 2 diabetes mellitus without complications: Secondary | ICD-10-CM

## 2012-09-16 DIAGNOSIS — I10 Essential (primary) hypertension: Secondary | ICD-10-CM

## 2012-09-16 DIAGNOSIS — R053 Chronic cough: Secondary | ICD-10-CM

## 2012-09-16 LAB — CBC WITH DIFFERENTIAL/PLATELET
Basophils Absolute: 0.1 10*3/uL (ref 0.0–0.1)
Eosinophils Absolute: 0.1 10*3/uL (ref 0.0–0.7)
HCT: 38 % (ref 36.0–46.0)
Lymphs Abs: 1.7 10*3/uL (ref 0.7–4.0)
MCHC: 31.6 g/dL (ref 30.0–36.0)
MCV: 75.8 fl — ABNORMAL LOW (ref 78.0–100.0)
Monocytes Absolute: 0.4 10*3/uL (ref 0.1–1.0)
Monocytes Relative: 7.2 % (ref 3.0–12.0)
Platelets: 197 10*3/uL (ref 150.0–400.0)
RDW: 14 % (ref 11.5–14.6)

## 2012-09-16 LAB — BASIC METABOLIC PANEL
Calcium: 9.5 mg/dL (ref 8.4–10.5)
Creatinine, Ser: 1.3 mg/dL — ABNORMAL HIGH (ref 0.4–1.2)
GFR: 49.82 mL/min — ABNORMAL LOW (ref >60.00)

## 2012-09-16 LAB — HEMOGLOBIN A1C: Hgb A1c MFr Bld: 8.4 % — ABNORMAL HIGH (ref 4.6–6.5)

## 2012-09-16 NOTE — Patient Instructions (Addendum)
Add pepcid 20 mg (over the counter) one at bedtime automatic  For drainage > chlortrimeton 4 mg every 4 hours (over the counter)   Please see patient coordinator before you leave today  to schedule sinus ct  Please remember to go to the lab and x-ray department downstairs for your tests - we will call you with the results when they are available.    See Tammy NP w/in 2 weeks with all your medications, even over the counter meds, separated in two separate bags, the ones you take no matter what vs the ones you stop once you feel better and take only as needed when you feel you need them.   Tammy  will generate for you a new user friendly medication calendar that will put [place] all on the same page re: your medication use.     Without this process, it simply isn't possible to assure that we are providing  your outpatient care  with  the attention to detail we feel you deserve.   If we cannot assure that you're getting that kind of care,  then we cannot manage your problem effectively from this clinic.  Once you have seen Tammy and we are sure that we're all on the same page with your medication use she will arrange follow up with me.   Tammy will also check your memory again

## 2012-09-16 NOTE — Progress Notes (Signed)
Subjective:     Patient ID: Allison Rodgers, female   DOB: [birth year]   MRN: [id number]  Brief patient profile:  66  yobf with morbid obesity complicated by DM, HTN and Dyslipidemia and chronic venous insufficiency with dependent edema.    August 28, 2010 cpx getting all better. limited by legs/hip/ knees no sob or increase leg swelling. >>labs done w/no rx changes   September 11, 2010 --Pt here for follow and med calendar. Last ov with CPX. Labs were w/no significant changes. A1C at 6.7 , s.CR holding at 1.4 . We reviewed all her labs in detail.Urine did show microscopic hematuria . Discussed a DM and low cholestrol diet. She is recovering from a prolonged bronchitis-feeling better but not back to baseline. She has clear drainge and energy level is low. We reviewed all her meds and organized her med calendar.  rec follow calendar   05/10/2011. Follow up and med review.  Seen 2 weeks ago for follow up . Labs showed BS elevated at 300 nonfasting.  Disucssed diet and exercise. Weight is going down with dietary changes.  Will check A1C today to evaluate average control.   We reviewed all her meds today and updated her med calendar w/ pt education  Appears she is taking her meds correctly.   Complains that she has seen small specks of red blood on tissues with wiping after bowel movement   Last colonoscopy was 2009.  No abdominal pain or n/v/d.  rec Follow med calendar closely and bring to each visit.  Use Preparation H Hemorrhoid cream after bowel movement  Low sweet diet.   Flu shot today   07/26/2011 Allison Rodgers/ ov cc Pt c/o increased edema "from the hips down" x 2 months - taking an extra furosemide as per calendar "maybe once a week" with no benefit (calendar clearly says take one extra daily if needed for swelling).  Denies orthopnea. [place]ed [place] to office on day of ov s doe. Denies salt excess and takes her maintenance doses of furosemide and spironlactone as per calendar  instructions rec Change the as needed furosemide to 40 mg 2 extra daily if needed (can use up to 2 extra daily or a total of 4) Watch the salt and salty food  08/23/2011 f/u ov/Allison Rodgers cc no better swelling in legs on max doses of furosemide as per calendar, new  Bifrontal L > R bandlike pressure  HA typically after lunch daily x sev weeks, ? viz Floaters not directely related to ha with no nausea  Or sinus complaints, better p tylenol and never in ams rec Please see patient coordinator before you leave today  to schedule venous dopplers> neg bilateral 08/27/11 Stop all caffeine - pay attention to the chronologic pattern of the headache and try alprazolam to see if it helps and call if worsens See Allison Rodgers as needed for arthritis > did not go     11/25/2011 f/u ov/Allison Rodgers for multiple chronic medical problems cc worse arthritis esp R back and leg x one year, swelling is some better on present rx reviewed with her on med calendar. No sob, cough. Better control of leg swelling, no overt polydypsia, polyuria, viz changes rec Be sure to see Allison Rodgers about your back and leg pain Please remember to go to the lab  department downstairs for your tests - we will call you with the results when they are available.   03/02/2012 f/u ov/Allison Rodgers not using med calendar cc  c/o having slight  HA x 3 months and states "feels bad and weak all of the time". Sugars fasting running as high has 300 fasting on glimepririede 2 mg one half each am, persistent leg swelling despite furosemide and lasix. Admits getting very forgetful re details of care and trying to use the alphabetized epic list for med rec purposes.  HA is generalized, in pms, better with tylenol with no assoc viz or neuro c/os or nausea, always absent in ams >>Change the furosemide to 2 in am and at lunch with additonal 2 at supper if needed  Increase Glimperide to 2 mg each am   03/18/2012 Follow up and MMSE /Med calendar  We reviewed all her meds and organized  them into a med calendar with pt education  She has a lot of old bottles that she says she keeps and put new meds into old bottles  Advised this is not a good idea.  Says her memory is slipping. Gets anxious when she can not remember things. Has gotten lost a couple of times. Son in [place]. She is considering going to live with him or him come here.  Does not want to go to NH.  MMSE scored a 29/30 but slow responses.  Clock draw was very abnormal. With hands of clock outside clock.  We dicussed referral to neuro due to concern for possible dementia  She refuses to go to neuro wants to be treated here.  Labs were unremarkable with neg RPR , nml tsh and nml b12 She denies headache, visual/speech changes.  Does have anxiety at times. No significant depression like symptoms.  Sugars has been running 200-250 . Amaryl was increased 2 weeks ago w/ A1C at 8.4  No low sugars.    04/15/2012 f/u ov/[name] confused with instructions, meds, "son took her list back to [place]" felt worse x 2 weeks with weak and dizziness but no specific pattern (not related to meals).  Fasting bs remain in low 200s  >med review   04/29/12 Follow up and med review     [name] Her last visit. Patient was having difficulty remembering her medications and had misplaced her medication calendar. She is brought all of her medications in today. We reviewed all her medications updated. Her medication calendar with patient education. It does appear that all her medications are up-to-date and have been refilled on time, according to the medication bottles She does easily get confused if you cannot slow down and go over the medications and with slowly We discussed the importance of keeping her list up to date and keep it with her at all times especially when she is seen at the doctor's office Blood sugars are slowly decreasing with her medication adjustments recently She has  had no low sugars She denies any chest pain, shortness of breath, abdominal pain, or increased edema We discussed once again about her memory and referral to neurology. However, she declined Referral  05/28/2012 Acute OV  Complains of right back and hip pain.  pt fell at home 05-25-12, right hip and groin area/right elbow are sore.   reports lost her balance and fell forward landing on right side.  She was able to get herself, and ambulates with minimal pain. Over the last 2 days, has been progressively worsening over and tender along the right side of her lower back into her right hip and right groin area, especially with walking. She denies any urinary symptoms, chest pain, loss of  consciousness, visual or speech changes, extremity weakness, radicular symptoms, or leg swelling >xray w/ no acute process   06/08/2012 Follow up  Returns for follow up for hip and back pain, seen 2 weeks ago after a fall.  Xrays showed no fracture.  Feeling better but still some residual soreness.  Does have some drainage and dry cough for 2 weeks.  rec Alternate ice and heat to back and hip  May use Tylenol arthritis for pain As needed   IF severe pain may use Roxicet As needed  -may cause you to be sleepy and off balance.   06/18/2012 f/u ov/Jacie Tristan cc  Hips/ back better, no need for narcotic rx.  Memory no better on aricept and pt wants to stop.  Seems to using med calendar well. rec Stop fluticasone and the aricept Dymista one twice daily GERD .     09/16/2012 f/u ov/Nada Godley cc pt states having increase sob, wheezing when moving and sitting still. along with heart palp. x 11month - seems worse around 4pm and better p supper   Main c/o is afteroon cough daily x years assoc with sense of pnds not responding to zyrtec and taking ppi qd ac as well as flonase. No excess mucus. No sob / wheeze.  Sleeping ok without nocturnal  or early am exacerbation  of respiratory  c/o's or need for noct saba. Also denies any  obvious fluctuation of symptoms with weather or environmental changes or other aggravating or alleviating factors except as outlined above  ROS  The following are not active complaints unless bolded sore throat, dysphagia, dental problems, itching, sneezing,  nasal congestion or excess/ purulent secretions, ear ache,   fever, chills, sweats, unintended wt loss, pleuritic or exertional cp, hemoptysis,  orthopnea pnd or leg swelling, presyncope, palpitations, heartburn, abdominal pain, anorexia, nausea, vomiting, diarrhea  or change in bowel or urinary habits, change in stools or urine, dysuria,hematuria,  rash, arthralgias, visual complaints, headache, numbness weakness or ataxia or problems with walking or coordination,  change in mood/affect or memory.              Allergies    1) Pcn  2) Bactrim      Family History:   asthma in her brother who also has hypertension  mother died of stroke, also had diabetes and arthritis  father had multiple myeloma  4 siblings all in relatively good health  Neg for dementia     Social History:  Patient never smoked.  positive for second-hand smoke exposure  exercises 2-3 times a week  1 cup caffeine a day  married  1 child       Past Medical History:  GERD (ICD-530.81)  ABDOMINAL PAIN, CHRONIC (ICD-789.00)  FATIGUE, CHRONIC (ICD-780.79)  DEGENERATIVE JOINT DISEASE (ICD-715.90)  - L5 radiculopathy confirmed by mri 08/2005  --refer to ortho August 24, 2008 >>> Allison Darrelyn Hillock  HYPERTENSIVE CARDIOVASCULAR DISEASE (ICD-402.90)  OBESITY (ICD-278.00)  - Target wt = 158 for BMI < 30  - Referred [facility] 04/2005  GASTRITIS, CHRONIC (ICD-535.10)  BENIGN NEOPLASM OTH&UNSPEC SITE DIGESTIVE SYSTEM (ICD-211.9)  ESOPHAGEAL STRICTURE (ICD-530.3)..............................................Marland KitchenPatterson  - EGD 12/14/07  HIATAL HERNIA (ICD-553.3)  COLONIC POLYPS, ADENOMATOUS (ICD-211.3)  - Colonoscopy 12/14/07  DIVERTICULOSIS, COLON (ICD-562.10)   DYSLIPIDEMIA (ICD-272.4)  ARTHRITIS (ICD-716.90)  DM (ICD-250.00)  HYPERTENSION (ICD-401.9)  HEALTH MAINTENANCE  - dt 03/2003  - Pneumovax 03/2003  - CPX August 28, 2010  -Mammogram 10/2009, ? abn asymmetry >>repeat ok, last mammo 4/13 >neg  MICROCYTIC ANEMA  -  DX 02/03/09, Stool g neg x 6, rx Fe 65 mg /day > improving March 17, 2009  Edema--08/10/08--venous doppler neg for dvt, ?ruptured bakers cyst-right  March 20, 2009 repeat venous doppler > neg bilaterally  May 21, 2010 repeat venous dopplers >> neg bilat  Complex med regimen>>Meds reviewed with pt education and computerized med calendar September 11, 2010 , 05/10/2011 , 03/18/2012 . 04/29/12 Memory loss  MMSE 03/18/2012 > 29/30 , Clock Draw abn , neg RPR , nml b12                     Objective:   Physical Exam obese ambulatory bf nad  Can no longer get on exam table ever with assistance wt 213 10/21/08 > 204 April 26, 2009 > 08/23/2011  191> 11/25/2011  201 > 03/02/2012  193 > 198 03/18/2012 > 186 04/15/2012 >191 05/28/2012 >184 06/08/2012 > 06/18/2012 186 > 189 09/16/2012  HEENT: nl dentition, turbinates, and orophanx. Nl external ear canals without cough reflex  Neck without JVD/Nodes/TM  Lungs clear to A and P bilaterally without cough on insp or exp maneuvers  RRR no s3 or murmur or increase in P2,tr -1+ sym bilaterally  Abd soft and benign with nl excursion in the supine position. No bruits or organomegaly   EXT/Skin  :  venous insufficiency changes , no rash  Neuro:  No focal motor deficits     Assessment:        Plan:

## 2012-09-17 LAB — ALLERGY PROFILE REGION II-DC, DE, MD, ~~LOC~~, VA
Alternaria Alternata: <0.1 kU/L
Cat Dander: <0.1 kU/L
Dog Dander: <0.1 kU/L
Elm IgE: <0.1 kU/L
IgE (Immunoglobulin E), Serum: 319.3 IU/mL — ABNORMAL HIGH (ref 0.0–180.0)
Johnson Grass: <0.1 kU/L
Meadow Grass: 0.18 kU/L — ABNORMAL HIGH
Pecan/Hickory Tree IgE: <0.1 kU/L

## 2012-09-18 ENCOUNTER — Ambulatory Visit (INDEPENDENT_AMBULATORY_CARE_PROVIDER_SITE_OTHER)
Admission: RE | Admit: 2012-09-18 | Discharge: 2012-09-18 | Disposition: A | Discharge status: Home / Self Care | Payer: Medicare PPO | Source: Ambulatory Visit | Attending: Internal Medicine | Admitting: Internal Medicine

## 2012-09-18 DIAGNOSIS — R05 Cough: Secondary | ICD-10-CM

## 2012-09-19 ENCOUNTER — Encounter: Payer: Self-pay | Admitting: Internal Medicine

## 2012-09-19 NOTE — Assessment & Plan Note (Signed)
Adequate control on present rx, reviewed  

## 2012-09-19 NOTE — Assessment & Plan Note (Signed)
-   allergy profile 09/16/2012 >  IgE 319 Pos molds - Sinus CT 09/18/2012 >> Normal limited CT scan of the sinuses.  Most likely has  Classic Upper airway cough syndrome, so named because it's frequently impossible to sort out how much is  CR/sinusitis with freq throat clearing (which can be related to primary GERD)   vs  causing  secondary (" extra esophageal")  GERD from wide swings in gastric pressure that occur with throat clearing, often  promoting self use of mint and menthol lozenges that reduce the lower esophageal sphincter tone and exacerbate the problem further in a cyclical fashion.   These are the same pts (now being labeled as having "irritable larynx syndrome" by some cough centers) who not infrequently have a history of having failed to tolerate ace inhibitors,  dry powder inhalers or biphosphonates or report having atypical reflux symptoms that don't respond to standard doses of PPI , and are easily confused as having aecopd or asthma flares by even experienced allergists/ pulmonologists.   Try 1st gen H1 per guidlines

## 2012-09-19 NOTE — Assessment & Plan Note (Signed)
Not Adequate control on present rx, reviewed >     Each maintenance medication was reviewed in detail including most importantly the difference between maintenance and as needed and under what circumstances the prns are to be used. This was done in the context of a medication calendar review which provided the patient with a user-friendly unambiguous mechanism for medication administration and reconciliation and provides an action plan for all active problems. It is critical that this be shown to every doctor  for modification during the office visit if necessary so the patient can use it as a working document.

## 2012-09-21 NOTE — Progress Notes (Signed)
Quick Note:  Spoke with pt and notified of results per Dr. Wert. Pt verbalized understanding and denied any questions.  ______ 

## 2012-09-29 ENCOUNTER — Ambulatory Visit (INDEPENDENT_AMBULATORY_CARE_PROVIDER_SITE_OTHER): Payer: Medicare PPO | Admitting: Adult Health

## 2012-09-29 ENCOUNTER — Encounter: Payer: Self-pay | Admitting: Adult Health

## 2012-09-29 DIAGNOSIS — R413 Other amnesia: Secondary | ICD-10-CM

## 2012-09-29 DIAGNOSIS — E119 Type 2 diabetes mellitus without complications: Secondary | ICD-10-CM

## 2012-09-29 NOTE — Patient Instructions (Addendum)
Remain Off Aricept  We are referring you to Neurology for memory issues  Increase Lantus 30 units daily  Follow med calendar closely and bring to each visit.  follow up Dr. [name]  In 3 months and As needed

## 2012-09-29 NOTE — Progress Notes (Signed)
Subjective:     Patient ID: Allison Rodgers, female   DOB: [birth year]   MRN: [id number]  Brief patient profile:  66  yobf with morbid obesity complicated by DM, HTN and Dyslipidemia and chronic venous insufficiency with dependent edema.    August 28, 2010 cpx getting all better. limited by legs/hip/ knees no sob or increase leg swelling. >>labs done w/no rx changes   September 11, 2010 --Pt here for follow and med calendar. Last ov with CPX. Labs were w/no significant changes. A1C at 6.7 , s.CR holding at 1.4 . We reviewed all her labs in detail.Urine did show microscopic hematuria . Discussed a DM and low cholestrol diet. She is recovering from a prolonged bronchitis-feeling better but not back to baseline. She has clear drainge and energy level is low. We reviewed all her meds and organized her med calendar.  rec follow calendar   05/10/2011. Follow up and med review.  Seen 2 weeks ago for follow up . Labs showed BS elevated at 300 nonfasting.  Disucssed diet and exercise. Weight is going down with dietary changes.  Will check A1C today to evaluate average control.   We reviewed all her meds today and updated her med calendar w/ pt education  Appears she is taking her meds correctly.   Complains that she has seen small specks of red blood on tissues with wiping after bowel movement   Last colonoscopy was 2009.  No abdominal pain or n/v/d.  rec Follow med calendar closely and bring to each visit.  Use Preparation H Hemorrhoid cream after bowel movement  Low sweet diet.   Flu shot today   07/26/2011 Wert/ ov cc Pt c/o increased edema "from the hips down" x 2 months - taking an extra furosemide as per calendar "maybe once a week" with no benefit (calendar clearly says take one extra daily if needed for swelling).  Denies orthopnea. Walked [place] to office on day of ov s doe. Denies salt excess and takes her maintenance doses of furosemide and spironlactone as per calendar  instructions rec Change the as needed furosemide to 40 mg 2 extra daily if needed (can use up to 2 extra daily or a total of 4) Watch the salt and salty food  08/23/2011 f/u ov/Wert cc no better swelling in legs on max doses of furosemide as per calendar, new  Bifrontal L > R bandlike pressure  HA typically after lunch daily x sev weeks, ? viz Floaters not directely related to ha with no nausea  Or sinus complaints, better p tylenol and never in ams rec Please see patient coordinator before you leave today  to schedule venous dopplers> neg bilateral 08/27/11 Stop all caffeine - pay attention to the chronologic pattern of the headache and try alprazolam to see if it helps and call if worsens See [name] for arthritis > did not go     11/25/2011 f/u ov/Wert for multiple chronic medical problems cc worse arthritis esp R back and leg x one year, swelling is some better on present rx reviewed with her on med calendar. No sob, cough. Better control of leg swelling, no overt polydypsia, polyuria, viz changes rec Be sure to see Dr Gladstone Lighter about your back and leg pain Please remember to go to the lab  department downstairs for your tests - we will call you with the results when they are available.   03/02/2012 f/u ov/Wert not using med calendar cc  c/o having slight  HA x 3 months and states "feels bad and weak all of the time". Sugars fasting running as high has 300 fasting on glimepririede 2 mg one half each am, persistent leg swelling despite furosemide and lasix. Admits getting very forgetful re details of care and trying to use the alphabetized epic list for med rec purposes.  HA is generalized, in pms, better with tylenol with no assoc viz or neuro c/os or nausea, always absent in ams >>Change the furosemide to 2 in am and at lunch with additonal 2 at supper if needed  Increase Glimperide to 2 mg each am   03/18/2012 Follow up and MMSE /Med calendar  We reviewed all her meds and organized  them into a med calendar with pt education  She has a lot of old bottles that she says she keeps and put new meds into old bottles  Advised this is not a good idea.  Says her memory is slipping. Gets anxious when she can not remember things. Has gotten lost a couple of times. Son in [place]. She is considering going to live with him or him come here.  Does not want to go to NH.  MMSE scored a 29/30 but slow responses.  Clock draw was very abnormal. With hands of clock outside clock.  We dicussed referral to neuro due to concern for possible dementia  She refuses to go to neuro wants to be treated here.  Labs were unremarkable with neg RPR , nml tsh and nml b12 She denies headache, visual/speech changes.  Does have anxiety at times. No significant depression like symptoms.  Sugars has been running 200-250 . Amaryl was increased 2 weeks ago w/ A1C at 8.4  No low sugars.    04/15/2012 f/u ov/Wert confused with instructions, meds, "son took her list back to [place]" felt worse x 2 weeks with weak and dizziness but no specific pattern (not related to meals).  Fasting bs remain in low 200s  >med review   04/29/12 Follow up and med review     [name] Her last visit. Patient was having difficulty remembering her medications and had misplaced her medication calendar. She is brought all of her medications in today. We reviewed all her medications updated. Her medication calendar with patient education. It does appear that all her medications are up-to-date and have been refilled on time, according to the medication bottles She does easily get confused if you cannot slow down and go over the medications and with slowly We discussed the importance of keeping her list up to date and keep it with her at all times especially when she is seen at the doctor's office Blood sugars are slowly decreasing with her medication adjustments recently She has  had no low sugars She denies any chest pain, shortness of breath, abdominal pain, or increased edema We discussed once again about her memory and referral to neurology. However, she declined Referral  05/28/2012 Acute OV  Complains of right back and hip pain.  pt fell at home 05-25-12, right hip and groin area/right elbow are sore.   reports lost her balance and fell forward landing on right side.  She was able to get herself, and ambulates with minimal pain. Over the last 2 days, has been progressively worsening over and tender along the right side of her lower back into her right hip and right groin area, especially with walking. She denies any urinary symptoms, chest pain, loss of  consciousness, visual or speech changes, extremity weakness, radicular symptoms, or leg swelling >xray w/ no acute process   06/08/2012 Follow up  Returns for follow up for hip and back pain, seen 2 weeks ago after a fall.  Xrays showed no fracture.  Feeling better but still some residual soreness.  Does have some drainage and dry cough for 2 weeks.  rec Alternate ice and heat to back and hip  May use Tylenol arthritis for pain As needed   IF severe pain may use Roxicet As needed  -may cause you to be sleepy and off balance.   06/18/2012 f/u ov/Wert cc  Hips/ back better, no need for narcotic rx.  Memory no better on aricept and pt wants to stop.  Seems to using med calendar well. rec Stop fluticasone and the aricept Dymista one twice daily GERD .     09/16/2012 f/u ov/Wert cc pt states having increase sob, wheezing when moving and sitting still. along with heart palp. x 18month - seems worse around 4pm and better p supper >>RAST + /IGE ~319 , CT sinus neg   09/29/2012 Follow u p Pt returns for a 2 week follow up and med review  Her son from [place] is in town. He is trying to help her with her meds  We organized all her meds and updated her med calendar w/ pt education  She has a couple of old/expired  bottles -it is unclear if she is putting new meds into old bottles or they are expired.  She does admit she does not take her fluid pills as recommended.  I emphasized the importance of med compliance.  Son is concerned regarding memory . She does have mild forgetfullness but mostly seems to not process information well sometimes.  Repeated MMSE exam today. Tested well w/ good flow at 30/30 . Clock draw was slightly abn as she wrote the time instead of the hands of clock.   Previous MMSE 03/2012 29/30 . rpr /b12 nml 8/13 .  Labs last ov showed A1C tr up at 8.4.  No polyuria/polydipsia.  Previously started on Aricept 03/2012 . Stoppled 06/2012             Allergies    1) Pcn  2) Bactrim      Family History:   asthma in her brother who also has hypertension  mother died of stroke, also had diabetes and arthritis  father had multiple myeloma  4 siblings all in relatively good health  Neg for dementia     Social History:  Patient never smoked.  positive for second-hand smoke exposure  exercises 2-3 times a week  1 cup caffeine a day  married  1 child       Past Medical History:  GERD (ICD-530.81)  ABDOMINAL PAIN, CHRONIC (ICD-789.00)  FATIGUE, CHRONIC (ICD-780.79)  DEGENERATIVE JOINT DISEASE (ICD-715.90)  - L5 radiculopathy confirmed by mri 08/2005  --refer to ortho August 24, 2008 >>> Dr Darrelyn Hillock  HYPERTENSIVE CARDIOVASCULAR DISEASE (ICD-402.90)  OBESITY (ICD-278.00)  - Target wt = 158 for BMI < 30  - Referred [facility] 04/2005  GASTRITIS, CHRONIC (ICD-535.10)  BENIGN NEOPLASM OTH&UNSPEC SITE DIGESTIVE SYSTEM (ICD-211.9)  ESOPHAGEAL STRICTURE (ICD-530.3)..............................................Marland KitchenPatterson  - EGD 12/14/07  HIATAL HERNIA (ICD-553.3)  COLONIC POLYPS, ADENOMATOUS (ICD-211.3)  - Colonoscopy 12/14/07  DIVERTICULOSIS, COLON (ICD-562.10)  DYSLIPIDEMIA (ICD-272.4)  ARTHRITIS (ICD-716.90)  DM (ICD-250.00)  HYPERTENSION (ICD-401.9)  HEALTH MAINTENANCE   - dt 03/2003  - Pneumovax 03/2003  - CPX August 28, 2010  -  Mammogram 10/2009, ? abn asymmetry >>repeat ok, last mammo 4/13 >neg  MICROCYTIC ANEMA  - DX 02/03/09, Stool g neg x 6, rx Fe 65 mg /day > improving March 17, 2009  Edema--08/10/08--venous doppler neg for dvt, ?ruptured bakers cyst-right  March 20, 2009 repeat venous doppler > neg bilaterally  May 21, 2010 repeat venous dopplers >> neg bilat  Complex med regimen>>Meds reviewed with pt education and computerized med calendar September 11, 2010 , 05/10/2011 , 03/18/2012 . 04/29/12 Memory loss  MMSE 03/18/2012 > 29/30 , Clock Draw abn , neg RPR , nml b12 , 09/29/2012 >30/30 , clock abn >refer to neuro   09/29/2012 RAST + w/ IGE at 319                   Objective:   Physical Exam obese ambulatory bf nad  Can no longer get on exam table ever with assistance wt 213 10/21/08 > 204 April 26, 2009 > 08/23/2011  191> 11/25/2011  201 > 03/02/2012  193 > 198 03/18/2012 > 186 04/15/2012 >191 05/28/2012 >184 06/08/2012 > 06/18/2012 186 > 189 09/16/2012 > HEENT: nl dentition, turbinates, and orophanx. Nl external ear canals without cough reflex  Neck without JVD/Nodes/TM  Lungs clear to A and P bilaterally without cough on insp or exp maneuvers  RRR no s3 or murmur or increase in P2,tr -1+ sym bilaterally  Abd soft and benign with nl excursion in the supine position. No bruits or organomegaly   EXT/Skin  :  venous insufficiency changes , no rash  Neuro:  No focal motor deficits     Assessment:        Plan:

## 2012-09-29 NOTE — Assessment & Plan Note (Addendum)
No significant change in MMSE /clock draw from 03/2012  RPR neg, B12 nml  Today >MMSE 30/30 , abn clock draw   strated on aricept8/2013 > Stopped aricept 11/013  Discussed several options , will proceed with formal neuro w/up for mild memory impairment.per son request .

## 2012-09-29 NOTE — Assessment & Plan Note (Signed)
Not at goal  Increase lantus 30 u daily  follow up 3 months  Diet ed

## 2012-09-29 NOTE — Addendum Note (Signed)
Addended by: Boone Master E on: 09/29/2012 02:50 PM   Modules accepted: Orders, Medications

## 2012-10-12 ENCOUNTER — Telehealth: Payer: Self-pay | Admitting: Internal Medicine

## 2012-10-12 DIAGNOSIS — R413 Other amnesia: Secondary | ICD-10-CM

## 2012-10-12 NOTE — Telephone Encounter (Signed)
Spoke with pt She states that the neurologist that we referred her to can not see her due to her insurance According to records, it looks like she is going to be referred to a different MD there though Looks like the PCC's have been trying to reach her Please advise thanks!

## 2012-10-14 ENCOUNTER — Other Ambulatory Visit: Payer: Self-pay | Admitting: Internal Medicine

## 2012-10-14 MED ORDER — FUROSEMIDE 40 MG PO TABS: ORAL_TABLET | ORAL | Status: DC

## 2012-10-16 NOTE — Telephone Encounter (Signed)
i guess if her md is ok we can send her to [facility] Tobe Sos

## 2012-10-19 NOTE — Telephone Encounter (Signed)
Yes please thannks Tobe Sos

## 2012-10-19 NOTE — Telephone Encounter (Signed)
Order has been placed. Patient aware order has been placed and and to be on the "look out" for a call from office to schedule her in to see neuro.

## 2012-10-19 NOTE — Telephone Encounter (Signed)
Do we need to put in new referral for this. Please advise thanks

## 2012-11-03 ENCOUNTER — Encounter: Payer: Self-pay | Admitting: Diagnostic Neuroimaging

## 2012-11-03 ENCOUNTER — Ambulatory Visit (INDEPENDENT_AMBULATORY_CARE_PROVIDER_SITE_OTHER): Payer: Medicare HMO | Admitting: Diagnostic Neuroimaging

## 2012-11-03 VITALS — BP 172/73 | HR 81 | Temp 97.9°F | Ht 60.0 in | Wt 197.5 lb

## 2012-11-03 DIAGNOSIS — G3184 Mild cognitive impairment, so stated: Secondary | ICD-10-CM

## 2012-11-03 DIAGNOSIS — R413 Other amnesia: Secondary | ICD-10-CM

## 2012-11-03 NOTE — Patient Instructions (Signed)
Caution with driving and living alone. I will check MRI brain. At next visit, we may reconsider memory medications. Try to stay engaged in social, mental and physical activities that you enjoy.

## 2012-11-03 NOTE — Progress Notes (Signed)
[facility]  PATIENT: Allison Rodgers DOB: 01-04-1924  REFERRING CLINICIAN: Wert HISTORY FROM: patient and son REASON FOR VISIT: new consult   HISTORICAL  CHIEF COMPLAINT:  Chief Complaint  Patient presents with  . Memory Loss    HISTORY OF PRESENT ILLNESS:   79 year old right-handed female with hypertension, diabetes, arthritis, anxiety, here for evaluation of memory problems. Patient accompanied by son today.  Patient reports 2-3 month history of short-term memory loss, forgetting recent conversations, tasks, events. Sometimes she forgets to pay her bills. Sometimes she forgets to lock doors. Patient continues to live alone and takes care of her activities of daily living. She has been more withdrawn and less active socially. Patient having more difficulty with processing written language and documents.  According to the son her memory problems have actually been going on for at least one year. He was out of town but checks in on patient fairly frequently.  Patient also having intermittent episodes of auditory and visual hallucinations. Sometimes she sees someone in the driveway, then looked back again and does not see anyone there. Sometimes she hears someone knocking on her door but when she checks no one is there. This been going on for the past few months.  Patient was treated with Aricept for a few months, and then discontinued. Patient was restarted on this again a second time but discontinued. Patient did not notice any benefit or side effects of the medication.  REVIEW OF SYSTEMS: Full 14 system review of systems performed and notable only for fatigue, chest pain, swelling in legs, spinning sensation, pulse, blurred vision, shortness of breath, cough, constipation, feeling cold, joint pain, aching muscles, allergies, headache, sleepiness, restless legs, she weakness, dizziness, disinterest in activities, racing thoughts, change in  appetite.  ALLERGIES: Allergies  Allergen Reactions  . Penicillins Other (See Comments)    REACTION: causes hallucinations  . Sulfamethoxazole W-Trimethoprim Swelling    REACTION: rash  . Bactrim     HOME MEDICATIONS: Outpatient Prescriptions Prior to Visit  Medication Sig Dispense Refill  . acetaminophen (TYLENOL) 500 MG tablet Per bottle as needed for pain      . ALPRAZolam (XANAX) 0.25 MG tablet Take 0.25 mg by mouth every 6 (six) hours as needed for anxiety.      Marland Kitchen aspirin 81 MG chewable tablet Chew 81 mg by mouth daily.        . Attapulgite (KAOPECTATE) 750 MG/15ML LIQD Per bottle      . bimatoprost (LUMIGAN) 0.03 % ophthalmic solution Place 1 drop into both eyes at bedtime.       . Calcium Carbonate-Vitamin D (CALCIUM PLUS VITAMIN D PO) Take 1 tablet by mouth daily.        . chlorpheniramine (CHLOR-TRIMETON) 4 MG tablet Take 4 mg by mouth 2 (two) times daily as needed (for nasal drip, drainage).      Marland Kitchen dextromethorphan (TUSSIN COUGH) 15 MG/5ML syrup 1-2 tsp every 4 hours as needed for cough      . dorzolamide (TRUSOPT) 2 % ophthalmic solution Place 1 drop into both eyes at bedtime.      Marland Kitchen esomeprazole (NEXIUM) 40 MG capsule Take 1 capsule (40 mg total) by mouth daily after breakfast.  30 capsule  5  . famotidine (PEPCID) 20 MG tablet Take 20 mg by mouth at bedtime.      . fluocinonide cream (LIDEX) 0.05 % Apply topically 2 (two) times daily as needed.      . furosemide (LASIX) 40 MG tablet  2 tablets every am and 2 extra if needed for swelling  60 tablet  3  . glimepiride (AMARYL) 2 MG tablet Take 1 tablet (2 mg total) by mouth daily before breakfast.  30 tablet  6  . insulin glargine (LANTUS) 100 UNIT/ML injection Inject 30 Units into the skin at bedtime.      . methylcellulose (CITRUCEL) oral powder 1 tbsp by mouth once daily      . oxyCODONE-acetaminophen (ROXICET) 5-325 MG/5ML solution Take 5 mLs by mouth every 6 (six) hours as needed.       . sodium chloride (OCEAN) 0.65 %  nasal spray Place 2 sprays into the nose every 4 (four) hours as needed.       Marland Kitchen spironolactone (ALDACTONE) 50 MG tablet 2 tablets every am and 2 tablets at noon  120 tablet  5   No facility-administered medications prior to visit.    PAST MEDICAL HISTORY: Past Medical History  Diagnosis Date  . GERD (gastroesophageal reflux disease)   . Chronic abdominal pain   . DJD (degenerative joint disease)   . Hypertensive cardiovascular disease   . Obesity   . Gastritis, chronic   . 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 Esophageal stricture   . Hiatal hernia   . Hx of adenomatous colonic polyps   . Diverticulosis   . Dyslipidemia   . Arthritis   . DM (diabetes mellitus)   . Hypertension   . Microcytic anemia   . Edema   . Renal disorder     PAST SURGICAL HISTORY: Past Surgical History  Procedure Laterality Date  . Appendectomy    . Cholecystectomy  1999  . Vesicovaginal fistula closure w/ tah  1957  . Bilateral oophorectomy  1957  . Hernia repair  08/2005    umbilical and ventral hernia repair by Dr. Jamey Ripa  . Rotator cuff repair  7/05    right  . Back surgery      FAMILY HISTORY: Family History  Problem Relation Age of Onset  . Stroke Mother   . Cancer Father   . Hypertension Other     SOCIAL HISTORY:  History   Social History  . Marital Status: Widowed    Spouse Name: N/A    Number of Children: N/A  . Years of Education: N/A   Occupational History  . Not on file.   Social History Main Topics  . Smoking status: Never Smoker   . Smokeless tobacco: Never Used  . Alcohol Use: No  . Drug Use: No  . Sexually Active: Not on file   Other Topics Concern  . Not on file   Social History Narrative   Pt lives at home alone. 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 She drinks 1 cup of caffeine per month and rarely drinks sodas.     PHYSICAL EXAM  Filed Vitals:   11/03/12 1043  BP: 172/73   Pulse: 81  Temp: 97.9 F (36.6 C)  TempSrc: Oral  Height: 5' (1.524 m)  Weight: 197 lb 8 oz (89.585 kg)   Body mass index is 38.57 kg/(m^2).  GENERAL EXAM: Patient is in no distress  CARDIOVASCULAR: Regular rate and rhythm, no murmurs, no carotid bruits  NEUROLOGIC: MENTAL STATUS: awake, alert, language fluent, comprehension intact, naming intact; MMSE 26/30 (MISSES NAME OF BUILDING, WORLD BACKWARDS). GDS 3, AFT 5. POSITIVE SNOUT, MYERSONS AND PALMOMENTAL. CRANIAL NERVE: no papilledema on  fundoscopic exam, pupils equal and reactive to light, visual fields full to confrontation, extraocular muscles intact, no nystagmus, facial sensation and strength symmetric, uvula midline, shoulder shrug symmetric, tongue midline. MOTOR: normal bulk and tone, full strength in the BUE, BLE; EXCEPT LIMITED IN RUE DUE TO PAIN. SENSORY: DECR VIB AT TOES (< 5 SECS) COORDINATION: finger-nose-finger, fine finger movements SLOW THROUGHOUT. REFLEXES: BUE 1, KNEES 2, ANKLES 1. GAIT/STATION: SLOW, CAUTIOUS GAIT. SLOW TO GET UP FROM CHAIR AND ON TO BED. UNSTEADY WITHOUT CANE.  DIAGNOSTIC DATA (LABS, IMAGING, TESTING) - I reviewed patient records, labs, notes, testing and imaging myself where available.  Lab Results  Component Value Date   WBC 5.9 09/16/2012   HGB 12.0 09/16/2012   HCT 38.0 09/16/2012   MCV 75.8* 09/16/2012   PLT 197.0 09/16/2012      Component Value Date/Time   NA 139 09/16/2012 1209   K 4.5 09/16/2012 1209   CL 107 09/16/2012 1209   CO2 27 09/16/2012 1209   GLUCOSE 163* 09/16/2012 1209   GLUCOSE 97 07/16/2006 1041   BUN 19 09/16/2012 1209   CREATININE 1.3* 09/16/2012 1209   CALCIUM 9.5 09/16/2012 1209   PROT 7.6 08/23/2011 1200   ALBUMIN 3.9 08/23/2011 1200   AST 17 08/23/2011 1200   ALT 15 08/23/2011 1200   ALKPHOS 82 08/23/2011 1200   BILITOT 0.5 08/23/2011 1200   GFRNONAA 32* 12/16/2010 0540   GFRAA  Value: 38        The eGFR has been calculated using the MDRD equation. This calculation has not been  validated in all clinical situations. eGFR's persistently <60 mL/min signify possible Chronic Kidney Disease.* 12/16/2010 0540   Lab Results  Component Value Date   CHOL 178 11/25/2011   HDL 55.50 11/25/2011   LDLCALC 90 11/25/2011   TRIG 165.0* 11/25/2011   CHOLHDL 3 11/25/2011   Lab Results  Component Value Date   HGBA1C 8.4* 09/16/2012   Lab Results  Component Value Date   VITAMINB12 442 03/18/2012   Lab Results  Component Value Date   TSH 1.04 03/18/2012   No CT or MRI brain studies in PACS.  ASSESSMENT AND PLAN  79 y.o. year old female  has a past medical history of GERD (gastroesophageal reflux disease); Chronic abdominal pain; DJD (degenerative joint disease); Hypertensive cardiovascular disease; Obesity; Gastritis, chronic; Benign neoplasm of other and unspecified site of the digestive system; Esophageal stricture; Hiatal hernia; adenomatous colonic polyps; Diverticulosis; Dyslipidemia; Arthritis; DM (diabetes mellitus); Hypertension; Microcytic anemia; Edema; and Renal disorder. here with 1 year of progressive short term memory loss.  Has intermittent anxiety, auditory and visual hallucinations. MMSE 26/30. Has frontal release signs (snout, myersons, palmomental) which raises possibility for an underlying neurodegenerative process.  Ddx: neurodegenerative (MCI, alzheimers, dementia with lewy bodies, vascular dementia), pseudo-dementia of depression/anxiety, metabolic, medication  Will check MRI; consider aricept or namenda at next visit. Patient and son will monitor her mood status. May be a good idea to consider treatment for underlying anxiety. Also, it is probably not a good idea for her to live alone or continue driving, given her cognitive complaints and her physical status (slow movements, poor gait). Patient and son agree that these challenging decisions need to be contemplated.  Orders Placed This Encounter  Procedures  . MR Brain Wo Contrast    Suanne Marker, MD  11/03/2012, 11:35AM Certified in Neurology, Neurophysiology and Neuroimaging  [facility] [address] [place], [place] [address] [phone]

## 2012-11-12 ENCOUNTER — Telehealth: Payer: Self-pay

## 2012-11-12 NOTE — Telephone Encounter (Signed)
Ok for xanax. -VRP 

## 2012-11-12 NOTE — Telephone Encounter (Signed)
Pt called says she is having MRI on 10/14/12, requesting xanax.

## 2012-11-14 ENCOUNTER — Ambulatory Visit
Admission: RE | Admit: 2012-11-14 | Discharge: 2012-11-14 | Disposition: A | Discharge status: Home / Self Care | Payer: Medicare PPO | Source: Ambulatory Visit | Attending: Diagnostic Neuroimaging | Admitting: Diagnostic Neuroimaging

## 2012-11-14 DIAGNOSIS — G3184 Mild cognitive impairment, so stated: Secondary | ICD-10-CM

## 2012-11-14 DIAGNOSIS — R413 Other amnesia: Secondary | ICD-10-CM

## 2012-12-22 ENCOUNTER — Ambulatory Visit (INDEPENDENT_AMBULATORY_CARE_PROVIDER_SITE_OTHER): Payer: Medicare PPO | Admitting: Internal Medicine

## 2012-12-22 ENCOUNTER — Encounter: Payer: Self-pay | Admitting: Internal Medicine

## 2012-12-22 ENCOUNTER — Encounter: Payer: Self-pay | Admitting: Gastroenterology

## 2012-12-22 ENCOUNTER — Telehealth: Payer: Self-pay | Admitting: Gastroenterology

## 2012-12-22 ENCOUNTER — Other Ambulatory Visit (INDEPENDENT_AMBULATORY_CARE_PROVIDER_SITE_OTHER): Payer: Medicare PPO

## 2012-12-22 VITALS — BP 116/80 | HR 80 | Temp 97.7°F | Ht 60.0 in | Wt 198.0 lb

## 2012-12-22 DIAGNOSIS — E119 Type 2 diabetes mellitus without complications: Secondary | ICD-10-CM

## 2012-12-22 DIAGNOSIS — D649 Anemia, unspecified: Secondary | ICD-10-CM

## 2012-12-22 DIAGNOSIS — M129 Arthropathy, unspecified: Secondary | ICD-10-CM

## 2012-12-22 DIAGNOSIS — N189 Chronic kidney disease, unspecified: Secondary | ICD-10-CM

## 2012-12-22 DIAGNOSIS — I10 Essential (primary) hypertension: Secondary | ICD-10-CM

## 2012-12-22 DIAGNOSIS — R1110 Vomiting, unspecified: Secondary | ICD-10-CM

## 2012-12-22 LAB — CBC WITH DIFFERENTIAL/PLATELET
Basophils Absolute: 0 10*3/uL (ref 0.0–0.1)
Eosinophils Relative: 2.6 % (ref 0.0–5.0)
Lymphs Abs: 1.6 10*3/uL (ref 0.7–4.0)
MCV: 74.6 fl — ABNORMAL LOW (ref 78.0–100.0)
Monocytes Absolute: 0.5 10*3/uL (ref 0.1–1.0)
Monocytes Relative: 7.3 % (ref 3.0–12.0)
Neutrophils Relative %: 65.3 % (ref 43.0–77.0)
Platelets: 197 10*3/uL (ref 150.0–400.0)
RDW: 13.7 % (ref 11.5–14.6)
WBC: 6.4 10*3/uL (ref 4.5–10.5)

## 2012-12-22 LAB — BASIC METABOLIC PANEL
Calcium: 9.4 mg/dL (ref 8.4–10.5)
GFR: 40.35 mL/min — ABNORMAL LOW (ref >60.00)
Glucose, Bld: 133 mg/dL — ABNORMAL HIGH (ref 70–99)
Potassium: 4.8 mEq/L (ref 3.5–5.1)
Sodium: 140 mEq/L (ref 135–145)

## 2012-12-22 MED ORDER — ALPRAZOLAM 0.25 MG PO TABS: 0.2500 mg | ORAL_TABLET | Freq: Four times a day (QID) | ORAL | Status: DC | PRN

## 2012-12-22 NOTE — Telephone Encounter (Signed)
Bjorn Loser called back and pt wants to wait until the avail which is 01-07-13 @ 9:00. Ardis Hughs of the appt and mailed letter to pt

## 2012-12-22 NOTE — Patient Instructions (Addendum)
Please remember to go to the lab department downstairs for your tests - we will call you with the results when they are available.  Please see patient coordinator before you leave today  to schedule evaluation by Jarold Motto for vomiting and black stools  Keep appt for Dr Jeanelle Malling  And discuss with him the R arm numbness and weakness  Please schedule a follow up visit in 3 months but call sooner if needed to Tammy NP on return

## 2012-12-22 NOTE — Assessment & Plan Note (Signed)
F/u Dr Netta Corrigan prn

## 2012-12-22 NOTE — Assessment & Plan Note (Signed)
Adequate control on present rx, reviewed     Each maintenance medication was reviewed in detail including most importantly the difference between maintenance and as needed and under what circumstances the prns are to be used. This was done in the context of a medication calendar review which provided the patient with a user-friendly unambiguous mechanism for medication administration and reconciliation and provides an action plan for all active problems. It is critical that this be shown to every doctor  for modification during the office visit if necessary so the patient can use it as a working document.     

## 2012-12-22 NOTE — Assessment & Plan Note (Signed)
At goal on present rx, reviewed

## 2012-12-22 NOTE — Telephone Encounter (Signed)
lmom for Rhonda to call back.

## 2012-12-22 NOTE — Telephone Encounter (Signed)
Bjorn Loser reports pt has been vomiting at times and has black stools at times. She was given an appt with Doug Sou, PA on 12/24/12. Bjorn Loser will notify the pt of the appt.

## 2012-12-22 NOTE — Assessment & Plan Note (Signed)
She does have mild microcytic anemia with h/o dark stools > GI eval rec

## 2012-12-22 NOTE — Progress Notes (Signed)
Subjective:     Patient ID: Allison Rodgers, female   DOB: [birth year]   MRN: [id number]  Brief patient profile:  66  yobf with morbid obesity complicated by DM, HTN and Dyslipidemia and chronic venous insufficiency with dependent edema.    August 28, 2010 cpx getting all better. limited by legs/hip/ knees no sob or increase leg swelling. >>labs done w/no rx changes   September 11, 2010 --Pt here for follow and med calendar. Last ov with CPX. Labs were w/no significant changes. A1C at 6.7 , s.CR holding at 1.4 . We reviewed all her labs in detail.Urine did show microscopic hematuria . Discussed a DM and low cholestrol diet. She is recovering from a prolonged bronchitis-feeling better but not back to baseline. She has clear drainge and energy level is low. We reviewed all her meds and organized her med calendar.  rec follow calendar   05/10/2011. Follow up and med review.  Seen 2 weeks ago for follow up . Labs showed BS elevated at 300 nonfasting.  Disucssed diet and exercise. Weight is going down with dietary changes.  Will check A1C today to evaluate average control.   We reviewed all her meds today and updated her med calendar w/ pt education  Appears she is taking her meds correctly.   Complains that she has seen small specks of red blood on tissues with wiping after bowel movement   Last colonoscopy was 2009.  No abdominal pain or n/v/d.  rec Follow med calendar closely and bring to each visit.  Use Preparation H Hemorrhoid cream after bowel movement  Low sweet diet.   Flu shot today   07/26/2011 Allison Rodgers/ ov cc Pt c/o increased edema "from the hips down" x 2 months - taking an extra furosemide as per calendar "maybe once a week" with no benefit (calendar clearly says take one extra daily if needed for swelling).  Denies orthopnea. Walked up [place] to office on day of ov s doe. Denies salt excess and takes her maintenance doses of furosemide and spironlactone as per calendar  instructions rec Change the as needed furosemide to 40 mg 2 extra daily if needed (can use up to 2 extra daily or a total of 4) Watch the salt and salty food  08/23/2011 f/u ov/Allison Rodgers cc no better swelling in legs on max doses of furosemide as per calendar, new  Bifrontal L > R bandlike pressure  HA typically after lunch daily x sev weeks, ? viz Floaters not directely related to ha with no nausea  Or sinus complaints, better p tylenol and never in ams rec Please see patient coordinator before you leave today  to schedule venous dopplers> neg bilateral 08/27/11 Stop all caffeine - pay attention to the chronologic pattern of the headache and try alprazolam to see if it helps and call if worsens See Allison Rodgers as needed for arthritis > did not go     11/25/2011 f/u ov/Allison Rodgers for multiple chronic medical problems cc worse arthritis esp R back and leg x one year, swelling is some better on present rx reviewed with her on med calendar. No sob, cough. Better control of leg swelling, no overt polydypsia, polyuria, viz changes rec Be sure to see Dr Gladstone Lighter about your back and leg pain Please remember to go to the lab  department downstairs for your tests - we will call you with the results when they are available.   03/02/2012 f/u ov/Allison Rodgers not using med calendar cc  c/o having slight  HA x 3 months and states "feels bad and weak all of the time". Sugars fasting running as high has 300 fasting on glimepririede 2 mg one half each am, persistent leg swelling despite furosemide and lasix. Admits getting very forgetful re details of care and trying to use the alphabetized epic list for med rec purposes.  HA is generalized, in pms, better with tylenol with no assoc viz or neuro c/os or nausea, always absent in ams >>Change the furosemide to 2 in am and at lunch with additonal 2 at supper if needed  Increase Glimperide to 2 mg each am   03/18/2012 Follow up and MMSE /Med calendar  We reviewed all her meds and organized  them into a med calendar with pt education  She has a lot of old bottles that she says she keeps and put new meds into old bottles  Advised this is not a good idea.  Says her memory is slipping. Gets anxious when she can not remember things. Has gotten lost a couple of times. Son in [place]. She is considering going to live with him or him come here.  Does not want to go to NH.  MMSE scored a 29/30 but slow responses.  Clock draw was very abnormal. With hands of clock outside clock.  We dicussed referral to neuro due to concern for possible dementia  She refuses to go to neuro wants to be treated here.  Labs were unremarkable with neg RPR , nml tsh and nml b12 She denies headache, visual/speech changes.  Does have anxiety at times. No significant depression like symptoms.  Sugars has been running 200-250 . Amaryl was increased 2 weeks ago w/ A1C at 8.4  No low sugars.    04/15/2012 f/u ov/Allison Rodgers confused with instructions, meds, "son took her list back to [place]" felt worse x 2 weeks with weak and dizziness but no specific pattern (not related to meals).  Fasting bs remain in low 200s  >med review   04/29/12 Follow up and med review     [name] Her last visit. Patient was having difficulty remembering her medications and had misplaced her medication calendar. She is brought all of her medications in today. We reviewed all her medications updated. Her medication calendar with patient education. It does appear that all her medications are up-to-date and have been refilled on time, according to the medication bottles She does easily get confused if you cannot slow down and go over the medications and with slowly We discussed the importance of keeping her list up to date and keep it with her at all times especially when she is seen at the doctor's office Blood sugars are slowly decreasing with her medication adjustments recently She has  had no low sugars She denies any chest pain, shortness of breath, abdominal pain, or increased edema We discussed once again about her memory and referral to neurology. However, she declined Referral  05/28/2012 Acute OV  Complains of right back and hip pain.  pt fell at home 05-25-12, right hip and groin area/right elbow are sore.   reports lost her balance and fell forward landing on right side.  She was able to get herself, and ambulates with minimal pain. Over the last 2 days, has been progressively worsening over and tender along the right side of her lower back into her right hip and right groin area, especially with walking. She denies any urinary symptoms, chest pain, loss of  consciousness, visual or speech changes, extremity weakness, radicular symptoms, or leg swelling >xray w/ no acute process   06/08/2012 Follow up  Returns for follow up for hip and back pain, seen 2 weeks ago after a fall.  Xrays showed no fracture.  Feeling better but still some residual soreness.  Does have some drainage and dry cough for 2 weeks.  rec Alternate ice and heat to back and hip  May use Tylenol arthritis for pain As needed   IF severe pain may use Roxicet As needed  -may cause you to be sleepy and off balance.   06/18/2012 f/u ov/Allison Rodgers cc  Hips/ back better, no need for narcotic rx.  Memory no better on aricept and pt wants to stop.  Seems to using med calendar well. rec Stop fluticasone and the aricept Dymista one twice daily GERD .     09/16/2012 f/u ov/Allison Rodgers cc pt states having increase sob, wheezing when moving and sitting still. along with heart palp. x 16month - seems worse around 4pm and better p supper >>RAST + /IGE ~319 , CT sinus neg   09/29/2012 Follow u p Pt returns for a 2 week follow up and med review  Her son from [place] is in town. He is trying to help her with her meds  We organized all her meds and updated her med calendar w/ pt education  She has a couple of old/expired  bottles -it is unclear if she is putting new meds into old bottles or they are expired.  She does admit she does not take her fluid pills as recommended.  I emphasized the importance of med compliance.  Son is concerned regarding memory . She does have mild forgetfullness but mostly seems to not process information well sometimes.  Repeated MMSE exam today. Tested well w/ good flow at 30/30 . Clock draw was slightly abn as she wrote the time instead of the hands of clock.   Previous MMSE 03/2012 29/30 . rpr /b12 nml 8/13 .  Labs last ov showed A1C tr up at 8.4.  No polyuria/polydipsia.  Previously started on Aricept 03/2012 . Stoppled 06/2012   12/22/2012 f/u ov/Allison Rodgers re dm/hbp using calendar well  Chief Complaint  Patient presents with  . Follow-up    She c/o numbness and pain in her right arm since Sept 2013. She has been having a hard time picking things up. She also c/o continuing to have black stools and vomiting several times per month.  vomiting sev x per months, assoc with nausea,  black stools daily, no abd pain, maintaining on asa and ppi/hs h2. No wt loss.  Not clear whether has arm/ hand are really weak or numb or just weak due to pain when gripping, persistent daily symptoms snc Sept 2013.  Sleeping ok without nocturnal  or early am exacerbation  of respiratory  c/o's or need for noct saba. Also denies any obvious fluctuation of symptoms with weather or environmental changes or other aggravating or alleviating factors except as outlined above      ROS  The following are not active complaints unless bolded sore throat, dysphagia, dental problems, itching, sneezing,  nasal congestion or excess/ purulent secretions, ear ache,   fever, chills, sweats, unintended wt loss, pleuritic or exertional cp, hemoptysis,  orthopnea pnd or leg swelling, presyncope, palpitations, heartburn, abdominal pain, anorexia, nausea, vomiting, diarrhea  or change in bowel or urinary habits, change in stools or  urine, dysuria,hematuria,  rash, arthralgias, visual complaints, headache, numbness  weakness or ataxia or problems with walking or coordination,  change in mood/affect or memory.                 Allergies    1) Pcn  2) Bactrim      Family History:   asthma in her brother who also has hypertension  mother died of stroke, also had diabetes and arthritis  father had multiple myeloma  4 siblings all in relatively good health  Neg for dementia     Social History:  Patient never smoked.  positive for second-hand smoke exposure  exercises 2-3 times a week  1 cup caffeine a day  married  1 child       Past Medical History:  GERD (ICD-530.81)  ABDOMINAL PAIN, CHRONIC (ICD-789.00)  FATIGUE, CHRONIC (ICD-780.79)  DEGENERATIVE JOINT DISEASE (ICD-715.90)  - L5 radiculopathy confirmed by mri 08/2005  --refer to ortho August 24, 2008 >>> Dr Darrelyn Hillock  HYPERTENSIVE CARDIOVASCULAR DISEASE (ICD-402.90)  OBESITY (ICD-278.00)  - Target wt = 158 for BMI < 30  - Referred [facility] 04/2005  GASTRITIS, CHRONIC (ICD-535.10)  BENIGN NEOPLASM OTH&UNSPEC SITE DIGESTIVE SYSTEM (ICD-211.9)  ESOPHAGEAL STRICTURE (ICD-530.3)..............................................Marland KitchenPatterson  - EGD 12/14/07  HIATAL HERNIA (ICD-553.3)  COLONIC POLYPS, ADENOMATOUS (ICD-211.3)  - Colonoscopy 12/14/07  DIVERTICULOSIS, COLON (ICD-562.10)  DYSLIPIDEMIA (ICD-272.4)  ARTHRITIS (ICD-716.90)  DM (ICD-250.00)  HYPERTENSION (ICD-401.9)  HEALTH MAINTENANCE  - dt 03/2003  - Pneumovax 03/2003  - CPX August 28, 2010  -Mammogram 10/2009, ? abn asymmetry >>repeat ok, last mammo 4/13 >neg  MICROCYTIC ANEMA  - DX 02/03/09, Stool g neg x 6, rx Fe 65 mg /day > improving March 17, 2009  Edema--08/10/08--venous doppler neg for dvt, ?ruptured bakers cyst-right  March 20, 2009 repeat venous doppler > neg bilaterally  May 21, 2010 repeat venous dopplers >> neg bilat  Complex med regimen>>Meds reviewed with pt education and  computerized med calendar September 11, 2010 , 05/10/2011 , 03/18/2012 . 04/29/12 Memory loss  MMSE 03/18/2012 > 29/30 , Clock Draw abn , neg RPR , nml b12 , 09/29/2012 >30/30 , clock abn >refer to neuro   09/29/2012 RAST + w/ IGE at 319                   Objective:   Physical Exam obese ambulatory bf nad  Can no longer get on exam table without assistance with nl vital signs wt 213 10/21/08 > 204 April 26, 2009 > 08/23/2011  191> 11/25/2011  201 > 03/02/2012  193 > 198 03/18/2012 > 186 04/15/2012 >191 05/28/2012 >184 06/08/2012 > 06/18/2012 186 > 189 09/16/2012 > 12/22/2012  198  HEENT: nl dentition, turbinates, and orophanx. Nl external ear canals without cough reflex  Neck without JVD/Nodes/TM  Lungs clear to A and P bilaterally without cough on insp or exp maneuvers  RRR no s3 or murmur or increase in P2,tr -1+ sym bilaterally  Abd soft and benign with nl excursion in the supine position. No bruits or organomegaly   EXT/Skin  :  venous insufficiency changes , no rash  Neuro:  No focal motor deficits - can't grip as well  on R vs L due to pain elicited R wrist     Assessment:        Plan:

## 2012-12-22 NOTE — Assessment & Plan Note (Signed)
Lab Results  Component Value Date   CREATININE 1.6* 12/22/2012   CREATININE 1.3* 09/16/2012   CREATININE 1.3* 06/18/2012     Adequate control on present rx, reviewed rx with lasix prn excess fluids

## 2012-12-24 ENCOUNTER — Encounter: Payer: Self-pay | Admitting: Gastroenterology

## 2012-12-24 ENCOUNTER — Ambulatory Visit (INDEPENDENT_AMBULATORY_CARE_PROVIDER_SITE_OTHER): Payer: Medicare PPO | Admitting: Gastroenterology

## 2012-12-24 VITALS — BP 142/76 | HR 76 | Ht 60.0 in | Wt 196.4 lb

## 2012-12-24 DIAGNOSIS — R112 Nausea with vomiting, unspecified: Secondary | ICD-10-CM | POA: Insufficient documentation

## 2012-12-24 DIAGNOSIS — R109 Unspecified abdominal pain: Secondary | ICD-10-CM

## 2012-12-24 NOTE — Progress Notes (Signed)
12/24/2012 Allison Rodgers [id number] 07-27-1924   HISTORY OF PRESENT ILLNESS:  Patient is a pleasant 79 year old female who is fairly healthy for her age with her main medical issue being her diabetes (diagnosed 6 years ago and recent Hgb A1C of 6.6).  She comes in today with complaints of intermittent nausea and vomiting after eating for the past 4-5 months, but worsening over the past 5 weeks or so.  Vomits undigested food but no blood.  Says that her stools have been dark intermittently as well, but Hgb is normal at 12.0 grams and was the same just 3 months ago.  BUN is normal.  Has epigastric abdominal pain as well.  Denies NSAID use.  Takes a baby ASA every day but no other blood thinners.  Says that appetite is fine and there has not been any weight loss.  Had EGD in 2009 with peptic stricture.  Colonoscopy showed severe diverticulosis and one polyp that was a TA.  She is taking Nexium 40 mg daily and pepcid at bedtime currently.   Past Medical History  Diagnosis Date  . GERD (gastroesophageal reflux disease)   . Chronic abdominal pain   . DJD (degenerative joint disease)   . Hypertensive cardiovascular disease   . Obesity   . Gastritis, chronic   . 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 Esophageal stricture   . Hiatal hernia   . Hx of adenomatous colonic polyps   . Diverticulosis   . Dyslipidemia   . Arthritis   . DM (diabetes mellitus)   . Hypertension   . Microcytic anemia   . Edema   . Renal disorder    Past Surgical History  Procedure Laterality Date  . Appendectomy    . Cholecystectomy  1999  . Vesicovaginal fistula closure w/ tah  1957  . Bilateral oophorectomy  1957  . Hernia repair  08/2005    umbilical and ventral hernia repair by Dr. Jamey Ripa  . Rotator cuff repair  7/05    right  . Back surgery      reports that she has never smoked. She has never used smokeless tobacco. She reports that she does not drink alcohol or use illicit  drugs. family history includes Cancer in her father; Hypertension in her other; and Stroke in her mother. Allergies  Allergen Reactions  . Penicillins Other (See Comments)    REACTION: causes hallucinations  . Sulfamethoxazole W-Trimethoprim Swelling    REACTION: rash  . Bactrim       Outpatient Encounter Prescriptions as of 12/24/2012  Medication Sig Dispense Refill  . acetaminophen (TYLENOL) 500 MG tablet Per bottle as needed for pain      . ALPRAZolam (XANAX) 0.25 MG tablet Take 1 tablet (0.25 mg total) by mouth every 6 (six) hours as needed for anxiety.  120 tablet  2  . aspirin 81 MG chewable tablet Chew 81 mg by mouth daily.        . Attapulgite (KAOPECTATE) 750 MG/15ML LIQD Per bottle      . bimatoprost (LUMIGAN) 0.03 % ophthalmic solution Place 1 drop into both eyes at bedtime.       . Calcium Carbonate-Vitamin D (CALCIUM PLUS VITAMIN D PO) Take 1 tablet by mouth daily.        . dorzolamide (TRUSOPT) 2 % ophthalmic solution Place 1 drop into both eyes at bedtime.      Marland Kitchen esomeprazole (NEXIUM) 40 MG capsule Take 1 capsule (  40 mg total) by mouth daily after breakfast.  30 capsule  5  . famotidine (PEPCID) 20 MG tablet Take 20 mg by mouth at bedtime.      . fluocinonide cream (LIDEX) 0.05 % Apply topically 2 (two) times daily as needed.      . furosemide (LASIX) 40 MG tablet 2 tablets every am and 2 extra if needed for swelling  60 tablet  3  . glimepiride (AMARYL) 2 MG tablet Take 1 tablet (2 mg total) by mouth daily before breakfast.  30 tablet  6  . insulin glargine (LANTUS) 100 UNIT/ML injection Inject 30 Units into the skin at bedtime.      . methylcellulose (CITRUCEL) oral powder 1 tbsp by mouth once daily      . oxyCODONE-acetaminophen (ROXICET) 5-325 MG/5ML solution Take 5 mLs by mouth every 6 (six) hours as needed.       . sodium chloride (OCEAN) 0.65 % nasal spray Place 2 sprays into the nose every 4 (four) hours as needed.       Marland Kitchen spironolactone (ALDACTONE) 50 MG tablet 2  tablets every am and 2 tablets at noon  120 tablet  5  . chlorpheniramine (CHLOR-TRIMETON) 4 MG tablet Take 4 mg by mouth 2 (two) times daily as needed (for nasal drip, drainage).      Marland Kitchen dextromethorphan (TUSSIN COUGH) 15 MG/5ML syrup 1-2 tsp every 4 hours as needed for cough      . [DISCONTINUED] meloxicam (MOBIC) 15 MG tablet Take 15 mg by mouth daily.       No facility-administered encounter medications on file as of 12/24/2012.     REVIEW OF SYSTEMS  : All other systems reviewed and negative except where noted in the History of Present Illness.   PHYSICAL EXAM: BP 142/76  Pulse 76  Ht 5' (1.524 m)  Wt 196 lb 6.4 oz (89.086 kg)  BMI 38.36 kg/m2 General: Well developed black female in no acute distress Head: Normocephalic and atraumatic Eyes:  sclerae anicteric,conjunctive pink. Ears: Normal auditory acuity Lungs: Clear throughout to auscultation Heart: Regular rate and rhythm Abdomen: Soft, non-distended. No masses or hepatomegaly noted. Normal bowel sounds.  Epigastric TTP without R/R/G. Musculoskeletal: Symmetrical with no gross deformities  Skin: No lesions on visible extremities Extremities: No edema  Neurological: Alert oriented x 4, grossly nonfocal Psychological:  Alert and cooperative. Normal mood and affect  ASSESSMENT AND PLAN: -Post-prandial nausea and vomiting and epigastric abdominal pain:  Possibly diabetic gastroparesis, but rule out ulcer vs malignancy and other etiologies as well. -Dark stools:  Intermittent with normal Hgb.  *Will schedule for EGD for further evaluation. *Increase Nexium to BID in the interim.

## 2012-12-24 NOTE — Patient Instructions (Addendum)
You have been scheduled for an endoscopy with propofol. Please follow written instructions given to you at your visit today. If you use inhalers (even only as needed), please bring them with you on the day of your procedure. Your physician has requested that you go to www.startemmi.com and enter the access code given to you at your visit today. This web site gives a general overview about your procedure. However, you should still follow specific instructions given to you by our office regarding your preparation for the procedure.  Increase your Nexium to twice a day  You have been given diabetic Instructions

## 2012-12-25 ENCOUNTER — Telehealth: Payer: Self-pay

## 2012-12-25 NOTE — Telephone Encounter (Signed)
Message copied by Doree Barthel on Fri Dec 25, 2012  3:37 PM ------      Message from: Eugenie Birks      Created: Tue Nov 03, 2012 11:46 AM      Regarding: follow up       Pt needs 3mos followup ------

## 2012-12-25 NOTE — Telephone Encounter (Signed)
Called pt  to sched 3 month, no answer left vmail

## 2012-12-30 ENCOUNTER — Encounter: Payer: Self-pay | Admitting: Gastroenterology

## 2012-12-30 ENCOUNTER — Other Ambulatory Visit: Payer: Self-pay | Admitting: Gastroenterology

## 2012-12-30 ENCOUNTER — Other Ambulatory Visit: Payer: Self-pay | Admitting: *Deleted

## 2012-12-30 ENCOUNTER — Ambulatory Visit (AMBULATORY_SURGERY_CENTER): Payer: Medicare PPO | Admitting: Gastroenterology

## 2012-12-30 VITALS — BP 192/89 | HR 83 | Temp 98.2°F | Resp 17 | Ht 61.0 in | Wt 196.0 lb

## 2012-12-30 DIAGNOSIS — R109 Unspecified abdominal pain: Secondary | ICD-10-CM

## 2012-12-30 DIAGNOSIS — K439 Ventral hernia without obstruction or gangrene: Secondary | ICD-10-CM

## 2012-12-30 DIAGNOSIS — K219 Gastro-esophageal reflux disease without esophagitis: Secondary | ICD-10-CM

## 2012-12-30 DIAGNOSIS — K449 Diaphragmatic hernia without obstruction or gangrene: Secondary | ICD-10-CM

## 2012-12-30 LAB — GLUCOSE, CAPILLARY: Glucose-Capillary: 109 mg/dL — ABNORMAL HIGH (ref 70–99)

## 2012-12-30 MED ORDER — SODIUM CHLORIDE 0.9 % IV SOLN: 500.0000 mL | INTRAVENOUS | Status: DC

## 2012-12-30 NOTE — Progress Notes (Signed)
Patient did not experience any of the following events: a burn prior to discharge; a fall within the facility; wrong site/side/patient/procedure/implant event; or a hospital transfer or hospital admission upon discharge from the facility. (G8907) Patient did not have preoperative order for IV antibiotic SSI prophylaxis. (G8918)  

## 2012-12-30 NOTE — Progress Notes (Signed)
Called to room to assist during endoscopic procedure.  Patient ID and intended procedure confirmed with present staff. Received instructions for my participation in the procedure from the performing physician.  

## 2012-12-30 NOTE — Op Note (Signed)
[facility] [address] [place], [address]   ENDOSCOPY PROCEDURE REPORT  PATIENT: Allison Rodgers, Allison Rodgers  MR#: [id number] BIRTHDATE: 12-22-1923 , 89  yrs. old GENDER: Female ENDOSCOPIST:Berdina Cheever Hale Bogus, MD, [facility] REFERRED BY: PROCEDURE DATE:  12/30/2012 PROCEDURE:   EGD w/ biopsy for H.pylori ASA CLASS:    Class III INDICATIONS: abdominal pain. MEDICATION: propofol (Diprivan) 100mg  IV TOPICAL ANESTHETIC:  DESCRIPTION OF PROCEDURE:   After the risks and benefits of the procedure were explained, informed consent was obtained.  The LB [id number]  endoscope was introduced through the mouth  and advanced to the second portion of the duodenum .  The instrument was slowly withdrawn as the mucosa was fully examined.      DUODENUM: The duodenal mucosa showed no abnormalities in the bulb and second portion of the duodenum.  STOMACH: The mucosa of the stomach appeared normal.  ESOPHAGUS: The mucosa of the esophagus appeared normal.  CLO Bx. done.....  Retroflexed views revealed  large  hiatal hernia.    The scope was then withdrawn from the patient and the procedure completed.  COMPLICATIONS: There were no complications.   ENDOSCOPIC IMPRESSION: 1.   The duodenal mucosa showed no abnormalities in the bulb and second portion of the duodenum 2.   The mucosa of the stomach appeared normal 3.   The mucosa of the esophagus appeared normal ..fairly prominant HH $. Ventral hernia on exam and prior CT scan RECOMMENDATIONS: 1.  Await BRAVO results 2.  Continue current medications 3.Surgical consult if problems persist   _______________________________ eSigned:  Mardella Layman, MD, [facility] 12/30/2012 2:38 PM   standard discharge

## 2012-12-30 NOTE — Progress Notes (Signed)
Pt in recovery for an extended time.  Graciella Freer requested that we hold pt. Until her CT could be scheduled and she could come up and talk with her.

## 2012-12-30 NOTE — Patient Instructions (Addendum)
YOU HAD AN ENDOSCOPIC PROCEDURE TODAY AT THE [facility]: Refer to the procedure report that was given to you for any specific questions about what was found during the examination.  If the procedure report does not answer your questions, please call your gastroenterologist to clarify.  If you requested that your care partner not be given the details of your procedure findings, then the procedure report has been included in a sealed envelope for you to review at your convenience later.  YOU SHOULD EXPECT: Some feelings of bloating in the abdomen. Passage of more gas than usual.  Walking can help get rid of the air that was put into your GI tract during the procedure and reduce the bloating. If you had a lower endoscopy (such as a colonoscopy or flexible sigmoidoscopy) you may notice spotting of blood in your stool or on the toilet paper. If you underwent a bowel prep for your procedure, then you may not have a normal bowel movement for a few days.  DIET: Your first meal following the procedure should be a light meal and then it is ok to progress to your normal diet.  A half-sandwich or bowl of soup is an example of a good first meal.  Heavy or fried foods are harder to digest and may make you feel nauseous or bloated.  Likewise meals heavy in dairy and vegetables can cause extra gas to form and this can also increase the bloating.  Drink plenty of fluids but you should avoid alcoholic beverages for 24 hours.  ACTIVITY: Your care partner should take you home directly after the procedure.  You should plan to take it easy, moving slowly for the rest of the day.  You can resume normal activity the day after the procedure however you should NOT DRIVE or use heavy machinery for 24 hours (because of the sedation medicines used during the test).    SYMPTOMS TO REPORT IMMEDIATELY: A gastroenterologist can be reached at any hour.  During normal business hours, 8:30 AM to 5:00 PM Monday through Friday,  call [phone].  After hours and on weekends, please call the GI answering service at [phone] who will take a message and have the physician on call contact you.    Following upper endoscopy (EGD)  Vomiting of blood or coffee ground material  New chest pain or pain under the shoulder blades  Painful or persistently difficult swallowing  New shortness of breath  Fever of 100F or higher  Black, tarry-looking stools  FOLLOW UP: If any biopsies were taken you will be contacted by phone or by letter within the next 1-3 weeks.  Call your gastroenterologist if you have not heard about the biopsies in 3 weeks.  Our staff will call the home number listed on your records the next business day following your procedure to check on you and address any questions or concerns that you may have at that time regarding the information given to you following your procedure. This is a courtesy call and so if there is no answer at the home number and we have not heard from you through the emergency physician on call, we will assume that you have returned to your regular daily activities without incident.   CT of abdomen and pelvis scheduled for 12/2312 at 10:30 [facility] by Graciella Freer per Dr. Norval Gable order.  Allison Rodgers met with patient and provided Contrast and instructions.  SIGNATURES/CONFIDENTIALITY: You and/or your care partner have signed paperwork which will be  entered into your electronic medical record.  These signatures attest to the fact that that the information above on your After Visit Summary has been reviewed and is understood.  Full responsibility of the confidentiality of this discharge information lies with you and/or your care-partner. 

## 2012-12-31 ENCOUNTER — Telehealth: Payer: Self-pay

## 2012-12-31 LAB — HELICOBACTER PYLORI SCREEN-BIOPSY: UREASE: POSITIVE

## 2012-12-31 NOTE — Telephone Encounter (Signed)
  Follow up Call-  Call back number 12/30/2012  Post procedure Call Back phone  # [phone]  Permission to leave phone message Yes     Patient questions:  Do you have a fever, pain , or abdominal swelling? no Pain Score  0 *  Have you tolerated food without any problems? yes  Have you been able to return to your normal activities? yes  Do you have any questions about your discharge instructions: Diet   no Medications  no Follow up visit  no  Do you have questions or concerns about your Care? no  Actions: * If pain score is 4 or above: No action needed, pain <4.   No problems per the pt. Maw

## 2013-01-01 ENCOUNTER — Telehealth: Payer: Self-pay | Admitting: *Deleted

## 2013-01-01 ENCOUNTER — Ambulatory Visit (INDEPENDENT_AMBULATORY_CARE_PROVIDER_SITE_OTHER)
Admission: RE | Admit: 2013-01-01 | Discharge: 2013-01-01 | Disposition: A | Discharge status: Home / Self Care | Payer: Medicare PPO | Source: Ambulatory Visit | Attending: Gastroenterology | Admitting: Gastroenterology

## 2013-01-01 ENCOUNTER — Encounter: Payer: Self-pay | Admitting: Gastroenterology

## 2013-01-01 DIAGNOSIS — R109 Unspecified abdominal pain: Secondary | ICD-10-CM

## 2013-01-01 MED ORDER — IOHEXOL 300 MG/ML  SOLN
100.0000 mL | Freq: Once | INTRAMUSCULAR | Status: AC | PRN
  Administered 2013-01-01: 80 mL via INTRAVENOUS

## 2013-01-01 MED ORDER — BIS SUBCIT-METRONID-TETRACYC 140-125-125 MG PO CAPS: 3.0000 | ORAL_CAPSULE | Freq: Three times a day (TID) | ORAL | Status: DC

## 2013-01-01 NOTE — Telephone Encounter (Signed)
Informed pt of her + H.Pylori and the need for Pylera since she is allergic to PCN. Pt stated understanding.

## 2013-01-06 ENCOUNTER — Encounter: Payer: Self-pay | Admitting: Internal Medicine

## 2013-01-07 ENCOUNTER — Encounter: Payer: Self-pay | Admitting: Gastroenterology

## 2013-01-07 ENCOUNTER — Ambulatory Visit (INDEPENDENT_AMBULATORY_CARE_PROVIDER_SITE_OTHER): Payer: Medicare PPO | Admitting: Gastroenterology

## 2013-01-07 VITALS — BP 126/60 | HR 79 | Ht 60.0 in | Wt 196.4 lb

## 2013-01-07 DIAGNOSIS — K5730 Diverticulosis of large intestine without perforation or abscess without bleeding: Secondary | ICD-10-CM

## 2013-01-07 DIAGNOSIS — R1032 Left lower quadrant pain: Secondary | ICD-10-CM

## 2013-01-07 DIAGNOSIS — E119 Type 2 diabetes mellitus without complications: Secondary | ICD-10-CM

## 2013-01-07 DIAGNOSIS — Z9049 Acquired absence of other specified parts of digestive tract: Secondary | ICD-10-CM

## 2013-01-07 DIAGNOSIS — A048 Other specified bacterial intestinal infections: Secondary | ICD-10-CM

## 2013-01-07 DIAGNOSIS — Z9089 Acquired absence of other organs: Secondary | ICD-10-CM

## 2013-01-07 NOTE — Patient Instructions (Signed)
Fiber Supplement sheet given today for your review. Please start Benefiber one tablespoon by mouth twice daily with food. Please call in two weeks and ask for Dr. Norval Gable nurse Aram Beecham with a report on how you are feeling.

## 2013-01-07 NOTE — Progress Notes (Signed)
This is a located 78 year old African American female with chronic abdominal pain of unexplained etiology.  Endoscopy recently was unremarkable but she had a positive biopsy for H. pylori, and is on PYLERA therapy.  Previous CT scans that suggested a ventral hernia, her CT scan was repeated and is generally unremarkable except for left colon diverticulosis.  Up-to-date her colonoscopy exams.  Her abdominal gas, bloating, and lower abdominal discomfort been present for many years.  She's had no anorexia, weight loss, hepatobiliary, other upper GI or systemic complaints.  She is on Nexium 40 mg a day, insulin for diabetes, chronic oxycodone, Xanax, Amaryl, Lasix, and Aldactone.  She denies a current cardiovascular pulmonary complaints.  Current Medications, Allergies, Past Medical History, Past Surgical History, Family History and Social History were reviewed in [employer] record.  ROS: All systems were reviewed and are negative unless otherwise stated in the HPI.          Physical Exam: Healthy patient awake and alert.  Blood pressure 126/60, pulse 79 and regular weight 196 with a BMI of 38.36.  I cannot appreciate stigmata of chronic liver disease.  Her abdomen shows obesity without organomegaly, definite masses, and there is slight tenderness to deep palpation left lower quadrant.  Bowel sounds are normal.  Mental status appears normal.      Assessment and Plan: Elderly patient with chronic, chronic abdominal pain probably from symptomatic diverticulosis.  I have asked her to use Benefiber 1 tablespoon twice a day sprinkled into her foods with liberal by mouth fluids.  We will treat her for H. pylori infection hoping that this may improve some of her chronic nonspecific gastrointestinal symptoms.  She is status post cholecystectomy, and had colonoscopy in may of 2009 with severe diverticular disease noted.  Abdominal pain goes on over 15 years on chart review.  She is to  continue other medications as reviewed and listed in her chart.  She will call in 2 weeks' time for progress report. No diagnosis found.

## 2013-01-22 ENCOUNTER — Telehealth: Payer: Self-pay | Admitting: Gastroenterology

## 2013-01-22 NOTE — Telephone Encounter (Signed)
Patient calling with 2 week update for Dr. Jarold Motto. She is feeling better. Still having black stools. States she had nausea x 2 this past week but no vomiting. She states she feels more nervous this week.

## 2013-01-25 NOTE — Telephone Encounter (Signed)
good

## 2013-02-11 ENCOUNTER — Telehealth: Payer: Self-pay | Admitting: Internal Medicine

## 2013-02-11 MED ORDER — FLUOCINONIDE 0.05 % EX CREA: TOPICAL_CREAM | Freq: Two times a day (BID) | CUTANEOUS | Status: DC | PRN

## 2013-02-11 MED ORDER — SALINE NASAL SPRAY 0.65 % NA SOLN: 2.0000 | NASAL | Status: DC | PRN

## 2013-02-11 MED ORDER — ATTAPULGITE 750 MG/15ML PO LIQD: ORAL | Status: DC

## 2013-02-11 MED ORDER — SPIRONOLACTONE 50 MG PO TABS: ORAL_TABLET | ORAL | Status: DC

## 2013-02-11 MED ORDER — ESOMEPRAZOLE MAGNESIUM 40 MG PO CPDR: 40.0000 mg | DELAYED_RELEASE_CAPSULE | Freq: Every day | ORAL | Status: DC

## 2013-02-11 MED ORDER — GLIMEPIRIDE 2 MG PO TABS: 2.0000 mg | ORAL_TABLET | Freq: Every day | ORAL | Status: DC

## 2013-02-11 NOTE — Telephone Encounter (Signed)
Spoke with pt and notified to verify the msg Rxs were sent to pharm

## 2013-02-17 ENCOUNTER — Ambulatory Visit: Payer: Medicare HMO | Admitting: Diagnostic Neuroimaging

## 2013-03-10 ENCOUNTER — Telehealth: Payer: Self-pay | Admitting: Internal Medicine

## 2013-03-10 NOTE — Telephone Encounter (Signed)
I filled out the parts that I could, and placed forms in MW lookat to complete and sign Please send the msg back to me once done

## 2013-03-17 NOTE — Telephone Encounter (Signed)
Forms completed and mailed back to the pt  Pt was already made aware

## 2013-03-17 NOTE — Telephone Encounter (Signed)
Verlon Au, have you seen this yet?

## 2013-03-24 ENCOUNTER — Other Ambulatory Visit (INDEPENDENT_AMBULATORY_CARE_PROVIDER_SITE_OTHER): Payer: Medicare PPO

## 2013-03-24 ENCOUNTER — Ambulatory Visit (INDEPENDENT_AMBULATORY_CARE_PROVIDER_SITE_OTHER): Payer: Medicare PPO | Admitting: Adult Health

## 2013-03-24 ENCOUNTER — Encounter: Payer: Self-pay | Admitting: Adult Health

## 2013-03-24 VITALS — BP 138/86 | HR 64 | Temp 97.3°F | Ht 60.0 in | Wt 183.2 lb

## 2013-03-24 DIAGNOSIS — R109 Unspecified abdominal pain: Secondary | ICD-10-CM

## 2013-03-24 DIAGNOSIS — Z87448 Personal history of other diseases of urinary system: Secondary | ICD-10-CM

## 2013-03-24 DIAGNOSIS — E119 Type 2 diabetes mellitus without complications: Secondary | ICD-10-CM

## 2013-03-24 DIAGNOSIS — R112 Nausea with vomiting, unspecified: Secondary | ICD-10-CM

## 2013-03-24 DIAGNOSIS — K219 Gastro-esophageal reflux disease without esophagitis: Secondary | ICD-10-CM

## 2013-03-24 DIAGNOSIS — R413 Other amnesia: Secondary | ICD-10-CM

## 2013-03-24 DIAGNOSIS — G3184 Mild cognitive impairment, so stated: Secondary | ICD-10-CM

## 2013-03-24 LAB — CBC WITH DIFFERENTIAL/PLATELET
Basophils Relative: 0.4 % (ref 0.0–3.0)
Eosinophils Relative: 1.5 % (ref 0.0–5.0)
Hemoglobin: 13.8 g/dL (ref 12.0–15.0)
Lymphocytes Relative: 20.4 % (ref 12.0–46.0)
Monocytes Relative: 7.4 % (ref 3.0–12.0)
Neutro Abs: 4.5 10*3/uL (ref 1.4–7.7)
Neutrophils Relative %: 70.3 % (ref 43.0–77.0)
RBC: 5.78 Mil/uL — ABNORMAL HIGH (ref 3.87–5.11)
WBC: 6.4 10*3/uL (ref 4.5–10.5)

## 2013-03-24 LAB — URINALYSIS, ROUTINE W REFLEX MICROSCOPIC
Bilirubin Urine: NEGATIVE
Ketones, ur: NEGATIVE
Leukocytes, UA: NEGATIVE
Nitrite: NEGATIVE
Specific Gravity, Urine: 1.01 (ref 1.000–1.030)
Total Protein, Urine: NEGATIVE
pH: 5.5 (ref 5.0–8.0)

## 2013-03-24 LAB — HEPATIC FUNCTION PANEL
ALT: 18 U/L (ref 0–35)
AST: 15 U/L (ref 0–37)
Albumin: 4 g/dL (ref 3.5–5.2)
Total Bilirubin: 0.7 mg/dL (ref 0.3–1.2)

## 2013-03-24 LAB — BASIC METABOLIC PANEL
BUN: 23 mg/dL (ref 6–23)
CO2: 27 mEq/L (ref 19–32)
Chloride: 101 mEq/L (ref 96–112)
Creatinine, Ser: 1.7 mg/dL — ABNORMAL HIGH (ref 0.4–1.2)
Potassium: 4.1 mEq/L (ref 3.5–5.1)

## 2013-03-24 LAB — HEMOGLOBIN A1C: Hgb A1c MFr Bld: 9.9 % — ABNORMAL HIGH (ref 4.6–6.5)

## 2013-03-24 NOTE — Progress Notes (Signed)
Subjective:     Patient ID: Allison Rodgers, female   DOB: [birth year]   MRN: [id number]  Brief patient profile:  66  yobf with morbid obesity complicated by DM, HTN and Dyslipidemia and chronic venous insufficiency with dependent edema.    August 28, 2010 cpx getting all better. limited by legs/hip/ knees no sob or increase leg swelling. >>labs done w/no rx changes   September 11, 2010 --Pt here for follow and med calendar. Last ov with CPX. Labs were w/no significant changes. A1C at 6.7 , s.CR holding at 1.4 . We reviewed all her labs in detail.Urine did show microscopic hematuria . Discussed a DM and low cholestrol diet. She is recovering from a prolonged bronchitis-feeling better but not back to baseline. She has clear drainge and energy level is low. We reviewed all her meds and organized her med calendar.  rec follow calendar   05/10/2011. Follow up and med review.  Seen 2 weeks ago for follow up . Labs showed BS elevated at 300 nonfasting.  Disucssed diet and exercise. Weight is going down with dietary changes.  Will check A1C today to evaluate average control.   We reviewed all her meds today and updated her med calendar w/ pt education  Appears she is taking her meds correctly.   Complains that she has seen small specks of red blood on tissues with wiping after bowel movement   Last colonoscopy was 2009.  No abdominal pain or n/v/d.  rec Follow med calendar closely and bring to each visit.  Use Preparation H Hemorrhoid cream after bowel movement  Low sweet diet.   Flu shot today   07/26/2011 Wert/ ov cc Pt c/o increased edema "from the hips down" x 2 months - taking an extra furosemide as per calendar "maybe once a week" with no benefit (calendar clearly says take one extra daily if needed for swelling).  Denies orthopnea. [place]ed [place] to office on day of ov s doe. Denies salt excess and takes her maintenance doses of furosemide and spironlactone as per calendar  instructions rec Change the as needed furosemide to 40 mg 2 extra daily if needed (can use up to 2 extra daily or a total of 4) Watch the salt and salty food  08/23/2011 f/u ov/Wert cc no better swelling in legs on max doses of furosemide as per calendar, new  Bifrontal L > R bandlike pressure  HA typically after lunch daily x sev weeks, ? viz Floaters not directely related to ha with no nausea  Or sinus complaints, better p tylenol and never in ams rec Please see patient coordinator before you leave today  to schedule venous dopplers> neg bilateral 08/27/11 Stop all caffeine - pay attention to the chronologic pattern of the headache and try alprazolam to see if it helps and call if worsens See [name] for arthritis > did not go     11/25/2011 f/u ov/Wert for multiple chronic medical problems cc worse arthritis esp R back and leg x one year, swelling is some better on present rx reviewed with her on med calendar. No sob, cough. Better control of leg swelling, no overt polydypsia, polyuria, viz changes rec Be sure to see Dr Gladstone Lighter about your back and leg pain Please remember to go to the lab  department downstairs for your tests - we will call you with the results when they are available.   03/02/2012 f/u ov/Wert not using med calendar cc  c/o having slight  HA x 3 months and states "feels bad and weak all of the time". Sugars fasting running as high has 300 fasting on glimepririede 2 mg one half each am, persistent leg swelling despite furosemide and lasix. Admits getting very forgetful re details of care and trying to use the alphabetized epic list for med rec purposes.  HA is generalized, in pms, better with tylenol with no assoc viz or neuro c/os or nausea, always absent in ams >>Change the furosemide to 2 in am and at lunch with additonal 2 at supper if needed  Increase Glimperide to 2 mg each am   03/18/2012 Follow up and MMSE /Med calendar  We reviewed all her meds and organized  them into a med calendar with pt education  She has a lot of old bottles that she says she keeps and put new meds into old bottles  Advised this is not a good idea.  Says her memory is slipping. Gets anxious when she can not remember things. Has gotten lost a couple of times. Son in [place]. She is considering going to live with him or him come here.  Does not want to go to NH.  MMSE scored a 29/30 but slow responses.  Clock draw was very abnormal. With hands of clock outside clock.  We dicussed referral to neuro due to concern for possible dementia  She refuses to go to neuro wants to be treated here.  Labs were unremarkable with neg RPR , nml tsh and nml b12 She denies headache, visual/speech changes.  Does have anxiety at times. No significant depression like symptoms.  Sugars has been running 200-250 . Amaryl was increased 2 weeks ago w/ A1C at 8.4  No low sugars.    04/15/2012 f/u ov/Wert confused with instructions, meds, "son took her list back to [place]" felt worse x 2 weeks with weak and dizziness but no specific pattern (not related to meals).  Fasting bs remain in low 200s  >med review   04/29/12 Follow up and med review     [name] Her last visit. Patient was having difficulty remembering her medications and had misplaced her medication calendar. She is brought all of her medications in today. We reviewed all her medications updated. Her medication calendar with patient education. It does appear that all her medications are up-to-date and have been refilled on time, according to the medication bottles She does easily get confused if you cannot slow down and go over the medications and with slowly We discussed the importance of keeping her list up to date and keep it with her at all times especially when she is seen at the doctor's office Blood sugars are slowly decreasing with her medication adjustments recently She has  had no low sugars She denies any chest pain, shortness of breath, abdominal pain, or increased edema We discussed once again about her memory and referral to neurology. However, she declined Referral  05/28/2012 Acute OV  Complains of right back and hip pain.  pt fell at home 05-25-12, right hip and groin area/right elbow are sore.   reports lost her balance and fell forward landing on right side.  She was able to get herself, and ambulates with minimal pain. Over the last 2 days, has been progressively worsening over and tender along the right side of her lower back into her right hip and right groin area, especially with walking. She denies any urinary symptoms, chest pain, loss of  consciousness, visual or speech changes, extremity weakness, radicular symptoms, or leg swelling >xray w/ no acute process   06/08/2012 Follow up  Returns for follow up for hip and back pain, seen 2 weeks ago after a fall.  Xrays showed no fracture.  Feeling better but still some residual soreness.  Does have some drainage and dry cough for 2 weeks.  rec Alternate ice and heat to back and hip  May use Tylenol arthritis for pain As needed   IF severe pain may use Roxicet As needed  -may cause you to be sleepy and off balance.   06/18/2012 f/u ov/Wert cc  Hips/ back better, no need for narcotic rx.  Memory no better on aricept and pt wants to stop.  Seems to using med calendar well. rec Stop fluticasone and the aricept Dymista one twice daily GERD .     09/16/2012 f/u ov/Wert cc pt states having increase sob, wheezing when moving and sitting still. along with heart palp. x 49month - seems worse around 4pm and better p supper >>RAST + /IGE ~319 , CT sinus neg   09/29/2012 Follow u p Pt returns for a 2 week follow up and med review  Her son from [place] is in town. He is trying to help her with her meds  We organized all her meds and updated her med calendar w/ pt education  She has a couple of old/expired  bottles -it is unclear if she is putting new meds into old bottles or they are expired.  She does admit she does not take her fluid pills as recommended.  I emphasized the importance of med compliance.  Son is concerned regarding memory . She does have mild forgetfullness but mostly seems to not process information well sometimes.  Repeated MMSE exam today. Tested well w/ good flow at 30/30 . Clock draw was slightly abn as she wrote the time instead of the hands of clock.   Previous MMSE 03/2012 29/30 . rpr /b12 nml 8/13 .  Labs last ov showed A1C tr up at 8.4.  No polyuria/polydipsia.  Previously started on Aricept 03/2012 . Stoppled 06/2012   12/22/2012 f/u ov/Wert re dm/hbp using calendar well  Chief Complaint  Patient presents with  . Follow-up    She c/o numbness and pain in her right arm since Sept 2013. She has been having a hard time picking things up. She also c/o continuing to have black stools and vomiting several times per month.  vomiting sev x per months, assoc with nausea,  black stools daily, no abd pain, maintaining on asa and ppi/hs h2. No wt loss.  Not clear whether has arm/ hand are really weak or numb or just weak due to pain when gripping, persistent daily symptoms snc Sept 2013.  >>referral to GI and Neuro   03/24/2013 Follow up  She returns for a follow up office visit.  Seen by Dr. Sharlett Iles earlier this year in May  Underwent endoscopy 12/30/12 that was unremarkable except for H Pylori .  Tx w/ PYLERA therapy. She complains that stomach still upset on/off and dark stools on /off. Last colonscopy 2009 with severe diverticular dz. HBG in 12/2012 was Nml.  Was suppose to follow up with Neurology however appointment was changed .  Continues to have memory issues.  Son is with her today but is leaving to go back home.  She is leaving alone . We went over meds it is obvious that she is unclear of what she  is taking and gets confused easily.  BS at home 100-200 . Does have  polyuria.  No fever, chest pain, dyspnea, edema.  ? taking Mobic on/off.       ROS  The following are not active complaints unless bolded sore throat, dysphagia, dental problems, itching, sneezing,  nasal congestion or excess/ purulent secretions, ear ache,   fever, chills, sweats, unintended wt loss, pleuritic or exertional cp, hemoptysis,  orthopnea pnd or leg swelling, presyncope, palpitations, heartburn, abdominal pain, anorexia,    dysuria,hematuria,  rash, arthralgias, visual complaints, headache, numbness weakness or ataxia or problems with walking or coordination,  change in mood/affect or memory.             Allergies    1) Pcn  2) Bactrim      Family History:   asthma in her brother who also has hypertension  mother died of stroke, also had diabetes and arthritis  father had multiple myeloma  4 siblings all in relatively good health  Neg for dementia     Social History:  Patient never smoked.  positive for second-hand smoke exposure  exercises 2-3 times a week  1 cup caffeine a day  married  1 child       Past Medical History:  GERD (ICD-530.81)  ABDOMINAL PAIN, CHRONIC (ICD-789.00)  FATIGUE, CHRONIC (ICD-780.79)  DEGENERATIVE JOINT DISEASE (ICD-715.90)  - L5 radiculopathy confirmed by mri 08/2005  --refer to ortho August 24, 2008 >>> Dr Darrelyn Hillock  HYPERTENSIVE CARDIOVASCULAR DISEASE (ICD-402.90)  OBESITY (ICD-278.00)  - Target wt = 158 for BMI < 30  - Referred [facility] 04/2005  GASTRITIS, CHRONIC (ICD-535.10)  BENIGN NEOPLASM OTH&UNSPEC SITE DIGESTIVE SYSTEM (ICD-211.9)  ESOPHAGEAL STRICTURE (ICD-530.3)..............................................Marland KitchenPatterson  - EGD 12/14/07  HIATAL HERNIA (ICD-553.3)  COLONIC POLYPS, ADENOMATOUS (ICD-211.3)  - Colonoscopy 12/14/07  DIVERTICULOSIS, COLON (ICD-562.10)  DYSLIPIDEMIA (ICD-272.4)  ARTHRITIS (ICD-716.90)  DM (ICD-250.00)  HYPERTENSION (ICD-401.9)  HEALTH MAINTENANCE  - dt 03/2003  - Pneumovax 03/2003   - CPX August 28, 2010  -Mammogram 10/2009, ? abn asymmetry >>repeat ok, last mammo 4/13 >neg  MICROCYTIC ANEMA  - DX 02/03/09, Stool g neg x 6, rx Fe 65 mg /day > improving March 17, 2009  Edema--08/10/08--venous doppler neg for dvt, ?ruptured bakers cyst-right  March 20, 2009 repeat venous doppler > neg bilaterally  May 21, 2010 repeat venous dopplers >> neg bilat  Complex med regimen>>Meds reviewed with pt education and computerized med calendar September 11, 2010 , 05/10/2011 , 03/18/2012 . 04/29/12 Memory loss  MMSE 03/18/2012 > 29/30 , Clock Draw abn , neg RPR , nml b12 , 09/29/2012 >30/30 , clock abn >refer to neuro   09/29/2012 RAST + w/ IGE at 319        Objective:   Physical Exam obese ambulatory bf nad  Can no longer get on exam table without assistance with nl vital signs wt 213 10/21/08 > 204 April 26, 2009 > 08/23/2011  191> 11/25/2011  201 > 03/02/2012  193 > 198 03/18/2012 > 186 04/15/2012 >191 05/28/2012 >184 06/08/2012 > 06/18/2012 186 > 189 09/16/2012 > 12/22/2012  198 > 183 03/24/2013  HEENT: nl dentition, turbinates, and orophanx. Nl external ear canals without cough reflex  Neck without JVD/Nodes/TM  Lungs clear to A and P bilaterally without cough on insp or exp maneuvers  RRR no s3 or murmur or increase in P2,tr -1+ sym bilaterally  Abd soft and benign with nl excursion in the supine position. No bruits or organomegaly   EXT/Skin  :  venous insufficiency changes , no rash  Neuro:  No focal motor deficits     Assessment:        Plan:

## 2013-03-24 NOTE — Patient Instructions (Addendum)
We will get you back to see Dr. Jarold Motto for ongoing stomach issues.  Stop Meloxicam .  I will call with lab results.  follow up in 6 weeks and As needed   Please contact office for sooner follow up if symptoms do not improve or worsen or seek emergency care

## 2013-03-25 ENCOUNTER — Other Ambulatory Visit: Payer: Self-pay | Admitting: Adult Health

## 2013-03-25 ENCOUNTER — Telehealth: Payer: Self-pay | Admitting: Adult Health

## 2013-03-25 DIAGNOSIS — E119 Type 2 diabetes mellitus without complications: Secondary | ICD-10-CM

## 2013-03-25 DIAGNOSIS — G3184 Mild cognitive impairment, so stated: Secondary | ICD-10-CM

## 2013-03-25 MED ORDER — INSULIN GLARGINE 100 UNIT/ML ~~LOC~~ SOLN: 40.0000 [IU] | Freq: Every day | SUBCUTANEOUS | Status: DC

## 2013-03-25 MED ORDER — SITAGLIPTIN PHOSPHATE 50 MG PO TABS: 50.0000 mg | ORAL_TABLET | Freq: Every day | ORAL | Status: DC

## 2013-03-25 NOTE — Telephone Encounter (Signed)
Labs called with BS 559 Pt aware ,  BS this am 145, with no n/v//d  Will add rx Januvia 50mg  daily and increaase lantus 40 units daily  Ov in 1 week  Call if BS >250  Or <80 ER if n/v/d Refer to endocrinology  Please contact office for sooner follow up if symptoms do not improve or worsen or seek emergency care Son notified , advised pt needs more in home care with medication supervision.

## 2013-03-25 NOTE — Assessment & Plan Note (Signed)
Advised on diet   Plan  Labs pending

## 2013-03-25 NOTE — Progress Notes (Signed)
Quick Note:  Orders only encounter created for endocrinology referral, referral to [facility] for sooner follow up. Appt scheduled with MW for 8.20.14 @ 1045 with STAT BMET prior. Pt's son is aware and verbalized understanding that we will call with referral appts. ______

## 2013-03-25 NOTE — Progress Notes (Signed)
Result Notes    Notes Recorded by Julio Sicks, NP on 03/25/2013 at 10:44 AM Spoke with pt, BS this am 145  Eating well with no vomitting.  Advised will refer to DM specialist -endocrinology  Add Januvia 50mg  daily  Increase Lantus 40units daily  Ov in 1 week with Dr. [name]  ER if not eating, n/v/d .  Call if BS is >250 .   ------  Notes Recorded by Julio Sicks, NP on 03/25/2013 at 10:38 AM BS is very high  Very unclear yesterday if she was taking her meds  Will contact pt and son immediately and adjust meds  Needs ov next week with Dr. [name] For BS check If n/v will need to call back or go to ER .     Referrals placed  Will call son to schedule appt with MW next week w/ STAT BMET prior Son also needs to be reminded of pt's upcoming appt with Dr Odetta Pink [phone]  Called spoke with Ardelle Lesches that we are working on the appts with [facility] and Endocrinology Appt scheduled with MW 8.20.14 @ 1045, pt to arrive 30 mins early for STAT BMET (order placed) Nothing further needed at this time; will sign off. Boone Master 4:18 PM

## 2013-03-25 NOTE — Assessment & Plan Note (Signed)
Cont w/ brain teasers Advised son on safety at home -pt needs to consider assisted living or in home assistance Refer back to Neuro , for consideration of therapy.

## 2013-03-25 NOTE — Assessment & Plan Note (Addendum)
Chronic Nausea ? Etiology with ongoing dark stools  Refer back to GI for evaluation  Last colon 2009  Stop mobic

## 2013-03-26 LAB — URINE CULTURE: Colony Count: 45000

## 2013-03-30 ENCOUNTER — Telehealth: Payer: Self-pay | Admitting: Internal Medicine

## 2013-03-30 ENCOUNTER — Inpatient Hospital Stay (HOSPITAL_COMMUNITY)
Admission: EM | Admit: 2013-03-30 | Discharge: 2013-04-05 | DRG: 094 | Disposition: A | Discharge status: Skilled Nursing Facility | Payer: Medicare PPO | Attending: Internal Medicine | Admitting: Internal Medicine

## 2013-03-30 ENCOUNTER — Encounter (HOSPITAL_COMMUNITY): Payer: Self-pay | Admitting: *Deleted

## 2013-03-30 ENCOUNTER — Emergency Department (HOSPITAL_COMMUNITY): Payer: Medicare PPO

## 2013-03-30 ENCOUNTER — Inpatient Hospital Stay (HOSPITAL_COMMUNITY): Payer: Medicare PPO

## 2013-03-30 ENCOUNTER — Ambulatory Visit: Payer: Medicare PPO | Admitting: Gastroenterology

## 2013-03-30 DIAGNOSIS — K2940 Chronic atrophic gastritis without bleeding: Secondary | ICD-10-CM | POA: Diagnosis present

## 2013-03-30 DIAGNOSIS — E119 Type 2 diabetes mellitus without complications: Secondary | ICD-10-CM

## 2013-03-30 DIAGNOSIS — R4182 Altered mental status, unspecified: Secondary | ICD-10-CM

## 2013-03-30 DIAGNOSIS — G039 Meningitis, unspecified: Secondary | ICD-10-CM

## 2013-03-30 DIAGNOSIS — N179 Acute kidney failure, unspecified: Secondary | ICD-10-CM | POA: Diagnosis present

## 2013-03-30 DIAGNOSIS — E785 Hyperlipidemia, unspecified: Secondary | ICD-10-CM | POA: Diagnosis present

## 2013-03-30 DIAGNOSIS — E861 Hypovolemia: Secondary | ICD-10-CM | POA: Diagnosis present

## 2013-03-30 DIAGNOSIS — E1029 Type 1 diabetes mellitus with other diabetic kidney complication: Secondary | ICD-10-CM | POA: Diagnosis present

## 2013-03-30 DIAGNOSIS — D139 Benign neoplasm of ill-defined sites within the digestive system: Secondary | ICD-10-CM

## 2013-03-30 DIAGNOSIS — G8929 Other chronic pain: Secondary | ICD-10-CM | POA: Diagnosis present

## 2013-03-30 DIAGNOSIS — D1399 Benign neoplasm of ill-defined sites within the digestive system: Secondary | ICD-10-CM

## 2013-03-30 DIAGNOSIS — M1990 Unspecified osteoarthritis, unspecified site: Secondary | ICD-10-CM | POA: Diagnosis present

## 2013-03-30 DIAGNOSIS — I10 Essential (primary) hypertension: Secondary | ICD-10-CM

## 2013-03-30 DIAGNOSIS — E1065 Type 1 diabetes mellitus with hyperglycemia: Secondary | ICD-10-CM | POA: Diagnosis present

## 2013-03-30 DIAGNOSIS — Z79899 Other long term (current) drug therapy: Secondary | ICD-10-CM

## 2013-03-30 DIAGNOSIS — N184 Chronic kidney disease, stage 4 (severe): Secondary | ICD-10-CM | POA: Diagnosis present

## 2013-03-30 DIAGNOSIS — Z6834 Body mass index (BMI) 34.0-34.9, adult: Secondary | ICD-10-CM

## 2013-03-30 DIAGNOSIS — G9340 Encephalopathy, unspecified: Secondary | ICD-10-CM

## 2013-03-30 DIAGNOSIS — A89 Unspecified viral infection of central nervous system: Secondary | ICD-10-CM | POA: Diagnosis present

## 2013-03-30 DIAGNOSIS — J209 Acute bronchitis, unspecified: Secondary | ICD-10-CM

## 2013-03-30 DIAGNOSIS — D509 Iron deficiency anemia, unspecified: Secondary | ICD-10-CM | POA: Diagnosis present

## 2013-03-30 DIAGNOSIS — K222 Esophageal obstruction: Secondary | ICD-10-CM

## 2013-03-30 DIAGNOSIS — Z7982 Long term (current) use of aspirin: Secondary | ICD-10-CM

## 2013-03-30 DIAGNOSIS — E1110 Type 2 diabetes mellitus with ketoacidosis without coma: Secondary | ICD-10-CM

## 2013-03-30 DIAGNOSIS — K219 Gastro-esophageal reflux disease without esophagitis: Secondary | ICD-10-CM

## 2013-03-30 DIAGNOSIS — I1310 Hypertensive heart and chronic kidney disease without heart failure, with stage 1 through stage 4 chronic kidney disease, or unspecified chronic kidney disease: Secondary | ICD-10-CM | POA: Diagnosis present

## 2013-03-30 DIAGNOSIS — T502X5A Adverse effect of carbonic-anhydrase inhibitors, benzothiadiazides and other diuretics, initial encounter: Secondary | ICD-10-CM | POA: Diagnosis not present

## 2013-03-30 DIAGNOSIS — Z794 Long term (current) use of insulin: Secondary | ICD-10-CM

## 2013-03-30 DIAGNOSIS — R109 Unspecified abdominal pain: Secondary | ICD-10-CM

## 2013-03-30 DIAGNOSIS — E1010 Type 1 diabetes mellitus with ketoacidosis without coma: Secondary | ICD-10-CM | POA: Diagnosis present

## 2013-03-30 DIAGNOSIS — J9819 Other pulmonary collapse: Secondary | ICD-10-CM | POA: Diagnosis present

## 2013-03-30 DIAGNOSIS — G009 Bacterial meningitis, unspecified: Principal | ICD-10-CM | POA: Diagnosis present

## 2013-03-30 DIAGNOSIS — F0390 Unspecified dementia without behavioral disturbance: Secondary | ICD-10-CM

## 2013-03-30 DIAGNOSIS — N058 Unspecified nephritic syndrome with other morphologic changes: Secondary | ICD-10-CM | POA: Diagnosis present

## 2013-03-30 DIAGNOSIS — G3184 Mild cognitive impairment, so stated: Secondary | ICD-10-CM

## 2013-03-30 DIAGNOSIS — I2510 Atherosclerotic heart disease of native coronary artery without angina pectoris: Secondary | ICD-10-CM | POA: Diagnosis present

## 2013-03-30 DIAGNOSIS — N183 Chronic kidney disease, stage 3 unspecified: Secondary | ICD-10-CM

## 2013-03-30 DIAGNOSIS — A419 Sepsis, unspecified organism: Secondary | ICD-10-CM

## 2013-03-30 DIAGNOSIS — E876 Hypokalemia: Secondary | ICD-10-CM | POA: Diagnosis not present

## 2013-03-30 LAB — CBC WITH DIFFERENTIAL/PLATELET
Eosinophils Relative: 0 % (ref 0–5)
HCT: 48.1 % — ABNORMAL HIGH (ref 36.0–46.0)
Lymphocytes Relative: 4 % — ABNORMAL LOW (ref 12–46)
Lymphs Abs: 0.4 10*3/uL — ABNORMAL LOW (ref 0.7–4.0)
MCV: 72.9 fL — ABNORMAL LOW (ref 78.0–100.0)
Neutro Abs: 9.6 10*3/uL — ABNORMAL HIGH (ref 1.7–7.7)
Platelets: 144 10*3/uL — ABNORMAL LOW (ref 150–400)
RBC: 6.6 MIL/uL — ABNORMAL HIGH (ref 3.87–5.11)
WBC: 10.2 10*3/uL (ref 4.0–10.5)

## 2013-03-30 LAB — PROTIME-INR
INR: 1.14 (ref 0.00–1.49)
Prothrombin Time: 14.4 seconds (ref 11.6–15.2)

## 2013-03-30 LAB — URINALYSIS, ROUTINE W REFLEX MICROSCOPIC
Bilirubin Urine: NEGATIVE
Glucose, UA: >1000 mg/dL — AB
Ketones, ur: 40 mg/dL — AB
Leukocytes, UA: NEGATIVE
Nitrite: NEGATIVE
Protein, ur: 30 mg/dL — AB
Specific Gravity, Urine: 1.031 — ABNORMAL HIGH (ref 1.005–1.030)
Urobilinogen, UA: 0.2 mg/dL (ref 0.0–1.0)
pH: 5 (ref 5.0–8.0)

## 2013-03-30 LAB — BASIC METABOLIC PANEL
CO2: 24 mEq/L (ref 19–32)
Calcium: 10 mg/dL (ref 8.4–10.5)
Chloride: 99 mEq/L (ref 96–112)
GFR calc Af Amer: 35 mL/min — ABNORMAL LOW (ref >90)
GFR calc non Af Amer: 26 mL/min — ABNORMAL LOW (ref >90)
GFR calc non Af Amer: 30 mL/min — ABNORMAL LOW (ref >90)
Potassium: 4.1 mEq/L (ref 3.5–5.1)
Potassium: 3.9 mEq/L (ref 3.5–5.1)
Sodium: 133 mEq/L — ABNORMAL LOW (ref 135–145)
Sodium: 135 mEq/L (ref 135–145)

## 2013-03-30 LAB — CBC
HCT: 48.1 % — ABNORMAL HIGH (ref 36.0–46.0)
Hemoglobin: 15.8 g/dL — ABNORMAL HIGH (ref 12.0–15.0)
RBC: 6.63 MIL/uL — ABNORMAL HIGH (ref 3.87–5.11)

## 2013-03-30 LAB — COMPREHENSIVE METABOLIC PANEL
ALT: 13 U/L (ref 0–35)
AST: 20 U/L (ref 0–37)
Albumin: 4.3 g/dL (ref 3.5–5.2)
Alkaline Phosphatase: 113 U/L (ref 39–117)
CO2: 22 mEq/L (ref 19–32)
Chloride: 88 mEq/L — ABNORMAL LOW (ref 96–112)
Creatinine, Ser: 1.76 mg/dL — ABNORMAL HIGH (ref 0.50–1.10)
GFR calc non Af Amer: 25 mL/min — ABNORMAL LOW (ref >90)
Potassium: 4.1 mEq/L (ref 3.5–5.1)
Total Bilirubin: 0.7 mg/dL (ref 0.3–1.2)

## 2013-03-30 LAB — CG4 I-STAT (LACTIC ACID): Lactic Acid, Venous: 3.93 mmol/L — ABNORMAL HIGH (ref 0.5–2.2)

## 2013-03-30 LAB — URINE MICROSCOPIC-ADD ON

## 2013-03-30 LAB — GLUCOSE, CAPILLARY: Glucose-Capillary: 282 mg/dL — ABNORMAL HIGH (ref 70–99)

## 2013-03-30 LAB — TROPONIN I: Troponin I: <0.3 ng/mL (ref <0.30)

## 2013-03-30 LAB — AMMONIA: Ammonia: 20 umol/L (ref 11–60)

## 2013-03-30 LAB — LACTIC ACID, PLASMA: Lactic Acid, Venous: 3.8 mmol/L — ABNORMAL HIGH (ref 0.5–2.2)

## 2013-03-30 LAB — MRSA PCR SCREENING: MRSA by PCR: NEGATIVE

## 2013-03-30 MED ORDER — MIDAZOLAM HCL 2 MG/2ML IJ SOLN: 1.0000 mg | Freq: Once | INTRAMUSCULAR | Status: AC

## 2013-03-30 MED ORDER — POTASSIUM CHLORIDE 10 MEQ/100ML IV SOLN
10.0000 meq | Freq: Once | INTRAVENOUS | Status: AC
  Administered 2013-03-30: 10 meq via INTRAVENOUS
  Filled 2013-03-30: qty 100

## 2013-03-30 MED ORDER — SODIUM CHLORIDE 0.9 % IV SOLN
INTRAVENOUS | Status: DC
  Administered 2013-03-30: 6.7 [IU]/h via INTRAVENOUS
  Administered 2013-03-30: 3.7 [IU]/h via INTRAVENOUS
  Filled 2013-03-30: qty 1

## 2013-03-30 MED ORDER — FENTANYL CITRATE 0.05 MG/ML IJ SOLN
INTRAMUSCULAR | Status: AC
  Filled 2013-03-30: qty 2

## 2013-03-30 MED ORDER — LEVOFLOXACIN IN D5W 750 MG/150ML IV SOLN: 750.0000 mg | INTRAVENOUS | Status: DC

## 2013-03-30 MED ORDER — DEXTROSE-NACL 5-0.45 % IV SOLN
INTRAVENOUS | Status: DC
  Administered 2013-03-30: 18:00:00 via INTRAVENOUS

## 2013-03-30 MED ORDER — FENTANYL CITRATE 0.05 MG/ML IJ SOLN
100.0000 ug | Freq: Once | INTRAMUSCULAR | Status: AC
  Administered 2013-03-30: 100 ug via INTRAVENOUS

## 2013-03-30 MED ORDER — SODIUM CHLORIDE 0.9 % IV SOLN
1000.0000 mL | INTRAVENOUS | Status: DC
  Administered 2013-03-30 – 2013-04-01 (×2): 1000 mL via INTRAVENOUS

## 2013-03-30 MED ORDER — SODIUM CHLORIDE 0.9 % IV SOLN
INTRAVENOUS | Status: DC
  Administered 2013-03-30: 18:00:00 via INTRAVENOUS

## 2013-03-30 MED ORDER — SODIUM CHLORIDE 0.9 % IV SOLN
400.0000 mg | Freq: Once | INTRAVENOUS | Status: AC
  Administered 2013-03-30: 400 mg via INTRAVENOUS
  Filled 2013-03-30: qty 4

## 2013-03-30 MED ORDER — DEXTROSE 5 % IV SOLN
2.0000 g | Freq: Once | INTRAVENOUS | Status: AC
  Administered 2013-03-30: 2 g via INTRAVENOUS
  Filled 2013-03-30: qty 2

## 2013-03-30 MED ORDER — MIDAZOLAM HCL 2 MG/2ML IJ SOLN
1.0000 mg | Freq: Once | INTRAMUSCULAR | Status: AC
  Administered 2013-03-30: 1 mg via INTRAVENOUS

## 2013-03-30 MED ORDER — VANCOMYCIN HCL IN DEXTROSE 1-5 GM/200ML-% IV SOLN
1000.0000 mg | INTRAVENOUS | Status: DC
  Administered 2013-03-31 – 2013-04-05 (×6): 1000 mg via INTRAVENOUS
  Filled 2013-03-30 (×6): qty 200

## 2013-03-30 MED ORDER — VANCOMYCIN HCL IN DEXTROSE 1-5 GM/200ML-% IV SOLN
1000.0000 mg | Freq: Once | INTRAVENOUS | Status: AC
  Administered 2013-03-30: 1000 mg via INTRAVENOUS
  Filled 2013-03-30: qty 200

## 2013-03-30 MED ORDER — SODIUM CHLORIDE 0.9 % IV SOLN
250.0000 mL | INTRAVENOUS | Status: DC | PRN
  Administered 2013-04-04: 250 mL via INTRAVENOUS

## 2013-03-30 MED ORDER — DEXAMETHASONE SODIUM PHOSPHATE 10 MG/ML IJ SOLN
10.0000 mg | Freq: Four times a day (QID) | INTRAMUSCULAR | Status: DC
  Administered 2013-03-30 – 2013-04-01 (×6): 10 mg via INTRAVENOUS
  Filled 2013-03-30 (×11): qty 1

## 2013-03-30 MED ORDER — DEXTROSE 5 % IV SOLN
2.0000 g | Freq: Two times a day (BID) | INTRAVENOUS | Status: DC
  Administered 2013-03-31 – 2013-04-05 (×10): 2 g via INTRAVENOUS
  Filled 2013-03-30 (×13): qty 2

## 2013-03-30 MED ORDER — DEXTROSE 5 % IV SOLN
480.0000 mg | Freq: Once | INTRAVENOUS | Status: AC
  Administered 2013-03-30: 480 mg via INTRAVENOUS
  Filled 2013-03-30: qty 9.6

## 2013-03-30 MED ORDER — SODIUM CHLORIDE 0.9 % IV SOLN
INTRAVENOUS | Status: DC
  Administered 2013-03-30 – 2013-03-31 (×2): via INTRAVENOUS

## 2013-03-30 MED ORDER — AZTREONAM 1 G IJ SOLR
1.0000 g | Freq: Three times a day (TID) | INTRAMUSCULAR | Status: DC
  Filled 2013-03-30: qty 1

## 2013-03-30 MED ORDER — DEXTROSE 50 % IV SOLN: 25.0000 mL | INTRAVENOUS | Status: DC | PRN

## 2013-03-30 MED ORDER — DEXTROSE-NACL 5-0.45 % IV SOLN: INTRAVENOUS | Status: DC

## 2013-03-30 MED ORDER — DEXTROSE 5 % IV SOLN: 2.0000 g | INTRAVENOUS | Status: DC

## 2013-03-30 MED ORDER — SODIUM CHLORIDE 0.9 % IV SOLN: INTRAVENOUS | Status: DC

## 2013-03-30 MED ORDER — LEVOFLOXACIN IN D5W 750 MG/150ML IV SOLN
750.0000 mg | Freq: Once | INTRAVENOUS | Status: DC
  Filled 2013-03-30: qty 150

## 2013-03-30 MED ORDER — MIDAZOLAM HCL 2 MG/2ML IJ SOLN
INTRAMUSCULAR | Status: AC
  Filled 2013-03-30: qty 2

## 2013-03-30 MED ORDER — MIDAZOLAM HCL 2 MG/2ML IJ SOLN
INTRAMUSCULAR | Status: AC
  Administered 2013-03-30: 1 mg
  Filled 2013-03-30: qty 2

## 2013-03-30 MED ORDER — SODIUM CHLORIDE 0.9 % IV SOLN
INTRAVENOUS | Status: AC
  Administered 2013-03-30: 18:00:00 via INTRAVENOUS

## 2013-03-30 MED ORDER — POTASSIUM CHLORIDE 10 MEQ/100ML IV SOLN
10.0000 meq | INTRAVENOUS | Status: AC
  Administered 2013-03-30: 10 meq via INTRAVENOUS
  Filled 2013-03-30 (×2): qty 100

## 2013-03-30 MED ORDER — DEXTROSE 5 % IV SOLN: 1.0000 g | INTRAVENOUS | Status: DC

## 2013-03-30 MED ORDER — DEXAMETHASONE SODIUM PHOSPHATE 10 MG/ML IJ SOLN
10.0000 mg | Freq: Once | INTRAMUSCULAR | Status: AC
  Administered 2013-03-30: 10 mg via INTRAVENOUS
  Filled 2013-03-30: qty 1

## 2013-03-30 MED ORDER — SODIUM CHLORIDE 0.9 % IV SOLN
1000.0000 mL | Freq: Once | INTRAVENOUS | Status: AC
  Administered 2013-03-30: 1000 mL via INTRAVENOUS

## 2013-03-30 NOTE — ED Provider Notes (Addendum)
CSN: [id number]     Arrival date & time 03/30/13  1131 History     First MD Initiated Contact with Patient 03/30/13 1214     Chief Complaint  Patient presents with  . Altered Mental Status    HPI Level V caveat: Altered mental status  Patient is brought to the emergency department for altered mental status.  She was noted to be normal yesterday.  Family called to check on her today and she did not answer and when a family member showed she was confused and altered and more lethargic.  Normally she is functional at home and takes care of all of her own ADLs.  No recent illness that the family is made aware of.  She does not drink alcohol per the family.  No drug use per the family.  Patient is unable to answer any questions during the history.  Patient responds only to loud voice or painful stimuli.  Her records demonstrate a history of chronic abdominal pain, diabetes, hypertension, renal insufficiency, morbid obesity Past Medical History  Diagnosis Date  . GERD (gastroesophageal reflux disease)   . Chronic abdominal pain   . DJD (degenerative joint disease)   . Hypertensive cardiovascular disease   . Obesity   . Gastritis, chronic   . 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 Esophageal stricture   . Hiatal hernia   . Hx of adenomatous colonic polyps   . Diverticulosis   . Dyslipidemia   . Arthritis   . DM (diabetes mellitus)   . Hypertension   . Microcytic anemia   . Edema   . Renal disorder    Past Surgical History  Procedure Laterality Date  . Appendectomy    . Cholecystectomy  1999  . Vesicovaginal fistula closure w/ tah  1957  . Bilateral oophorectomy  1957  . Hernia repair  08/2005    umbilical and ventral hernia repair by Dr. Jamey Ripa  . Rotator cuff repair  7/05    right  . Back surgery     Family History  Problem Relation Age of Onset  . Stroke Mother   . Cancer Father   . Hypertension Other    History  Substance Use Topics   . Smoking status: Never Smoker   . Smokeless tobacco: Never Used  . Alcohol Use: No   OB History   Grav Para Term Preterm Abortions TAB SAB Ect Mult Living                 Review of Systems  Unable to perform ROS: Mental status change    Allergies  Penicillins; Sulfamethoxazole w-trimethoprim; and Bactrim  Home Medications   Current Outpatient Rx  Name  Route  Sig  Dispense  Refill  . acetaminophen (TYLENOL) 500 MG tablet      500-1,000 mg every 6 (six) hours as needed for pain. Per bottle as needed for pain         . ALPRAZolam (XANAX) 0.25 MG tablet   Oral   Take 0.25 mg by mouth every 6 (six) hours as needed for anxiety.         Marland Kitchen aspirin 81 MG chewable tablet   Oral   Chew 81 mg by mouth daily.           . bimatoprost (LUMIGAN) 0.03 % ophthalmic solution   Both Eyes   Place 1 drop into both eyes at bedtime.          Marland Kitchen  Calcium Carbonate-Vitamin D (CALCIUM PLUS VITAMIN D PO)   Oral   Take 1 tablet by mouth daily.           . chlorpheniramine (CHLOR-TRIMETON) 4 MG tablet   Oral   Take 4 mg by mouth 2 (two) times daily as needed (for nasal drip, drainage).         Marland Kitchen dextromethorphan (TUSSIN COUGH) 15 MG/5ML syrup      1-2 tsp every 4 hours as needed for cough         . dorzolamide (TRUSOPT) 2 % ophthalmic solution   Both Eyes   Place 1 drop into both eyes at bedtime.         Marland Kitchen esomeprazole (NEXIUM) 40 MG capsule   Oral   Take 1 capsule (40 mg total) by mouth daily after breakfast.   30 capsule   5   . famotidine (PEPCID) 20 MG tablet   Oral   Take 20 mg by mouth at bedtime.         . fluocinonide cream (LIDEX) 0.05 %   Topical   Apply 1 application topically 2 (two) times daily as needed (rash).         . furosemide (LASIX) 40 MG tablet   Oral   Take 80 mg by mouth See admin instructions. Two tabs (80mg ) every morning, two tabs (80mg ) extra if swelling         . glimepiride (AMARYL) 2 MG tablet   Oral   Take 2 mg by mouth  daily with breakfast.         . insulin glargine (LANTUS) 100 UNIT/ML injection   Subcutaneous   Inject 30 Units into the skin at bedtime.         . methylcellulose (CITRUCEL) oral powder      1 tbsp by mouth once daily         . oxyCODONE-acetaminophen (ROXICET) 5-325 MG/5ML solution   Oral   Take 5 mLs by mouth every 6 (six) hours as needed.          . sitaGLIPtin (JANUVIA) 50 MG tablet   Oral   Take 50 mg by mouth daily.         . sodium chloride (OCEAN) 0.65 % nasal spray   Nasal   Place 2 sprays into the nose as needed for congestion.         Marland Kitchen spironolactone (ALDACTONE) 50 MG tablet   Oral   Take 100 mg by mouth See admin instructions. Two tabs in morning and two tabs at noon          BP 182/66  Pulse 96  Temp(Src) 102 F (38.9 C) (Rectal)  Resp 24  SpO2 95% Physical Exam  Nursing note and vitals reviewed. Constitutional: She appears well-developed and well-nourished. No distress.  HENT:  Head: Normocephalic and atraumatic.  Eyes: EOM are normal.  Neck: Normal range of motion. Neck supple.  No neck rigidity  Cardiovascular: Normal rate, regular rhythm and normal heart sounds.   Pulmonary/Chest: Effort normal and breath sounds normal.  Abdominal: Soft. She exhibits no distension. There is no tenderness. There is no rebound and no guarding.  Musculoskeletal: Normal range of motion.  Neurological:  Responds only to loud voice and painful stimuli.  Localizes to pain.  Skin: Skin is warm and dry. No rash noted. No erythema. No pallor.  Psychiatric: She has a normal mood and affect. Judgment normal.    ED Course   Procedures (including  critical care time)  CRITICAL CARE Performed by: Lyanne Co Total critical care time: 35 Critical care time was exclusive of separately billable procedures and treating other patients. Critical care was necessary to treat or prevent imminent or life-threatening deterioration. Critical care was time spent  personally by me on the following activities: development of treatment plan with patient and/or surrogate as well as nursing, discussions with consultants, evaluation of patient's response to treatment, examination of patient, obtaining history from patient or surrogate, ordering and performing treatments and interventions, ordering and review of laboratory studies, ordering and review of radiographic studies, pulse oximetry and re-evaluation of patient's condition.    Date: 03/30/2013  Rate: 101  Rhythm: normal sinus rhythm  QRS Axis: normal  Intervals: normal  ST/T Wave abnormalities: normal  Conduction Disutrbances: none  Narrative Interpretation:   Old EKG Reviewed: No significant changes noted      Labs Reviewed  URINALYSIS, ROUTINE W REFLEX MICROSCOPIC - Abnormal; Notable for the following:    APPearance CLOUDY (*)    Specific Gravity, Urine 1.031 (*)    Glucose, UA >1000 (*)    Hgb urine dipstick MODERATE (*)    Ketones, ur 40 (*)    Protein, ur 30 (*)    All other components within normal limits  CBC WITH DIFFERENTIAL - Abnormal; Notable for the following:    RBC 6.60 (*)    Hemoglobin 15.6 (*)    HCT 48.1 (*)    MCV 72.9 (*)    MCH 23.6 (*)    Platelets 144 (*)    Neutrophils Relative % 94 (*)    Neutro Abs 9.6 (*)    Lymphocytes Relative 4 (*)    Lymphs Abs 0.4 (*)    Monocytes Relative 2 (*)    All other components within normal limits  COMPREHENSIVE METABOLIC PANEL - Abnormal; Notable for the following:    Sodium 133 (*)    Chloride 88 (*)    Glucose, Bld 430 (*)    BUN 38 (*)    Creatinine, Ser 1.76 (*)    Calcium 10.6 (*)    Total Protein 9.2 (*)    GFR calc non Af Amer 25 (*)    GFR calc Af Amer 29 (*)    All other components within normal limits  URINE MICROSCOPIC-ADD ON - Abnormal; Notable for the following:    Bacteria, UA FEW (*)    All other components within normal limits  CG4 I-STAT (LACTIC ACID) - Abnormal; Notable for the following:     Lactic Acid, Venous 3.93 (*)    All other components within normal limits  CULTURE, BLOOD (ROUTINE X 2)  CULTURE, BLOOD (ROUTINE X 2)  URINE CULTURE  TROPONIN I  PROTIME-INR  LACTIC ACID, PLASMA  ETHANOL  AMMONIA   Dg Chest 2 View  03/30/2013   *RADIOLOGY REPORT*  Clinical Data: Altered mental status with weakness.  CHEST - 2 VIEW  Comparison: 08/28/2010.  Findings: Trachea is midline.  Heart size stable.  Thoracic aorta is calcified.  Minimal bibasilar atelectasis or scarring.  No pleural fluid. Left hemidiaphragm is mildly elevated, as before.  IMPRESSION: Minimal bibasilar atelectasis or scarring.   Original Report Authenticated By: Leanna Battles, M.D.   Ct Head Wo Contrast  03/30/2013   *RADIOLOGY REPORT*  Clinical Data: Altered mental status  CT HEAD WITHOUT CONTRAST  Technique:  Contiguous axial images were obtained from the base of the skull through the vertex without contrast.  Comparison: MRI 11/14/2012  Findings:  Moderately severe generalized atrophy, unchanged.  No acute infarct.  Negative for hemorrhage or mass.  No midline shift. No skull lesion identified.  IMPRESSION: Moderate atrophy.  No acute abnormality.   Original Report Authenticated By: Janeece Riggers, M.D.   I personally reviewed the imaging tests through PACS system I reviewed available ER/hospitalization records through the EMR   1. Sepsis   2. Altered mental status     MDM  Patient presents with altered mental status and fever 202.  This is sepsis without an etiology.  Chest x-ray and urine show no signs of infection.  Skin demonstrates no signs of rash or cellulitis.  Given the altered mental status elevated lactate and fever I am concerned about the possibility of encephalitis versus meningitis.  Patient started on broad-spectrum antibiotics on arrival.  The patient will be admitted to the intensive care unit.  The critical care team has stated they will perform the lumbar puncture.  Antibiotics were initiated  early in the patient's course.  She's given a fluid bolus.  Her lactate is elevated at 3.9..  Abdominal exam is benign.  She also has an elevated anion gap of 25.  Her blood sugar is elevated at 400.  This could represent diabetic ketoacidosis.  She was started on insulin drip in the emergency department.  Doubt intra-abdominal sepsis.  Could represent bacteremia although no obvious source.  Lyanne Co, MD 03/30/13 1528  Lyanne Co, MD 03/30/13 [phone]

## 2013-03-30 NOTE — ED Notes (Signed)
Critical I stat Lactic - Dr Patria Mane aware [phone]

## 2013-03-30 NOTE — Procedures (Signed)
Chlorprep Consent son Lido 1% 7 cc Severe fibrosis, scar, aborted after 3 pass attempts  Mcarthur Rossetti. Tyson Alias, MD, FACP Pgr: [phone] [facility]

## 2013-03-30 NOTE — Telephone Encounter (Signed)
Pt's son, Janann August, is asking to speak w/ Shanda Bumps or TP regarding mother's admittance to [facility] earlier today.  Pt's son can be reached at [phone].  Antionette Fairy'

## 2013-03-30 NOTE — ED Notes (Signed)
Per EMS report patient lives alone at home and friend was visiting her at 1700 yesterday during which time the patient was at normal mental status of A&O x 4. Per EMS when friend visited today patient was confused and "wasn't talking to her," patient has odorous urine smell. Patient ambulates at home with a cane.

## 2013-03-30 NOTE — Progress Notes (Addendum)
Patient arrived via stretcher by ED RN to room 1224 @ 1600. No apparent distress noted. Allison Rodgers (CCM) and nurse present at bedside.

## 2013-03-30 NOTE — Telephone Encounter (Signed)
LMTCB x1 for pt.  

## 2013-03-30 NOTE — Progress Notes (Signed)
UR completed 

## 2013-03-30 NOTE — Telephone Encounter (Signed)
Called son and got update  Son to fly home

## 2013-03-30 NOTE — Progress Notes (Signed)
ANTIBIOTIC CONSULT NOTE - INITIAL  Pharmacy Consult for Aztreonam, Levofloxacin, Vancomycin Indication: Sepsis  Allergies  Allergen Reactions  . Penicillins Other (See Comments)    REACTION: causes hallucinations  . Sulfamethoxazole W-Trimethoprim Swelling    REACTION: rash  . Bactrim     Patient Measurements:   Last documented weight 83kg (03/24/13)  Vital Signs: Temp: 102 F (38.9 C) (08/19 1137) Temp src: Rectal (08/19 1137) BP: 182/66 mmHg (08/19 1137) Pulse Rate: 96 (08/19 1137)  Labs:  Recent Labs  03/30/13 1305  WBC 10.2  HGB 15.6*  PLT 144*  CREATININE 1.76*   The CrCl is unknown because both a height and weight (above a minimum accepted value) are required for this calculation.   Microbiology: Recent Results (from the past 720 hour(s))  URINE CULTURE     Status: None   Collection Time    03/24/13 12:03 PM      Result Value Range Status   Colony Count 45,000 COLONIES/ML   Final   Organism ID, Bacteria Multiple bacterial morphotypes present, none   Final   Organism ID, Bacteria predominant. Suggest appropriate recollection if    Final   Organism ID, Bacteria clinically indicated.   Final    Medical History: Past Medical History  Diagnosis Date  . GERD (gastroesophageal reflux disease)   . Chronic abdominal pain   . DJD (degenerative joint disease)   . Hypertensive cardiovascular disease   . Obesity   . Gastritis, chronic   . 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 Esophageal stricture   . Hiatal hernia   . Hx of adenomatous colonic polyps   . Diverticulosis   . Dyslipidemia   . Arthritis   . DM (diabetes mellitus)   . Hypertension   . Microcytic anemia   . Edema   . Renal disorder     Medications:  Anti-infectives   Start     Dose/Rate Route Frequency Ordered Stop   03/30/13 1230  levofloxacin (LEVAQUIN) IVPB 750 mg     750 mg 100 mL/hr over 90 Minutes Intravenous  Once 03/30/13 1222     03/30/13  1230  aztreonam (AZACTAM) 2 g in dextrose 5 % 50 mL IVPB     2 g 100 mL/hr over 30 Minutes Intravenous  Once 03/30/13 1222 03/30/13 1331   03/30/13 1230  vancomycin (VANCOCIN) IVPB 1000 mg/200 mL premix     1,000 mg 200 mL/hr over 60 Minutes Intravenous  Once 03/30/13 1222       Assessment: 79 yo F admitted 8/19 with r/o sepsis with concern for UTI.  Pharmacy is asked to dose Aztreonam, Levofloxacin, Vancomycin.  SCr pending 1.76, CrCl ~ 26 ml/min (N)  Last known weight documented as 83.1kg  WBC 10.2, Tm 102  Blood and urine cultures pending.  Lactic acid 3.9  Goal of Therapy:  Vancomycin trough level 15-20 mcg/ml  Plan:   Levofloxacin 750mg  IV q48h  Aztreonam 1g IV q8h.  Vancomycin 1g IV q24h.  Measure Vanc trough at steady state.  Follow up renal fxn and culture results.   Lynann Beaver PharmD, BCPS Pager [phone] 03/30/2013 2:30 PM

## 2013-03-30 NOTE — Telephone Encounter (Signed)
I spoke with the pt son and he just wanted to let TP and MW know that he called the pt this AM about an appt and she did not answer the phone so he sent someone over there to check on her and she was not responsive so they called 911 and the pt is currently in [facility] ER. I advised I will pass this info onto TP. Carron Curie, CMA

## 2013-03-30 NOTE — H&P (Signed)
PULMONARY  / CRITICAL CARE MEDICINE  Name: Allison Rodgers MRN: [id number] DOB: 1924-01-06    ADMISSION DATE:  03/30/2013  REFERRING MD :  EDP - Dr. Patria Mane PRIMARY SERVICE: PCCM  CHIEF COMPLAINT:  Acute Encephalopathy / DKA  BRIEF PATIENT DESCRIPTION: 79 y/o F admitted 8/19 with acute encephalopathy, fever 102 and DKA.    SIGNIFICANT EVENTS / STUDIES:  8/19 - Admit with acute encephalopathy, fever 102 and DKA.    LINES / TUBES:  CULTURES: BCx2 8/19>>> UC 8/19>>> CSF 8/19>>>failed LP, scar  ANTIBIOTICS: Rocephin 8/19>>> Acyclovir 8/19>>> Vanco 8/19>>>  HISTORY OF PRESENT ILLNESS: 79 y/o F with PMH of DJD, HTN, CAD, HLD, DM, Obesity, esophageal strictures who presented to [facility] ER on 8/19 after being found by her neighbor lethargic / confused.  She was reportedly normal yesterday per family.  She normally speaks to her son on the phone in the mornings and did not answer his call alerting him to call local family.    ER evaluation demonstrated fever of 102, anion gap acidosis, concern for DKA, elevated lactic acid, negative troponin, sr cr 1.76 (?~ baseline of 1.3-1.7), CT of head with moderate atrophy but no acute abnormality, and clear cxr.  Negative urinalysis.  Admitted for further evaluation of fever, AMS, DKA.    PAST MEDICAL HISTORY :  Past Medical History  Diagnosis Date  . GERD (gastroesophageal reflux disease)   . Chronic abdominal pain   . DJD (degenerative joint disease)   . Hypertensive cardiovascular disease   . Obesity   . Gastritis, chronic   . 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 Esophageal stricture   . Hiatal hernia   . Hx of adenomatous colonic polyps   . Diverticulosis   . Dyslipidemia   . Arthritis   . DM (diabetes mellitus)   . Hypertension   . Microcytic anemia   . Edema   . Renal disorder    Past Surgical History  Procedure Laterality Date  . Appendectomy    . Cholecystectomy  1999  .  Vesicovaginal fistula closure w/ tah  1957  . Bilateral oophorectomy  1957  . Hernia repair  08/2005    umbilical and ventral hernia repair by Dr. Jamey Ripa  . Rotator cuff repair  7/05    right  . Back surgery     Prior to Admission medications   Medication Sig Start Date End Date Taking? Authorizing Provider  acetaminophen (TYLENOL) 500 MG tablet 500-1,000 mg every 6 (six) hours as needed for pain. Per bottle as needed for pain   Yes Historical Provider, MD  ALPRAZolam (XANAX) 0.25 MG tablet Take 0.25 mg by mouth every 6 (six) hours as needed for anxiety. 12/22/12  Yes Nyoka Cowden, MD  aspirin 81 MG chewable tablet Chew 81 mg by mouth daily.     Yes Historical Provider, MD  bimatoprost (LUMIGAN) 0.03 % ophthalmic solution Place 1 drop into both eyes at bedtime.    Yes Historical Provider, MD  Calcium Carbonate-Vitamin D (CALCIUM PLUS VITAMIN D PO) Take 1 tablet by mouth daily.     Yes Historical Provider, MD  chlorpheniramine (CHLOR-TRIMETON) 4 MG tablet Take 4 mg by mouth 2 (two) times daily as needed (for nasal drip, drainage).   Yes Historical Provider, MD  dextromethorphan (TUSSIN COUGH) 15 MG/5ML syrup 1-2 tsp every 4 hours as needed for cough   Yes Historical Provider, MD  dorzolamide (TRUSOPT) 2 % ophthalmic solution Place  1 drop into both eyes at bedtime.   Yes Historical Provider, MD  esomeprazole (NEXIUM) 40 MG capsule Take 1 capsule (40 mg total) by mouth daily after breakfast. 02/11/13 02/26/15 Yes Nyoka Cowden, MD  famotidine (PEPCID) 20 MG tablet Take 20 mg by mouth at bedtime.   Yes Historical Provider, MD  fluocinonide cream (LIDEX) 0.05 % Apply 1 application topically 2 (two) times daily as needed (rash).   Yes Historical Provider, MD  furosemide (LASIX) 40 MG tablet Take 80 mg by mouth See admin instructions. Two tabs (80mg ) every morning, two tabs (80mg ) extra if swelling   Yes Historical Provider, MD  glimepiride (AMARYL) 2 MG tablet Take 2 mg by mouth daily with breakfast.    Yes Historical Provider, MD  insulin glargine (LANTUS) 100 UNIT/ML injection Inject 30 Units into the skin at bedtime.   Yes Historical Provider, MD  methylcellulose (CITRUCEL) oral powder 1 tbsp by mouth once daily   Yes Historical Provider, MD  oxyCODONE-acetaminophen (ROXICET) 5-325 MG/5ML solution Take 5 mLs by mouth every 6 (six) hours as needed.    Yes Historical Provider, MD  sitaGLIPtin (JANUVIA) 50 MG tablet Take 50 mg by mouth daily.   Yes Historical Provider, MD  sodium chloride (OCEAN) 0.65 % nasal spray Place 2 sprays into the nose as needed for congestion.   Yes Historical Provider, MD  spironolactone (ALDACTONE) 50 MG tablet Take 100 mg by mouth See admin instructions. Two tabs in morning and two tabs at noon   Yes Historical Provider, MD   Allergies  Allergen Reactions  . Penicillins Other (See Comments)    REACTION: causes hallucinations  . Sulfamethoxazole W-Trimethoprim Swelling    REACTION: rash  . Bactrim     FAMILY HISTORY:  Family History  Problem Relation Age of Onset  . Stroke Mother   . Cancer Father   . Hypertension Other    SOCIAL HISTORY:  reports that she has never smoked. She has never used smokeless tobacco. She reports that she does not drink alcohol or use illicit drugs.  REVIEW OF SYSTEMS:  Unable to complete as pt is altered.   SUBJECTIVE:   VITAL SIGNS: Temp:  [102 F (38.9 C)] 102 F (38.9 C) (08/19 1137) Pulse Rate:  [96] 96 (08/19 1137) Resp:  [18-24] 24 (08/19 1415) BP: (182)/(66) 182/66 mmHg (08/19 1137) SpO2:  [95 %-96 %] 95 % (08/19 1211)  HEMODYNAMICS:    VENTILATOR SETTINGS:    INTAKE / OUTPUT: Intake/Output   None     PHYSICAL EXAMINATION: General:  wdwn elderly female in NAD Neuro:  Altered, arouses to loud voice, physical stimulation but drifts back to sleep, no nuchal rigidity HEENT:  Mm pink/dry, no jvd Cardiovascular:  s1s2 rrr, tachy Lungs:  resp's even / non-labored, lungs bilaterally clear Abdomen:   Obese/soft, bsx4 active Musculoskeletal:  No acute deformities Skin:  Warm/dry, no edema  LABS:  CBC Recent Labs     03/30/13  1305  WBC  10.2  HGB  15.6*  HCT  48.1*  PLT  144*   Coag's Recent Labs     03/30/13  1455  INR  1.14   BMET Recent Labs     03/30/13  1305  NA  133*  K  4.1  CL  88*  CO2  22  BUN  38*  CREATININE  1.76*  GLUCOSE  430*   Electrolytes Recent Labs     03/30/13  1305  CALCIUM  10.6*   Sepsis  Markers No results found for this basename: LACTICACIDVEN, PROCALCITON, O2SATVEN,  in the last 72 hours ABG No results found for this basename: PHART, PCO2ART, PO2ART,  in the last 72 hours Liver Enzymes Recent Labs     03/30/13  1305  AST  20  ALT  13  ALKPHOS  113  BILITOT  0.7  ALBUMIN  4.3   Cardiac Enzymes Recent Labs     03/30/13  1305  TROPONINI  <0.30   Glucose No results found for this basename: GLUCAP,  in the last 72 hours  Imaging Dg Chest 2 View  03/30/2013   *RADIOLOGY REPORT*  Clinical Data: Altered mental status with weakness.  CHEST - 2 VIEW  Comparison: 08/28/2010.  Findings: Trachea is midline.  Heart size stable.  Thoracic aorta is calcified.  Minimal bibasilar atelectasis or scarring.  No pleural fluid. Left hemidiaphragm is mildly elevated, as before.  IMPRESSION: Minimal bibasilar atelectasis or scarring.   Original Report Authenticated By: Leanna Battles, M.D.   Ct Head Wo Contrast  03/30/2013   *RADIOLOGY REPORT*  Clinical Data: Altered mental status  CT HEAD WITHOUT CONTRAST  Technique:  Contiguous axial images were obtained from the base of the skull through the vertex without contrast.  Comparison: MRI 11/14/2012  Findings: Moderately severe generalized atrophy, unchanged.  No acute infarct.  Negative for hemorrhage or mass.  No midline shift. No skull lesion identified.  IMPRESSION: Moderate atrophy.  No acute abnormality.   Original Report Authenticated By: Janeece Riggers, M.D.     CXR: 8/19 minimal basilar  atx  ASSESSMENT / PLAN:  PULMONARY A: At risk ATX - with AMS Monitor for failed airway protection P:   -pulmonary hygiene as able -follow cxr -oxygen to support sats > 92% -protects airway for now  CARDIOVASCULAR A:  HTN Lactic acidosis hypovolemia P:  -hold home lasix, spironolactone -tele -volume -may need cvl  RENAL A:   Acute Kidney Injury Anion Gap Acidosis- calculated gap 23 Additional MEt alk (prior vomit?) P:   -gentle hydration -may need cvl -saline -bmet q4h -no role bicarb  GASTROINTESTINAL A:   Obesity P:   Npo as poor neurostatus ppi  HEMATOLOGIC A:  dvt prevention P:  Consider sub q heop coags wnl  INFECTIOUS A:  R/o meningitis, r/o encephalitis P:   Scar at Lumbar prevents successful LP, also agitation level Would not want to intubate for LP / IR Treat empiric ceftriaxone, vanc, decadron for now CT reviewed  ENDOCRINE A:   DKA Diabetes Mellitus P:   Insulin drip dka protocol  NEUROLOGIC A:   Acute Encephalopathy - normal 8/18, found lethargic 8/19 am R/o meningitis, enceph P:   See ID Limit narcs, benzo May need MRI brain if not improved   I have personally obtained a history, examined the patient, evaluated laboratory and imaging results, formulated the assessment and plan and placed orders. CRITICAL CARE: The patient is critically ill with multiple organ systems failure and requires high complexity decision making for assessment and support, frequent evaluation and titration of therapies, application of advanced monitoring technologies and extensive interpretation of multiple databases. Critical Care Time devoted to patient care services described in this note is 30 minutes.   Family updated, I also called [place] in [place]. Tyson Alias, MD, FACP Pgr: [phone] [facility]  Pulmonary and Critical Care Medicine [facility] Pager: [phone]  03/30/2013, 3:51 PM

## 2013-03-30 NOTE — ED Notes (Signed)
Patient transported to X-ray 

## 2013-03-30 NOTE — ED Notes (Signed)
Bed: WA07 Expected date:  Expected time:  Means of arrival:  Comments: ?uti

## 2013-03-30 NOTE — Procedures (Signed)
Name:  BRENISHA TSUI MRN:  [id number] DOB:  May 14, 1924  PROCEDURE NOTE  Procedure:  Central venous catheter placement.  Indications:  Need for intravenous access and hemodynamic monitoring.  Consent:  Consent was implied due to the emergency nature of the procedure.  Anesthesia:  A total of 10 mL of 1% Lidocaine was used for local infiltration anesthesia.  Procedure summary:  Appropriate equipment was assembled.  The patient was identified as Company secretary and safety timeout was performed. The patient was placed in Trendelenburg position.  Sterile technique was used. The patient's left anterior chest wall was prepped using chlorhexidine / alcohol scrub and the field was draped in usual sterile fashion with full body drape. After the adequate anesthesia was achieved, the left subclavian vein was cannulated with the introducer needle without difficulty. A guide wire was advanced through the introducer needle, which was then withdrawn. A small skin incision was made at the point of wire entry, the dilator was inserted over the guide wire and appropriate dilation was obtained. The dilator was removed and triple-lumen catheter was advanced over the guide wire, which was then removed.  All ports were aspirated and flushed with normal saline without difficulty. The catheter was secured into place. Antibiotic patch was placed and sterile dressing was applied. Post-procedure chest x-ray was ordered.  Complications:  No immediate complications were noted.  Hemodynamic parameters and oxygenation remained stable throughout the procedure.  Estimated blood loss:  Less then 5 mL.  Lonia Farber, MD Pulmonary and Critical Care Medicine [facility] Cell: [phone]  03/30/2013, 9:13 PM

## 2013-03-30 NOTE — ED Notes (Signed)
MD at bedside. 

## 2013-03-31 ENCOUNTER — Ambulatory Visit: Payer: Medicare PPO | Admitting: Internal Medicine

## 2013-03-31 ENCOUNTER — Inpatient Hospital Stay (HOSPITAL_COMMUNITY): Payer: Medicare PPO

## 2013-03-31 LAB — BASIC METABOLIC PANEL
Calcium: 9.6 mg/dL (ref 8.4–10.5)
Chloride: 98 mEq/L (ref 96–112)
GFR calc Af Amer: 35 mL/min — ABNORMAL LOW (ref >90)
GFR calc Af Amer: 36 mL/min — ABNORMAL LOW (ref >90)
GFR calc non Af Amer: 30 mL/min — ABNORMAL LOW (ref >90)
GFR calc non Af Amer: 31 mL/min — ABNORMAL LOW (ref >90)
Potassium: 3.9 mEq/L (ref 3.5–5.1)
Potassium: 3.8 mEq/L (ref 3.5–5.1)
Sodium: 134 mEq/L — ABNORMAL LOW (ref 135–145)
Sodium: 135 mEq/L (ref 135–145)

## 2013-03-31 LAB — GLUCOSE, CAPILLARY
Glucose-Capillary: 432 mg/dL — ABNORMAL HIGH (ref 70–99)
Glucose-Capillary: 410 mg/dL — ABNORMAL HIGH (ref 70–99)
Glucose-Capillary: 365 mg/dL — ABNORMAL HIGH (ref 70–99)
Glucose-Capillary: 166 mg/dL — ABNORMAL HIGH (ref 70–99)
Glucose-Capillary: 150 mg/dL — ABNORMAL HIGH (ref 70–99)
Glucose-Capillary: 150 mg/dL — ABNORMAL HIGH (ref 70–99)
Glucose-Capillary: 164 mg/dL — ABNORMAL HIGH (ref 70–99)
Glucose-Capillary: 164 mg/dL — ABNORMAL HIGH (ref 70–99)
Glucose-Capillary: 460 mg/dL — ABNORMAL HIGH (ref 70–99)
Glucose-Capillary: 391 mg/dL — ABNORMAL HIGH (ref 70–99)

## 2013-03-31 LAB — CBC
Hemoglobin: 14.5 g/dL (ref 12.0–15.0)
MCHC: 32.7 g/dL (ref 30.0–36.0)
Platelets: 166 10*3/uL (ref 150–400)
RDW: 12.8 % (ref 11.5–15.5)

## 2013-03-31 LAB — MAGNESIUM: Magnesium: 1.5 mg/dL (ref 1.5–2.5)

## 2013-03-31 LAB — LACTIC ACID, PLASMA: Lactic Acid, Venous: 1.4 mmol/L (ref 0.5–2.2)

## 2013-03-31 LAB — URINE CULTURE

## 2013-03-31 MED ORDER — ACETAMINOPHEN 650 MG RE SUPP: 650.0000 mg | Freq: Four times a day (QID) | RECTAL | Status: DC | PRN

## 2013-03-31 MED ORDER — INSULIN REGULAR HUMAN 100 UNIT/ML IJ SOLN
INTRAMUSCULAR | Status: DC
  Administered 2013-03-31: 3.3 [IU]/h via INTRAVENOUS
  Filled 2013-03-31 (×2): qty 1

## 2013-03-31 MED ORDER — INSULIN ASPART 100 UNIT/ML ~~LOC~~ SOLN
2.0000 [IU] | Freq: Three times a day (TID) | SUBCUTANEOUS | Status: DC
  Administered 2013-03-31: 6 [IU] via SUBCUTANEOUS

## 2013-03-31 MED ORDER — INSULIN GLARGINE 100 UNIT/ML ~~LOC~~ SOLN
20.0000 [IU] | Freq: Once | SUBCUTANEOUS | Status: AC
  Administered 2013-03-31: 20 [IU] via SUBCUTANEOUS
  Filled 2013-03-31 (×2): qty 0.2

## 2013-03-31 MED ORDER — LORAZEPAM 2 MG/ML IJ SOLN
1.0000 mg | Freq: Once | INTRAMUSCULAR | Status: AC
  Administered 2013-04-01: 1 mg via INTRAVENOUS
  Filled 2013-03-31 (×3): qty 1

## 2013-03-31 NOTE — H&P (Signed)
PULMONARY  / CRITICAL CARE MEDICINE  Name: Allison Rodgers MRN: [id number] DOB: 11/28/1923    ADMISSION DATE:  03/30/2013  REFERRING MD :  EDP - Dr. Patria Mane PRIMARY SERVICE: PCCM  CHIEF COMPLAINT:  Acute Encephalopathy / DKA  BRIEF PATIENT DESCRIPTION: 79 y/o F admitted 8/19 with acute encephalopathy, fever 102 and DKA.    SIGNIFICANT EVENTS / STUDIES:  8/19 - Admit with acute encephalopathy, fever 102 and DKA.    LINES / TUBES:  CULTURES: BCx2 8/19>>> UC 8/19>>> CSF 8/19>>>failed LP, scar  ANTIBIOTICS: Rocephin 8/19>>> Acyclovir 8/19>>> Vanco 8/19>>>   SUBJECTIVE: RN reports improvement in mental status - awake, answers questions but not appropriate  VITAL SIGNS: Temp:  [98.4 F (36.9 C)-102 F (38.9 C)] 98.6 F (37 C) (08/20 1000) Pulse Rate:  [61-118] 71 (08/20 1000) Resp:  [14-24] 14 (08/20 1000) BP: (124-199)/(40-135) 166/64 mmHg (08/20 1000) SpO2:  [94 %-100 %] 97 % (08/20 1000) Weight:  [176 lb 2.4 oz (79.9 kg)-183 lb 3.2 oz (83.1 kg)] 183 lb 3.2 oz (83.1 kg) (08/19 1900)  INTAKE / OUTPUT: Intake/Output     08/19 0701 - 08/20 0700 08/20 0701 - 08/21 0700   I.V. (mL/kg) 2054.6 (24.7) 155.7 (1.9)   IV Piggyback 350    Total Intake(mL/kg) 2404.6 (28.9) 155.7 (1.9)   Urine (mL/kg/hr) 900 75 (0.2)   Total Output 900 75   Net +1504.6 +80.7          PHYSICAL EXAMINATION: General:  wdwn elderly female in NAD Neuro:  Arouses, answers questions but pleasantly confused, becomes frustrated with questioning HEENT:  Mm pink/dry, no jvd Cardiovascular:  s1s2 rrr, tachy Lungs:  resp's even / non-labored, lungs bilaterally clear Abdomen:  Obese/soft, bsx4 active Musculoskeletal:  No acute deformities Skin:  Warm/dry, no edema  LABS:  CBC Recent Labs     03/30/13  1305  03/30/13  1728  03/31/13  0430  WBC  10.2  12.1*  17.9*  HGB  15.6*  15.8*  14.5  HCT  48.1*  48.1*  44.3  PLT  144*  139*  166   Coag's Recent Labs     03/30/13  1455  INR  1.14    BMET Recent Labs     03/30/13  2130  03/31/13  0020  03/31/13  0430  NA  135  134*  135  K  3.9  3.9  3.8  CL  99  98  99  CO2  25  24  25   BUN  32*  32*  32*  CREATININE  1.50*  1.49*  1.46*  GLUCOSE  167*  149*  139*   Electrolytes Recent Labs     03/30/13  2130  03/31/13  0020  03/31/13  0430  CALCIUM  9.6  9.4  9.6  MG   --    --   1.5   Liver Enzymes Recent Labs     03/30/13  1305  AST  20  ALT  13  ALKPHOS  113  BILITOT  0.7  ALBUMIN  4.3   Cardiac Enzymes Recent Labs     03/30/13  1305  TROPONINI  <0.30   Glucose Recent Labs     03/30/13  2127  03/30/13  2231  03/30/13  2351  03/31/13  0054  03/31/13  0158  03/31/13  0313  GLUCAP  188*  171*  152*  165*  166*  150*    Imaging Dg Chest 2 View  03/30/2013   *  RADIOLOGY REPORT*  Clinical Data: Altered mental status with weakness.  CHEST - 2 VIEW  Comparison: 08/28/2010.  Findings: Trachea is midline.  Heart size stable.  Thoracic aorta is calcified.  Minimal bibasilar atelectasis or scarring.  No pleural fluid. Left hemidiaphragm is mildly elevated, as before.  IMPRESSION: Minimal bibasilar atelectasis or scarring.   Original Report Authenticated By: Leanna Battles, M.D.   Ct Head Wo Contrast  03/30/2013   *RADIOLOGY REPORT*  Clinical Data: Altered mental status  CT HEAD WITHOUT CONTRAST  Technique:  Contiguous axial images were obtained from the base of the skull through the vertex without contrast.  Comparison: MRI 11/14/2012  Findings: Moderately severe generalized atrophy, unchanged.  No acute infarct.  Negative for hemorrhage or mass.  No midline shift. No skull lesion identified.  IMPRESSION: Moderate atrophy.  No acute abnormality.   Original Report Authenticated By: Janeece Riggers, M.D.   Dg Chest Port 1 View  03/31/2013   *RADIOLOGY REPORT*  Clinical Data: Evaluate airspace disease, atelectasis.  PORTABLE CHEST - 1 VIEW  Comparison: 03/30/2013  Findings: Left central line remains in place,  unchanged.  Left base atelectasis with slight elevation of the left hemidiaphragm, stable.  Right lung is clear.  No visible effusions.  No acute bony abnormality.  IMPRESSION: Minimal left base atelectasis.   Original Report Authenticated By: Charlett Nose, M.D.   Dg Chest Port 1 View  03/30/2013   *RADIOLOGY REPORT*  Clinical Data: Status post central line placement  PORTABLE CHEST - 1 VIEW  Comparison: 03/30/2013 1236 hours  Findings: There is been interval placement of a left-sided subclavian central line.  Catheter tip is noted in the upper right atrium.  No pneumothorax is noted.  The overall inspiratory effort is poor with crowding of the vascular markings.  Mild left basilar atelectasis is seen.  Cardiac shadow is stable.  IMPRESSION: Status post left central line placement without evidence of pneumothorax.   Original Report Authenticated By: Alcide Clever, M.D.     CXR: 8/19 minimal basilar atx  ASSESSMENT / PLAN:  PULMONARY A: At risk ATX - with AMS Monitor for failed airway protection P:   -pulmonary hygiene as able, upright positioning -follow cxr -oxygen to support sats > 92%   CARDIOVASCULAR A:  HTN Lactic acidosis hypovolemia P:  -hold home lasix, spironolactone -tele / ICU monitoring -repeat lactic acid am 8/20 to ensure clearance  RENAL A:   Acute Kidney Injury Anion Gap Acidosis- calculated gap 23 Additional MEt alk (prior vomit?) P:   -gentle hydration -reduce bmp frequency with gap closure  GASTROINTESTINAL A:   Obesity P:   NPO as poor neurostatus PPI  HEMATOLOGIC A:  dvt prevention P:  SCD's  INFECTIOUS A:  R/o meningitis, r/o encephalitis - unable to perform LP due to scar tissue from previous surgeries Leukocytosis / Fever P:   Treat empiric ceftriaxone, vanc, decadron for now Wean decadron quickly as a possible contributor to encephalopathy CT reviewed  ENDOCRINE A:   DKA Diabetes Mellitus P:   Insulin drip DKA  protocol  NEUROLOGIC A:   Acute Encephalopathy - normal 8/18, found lethargic 8/19 am.  Improved 8/20 but not to baseline.  R/o meningitis, enceph P:   See ID Limit narcs, benzo's MRI / MRA Brain 8/20   I have personally obtained a history, examined the patient, evaluated laboratory and imaging results, formulated the assessment and plan and placed orders.  CRITICAL CARE: The patient is critically ill with multiple organ systems failure and requires  high complexity decision making for assessment and support, frequent evaluation and titration of therapies, application of advanced monitoring technologies and extensive interpretation of multiple databases. Critical Care Time devoted to patient care services described in this note is 45 minutes.    Levy Pupa, MD, PhD 03/31/2013, 5:06 PM [facility] [phone] or if no answer [phone]

## 2013-04-01 ENCOUNTER — Inpatient Hospital Stay (HOSPITAL_COMMUNITY): Payer: Medicare PPO

## 2013-04-01 ENCOUNTER — Other Ambulatory Visit (HOSPITAL_COMMUNITY): Payer: Medicare PPO

## 2013-04-01 DIAGNOSIS — G3184 Mild cognitive impairment, so stated: Secondary | ICD-10-CM

## 2013-04-01 DIAGNOSIS — I10 Essential (primary) hypertension: Secondary | ICD-10-CM

## 2013-04-01 DIAGNOSIS — E119 Type 2 diabetes mellitus without complications: Secondary | ICD-10-CM

## 2013-04-01 DIAGNOSIS — R413 Other amnesia: Secondary | ICD-10-CM

## 2013-04-01 LAB — GLUCOSE, CAPILLARY
Glucose-Capillary: 383 mg/dL — ABNORMAL HIGH (ref 70–99)
Glucose-Capillary: 233 mg/dL — ABNORMAL HIGH (ref 70–99)
Glucose-Capillary: 173 mg/dL — ABNORMAL HIGH (ref 70–99)
Glucose-Capillary: 138 mg/dL — ABNORMAL HIGH (ref 70–99)
Glucose-Capillary: 193 mg/dL — ABNORMAL HIGH (ref 70–99)
Glucose-Capillary: 367 mg/dL — ABNORMAL HIGH (ref 70–99)
Glucose-Capillary: 136 mg/dL — ABNORMAL HIGH (ref 70–99)

## 2013-04-01 LAB — BASIC METABOLIC PANEL
BUN: 33 mg/dL — ABNORMAL HIGH (ref 6–23)
Chloride: 103 mEq/L (ref 96–112)
GFR calc non Af Amer: 36 mL/min — ABNORMAL LOW (ref >90)
Glucose, Bld: 129 mg/dL — ABNORMAL HIGH (ref 70–99)
Potassium: 3.6 mEq/L (ref 3.5–5.1)

## 2013-04-01 LAB — CBC
HCT: 41.9 % (ref 36.0–46.0)
Hemoglobin: 13.8 g/dL (ref 12.0–15.0)
MCH: 23.8 pg — ABNORMAL LOW (ref 26.0–34.0)
MCHC: 32.9 g/dL (ref 30.0–36.0)

## 2013-04-01 MED ORDER — INSULIN GLARGINE 100 UNIT/ML ~~LOC~~ SOLN
30.0000 [IU] | Freq: Every day | SUBCUTANEOUS | Status: DC
  Administered 2013-04-01 – 2013-04-04 (×4): 30 [IU] via SUBCUTANEOUS
  Filled 2013-04-01 (×8): qty 0.3

## 2013-04-01 MED ORDER — PSYLLIUM 95 % PO PACK
1.0000 | PACK | Freq: Every day | ORAL | Status: DC
  Administered 2013-04-01 – 2013-04-03 (×3): 1 via ORAL
  Filled 2013-04-01 (×5): qty 1

## 2013-04-01 MED ORDER — SALINE SPRAY 0.65 % NA SOLN: 2.0000 | NASAL | Status: DC | PRN

## 2013-04-01 MED ORDER — METHYLCELLULOSE (LAXATIVE) PO POWD: 1.0000 | Freq: Every evening | ORAL | Status: DC

## 2013-04-01 MED ORDER — ASPIRIN 81 MG PO CHEW
81.0000 mg | CHEWABLE_TABLET | Freq: Every day | ORAL | Status: DC
  Administered 2013-04-01 – 2013-04-05 (×5): 81 mg via ORAL
  Filled 2013-04-01 (×5): qty 1

## 2013-04-01 MED ORDER — IOHEXOL 300 MG/ML  SOLN: 25.0000 mL | INTRAMUSCULAR | Status: AC

## 2013-04-01 MED ORDER — DORZOLAMIDE HCL 2 % OP SOLN
1.0000 [drp] | Freq: Every day | OPHTHALMIC | Status: DC
  Administered 2013-04-01 – 2013-04-04 (×4): 1 [drp] via OPHTHALMIC
  Filled 2013-04-01 (×2): qty 10

## 2013-04-01 MED ORDER — INSULIN ASPART 100 UNIT/ML ~~LOC~~ SOLN
0.0000 [IU] | Freq: Every day | SUBCUTANEOUS | Status: DC
  Administered 2013-04-04: 2 [IU] via SUBCUTANEOUS

## 2013-04-01 MED ORDER — SODIUM CHLORIDE 0.65 % NA SOLN: 2.0000 | NASAL | Status: DC | PRN

## 2013-04-01 MED ORDER — SPIRONOLACTONE 100 MG PO TABS
100.0000 mg | ORAL_TABLET | Freq: Two times a day (BID) | ORAL | Status: DC
  Administered 2013-04-01 – 2013-04-05 (×9): 100 mg via ORAL
  Filled 2013-04-01 (×10): qty 1

## 2013-04-01 MED ORDER — LATANOPROST 0.005 % OP SOLN
1.0000 [drp] | Freq: Every day | OPHTHALMIC | Status: DC
  Administered 2013-04-01 – 2013-04-04 (×4): 1 [drp] via OPHTHALMIC
  Filled 2013-04-01 (×2): qty 2.5

## 2013-04-01 MED ORDER — DEXTROSE-NACL 5-0.9 % IV SOLN
INTRAVENOUS | Status: DC
  Administered 2013-04-01: 04:00:00 via INTRAVENOUS

## 2013-04-01 MED ORDER — INSULIN ASPART 100 UNIT/ML ~~LOC~~ SOLN
0.0000 [IU] | Freq: Three times a day (TID) | SUBCUTANEOUS | Status: DC
  Administered 2013-04-01: 7 [IU] via SUBCUTANEOUS
  Administered 2013-04-01: 20 [IU] via SUBCUTANEOUS
  Administered 2013-04-02: 3 [IU] via SUBCUTANEOUS
  Administered 2013-04-02: 4 [IU] via SUBCUTANEOUS
  Administered 2013-04-02 – 2013-04-03 (×2): 11 [IU] via SUBCUTANEOUS
  Administered 2013-04-03: 4 [IU] via SUBCUTANEOUS
  Administered 2013-04-04: 7 [IU] via SUBCUTANEOUS
  Administered 2013-04-04: 4 [IU] via SUBCUTANEOUS
  Administered 2013-04-04: 11 [IU] via SUBCUTANEOUS
  Administered 2013-04-05: 7 [IU] via SUBCUTANEOUS

## 2013-04-01 MED ORDER — FUROSEMIDE 80 MG PO TABS
80.0000 mg | ORAL_TABLET | Freq: Every day | ORAL | Status: DC
  Administered 2013-04-01 – 2013-04-05 (×5): 80 mg via ORAL
  Filled 2013-04-01 (×5): qty 1

## 2013-04-01 NOTE — Progress Notes (Signed)
PULMONARY  / CRITICAL CARE MEDICINE  Name: Allison Rodgers MRN: [id number] DOB: 09/29/1923    ADMISSION DATE:  03/30/2013  REFERRING MD :  [facility] - Dr. Patria Mane PRIMARY SERVICE: PCCM  CHIEF COMPLAINT:  Acute Encephalopathy / DKA  BRIEF PATIENT DESCRIPTION: 79 y/o F admitted 8/19 with acute encephalopathy, fever 102 and DKA.    SIGNIFICANT EVENTS / STUDIES:  8/19 - Admit with acute encephalopathy, fever 102 and DKA.    LINES / TUBES:  CULTURES: BCx2 8/19>>> UC 8/19>>> CSF 8/19>>>failed LP, scar  ANTIBIOTICS: Rocephin 8/19>>> Acyclovir 8/19>>> 8/19 Vanco 8/19>>>   SUBJECTIVE:  Started back on insulin gtt last night, ? Also on D5NS A bit better MS. Son at bedside and reports that she is approaching her recent baseline (which still includes some poor memory, insight and some dementia). He confirms that she has been unable to manage meds at home alone.    VITAL SIGNS: Temp:  [96.8 F (36 C)-98.6 F (37 C)] 97.7 F (36.5 C) (08/21 0600) Pulse Rate:  [46-81] 51 (08/21 0600) Resp:  [14-22] 15 (08/21 0600) BP: (119-189)/(38-133) 124/96 mmHg (08/21 0600) SpO2:  [97 %-99 %] 97 % (08/21 0600) Weight:  [82.6 kg (182 lb 1.6 oz)] 82.6 kg (182 lb 1.6 oz) (08/21 0500)  INTAKE / OUTPUT: Intake/Output     08/20 0701 - 08/21 0700 08/21 0701 - 08/22 0700   I.V. (mL/kg) 1865.7 (22.6)    IV Piggyback 300    Total Intake(mL/kg) 2165.7 (26.2)    Urine (mL/kg/hr) 1000 (0.5)    Total Output 1000     Net +1165.7          Urine Occurrence 1 x      PHYSICAL EXAMINATION: General:  wdwn elderly female in NAD Neuro:  Awake, more interactive, still some word-finding difficulty, some memory deficits, move all ext HEENT:  Mm pink/dry, no jvd Cardiovascular:  s1s2 rrr, tachy Lungs:  resp's even / non-labored, lungs bilaterally clear Abdomen:  Obese/soft, bsx4 active Musculoskeletal:  No acute deformities Skin:  Warm/dry, no edema  LABS:  CBC Recent Labs     03/30/13  1728  03/31/13  0430  04/01/13  0445  WBC  12.1*  17.9*  23.1*  HGB  15.8*  14.5  13.8  HCT  48.1*  44.3  41.9  PLT  139*  166  163   Coag's Recent Labs     03/30/13  1455  INR  1.14   BMET Recent Labs     03/31/13  0020  03/31/13  0430  04/01/13  0445  NA  134*  135  137  K  3.9  3.8  3.6  CL  98  99  103  CO2  24  25  25   BUN  32*  32*  33*  CREATININE  1.49*  1.46*  1.29*  GLUCOSE  149*  139*  129*   Electrolytes Recent Labs     03/31/13  0020  03/31/13  0430  04/01/13  0445  CALCIUM  9.4  9.6  9.0  MG   --   1.5   --    Liver Enzymes Recent Labs     03/30/13  1305  AST  20  ALT  13  ALKPHOS  113  BILITOT  0.7  ALBUMIN  4.3   Cardiac Enzymes Recent Labs     03/30/13  1305  TROPONINI  <0.30   Glucose Recent Labs     04/01/13  0138  04/01/13  0241  04/01/13  0333  04/01/13  0431  04/01/13  0535  04/01/13  0636  GLUCAP  233*  173*  135*  126*  138*  193*    Imaging Dg Chest 2 View  03/30/2013   *RADIOLOGY REPORT*  Clinical Data: Altered mental status with weakness.  CHEST - 2 VIEW  Comparison: 08/28/2010.  Findings: Trachea is midline.  Heart size stable.  Thoracic aorta is calcified.  Minimal bibasilar atelectasis or scarring.  No pleural fluid. Left hemidiaphragm is mildly elevated, as before.  IMPRESSION: Minimal bibasilar atelectasis or scarring.   Original Report Authenticated By: Leanna Battles, M.D.   Ct Head Wo Contrast  03/30/2013   *RADIOLOGY REPORT*  Clinical Data: Altered mental status  CT HEAD WITHOUT CONTRAST  Technique:  Contiguous axial images were obtained from the base of the skull through the vertex without contrast.  Comparison: MRI 11/14/2012  Findings: Moderately severe generalized atrophy, unchanged.  No acute infarct.  Negative for hemorrhage or mass.  No midline shift. No skull lesion identified.  IMPRESSION: Moderate atrophy.  No acute abnormality.   Original Report Authenticated By: Janeece Riggers, M.D.   Dg Chest Port 1 View  04/01/2013    *RADIOLOGY REPORT*  Clinical Data: Atelectasis  PORTABLE CHEST - 1 VIEW  Comparison: 03/31/2013  Findings: Mild left lower lobe atelectasis is unchanged.  Right lung remains clear.  Negative for heart failure or effusion.  Left subclavian central venous catheter tip in the SVC at the cavoatrial junction, unchanged.  No pneumothorax.  IMPRESSION: Mild left lower lobe atelectasis, unchanged.  No significant change from yesterday.   Original Report Authenticated By: Janeece Riggers, M.D.   Dg Chest Port 1 View  03/31/2013   *RADIOLOGY REPORT*  Clinical Data: Evaluate airspace disease, atelectasis.  PORTABLE CHEST - 1 VIEW  Comparison: 03/30/2013  Findings: Left central line remains in place, unchanged.  Left base atelectasis with slight elevation of the left hemidiaphragm, stable.  Right lung is clear.  No visible effusions.  No acute bony abnormality.  IMPRESSION: Minimal left base atelectasis.   Original Report Authenticated By: Charlett Nose, M.D.   Dg Chest Port 1 View  03/30/2013   *RADIOLOGY REPORT*  Clinical Data: Status post central line placement  PORTABLE CHEST - 1 VIEW  Comparison: 03/30/2013 1236 hours  Findings: There is been interval placement of a left-sided subclavian central line.  Catheter tip is noted in the upper right atrium.  No pneumothorax is noted.  The overall inspiratory effort is poor with crowding of the vascular markings.  Mild left basilar atelectasis is seen.  Cardiac shadow is stable.  IMPRESSION: Status post left central line placement without evidence of pneumothorax.   Original Report Authenticated By: Alcide Clever, M.D.     CXR: 8/19 minimal basilar atx  ASSESSMENT / PLAN:  PULMONARY A: At risk ATX - with AMS P:   -pulmonary hygiene, upright positioning  CARDIOVASCULAR A:  HTN Lactic acidosis, resolved hypovolemia P:  -add back home lasix, spironolactone 8/21 -tele / ICU monitoring  RENAL A:   Acute Kidney Injury, improving Anion Gap Acidosis- calculated  gap 23, resolved P:   -gentle hydration  GASTROINTESTINAL A:   Obesity P:   D/c PPI 8/21 as taking PO  HEMATOLOGIC A:  dvt prevention P:  SCD's  INFECTIOUS A:  R/o meningitis, r/o encephalitis - unable to perform LP due to scar tissue from previous surgeries Leukocytosis / Fever P:   Treat empiric ceftriaxone, vanc; likely have  committed to complete therapy for possible meningitis D/c decadron  ENDOCRINE A:   DKA, resolved Diabetes Mellitus P:   Transition off insulin gtt, stop D5NS Taking PO diet, advance to carb modified  NEUROLOGIC A:   Acute Encephalopathy - approaching baseline per son 8/21 R/o meningitis, enceph P:   See ID section Limit narcs, benzo's MRI / MRA Brain 8/21  DISPO Will have PT eval, suspect that she will need more help, probably SNF care at leat initially. With difficulty taking meds may need long term. Her son Janann August will help with decision making.    I have personally obtained a history, examined the patient, evaluated laboratory and imaging results, formulated the assessment and plan and placed orders.    Levy Pupa, MD, PhD 04/01/2013, 8:35 AM [facility] [phone] or if no answer [phone]

## 2013-04-01 NOTE — Progress Notes (Signed)
Called by bedside RN, she reports questionable stool from vagina / concern for failure of previous rectovaginal repair.    Plan: -assess CT abd / Pelvis for review, ? If this was source of fever -may need surgical consult pending CT results   Canary Brim, NP-C [facility] Pgr: [phone] or [phone]  Levy Pupa, MD, PhD 04/01/2013, 1:21 PM [facility] [phone] or if no answer [phone]

## 2013-04-02 ENCOUNTER — Telehealth: Payer: Self-pay | Admitting: Internal Medicine

## 2013-04-02 DIAGNOSIS — G039 Meningitis, unspecified: Secondary | ICD-10-CM | POA: Diagnosis present

## 2013-04-02 DIAGNOSIS — N184 Chronic kidney disease, stage 4 (severe): Secondary | ICD-10-CM | POA: Diagnosis present

## 2013-04-02 DIAGNOSIS — G9340 Encephalopathy, unspecified: Secondary | ICD-10-CM

## 2013-04-02 DIAGNOSIS — E1110 Type 2 diabetes mellitus with ketoacidosis without coma: Secondary | ICD-10-CM | POA: Diagnosis present

## 2013-04-02 DIAGNOSIS — F0390 Unspecified dementia without behavioral disturbance: Secondary | ICD-10-CM | POA: Diagnosis present

## 2013-04-02 DIAGNOSIS — N179 Acute kidney failure, unspecified: Secondary | ICD-10-CM | POA: Diagnosis present

## 2013-04-02 LAB — GLUCOSE, CAPILLARY
Glucose-Capillary: 139 mg/dL — ABNORMAL HIGH (ref 70–99)
Glucose-Capillary: 184 mg/dL — ABNORMAL HIGH (ref 70–99)
Glucose-Capillary: 175 mg/dL — ABNORMAL HIGH (ref 70–99)

## 2013-04-02 LAB — BASIC METABOLIC PANEL
CO2: 25 mEq/L (ref 19–32)
Calcium: 9 mg/dL (ref 8.4–10.5)
Glucose, Bld: 148 mg/dL — ABNORMAL HIGH (ref 70–99)
Sodium: 136 mEq/L (ref 135–145)

## 2013-04-02 LAB — CBC
HCT: 42.6 % (ref 36.0–46.0)
Hemoglobin: 14 g/dL (ref 12.0–15.0)
MCHC: 32.9 g/dL (ref 30.0–36.0)

## 2013-04-02 LAB — PHOSPHORUS: Phosphorus: 3.2 mg/dL (ref 2.3–4.6)

## 2013-04-02 MED ORDER — PANTOPRAZOLE SODIUM 40 MG IV SOLR
40.0000 mg | INTRAVENOUS | Status: DC
  Administered 2013-04-02 – 2013-04-03 (×2): 40 mg via INTRAVENOUS
  Filled 2013-04-02 (×2): qty 40

## 2013-04-02 NOTE — Progress Notes (Signed)
ANTIBIOTIC CONSULT NOTE   Pharmacy Consult for Vancomycin Indication: Sepsis  Allergies  Allergen Reactions  . Penicillins Other (See Comments)    REACTION: causes hallucinations  . Sulfamethoxazole W-Trimethoprim Swelling    REACTION: rash  . Bactrim     Patient Measurements: Height: 5' (152.4 cm) Weight: 178 lb 12.7 oz (81.1 kg) (stand up scale) IBW/kg (Calculated) : 45.5 Last documented weight 83kg (03/24/13)  Vital Signs: Temp: 98.1 F (36.7 C) (08/22 0459) Temp src: Oral (08/22 0459) BP: 147/62 mmHg (08/22 0459) Pulse Rate: 72 (08/22 0459)  Labs:  Recent Labs  03/31/13 0430 04/01/13 0445 04/02/13 0545  WBC 17.9* 23.1* 20.4*  HGB 14.5 13.8 14.0  PLT 166 163 151  CREATININE 1.46* 1.29* 1.35*   Estimated Creatinine Clearance: 27.7 ml/min (by C-G formula based on Cr of 1.35).   Microbiology: Recent Results (from the past 720 hour(s))  URINE CULTURE     Status: None   Collection Time    03/24/13 12:03 PM      Result Value Range Status   Colony Count 45,000 COLONIES/ML   Final   Organism ID, Bacteria Multiple bacterial morphotypes present, none   Final   Organism ID, Bacteria predominant. Suggest appropriate recollection if    Final   Organism ID, Bacteria clinically indicated.   Final  URINE CULTURE     Status: None   Collection Time    03/30/13 12:16 PM      Result Value Range Status   Specimen Description URINE, CATHETERIZED   Final   Special Requests NONE   Final   Culture  Setup Time     Final   Value: 03/30/2013 16:05     Performed at [facility] Count     Final   Value: NO GROWTH     Performed at [facility]   Culture     Final   Value: NO GROWTH     Performed at [facility]   Report Status 03/31/2013 FINAL   Final  CULTURE, BLOOD (ROUTINE X 2)     Status: None   Collection Time    03/30/13 12:50 PM      Result Value Range Status   Specimen Description BLOOD RIGHT HAND   Final   Special Requests BOTTLES  DRAWN AEROBIC AND ANAEROBIC   Final   Culture  Setup Time     Final   Value: 03/30/2013 17:22     Performed at [facility]   Culture     Final   Value:        BLOOD CULTURE RECEIVED NO GROWTH TO DATE CULTURE WILL BE HELD FOR 5 DAYS BEFORE ISSUING A FINAL NEGATIVE REPORT     Performed at [facility]   Report Status PENDING   Incomplete  CULTURE, BLOOD (ROUTINE X 2)     Status: None   Collection Time    03/30/13  1:05 PM      Result Value Range Status   Specimen Description BLOOD RIGHT FOREARM   Final   Special Requests BOTTLES DRAWN AEROBIC ONLY   Final   Culture  Setup Time     Final   Value: 03/30/2013 17:22     Performed at [facility]   Culture     Final   Value:        BLOOD CULTURE RECEIVED NO GROWTH TO DATE CULTURE WILL BE HELD FOR 5 DAYS BEFORE ISSUING A FINAL  NEGATIVE REPORT     Performed at [facility]   Report Status PENDING   Incomplete  MRSA PCR SCREENING     Status: None   Collection Time    03/30/13  4:52 PM      Result Value Range Status   MRSA by PCR NEGATIVE  NEGATIVE Final   Comment:            The GeneXpert MRSA Assay (FDA     approved for NASAL specimens     only), is one component of a     comprehensive MRSA colonization     surveillance program. It is not     intended to diagnose MRSA     infection nor to guide or     monitor treatment for     MRSA infections.     Performed at [facility]    Medical History: Past Medical History  Diagnosis Date  . GERD (gastroesophageal reflux disease)   . Chronic abdominal pain   . DJD (degenerative joint disease)   . Hypertensive cardiovascular disease   . Obesity   . Gastritis, chronic   . 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 Esophageal stricture   . Hiatal hernia   . Hx of adenomatous colonic polyps   . Diverticulosis   . Dyslipidemia   . Arthritis   . DM (diabetes mellitus)   . Hypertension   . Microcytic anemia    . Edema   . Renal disorder     Medications:  Anti-infectives   Start     Dose/Rate Route Frequency Ordered Stop   04/01/13 1400  levofloxacin (LEVAQUIN) IVPB 750 mg  Status:  Discontinued     750 mg 100 mL/hr over 90 Minutes Intravenous Every 48 hours 03/30/13 1441 03/30/13 1622   03/31/13 1600  cefTRIAXone (ROCEPHIN) 2 g in dextrose 5 % 50 mL IVPB  Status:  Discontinued     2 g 100 mL/hr over 30 Minutes Intravenous Every 24 hours 03/30/13 1622 03/30/13 1927   03/31/13 1400  vancomycin (VANCOCIN) IVPB 1000 mg/200 mL premix     1,000 mg 200 mL/hr over 60 Minutes Intravenous Every 24 hours 03/30/13 1441     03/31/13 0800  cefTRIAXone (ROCEPHIN) 1 g in dextrose 5 % 50 mL IVPB  Status:  Discontinued     1 g 100 mL/hr over 30 Minutes Intravenous Every 24 hours 03/30/13 1612 03/30/13 1622   03/31/13 0600  cefTRIAXone (ROCEPHIN) 2 g in dextrose 5 % 50 mL IVPB     2 g 100 mL/hr over 30 Minutes Intravenous Every 12 hours 03/30/13 1927     03/30/13 2200  aztreonam (AZACTAM) 1 g in dextrose 5 % 50 mL IVPB  Status:  Discontinued     1 g 100 mL/hr over 30 Minutes Intravenous 3 times per day 03/30/13 1441 03/30/13 1612   03/30/13 1800  acyclovir (ZOVIRAX) 480 mg in dextrose 5 % 100 mL IVPB     480 mg 109.6 mL/hr over 60 Minutes Intravenous  Once 03/30/13 1432 03/30/13 2153   03/30/13 1500  cefTRIAXone (ROCEPHIN) 2 g in dextrose 5 % 50 mL IVPB     2 g 100 mL/hr over 30 Minutes Intravenous  Once 03/30/13 1432 03/30/13 1839   03/30/13 1230  levofloxacin (LEVAQUIN) IVPB 750 mg  Status:  Discontinued     750 mg 100 mL/hr over 90 Minutes Intravenous  Once 03/30/13 1222 03/30/13 1622  03/30/13 1230  aztreonam (AZACTAM) 2 g in dextrose 5 % 50 mL IVPB     2 g 100 mL/hr over 30 Minutes Intravenous  Once 03/30/13 1222 03/30/13 1331   03/30/13 1230  vancomycin (VANCOCIN) IVPB 1000 mg/200 mL premix     1,000 mg 200 mL/hr over 60 Minutes Intravenous  Once 03/30/13 1222 03/30/13 1552      Assessment:  79 yo F admitted 8/19 with r/o sepsis, concern for meningitis on vancomycin and ceftriaxone  8/19 >> Aztreonam >> 8/19 8/19 >> Levaquin >> 8/19 8/19 >> Vanc >> 8/19 >> Ceftriaxone >> 8/19 >> Acyclovir IV x1  Tmax: afeb WBC: increasing, 20.4 (off dexamethasone 8/21) Renal: SCr 1.35, CrCl ~ 27 (Baseline SCr ~ 1.3-1.5) Lactic acid: 3.9  Micro: 8/19 MRSA: neg 8/19 Blood x2: NGTD 8/19 Urine: NG  Goal of Therapy:  Vancomycin trough level 15-20 mcg/ml  Dosing Levels: 8/22: 1400 VT = 16.3 mcg/ml on vancomycin 1gm IV q24h (prior to 4th dose)  Plan:  Day #4 vancomycin/Ceftriaxone  Vancomycin trough currently therapeutic for indication, may still see some drug accumulation so would recheck early next week if remains on vancomcyin  Follow-up length of therapy  Juliette Alcide, PharmD, BCPS.   Pager: [phone] 04/02/2013 3:02 PM

## 2013-04-02 NOTE — Progress Notes (Signed)
[facility] PROGRESS NOTE  Allison Rodgers ZOX:[id number] DOB: [birth year] DOA: 03/30/2013 PCP: Sandrea Hughs, MD  Brief narrative: 79 -year-old female with past medical history of diabetes, morbid obesity, esophageal strictures hypertension, chronic kidney disease who presented to [facility] ED 03/30/2013 with fever of 102 F and altered mental status. Patient was found to be in DKA. In addition patient had elevated lactic acid, creatinine was 1.7 which is around patient's baseline. Patient was started on empiric antibiotics, Rocephin and vancomycin for possible bacterial meningitis. In addition patient was given acyclovir for possible viral meningitis. LP was attempted but unsuccessful due to the scar at the site of lumbar puncture. CT head revealed moderate atrophy but no acute intracranial abnormalities. Chest x-ray showed mild left lower lobe atelectasis. Urinalysis was unremarkable. Patient has been under critical care medicine care and has been transferred to [facility] starting 04/02/2013. Over past 24 hours he was noted by nursing staff that patient had potential is stool in the vagina. CT abdomen, pelvis did not reveal definite fistula. No further stool in vagina reported.  Assessment/Plan:  Principal Problem:   Acute encephalopathy - likely meningitis, encephalitis, delirium, advancing dementia - CXR showed mild left lower lobe atelectasis but no acute cardiopulmonary process - urinalysis unremarkable - remains afebrile on current antibiotic regimen - need PT evaluation and likely discharge to SNF   Bacteria Meningitis/Viral Encephalitis - on vancomycin and rocephin - Has completed course of acyclovir for possible viral meningitis Active Problems:   Diabetic ketoacidosis - Poorly controlled diabetes with renal manifestations - A1c 03/24/2013 9.9 indicating poor glycemic control - Diabetic ketoacidosis now resolved; continue sliding scale insulin in addition to Lantus 30 units at  bedtime   Morbid obesity - Nutrition consult ordered   HYPERTENSION  - Blood pressure 147/62 - May continue spironolactone 100 mg twice daily and Lasix 80 mg daily by mouth   Dementia - Stable, patient is back to be mental status where she is oriented to time, place and person   CKD (chronic kidney disease), stage III - Patient's baseline creatinine appears to be 1.7 and creatinine on this admission 1.7 - Creatinine trending down to 1.35 today   GERD - Start Protonix 40 mg IV daily  Code Status: full code Family Communication: no family at the bedside Disposition Plan: needs PT evaluation  Manson Passey, MD  [facility] Pager [phone]  If 7PM-7AM, please contact night-coverage [email or web address] Password [facility] 04/02/2013, 6:52 AM   LOS: 3 days   Consultants:  Initially under PCCM, transferred to [facility] 04/02/2013  Procedures:  None   Antibiotics:  Rocephin 8/19>>>   Vanco 8/19>>>  Acyclovir 8/19>>> 8/19    HPI/Subjective: Had questionable stool in vagina. No other significant overnight events.   Objective: Filed Vitals:   04/01/13 1237 04/01/13 1410 04/01/13 2139 04/02/13 0459  BP: 124/64 128/50 138/74 147/62  Pulse: 67 60 51 72  Temp: 97.7 F (36.5 C) 97.6 F (36.4 C) 97.6 F (36.4 C) 98.1 F (36.7 C)  TempSrc: Oral Oral Axillary Oral  Resp: 16 16 16 16   Height: 5' (1.524 m)     Weight: 82.555 kg (182 lb)     SpO2: 100% 100% 98% 100%    Intake/Output Summary (Last 24 hours) at 04/02/13 8295 Last data filed at 04/01/13 1459  Gross per 24 hour  Intake 244.71 ml  Output    160 ml  Net  84.71 ml    Exam:   General:  Pt is alert, follows commands appropriately, not in  acute distress  Cardiovascular: Regular rate and rhythm, S1/S2 appreciated  Respiratory: Clear to auscultation bilaterally, no wheezing, no crackles, no rhonchi  Abdomen: Soft, non tender, non distended, bowel sounds present, no guarding  Extremities: slight edema, pulses DP and PT palpable  bilaterally  Neuro: Grossly nonfocal  Data Reviewed: Basic Metabolic Panel:  Recent Labs Lab 03/30/13 2130 03/31/13 0020 03/31/13 0430 04/01/13 0445 04/02/13 0545  NA 135 134* 135 137 136  K 3.9 3.9 3.8 3.6 3.7  CL 99 98 99 103 101  CO2 25 24 25 25 25   GLUCOSE 167* 149* 139* 129* 148*  BUN 32* 32* 32* 33* 39*  CREATININE 1.50* 1.49* 1.46* 1.29* 1.35*  CALCIUM 9.6 9.4 9.6 9.0 9.0  MG  --   --  1.5  --  1.7  PHOS  --   --   --   --  3.2   Liver Function Tests:  Recent Labs Lab 03/30/13 1305  AST 20  ALT 13  ALKPHOS 113  BILITOT 0.7  PROT 9.2*  ALBUMIN 4.3   No results found for this basename: LIPASE, AMYLASE,  in the last 168 hours  Recent Labs Lab 03/30/13 1450  AMMONIA 20   CBC:  Recent Labs Lab 03/30/13 1305 03/30/13 1728 03/31/13 0430 04/01/13 0445 04/02/13 0545  WBC 10.2 12.1* 17.9* 23.1* 20.4*  NEUTROABS 9.6*  --   --   --   --   HGB 15.6* 15.8* 14.5 13.8 14.0  HCT 48.1* 48.1* 44.3 41.9 42.6  MCV 72.9* 72.5* 71.9* 72.4* 72.0*  PLT 144* 139* 166 163 151   Cardiac Enzymes:  Recent Labs Lab 03/30/13 1305  TROPONINI <0.30   BNP: No components found with this basename: POCBNP,  CBG:  Recent Labs Lab 04/01/13 0746 04/01/13 0857 04/01/13 1232 04/01/13 1658 04/01/13 2143  GLUCAP 199* 199* 208* 367* 136*    URINE CULTURE     Status: None   Collection Time    03/24/13 12:03 PM      Result Value Range Status   Colony Count 45,000 COLONIES/ML   Final   Organism ID, Bacteria Multiple bacterial morphotypes present, none   Final   Organism ID, Bacteria predominant. Suggest appropriate recollection if    Final   Organism ID, Bacteria clinically indicated.   Final  URINE CULTURE     Status: None   Collection Time    03/30/13 12:16 PM      Result Value Range Status   Specimen Description URINE, CATHETERIZED   Final   Value: NO GROWTH     Performed at [facility]   Report Status 03/31/2013 FINAL   Final  CULTURE, BLOOD  (ROUTINE X 2)     Status: None   Collection Time    03/30/13 12:50 PM      Result Value Range Status   Specimen Description BLOOD RIGHT HAND   Final   Special Requests BOTTLES DRAWN AEROBIC AND ANAEROBIC   Final   Culture  Setup Time     Final   Value: 03/30/2013 17:22     Performed at [facility]   Culture     Final   Value:        BLOOD CULTURE RECEIVED NO GROWTH TO DATE CULTURE WILL BE HELD FOR 5 DAYS BEFORE ISSUING A FINAL NEGATIVE REPORT     Performed at [facility]   Report Status PENDING   Incomplete  CULTURE, BLOOD (ROUTINE X 2)  Status: None   Collection Time    03/30/13  1:05 PM      Result Value Range Status   Specimen Description BLOOD RIGHT FOREARM   Final   Special Requests BOTTLES DRAWN AEROBIC ONLY   Final   Culture  Setup Time     Final   Value: 03/30/2013 17:22     Performed at [facility]   Culture     Final   Value:        BLOOD CULTURE RECEIVED NO GROWTH TO DATE CULTURE WILL BE HELD FOR 5 DAYS BEFORE ISSUING A FINAL NEGATIVE REPORT     Performed at [facility]   Report Status PENDING   Incomplete  MRSA PCR SCREENING     Status: None   Collection Time    03/30/13  4:52 PM      Result Value Range Status   MRSA by PCR NEGATIVE  NEGATIVE Final     Studies: Ct Abdomen Pelvis Wo Contrast 04/01/2013   * IMPRESSION: 1.  Gas in the vagina, immediately adjacent to the mid sigmoid colon, without demonstration of   definite fistula. Rectal contrast administration may be useful to more definitively exclude persistent   colovaginal fistula. 2.  Descending and sigmoid diverticulosis.     Mr Shirlee Latch Wo Contrast 04/01/2013     IMPRESSION: Motion degraded exam demonstrating no acute stroke or hemorrhage.  Mild dural thickening around the foramen magnum of uncertain significance.  In the setting of fever and encephalopathy, meningitis cannot completely be excluded.  If there are no contraindications, lumbar puncture may be helpful  in further evaluation.  MRA HEAD  Findings: Motion degraded exam.  Gross patency of the internal carotid arteries is established.  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  There may be superimposed basilar stenosis in its mid and distal portions as the vessel becomes increasingly attenuated more cephalad.  Right vertebral is the dominant/sole contributor to the basilar although the left vertebral is patent at the skull base level.  There is no obvious proximal occlusion of the middle or posterior cerebral arteries.  There is poor flow related enhancement in the proximal left anterior cerebral artery which could represent intracranial atherosclerotic change.  IMPRESSION: Suspected mid to distal basilar stenosis on this motion degraded exam.  Gross patency of internal carotid arteries is established.   Original Report Authenticated By: Davonna Belling, M.D.   Mr Brain Wo Contrast 04/01/2013   *  IMPRESSION: Motion degraded exam demonstrating no acute stroke or hemorrhage.  Mild dural thickening around the foramen magnum of uncertain significance.  In the setting of fever and encephalopathy, meningitis cannot completely be excluded.  If there are no contraindications, lumbar puncture may be helpful in further evaluation.  MRA HEAD  Findings: Motion degraded exam.  Gross patency of the internal carotid arteries is established.  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  There may be superimposed basilar stenosis in its mid and distal portions as the vessel becomes increasingly attenuated more cephalad.  Right vertebral is the dominant/sole contributor to the basilar although the left vertebral is patent at the skull base level.  There is no obvious proximal occlusion of the middle or posterior cerebral arteries.  There is poor flow related enhancement in the proximal left anterior cerebral artery which could represent intracranial  atherosclerotic change.  IMPRESSION: Suspected mid to distal basilar stenosis on this motion  degraded exam.  Gross patency of internal carotid arteries is established.   Original Report Authenticated By: Davonna Belling, M.D.   Dg Chest Port 1 View  04/01/2013   *RADIOLOGY REPORT*  Clinical Data: Atelectasis  PORTABLE CHEST - 1 VIEW  Comparison: 03/31/2013  Findings: Mild left lower lobe atelectasis is unchanged.  Right lung remains clear.  Negative for heart failure or effusion.  Left subclavian central venous catheter tip in the SVC at the cavoatrial junction, unchanged.  No pneumothorax.  IMPRESSION: Mild left lower lobe atelectasis, unchanged.  No significant change from yesterday.   Original Report Authenticated By: Janeece Riggers, M.D.    Scheduled Meds: . aspirin  81 mg Oral Daily  . cefTRIAXone   2 g Intravenous Q12H  . furosemide  80 mg Oral Daily  . insulin aspart  0-20 Units Subcutaneous TID WC  . insulin aspart  0-5 Units Subcutaneous QHS  . insulin glargine  30 Units Subcutaneous QHS  . spironolactone  100 mg Oral BID WC  . vancomycin  1,000 mg Intravenous Q24H

## 2013-04-02 NOTE — Progress Notes (Addendum)
Clinical Social Work Department CLINICAL SOCIAL WORK PLACEMENT NOTE 04/02/2013  Patient:  ANETH, SCHLAGEL  Account Number:  [id number] Admit date:  03/30/2013  Clinical Social Worker:  Jacelyn Grip  Date/time:  04/02/2013 03:01 PM  Clinical Social Work is seeking post-discharge placement for this patient at the following level of care:   SKILLED NURSING   (*CSW will update this form in Epic as items are completed)   04/02/2013  Patient/family provided with [facility] Department of Clinical Social Work's list of facilities offering this level of care within the geographic area requested by the patient (or if unable, by the patient's family).  04/02/2013  Patient/family informed of their freedom to choose among providers that offer the needed level of care, that participate in Medicare, Medicaid or managed care program needed by the patient, have an available bed and are willing to accept the patient.  04/02/2013  Patient/family informed of [facility]' ownership interest in [facility], as well as of the fact that they are under no obligation to receive care at this facility.  PASARR submitted to EDS on 04/02/2013 PASARR number received from [employer] on 04/02/2013  FL2 transmitted to all facilities in geographic area requested by pt/family on  04/02/2013 FL2 transmitted to all facilities within larger geographic area on   Patient informed that his/her managed care company has contracts with or will negotiate with  certain facilities, including the following:     Patient/family informed of bed offers received:  04/02/2013 Patient chooses bed at [facility] Physician recommends and patient chooses bed at    Patient to be transferred to  on  [facility] on 04/05/2013 Patient to be transferred to facility by pt son via private vehicle  The following physician request were entered in Epic:   Additional Comments:    Jacklynn Lewis, MSW, LCSWA  Clinical Social  Work [phone]

## 2013-04-02 NOTE — Evaluation (Signed)
Physical Therapy Evaluation Patient Details Name: ARNETTE DRIGGS MRN: [id number] DOB: [birth year] Today's Date: 04/02/2013 Time: 4098-1191 PT Time Calculation (min): 33 min  PT Assessment / Plan / Recommendation History of Present Illness  79 y/o F with PMH of DJD, HTN, CAD, HLD, DM, Obesity, esophageal strictures who presented to [facility] on 8/19 after being found by her neighbor lethargic / confused.  She was reportedly normal yesterday per family.    Clinical Impression  Pt demonstrated difficulty with functional mobility du to the impairments listed below, but shows potential to increase in independence and safety.    PT Assessment  Patient needs continued PT services    Follow Up Recommendations  SNF    Does the patient have the potential to tolerate intense rehabilitation      Barriers to Discharge Decreased caregiver support      Equipment Recommendations  None recommended by PT    Recommendations for Other Services OT consult   Frequency Min 3X/week    Precautions / Restrictions Precautions Precautions: Fall Restrictions Weight Bearing Restrictions: No   Pertinent Vitals/Pain No c/o pain      Mobility  Bed Mobility Bed Mobility: Supine to Sit Supine to Sit: 3: Mod assist Details for Bed Mobility Assistance: pt needs assist to move legs to the edge of the bed Transfers Transfers: Sit to Stand;Stand to Sit Sit to Stand: 4: Min assist Details for Transfer Assistance: needs consistent cues to push up with arms to stand Ambulation/Gait Ambulation/Gait Assistance: 4: Min assist Ambulation Distance (Feet): 25 Feet Assistive device: Rolling walker Ambulation/Gait Assistance Details: pt needs assist to direct walker, keep feet inside walker during turns and cues to step up into walker Gait Pattern: Step-through pattern;Decreased step length - right;Decreased step length - left;Trunk flexed Gait velocity: decreased General Gait Details: Pt needs assist with RW  but is able to attempt corrections and wants to improve distance Stairs: No Wheelchair Mobility Wheelchair Mobility: No    Exercises     PT Diagnosis: Difficulty walking;Generalized weakness;Abnormality of gait  PT Problem List: Decreased strength;Decreased activity tolerance;Decreased balance;Decreased mobility;Decreased safety awareness;Decreased cognition PT Treatment Interventions: DME instruction;Gait training;Functional mobility training;Therapeutic activities;Therapeutic exercise;Patient/family education     PT Goals(Current goals can be found in the care plan section) Acute Rehab PT Goals Patient Stated Goal: to return to home the way she was before PT Goal Formulation: With patient Time For Goal Achievement: 04/16/13 Potential to Achieve Goals: Good  Visit Information  Last PT Received On: 04/02/13 History of Present Illness: 79 y/o F with PMH of DJD, HTN, CAD, HLD, DM, Obesity, esophageal strictures who presented to [facility] on 8/19 after being found by her neighbor lethargic / confused.  She was reportedly normal yesterday per family.         Prior Functioning  Home Living Family/patient expects to be discharged to:: Private residence Living Arrangements: Alone (son will be able to be here for a few weeks) Available Help at Discharge: Family Type of Home: House Home Access: Stairs to enter [employer] of Steps: 2 Home Layout: One level Home Equipment: Cane - single point;Walker - 2 wheels Prior Function Level of Independence: Independent Comments: pt reports she has been having trouble contolling her bladder for about 3 weeks Communication Communication: No difficulties    Cognition  Cognition Arousal/Alertness: Awake/alert Behavior During Therapy: WFL for tasks assessed/performed Overall Cognitive Status: Impaired/Different from baseline Area of Impairment: Orientation;Memory;Safety/judgement;Problem solving Orientation Level:  Situation Memory: Decreased short-term memory Safety/Judgement:  Decreased awareness of safety;Decreased awareness of deficits Problem Solving: Slow processing    Extremity/Trunk Assessment Lower Extremity Assessment Lower Extremity Assessment: Generalized weakness;Overall [facility] for tasks assessed Cervical / Trunk Assessment Cervical / Trunk Assessment: Other exceptions Cervical / Trunk Exceptions:  (obese abdomen)   Balance Balance Balance Assessed: Yes Static Sitting Balance Static Sitting - Balance Support: No upper extremity supported Static Sitting - Level of Assistance: 5: Stand by assistance Static Standing Balance Static Standing - Balance Support: Bilateral upper extremity supported;During functional activity Static Standing - Level of Assistance: 4: Min assist Static Standing - Comment/# of Minutes: several minutes for perineal hygiene  End of Session PT - End of Session Equipment Utilized During Treatment: Gait belt Activity Tolerance: Patient tolerated treatment well Patient left: in chair;with family/visitor present Nurse Communication: Mobility status  GP    Rosey Bath K. [place], [place] [phone] 04/02/2013, 2:41 PM

## 2013-04-02 NOTE — Progress Notes (Signed)
Clinical Social Work Department BRIEF PSYCHOSOCIAL ASSESSMENT 04/02/2013  Patient:  Allison Rodgers, Allison Rodgers     Account Number:  [id number]     Admit date:  03/30/2013  Clinical Social Worker:  Jacelyn Grip  Date/Time:  04/02/2013 02:00 PM  Referred by:  Physician  Date Referred:  04/02/2013 Referred for  SNF Placement   Other Referral:   Interview type:  Patient Other interview type:   and patient family at bedside    PSYCHOSOCIAL DATA Living Status:  ALONE Admitted from facility:   Level of care:   Primary support name:  Ulysses Tess/son/[phone] Primary support relationship to patient:  CHILD, ADULT Degree of support available:   strong    CURRENT CONCERNS Current Concerns  Post-Acute Placement   Other Concerns:    SOCIAL WORK ASSESSMENT / PLAN CSW received referral for New SNF placement.    CSW met with pt, pt son, and pt friend at bedside to discuss. Per pt and pt family report, pt lives alone and CSW discussed recommendation for ST rehab placement at discharge. Pt and pt son agreeable to SNF search in [facility].    CSW completed FL2 and initiated SNF search to [facility].    CSW to follow up with pt and pt son re: bed offers.    CSW to fax pt clinical information to [facility].    Pt insurance requires authorization prior to d/c to SNF.    CSW to facilitate pt discharge needs when pt medically ready for discharge.   Assessment/plan status:  Psychosocial Support/Ongoing Assessment of Needs Other assessment/ plan:   discharge planning   Information/referral to community resources:   [facility] list    PATIENT'S/FAMILY'S RESPONSE TO PLAN OF CARE: Pt alert and oriented x 2. Pt son appears supportive and actively involved in pt care. Pt and pt son eager to review bed offers to determine which facility pt will d/c to.     Jacklynn Lewis, MSW, LCSWA  Clinical Social Work [phone]

## 2013-04-02 NOTE — Telephone Encounter (Signed)
I spoke with pt son and he states he does not remember leaving this requests and states nothing is needed.Carron Curie, CMA

## 2013-04-03 LAB — GLUCOSE, CAPILLARY
Glucose-Capillary: 116 mg/dL — ABNORMAL HIGH (ref 70–99)
Glucose-Capillary: 152 mg/dL — ABNORMAL HIGH (ref 70–99)
Glucose-Capillary: 261 mg/dL — ABNORMAL HIGH (ref 70–99)

## 2013-04-03 LAB — BASIC METABOLIC PANEL
GFR calc Af Amer: 38 mL/min — ABNORMAL LOW (ref >90)
GFR calc non Af Amer: 33 mL/min — ABNORMAL LOW (ref >90)
Potassium: 3.2 mEq/L — ABNORMAL LOW (ref 3.5–5.1)
Sodium: 134 mEq/L — ABNORMAL LOW (ref 135–145)

## 2013-04-03 LAB — CBC
MCHC: 32.7 g/dL (ref 30.0–36.0)
Platelets: 152 10*3/uL (ref 150–400)
RDW: 12.8 % (ref 11.5–15.5)
WBC: 11.6 10*3/uL — ABNORMAL HIGH (ref 4.0–10.5)

## 2013-04-03 MED ORDER — POTASSIUM CHLORIDE CRYS ER 20 MEQ PO TBCR
40.0000 meq | EXTENDED_RELEASE_TABLET | Freq: Once | ORAL | Status: AC
  Administered 2013-04-03: 40 meq via ORAL
  Filled 2013-04-03: qty 2

## 2013-04-03 MED ORDER — ONDANSETRON HCL 4 MG/2ML IJ SOLN
4.0000 mg | Freq: Four times a day (QID) | INTRAMUSCULAR | Status: DC | PRN
  Administered 2013-04-03: 4 mg via INTRAVENOUS
  Filled 2013-04-03: qty 2

## 2013-04-03 MED ORDER — PANTOPRAZOLE SODIUM 40 MG PO TBEC
40.0000 mg | DELAYED_RELEASE_TABLET | Freq: Every day | ORAL | Status: DC
  Administered 2013-04-04 – 2013-04-05 (×2): 40 mg via ORAL
  Filled 2013-04-03 (×2): qty 1

## 2013-04-03 NOTE — Progress Notes (Signed)
Reviewed bed offers with Pt's son via phone.  No new bed offers, at this time.  Pt's son stated that he's leaning toward [facility] and [facility].  CSW thanked Pt's son for his time.  Providence Crosby, LCSWA Clinical Social Work [phone]

## 2013-04-03 NOTE — Progress Notes (Signed)
This patient is receiving Protonix. Based on criteria approved by the Pharmacy and Therapeutics Committee, this medication is being converted to the equivalent oral dose form. These criteria include:   . The patient is eating (either orally or per tube) and/or has been taking other orally administered medications for at least 24 hours.  . This patient has no evidence of active gastrointestinal bleeding or impaired GI absorption (gastrectomy, short bowel, patient on TNA or NPO).   If you have questions about this conversion, please contact the pharmacy department.  Berkley Harvey, [facility] 04/03/2013 12:13 PM

## 2013-04-03 NOTE — Progress Notes (Addendum)
[facility] PROGRESS NOTE  Allison Rodgers ZOX:[id number] DOB: 06-29-1924 DOA: 03/30/2013 PCP: Sandrea Hughs, MD  Brief narrative: 79 -year-old female with past medical history of diabetes, morbid obesity, esophageal strictures hypertension, chronic kidney disease who presented to [facility] ED 03/30/2013 with fever of 102 F and altered mental status. Patient was found to be in DKA. In addition patient had elevated lactic acid, creatinine was 1.7 which is around patient's baseline. Patient was started on empiric antibiotics, Rocephin and vancomycin for possible bacterial meningitis. In addition patient was given acyclovir for possible viral meningitis. LP was attempted but unsuccessful due to the scar at the site of lumbar puncture. CT head revealed moderate atrophy but no acute intracranial abnormalities. Chest x-ray showed mild left lower lobe atelectasis. Urinalysis was unremarkable. Patient has been under critical care medicine care and has been transferred to [facility] starting 04/02/2013. Over past 24 hours he was noted by nursing staff that patient had potential is stool in the vagina. CT abdomen, pelvis did not reveal definite fistula. No further stool in vagina reported. At this time we are awaiting SNF placement.  Assessment/Plan:   Principal Problem:  Acute encephalopathy  - likely secondary to viral and/or bacterial meningitis, encephalitis, delirium, advancing dementia  - please note that LP was unsuccesful due to scarring at the LP site - CXR showed mild left lower lobe atelectasis but no acute cardiopulmonary process  - urinalysis unremarkable  - remains afebrile on current antibiotic regimen with vanco and rocephin - SNF placement per PT evaluation recommendations; SW assisting discharge plan Bacteria Meningitis/Viral Encephalitis  - on vancomycin and rocephin for bacterial meningitis - Has completed course of acyclovir for possible viral meningitis  Active Problems:   Diabetic ketoacidosis  - Poorly controlled diabetes with renal manifestations  - A1c 03/24/2013 9.9 indicating poor glycemic control  - Diabetic ketoacidosis now resolved; continue sliding scale insulin in addition to Lantus 30 units at bedtime  - CBG's in past 24 hours: 184, 254, 175 Morbid obesity  - Nutrition consulted HYPERTENSION  - Blood pressure 151/52 - Continue spironolactone 100 mg twice daily and Lasix 80 mg daily by mouth  Dementia  - Stable, patient is back to be mental status where she is oriented to time, place and person  CKD (chronic kidney disease), stage III  - Patient's baseline creatinine appears to be 1.7 and creatinine on this admission 1.7  - Creatinine trending down   GERD  - Continue Protonix 40 mg IV daily   Code Status: full code  Family Communication: no family at the bedside  Disposition Plan: SNF once bed available  Manson Passey, MD  [facility]  Pager [phone]   Consultants:  Initially under PCCM, transferred to [facility] 04/02/2013 Procedures:  None  Antibiotics:  Rocephin 8/19>>>  Vanco 8/19>>>  Acyclovir 8/19>>> 8/19    If 7PM-7AM, please contact night-coverage [email or web address] Password [facility] 04/03/2013, 6:23 AM   LOS: 4 days    HPI/Subjective: No acute overnight events.  Objective: Filed Vitals:   04/02/13 0746 04/02/13 1425 04/02/13 2231 04/03/13 0611  BP:  127/57 129/63 151/52  Pulse:  63 104 54  Temp:  98.2 F (36.8 C) 98.5 F (36.9 C) 97.9 F (36.6 C)  TempSrc:  Axillary Oral Oral  Resp:  16 16 16   Height:      Weight: 81.1 kg (178 lb 12.7 oz)   81.1 kg (178 lb 12.7 oz)  SpO2:  100% 98% 97%    Intake/Output Summary (Last 24 hours) at  04/03/13 0623 Last data filed at 04/02/13 1425  Gross per 24 hour  Intake    480 ml  Output      0 ml  Net    480 ml    Exam:   General:  Pt is sleeping, not in acute distress  Cardiovascular: Regular rate and rhythm, S1/S2 appreciated  Respiratory: Clear to auscultation bilaterally, no  wheezing, no crackles, no rhonchi  Abdomen: Soft, non tender, non distended, bowel sounds present, no guarding  Extremities: LE non pitting edema, pulses DP and PT palpable bilaterally  Neuro: Grossly nonfocal  Data Reviewed: Basic Metabolic Panel:  Recent Labs Lab 03/30/13 2130 03/31/13 0020 03/31/13 0430 04/01/13 0445 04/02/13 0545  NA 135 134* 135 137 136  K 3.9 3.9 3.8 3.6 3.7  CL 99 98 99 103 101  CO2 25 24 25 25 25   GLUCOSE 167* 149* 139* 129* 148*  BUN 32* 32* 32* 33* 39*  CREATININE 1.50* 1.49* 1.46* 1.29* 1.35*  CALCIUM 9.6 9.4 9.6 9.0 9.0  MG  --   --  1.5  --  1.7  PHOS  --   --   --   --  3.2   Liver Function Tests:  Recent Labs Lab 03/30/13 1305  AST 20  ALT 13  ALKPHOS 113  BILITOT 0.7  PROT 9.2*  ALBUMIN 4.3   No results found for this basename: LIPASE, AMYLASE,  in the last 168 hours  Recent Labs Lab 03/30/13 1450  AMMONIA 20   CBC:  Recent Labs Lab 03/30/13 1305 03/30/13 1728 03/31/13 0430 04/01/13 0445 04/02/13 0545  WBC 10.2 12.1* 17.9* 23.1* 20.4*  NEUTROABS 9.6*  --   --   --   --   HGB 15.6* 15.8* 14.5 13.8 14.0  HCT 48.1* 48.1* 44.3 41.9 42.6  MCV 72.9* 72.5* 71.9* 72.4* 72.0*  PLT 144* 139* 166 163 151   Cardiac Enzymes:  Recent Labs Lab 03/30/13 1305  TROPONINI <0.30   BNP: No components found with this basename: POCBNP,  CBG:  Recent Labs Lab 04/01/13 2143 04/02/13 0746 04/02/13 1220 04/02/13 1727 04/02/13 2202  GLUCAP 136* 139* 184* 254* 175*    Recent Results (from the past 240 hour(s))  URINE CULTURE     Status: None   Collection Time    03/24/13 12:03 PM      Result Value Range Status   Colony Count 45,000 COLONIES/ML   Final   Organism ID, Bacteria Multiple bacterial morphotypes present, none   Final   Organism ID, Bacteria predominant. Suggest appropriate recollection if    Final   Organism ID, Bacteria clinically indicated.   Final  URINE CULTURE     Status: None   Collection Time     03/30/13 12:16 PM      Result Value Range Status   Specimen Description URINE, CATHETERIZED   Final   Special Requests NONE   Final   Culture  Setup Time     Final   Value: 03/30/2013 16:05     Performed at [facility] Count     Final   Value: NO GROWTH     Performed at [facility]   Culture     Final   Value: NO GROWTH     Performed at [facility]   Report Status 03/31/2013 FINAL   Final  CULTURE, BLOOD (ROUTINE X 2)     Status: None   Collection Time    03/30/13  12:50 PM      Result Value Range Status   Specimen Description BLOOD RIGHT HAND   Final   Special Requests BOTTLES DRAWN AEROBIC AND ANAEROBIC   Final   Culture  Setup Time     Final   Value: 03/30/2013 17:22     Performed at [facility]   Culture     Final   Value:        BLOOD CULTURE RECEIVED NO GROWTH TO DATE CULTURE WILL BE HELD FOR 5 DAYS BEFORE ISSUING A FINAL NEGATIVE REPORT     Performed at [facility]   Report Status PENDING   Incomplete  CULTURE, BLOOD (ROUTINE X 2)     Status: None   Collection Time    03/30/13  1:05 PM      Result Value Range Status   Specimen Description BLOOD RIGHT FOREARM   Final   Special Requests BOTTLES DRAWN AEROBIC ONLY   Final   Culture  Setup Time     Final   Value: 03/30/2013 17:22     Performed at [facility]   Culture     Final   Value:        BLOOD CULTURE RECEIVED NO GROWTH TO DATE CULTURE WILL BE HELD FOR 5 DAYS BEFORE ISSUING A FINAL NEGATIVE REPORT     Performed at [facility]   Report Status PENDING   Incomplete  MRSA PCR SCREENING     Status: None   Collection Time    03/30/13  4:52 PM      Result Value Range Status   MRSA by PCR NEGATIVE  NEGATIVE Final   Comment:            The GeneXpert MRSA Assay (FDA     approved for NASAL specimens     only), is one component of a     comprehensive MRSA colonization     surveillance program. It is not     intended to diagnose MRSA      infection nor to guide or     monitor treatment for     MRSA infections.     Performed at [facility]     Studies: Ct Abdomen Pelvis Wo Contrast  04/01/2013   *RADIOLOGY REPORT*  Clinical Data: Possible rectovaginal fistula.  Previous repair.  CT ABDOMEN AND PELVIS WITHOUT CONTRAST  Technique:  Multidetector CT imaging of the abdomen and pelvis was performed following the standard protocol without intravenous contrast.  Comparison: 01/01/2013  Findings: Visualized lung bases clear.  Surgical clips in the gallbladder fossa.  Patchy aortoiliac arterial plaque. Unremarkable uninfused evaluation of liver, spleen, adrenal glands, kidneys.  Pancreatic parenchymal atrophy without focal lesion. Stomach, small bowel, and colon are nondilated.  There are innumerable diverticula from the descending and sigmoid portions of the colon. Previous hysterectomy.  The mid sigmoid colon is immediately adjacent to the vaginal cuff, without intervening fat plane.  There is gas in the vagina. No definite fistula is identified.  The urinary bladder is physiologically distended.  Bilateral pelvic phleboliths.  No ascites.  No free air.  No adenopathy. Spondylitic changes in the visualized lower thoracic and lumbar spine with grade 1 anterolisthesis L4-5 related to facet disease.  IMPRESSION: 1.  Gas in the vagina, immediately adjacent to the mid sigmoid colon, without demonstration of   definite fistula. Rectal contrast administration may be useful to more definitively exclude persistent   colovaginal fistula. 2.  Descending  and sigmoid diverticulosis.   Original Report Authenticated By: D. Andria Rhein, MD   Mr Maxine Glenn Head Wo Contrast  04/01/2013   *RADIOLOGY REPORT*  Clinical Data:  Altered mental status. Acute encephalopathy with fever.  Diabetic ketoacidosis.  MRI HEAD WITHOUT CONTRAST MRA HEAD WITHOUT CONTRAST  Technique:  Multiplanar, multiecho pulse sequences of the brain and surrounding structures were obtained  without intravenous contrast. Angiographic images of the head were obtained using MRA technique without contrast.  Comparison:  03/30/2013.  MRI HEAD  Findings:  There is no evidence for acute infarction, intracranial hemorrhage, mass lesion, hydrocephalus, or extra-axial fluid.  The patient had difficulty remaining motionless for the study.  Images are suboptimal.  Small or subtle lesions could be overlooked.  There is no evidence for acute infarction, intracranial hemorrhage, mass lesion, hydrocephalus, or extra-axial fluid.  Moderate to severe atrophy is present.  Mild chronic microvascular ischemic change is present in the periventricular and subcortical white matter.  Flow voids are  maintained in the carotid and basilar arteries. There is no obvious pituitary or cerebellar abnormality. No large vessel infarct.  No osseous lesions.  No acute sinus or mastoid disease.  On midline sagittal T1-weighted images, there is mild dural thickening ventrally and dorsally around the foramen magnum (arrows).  It is not clear that this is acute  but in the setting of encephalopathy and fever, meningitis cannot completely be excluded.  No definite CSF abnormality can be seen on motion degraded FLAIR images.  If the patient is not improving, consider lumbar puncture for further evaluation.  Compared to prior CT, the appearance is similar.  IMPRESSION: Motion degraded exam demonstrating no acute stroke or hemorrhage.  Mild dural thickening around the foramen magnum of uncertain significance.  In the setting of fever and encephalopathy, meningitis cannot completely be excluded.  If there are no contraindications, lumbar puncture may be helpful in further evaluation.  MRA HEAD  Findings: Motion degraded exam.  Gross patency of the internal carotid arteries is established.  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  There may be superimposed basilar stenosis in its mid and distal portions as  the vessel becomes increasingly attenuated more cephalad.  Right vertebral is the dominant/sole contributor to the basilar although the left vertebral is patent at the skull base level.  There is no obvious proximal occlusion of the middle or posterior cerebral arteries.  There is poor flow related enhancement in the proximal left anterior cerebral artery which could represent intracranial atherosclerotic change.  IMPRESSION: Suspected mid to distal basilar stenosis on this motion degraded exam.  Gross patency of internal carotid arteries is established.   Original Report Authenticated By: Davonna Belling, M.D.   Mr Brain Wo Contrast  04/01/2013   *RADIOLOGY REPORT*  Clinical Data:  Altered mental status. Acute encephalopathy with fever.  Diabetic ketoacidosis.  MRI HEAD WITHOUT CONTRAST MRA HEAD WITHOUT CONTRAST  Technique:  Multiplanar, multiecho pulse sequences of the brain and surrounding structures were obtained without intravenous contrast. Angiographic images of the head were obtained using MRA technique without contrast.  Comparison:  03/30/2013.  MRI HEAD  Findings:  There is no evidence for acute infarction, intracranial hemorrhage, mass lesion, hydrocephalus, or extra-axial fluid.  The patient had difficulty remaining motionless for the study.  Images are suboptimal.  Small or subtle lesions could be overlooked.  There is no evidence for acute infarction, intracranial hemorrhage, mass lesion, hydrocephalus, or extra-axial fluid.  Moderate to severe atrophy is  present.  Mild chronic microvascular ischemic change is present in the periventricular and subcortical white matter.  Flow voids are  maintained in the carotid and basilar arteries. There is no obvious pituitary or cerebellar abnormality. No large vessel infarct.  No osseous lesions.  No acute sinus or mastoid disease.  On midline sagittal T1-weighted images, there is mild dural thickening ventrally and dorsally around the foramen magnum (arrows).  It  is not clear that this is acute  but in the setting of encephalopathy and fever, meningitis cannot completely be excluded.  No definite CSF abnormality can be seen on motion degraded FLAIR images.  If the patient is not improving, consider lumbar puncture for further evaluation.  Compared to prior CT, the appearance is similar.  IMPRESSION: Motion degraded exam demonstrating no acute stroke or hemorrhage.  Mild dural thickening around the foramen magnum of uncertain significance.  In the setting of fever and encephalopathy, meningitis cannot completely be excluded.  If there are no contraindications, lumbar puncture may be helpful in further evaluation.  MRA HEAD  Findings: Motion degraded exam.  Gross patency of the internal carotid arteries is established.  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  There may be superimposed basilar stenosis in its mid and distal portions as the vessel becomes increasingly attenuated more cephalad.  Right vertebral is the dominant/sole contributor to the basilar although the left vertebral is patent at the skull base level.  There is no obvious proximal occlusion of the middle or posterior cerebral arteries.  There is poor flow related enhancement in the proximal left anterior cerebral artery which could represent intracranial atherosclerotic change.  IMPRESSION: Suspected mid to distal basilar stenosis on this motion degraded exam.  Gross patency of internal carotid arteries is established.   Original Report Authenticated By: Davonna Belling, M.D.    Scheduled Meds: . aspirin  81 mg Oral Daily  . cefTRIAXone   2 g Intravenous Q12H  . furosemide  80 mg Oral Daily  . insulin aspart  0-20 Units Subcutaneous TID WC  . insulin aspart  0-5 Units Subcutaneous QHS  . insulin glargine  30 Units Subcutaneous QHS  . pantoprazole   40 mg Intravenous Q24H  . spironolactone  100 mg Oral BID WC  . vancomycin  1,000 mg Intravenous Q24H

## 2013-04-04 ENCOUNTER — Inpatient Hospital Stay (HOSPITAL_COMMUNITY): Payer: Medicare PPO

## 2013-04-04 DIAGNOSIS — G039 Meningitis, unspecified: Secondary | ICD-10-CM

## 2013-04-04 LAB — GLUCOSE, CAPILLARY

## 2013-04-04 MED ORDER — POTASSIUM CHLORIDE CRYS ER 20 MEQ PO TBCR
40.0000 meq | EXTENDED_RELEASE_TABLET | Freq: Once | ORAL | Status: AC
  Administered 2013-04-04: 40 meq via ORAL
  Filled 2013-04-04: qty 2

## 2013-04-04 MED ORDER — HYDRALAZINE HCL 20 MG/ML IJ SOLN: 5.0000 mg | Freq: Three times a day (TID) | INTRAMUSCULAR | Status: DC | PRN

## 2013-04-04 MED ORDER — LINAGLIPTIN 5 MG PO TABS
5.0000 mg | ORAL_TABLET | Freq: Every day | ORAL | Status: DC
  Administered 2013-04-04 – 2013-04-05 (×2): 5 mg via ORAL
  Filled 2013-04-04 (×2): qty 1

## 2013-04-04 NOTE — Progress Notes (Addendum)
[facility] PROGRESS NOTE  Allison Rodgers OZH:[id number] DOB: [birth year] DOA: 03/30/2013 PCP: Sandrea Hughs, MD  Brief narrative: 79 -year-old female with past medical history of diabetes, morbid obesity, esophageal strictures hypertension, chronic kidney disease who presented to [facility] ED 03/30/2013 with fever of 102 F and altered mental status. Patient was found to be in DKA. In addition patient had elevated lactic acid, creatinine was 1.7 which is around patient's baseline. Patient was started on empiric antibiotics, Rocephin and vancomycin for possible bacterial meningitis. In addition patient was given acyclovir for possible viral meningitis. LP was attempted but unsuccessful due to the scar at the site of lumbar puncture. CT head revealed moderate atrophy but no acute intracranial abnormalities. Chest x-ray showed mild left lower lobe atelectasis. Urinalysis was unremarkable. Patient has been under critical care medicine care and has been transferred to [facility] starting 04/02/2013. Over past 24 hours he was noted by nursing staff that patient had potential is stool in the vagina. CT abdomen, pelvis did not reveal definite fistula. No further stool in vagina reported. At this time we are awaiting SNF placement.   Assessment/Plan:   Principal Problem:  Acute encephalopathy  - likely secondary to viral and/or bacterial meningitis, encephalitis, delirium, advancing dementia  - please note that LP was attempted but unsuccesful due to scarring at the LP site  - CXR showed mild left lower lobe atelectasis but no acute cardiopulmonary process  - urinalysis unremarkable  - remains afebrile on current antibiotic regimen with vanco and rocephin; will transition to PO antibiotics in am - SNF placement per PT evaluation recommendations; SW assisting discharge plan  Bacteria Meningitis/Viral Encephalitis  - on vancomycin and rocephin for bacterial meningitis  - Has completed course of acyclovir  for possible viral meningitis  Active Problems:  Diabetic ketoacidosis  - Poorly controlled diabetes with renal manifestations  - A1c 03/24/2013 9.9 indicating poor glycemic control  - Diabetic ketoacidosis now resolved; continue sliding scale insulin in addition to Lantus 30 units at bedtime  - CBG's in past 24 hours: 261, 185, 173 Hypokalemia - secondary to lasix - repleted this am with potassium chloride 40 meq once PO - patient is on 80 mg lasix daily so will need to be on daily potassium supplementation at the time of discharge  Morbid obesity  - Nutrition consulted  HYPERTENSION  - Blood pressure 158/71 - Continue spironolactone 100 mg twice daily and Lasix 80 mg daily by mouth  - add hydralazine IV 5 mg PRN for better BP control every 8 hours Dementia  - Stable, patient is back to be mental status where she is oriented to time, place and person  CKD (chronic kidney disease), stage III  - Patient's baseline creatinine appears to be 1.7 and creatinine on this admission 1.7  - Creatinine stable at 1.39 GERD  - Continue Protonix 40 mg daily, transition to PO  Code Status: full code  Family Communication: no family at the bedside  Disposition Plan: SNF once bed available   Manson Passey, MD  [facility]  Pager [phone]   Consultants:  Initially under PCCM, transferred to [facility] 04/02/2013 Procedures:  None  Antibiotics:  Rocephin 8/19>>>  Vanco 8/19>>>  Acyclovir 8/19>>> 8/19    If 7PM-7AM, please contact night-coverage [email or web address] Password [facility] 04/04/2013, 7:52 AM   LOS: 5 days    HPI/Subjective: Sitting in bed, feels comfortable.  Objective: Filed Vitals:   04/03/13 1411 04/03/13 2059 04/04/13 0500 04/04/13 0619  BP: 150/72 117/44  158/71  Pulse: 66 64  77  Temp: 98.2 F (36.8 C) 98.2 F (36.8 C)  98.3 F (36.8 C)  TempSrc: Oral Oral  Oral  Resp: 16 16  16   Height:      Weight:   79.946 kg (176 lb 4 oz)   SpO2: 100% 97%  97%    Intake/Output Summary (Last  24 hours) at 04/04/13 0752 Last data filed at 04/03/13 1339  Gross per 24 hour  Intake    400 ml  Output      0 ml  Net    400 ml    Exam:   General:  Pt is alert, follows commands appropriately, not in acute distress  Cardiovascular: Regular rate and rhythm, S1/S2, no murmurs, no rubs, no gallops  Respiratory: Clear to auscultation bilaterally, no wheezing, no crackles, no rhonchi  Abdomen: Soft, non tender, non distended, bowel sounds present, no guarding  Extremities: non pitting +1 edema, pulses DP and PT palpable bilaterally  Neuro: Grossly nonfocal  Data Reviewed: Basic Metabolic Panel:  Recent Labs Lab 03/31/13 0020 03/31/13 0430 04/01/13 0445 04/02/13 0545 04/03/13 0700  NA 134* 135 137 136 134*  K 3.9 3.8 3.6 3.7 3.2*  CL 98 99 103 101 98  CO2 24 25 25 25 27   GLUCOSE 149* 139* 129* 148* 120*  BUN 32* 32* 33* 39* 38*  CREATININE 1.49* 1.46* 1.29* 1.35* 1.39*  CALCIUM 9.4 9.6 9.0 9.0 8.8  MG  --  1.5  --  1.7  --   PHOS  --   --   --  3.2  --    Liver Function Tests:  Recent Labs Lab 03/30/13 1305  AST 20  ALT 13  ALKPHOS 113  BILITOT 0.7  PROT 9.2*  ALBUMIN 4.3   No results found for this basename: LIPASE, AMYLASE,  in the last 168 hours  Recent Labs Lab 03/30/13 1450  AMMONIA 20   CBC:  Recent Labs Lab 03/30/13 1305 03/30/13 1728 03/31/13 0430 04/01/13 0445 04/02/13 0545 04/03/13 0700  WBC 10.2 12.1* 17.9* 23.1* 20.4* 11.6*  NEUTROABS 9.6*  --   --   --   --   --   HGB 15.6* 15.8* 14.5 13.8 14.0 14.1  HCT 48.1* 48.1* 44.3 41.9 42.6 43.1  MCV 72.9* 72.5* 71.9* 72.4* 72.0* 71.7*  PLT 144* 139* 166 163 151 152   Cardiac Enzymes:  Recent Labs Lab 03/30/13 1305  TROPONINI <0.30   BNP: No components found with this basename: POCBNP,  CBG:  Recent Labs Lab 04/03/13 0733 04/03/13 1213 04/03/13 1649 04/03/13 2159 04/04/13 0725  GLUCAP 116* 152* 261* 185* 173*    URINE CULTURE     Status: None   Collection Time     03/30/13 12:16 PM      Result Value Range Status   Specimen Description URINE, CATHETERIZED   Final   Special Requests NONE   Final   Culture  Setup Time     Final   Value: 03/30/2013 16:05     Performed at [facility] Count     Final   Value: NO GROWTH     Performed at [facility]   Culture     Final   Value: NO GROWTH     Performed at [facility]   Report Status 03/31/2013 FINAL   Final  CULTURE, BLOOD (ROUTINE X 2)     Status: None   Collection Time    03/30/13  12:50 PM      Result Value Range Status   Specimen Description BLOOD RIGHT HAND   Final   Special Requests BOTTLES DRAWN AEROBIC AND ANAEROBIC   Final   Culture  Setup Time     Final   Value: 03/30/2013 17:22     Performed at [facility]   Culture     Final   Value:        BLOOD CULTURE RECEIVED NO GROWTH TO DATE CULTURE WILL BE HELD FOR 5 DAYS BEFORE ISSUING A FINAL NEGATIVE REPORT     Performed at [facility]   Report Status PENDING   Incomplete  CULTURE, BLOOD (ROUTINE X 2)     Status: None   Collection Time    03/30/13  1:05 PM      Result Value Range Status   Specimen Description BLOOD RIGHT FOREARM   Final   Special Requests BOTTLES DRAWN AEROBIC ONLY   Final   Culture  Setup Time     Final   Value: 03/30/2013 17:22     Performed at [facility]   Culture     Final   Value:        BLOOD CULTURE RECEIVED NO GROWTH TO DATE CULTURE WILL BE HELD FOR 5 DAYS BEFORE ISSUING A FINAL NEGATIVE REPORT     Performed at [facility]   Report Status PENDING   Incomplete  MRSA PCR SCREENING     Status: None   Collection Time    03/30/13  4:52 PM      Result Value Range Status   MRSA by PCR NEGATIVE  NEGATIVE Final     Studies: No results found.  Scheduled Meds: . aspirin  81 mg Oral Daily  . cefTRIAXone (ROCEPHIN)  IV  2 g Intravenous Q12H  . dorzolamide  1 drop Both Eyes QHS  . furosemide  80 mg Oral Daily  . insulin aspart  0-20  Units Subcutaneous TID WC  . insulin aspart  0-5 Units Subcutaneous QHS  . insulin glargine  30 Units Subcutaneous QHS  . latanoprost  1 drop Both Eyes QHS  . pantoprazole  40 mg Oral Daily  . potassium chloride  40 mEq Oral Once  . psyllium  1 packet Oral q1800  . spironolactone  100 mg Oral BID WC  . vancomycin  1,000 mg Intravenous Q24H

## 2013-04-05 LAB — CULTURE, BLOOD (ROUTINE X 2): Culture: NO GROWTH

## 2013-04-05 LAB — CBC
HCT: 41.6 % (ref 36.0–46.0)
Hemoglobin: 13.7 g/dL (ref 12.0–15.0)
RBC: 5.72 MIL/uL — ABNORMAL HIGH (ref 3.87–5.11)
RDW: 12.7 % (ref 11.5–15.5)
WBC: 17.2 10*3/uL — ABNORMAL HIGH (ref 4.0–10.5)

## 2013-04-05 LAB — GLUCOSE, CAPILLARY: Glucose-Capillary: 208 mg/dL — ABNORMAL HIGH (ref 70–99)

## 2013-04-05 LAB — BASIC METABOLIC PANEL
BUN: 29 mg/dL — ABNORMAL HIGH (ref 6–23)
Chloride: 95 mEq/L — ABNORMAL LOW (ref 96–112)
GFR calc Af Amer: 37 mL/min — ABNORMAL LOW (ref >90)
Potassium: 3.6 mEq/L (ref 3.5–5.1)

## 2013-04-05 MED ORDER — DOXYCYCLINE HYCLATE 100 MG PO TBEC: 100.0000 mg | DELAYED_RELEASE_TABLET | Freq: Two times a day (BID) | ORAL | Status: DC

## 2013-04-05 MED ORDER — ALPRAZOLAM 0.25 MG PO TABS: 0.2500 mg | ORAL_TABLET | Freq: Four times a day (QID) | ORAL | Status: DC | PRN

## 2013-04-05 MED ORDER — OXYCODONE-ACETAMINOPHEN 5-325 MG/5ML PO SOLN: 5.0000 mL | Freq: Four times a day (QID) | ORAL | Status: DC | PRN

## 2013-04-05 MED ORDER — FUROSEMIDE 80 MG PO TABS: 80.0000 mg | ORAL_TABLET | Freq: Every day | ORAL | Status: DC

## 2013-04-05 MED ORDER — POTASSIUM CHLORIDE ER 10 MEQ PO TBCR: 20.0000 meq | EXTENDED_RELEASE_TABLET | Freq: Two times a day (BID) | ORAL | Status: DC

## 2013-04-05 MED ORDER — SPIRONOLACTONE 100 MG PO TABS: 100.0000 mg | ORAL_TABLET | Freq: Two times a day (BID) | ORAL | Status: DC

## 2013-04-05 MED ORDER — PANTOPRAZOLE SODIUM 40 MG PO TBEC: 40.0000 mg | DELAYED_RELEASE_TABLET | Freq: Two times a day (BID) | ORAL | Status: DC

## 2013-04-05 NOTE — Plan of Care (Signed)
Problem: Discharge Progression Outcomes Goal: CBGs less than or equal to 180 on planned home regimen Outcome: Progressing Discharged to [facility]

## 2013-04-05 NOTE — Progress Notes (Signed)
CSW faxed PT treatment note to [facility] for facility to submit clinicals for authorization process.  CSW faxed clinicals to [facility] and contacted [facility] hospital liaison, Hortencia Pilar to notify.  CSW to facilitate pt discharge needs when [facility] Authorization received.   Jacklynn Lewis, MSW, LCSWA  Clinical Social Work [phone]

## 2013-04-05 NOTE — Progress Notes (Addendum)
Pt discharged to [facility], son will drive her there. He was given the packet of information and prescriptions to bring to [facility] by the Child psychotherapist. Pt was discharge by wheelchair and helped to transfer into the car. Report called to [place] at [facility]

## 2013-04-05 NOTE — Progress Notes (Signed)
Physical Therapy Treatment Patient Details Name: Allison Rodgers MRN: [id number] DOB: [birth year] Today's Date: 04/05/2013 Time: 0935-1000 PT Time Calculation (min): 25 min  PT Assessment / Plan / Recommendation  History of Present Illness gradual improvement   PT Comments   Good participation with PT today with improvement in functional moiblity and strength.  Pt ready to advance to increased intensity of PT at SNF  Follow Up Recommendations  SNF     Does the patient have the potential to tolerate intense rehabilitation     Barriers to Discharge        Equipment Recommendations  None recommended by PT    Recommendations for Other Services OT consult  Frequency Min 3X/week   Progress towards PT Goals Progress towards PT goals: Progressing toward goals  Plan Current plan remains appropriate    Precautions / Restrictions Precautions Precautions: Fall Restrictions Weight Bearing Restrictions: No   Pertinent Vitals/Pain No c/o pain    Mobility  Bed Mobility Bed Mobility: Supine to Sit Supine to Sit: 4: Min assist Details for Bed Mobility Assistance: pt needs assist to move legs to the edge of the bed Transfers Transfers: Sit to Stand;Stand to Sit Sit to Stand: 4: Min assist Details for Transfer Assistance: needs consistent cues to push up with arms to stand Ambulation/Gait Ambulation/Gait Assistance: 4: Min assist Ambulation Distance (Feet): 100 Feet (x2) Assistive device: Rolling walker Ambulation/Gait Assistance Details: cues to step up into walker Gait Pattern: Step-through pattern;Decreased step length - right;Decreased step length - left Gait velocity: decreased General Gait Details: pt tends to walk in guarded manner and admits that she is afraid she will lose her balance Stairs: No Wheelchair Mobility Wheelchair Mobility: No    Exercises General Exercises - Lower Extremity Hip ABduction/ADduction: Both;5 reps;Supine;AROM Hip Flexion/Marching: AROM;Both;5  reps;Supine Other Exercises Other Exercises: abd sets, glute sets, bilateral UE hip to hip in supine and standing to activate core   PT Diagnosis:    PT Problem List:   PT Treatment Interventions:     PT Goals (current goals can now be found in the care plan section) Acute Rehab PT Goals Patient Stated Goal: to return to home the way she was before PT Goal Formulation: With patient Time For Goal Achievement: 04/16/13 Potential to Achieve Goals: Good  Visit Information  Last PT Received On: 04/05/13 Assistance Needed: +1 History of Present Illness: gradual improvement    Subjective Data  Patient Stated Goal: to return to home the way she was before   Cognition  Cognition Arousal/Alertness: Awake/alert Behavior During Therapy: WFL for tasks assessed/performed Overall Cognitive Status: Within Functional Limits for tasks assessed    Balance  Balance Balance Assessed: Yes Static Sitting Balance Static Sitting - Balance Support: No upper extremity supported Static Sitting - Level of Assistance: 5: Stand by assistance Static Standing Balance Static Standing - Balance Support: Bilateral upper extremity supported;During functional activity;No upper extremity supported;Right upper extremity supported;Left upper extremity supported Static Standing - Level of Assistance: 5: Stand by assistance Static Standing - Comment/# of Minutes: worked on standing balance with varied arm postitions to challenge balance  End of Session PT - End of Session Equipment Utilized During Treatment: Gait belt Activity Tolerance: Patient tolerated treatment well Patient left: in chair;with family/visitor present Nurse Communication: Mobility status   GP     Donnetta Hail 04/05/2013, 11:01 AM

## 2013-04-05 NOTE — Progress Notes (Signed)
Pt denies any pain. Sitting up in chair, asleep. Possible discharge today.

## 2013-04-05 NOTE — Discharge Summary (Signed)
Physician Discharge Summary  Allison Rodgers ZOX:[id number] DOB: 1923-09-10 DOA: 03/30/2013  PCP: Sandrea Hughs, MD  Admit date: 03/30/2013 Discharge date: 04/05/2013  Recommendations for Outpatient Follow-up:  1. please note that you were hospitalized for bacterial and viral meningitis. You have received a treatment with acyclovir for viral meningitis. He received a treatment with vancomycin and Rocephin for bacterial meningitis but you should also continue doxycycline 100 mg twice daily for additional 7 days to complete the treatment course.  2. we have made some adjustments to the medications compared to the home medications. Instead of Nexium and Pepcid we decided to prescribe Protonix 40 mg twice daily as you tolerated this medication better during this hospital stay  3. in regards to diabetes, you may continue Lantus 30 units at bedtime in addition to Januvia 50 mg daily and Amaryl 2 mg daily. Continue to monitor CBGs per nursing home protocol and make necessary adjustments to Lantus first prior to making adjustments to oral anti-hyperglycemics.  4. continue taking Lasix 80 mg daily as previously prescribed. Lasix can cause potassium stores to be depleted therefore we recommended taking potassium supplementation 20 mEq twice daily. Please check potassium level regularly at least every couple of days to make sure it is within normal limits. Potassium prior to discharge is within normal limits.  5. Continue taking spironolactone 100 mg twice daily  6. your kidney function is stable, creatinine 1.39 prior to discharge. Please continue to monitor renal function per nursing home protocol.  7. your white blood cell count is 11.6 at the time of the discharge. Please recheck CBC in about one week from this discharge to make sure it is stable.  8. hemoglobin remained within normal limits throughout the hospital stay.   Discharge Diagnoses:  Principal Problem:   Acute encephalopathy Active  Problems:   Meningitis   DM   Morbid obesity   HYPERTENSION   GERD   DKA (diabetic ketoacidoses)   Dementia   CKD (chronic kidney disease), stage III    Discharge Condition: medically stable for discharge today to SNF; pt son agrees with the discharge plan  Diet recommendation: as toelrated  History of present illness:  79 -year-old female with past medical history of diabetes, morbid obesity, esophageal strictures hypertension, chronic kidney disease who presented to [facility] ED 03/30/2013 with fever of 102 F and altered mental status. Patient was found to be in DKA. In addition patient had elevated lactic acid, creatinine was 1.7 which is around patient's baseline. Patient was started on empiric antibiotics, Rocephin and vancomycin for possible bacterial meningitis. In addition patient was given acyclovir for possible viral meningitis. LP was attempted but unsuccessful due to the scar at the site of lumbar puncture. CT head revealed moderate atrophy but no acute intracranial abnormalities. Chest x-ray showed mild left lower lobe atelectasis. Urinalysis was unremarkable. Patient has been under critical care medicine care and has [facility]2013. Over past 24 hours he was noted by nursing staff that patient had potential is stool in the vagina. CT abdomen, pelvis did not reveal definite fistula. No further stool in vagina reported.  Assessment/Plan:   Principal Problem:  Acute encephalopathy  - likely secondary to viral and/or bacterial meningitis, encephalitis, delirium, advancing dementia  - please note that LP was attempted but unsuccesful due to scarring at the LP site  - CXR showed mild left lower lobe atelectasis but no acute cardiopulmonary process  - urinalysis unremarkable  - remains afebrile on current  antibiotic regimen with vanco and rocephin; will transition to doxycycline at the time of discharge, 100 mg twice daily for 7 days  Bacteria  Meningitis/Viral Encephalitis  - on vancomycin and rocephin for bacterial meningitis which will be changed to doxycycline 100 mg twice daily for 7 days on discharge - Has completed course of acyclovir for possible viral meningitis   Active Problems:  Diabetic ketoacidosis  - Poorly controlled diabetes with renal manifestations  - A1c 03/24/2013 9.9 indicating poor glycemic control  - Diabetic ketoacidosis now resolved; continue sliding scale insulin in addition to Lantus 30 units at bedtime  - At the time of discharge, patient can continue Amaryl and Januvia per home regimen Hypokalemia  - secondary to lasix  - Potassium is within normal limits this morning but patient doesn't need supplementation 2 to high Lasix dosage. We will prescribe potassium chloride 20 mEq twice a day. Continue to monitor potassium level per nursing home protocol Morbid obesity  - Nutrition consulted  HYPERTENSION  - Continue spironolactone 100 mg twice daily and Lasix 80 mg daily by mouth  - add hydralazine IV 5 mg PRN for better BP control every 8 hours while in hospital; blood pressure reasonably well controlled, 141/78 prior to discharge Dementia  - Stable, patient is back to be mental status where she is oriented to time, place and person  CKD (chronic kidney disease), stage III  - Patient's baseline creatinine appears to be 1.7 and creatinine on this admission 1.7  - Creatinine stable at 1.39  GERD  - Continue Protonix 40 mg twice daily  Code Status: full code  Family Communication: no family at the bedside   Manson Passey, MD  [facility]  Pager [phone]   Consultants:  Initially under PCCM, transferred to [facility] 04/02/2013 Procedures:  None  Antibiotics:  Doxycycline 100 mg twice daily for 7 days on discharge Rocephin 8/19>>> 8/25 Vanco 8/19>>> 8/25 Acyclovir 8/19>>> 8/19     Discharge Exam: Filed Vitals:   04/05/13 0645  BP: 141/78  Pulse: 92  Temp: 98.5 F (36.9 C)  Resp: 16   Filed Vitals:    04/04/13 0619 04/04/13 1338 04/04/13 2202 04/05/13 0645  BP: 158/71 143/66 141/89 141/78  Pulse: 77 83 87 92  Temp: 98.3 F (36.8 C) 98.3 F (36.8 C) 98.9 F (37.2 C) 98.5 F (36.9 C)  TempSrc: Oral Oral Oral Oral  Resp: 16 16 16 16   Height:      Weight:    79.635 kg (175 lb 9 oz)  SpO2: 97% 97% 98% 100%    General: Pt is alert, follows commands appropriately, not in acute distress Cardiovascular: Regular rate and rhythm, S1/S2 appreciated Respiratory: Clear to auscultation bilaterally, no wheezing, no crackles, no rhonchi Abdominal: Soft, non tender, non distended, bowel sounds +, no guarding Extremities: trace non pitting edema, no cyanosis, pulses palpable bilaterally DP and PT Neuro: Grossly nonfocal  Discharge Instructions  Discharge Orders   Future Appointments Provider Department Dept Phone   04/16/2013 1:15 PM Reather Littler, MD [facility] PRIMARY [facility] ENDOCRINOLOGY [phone]   05/05/2013 10:30 AM Babette Relic Rogers Seeds, NP [facility] [phone]   09/27/2013 2:30 PM Suanne Marker, MD [facility] [phone]   Future Orders Complete By Expires   Call MD for:  difficulty breathing, headache or visual disturbances  As directed    Call MD for:  persistant dizziness or light-headedness  As directed    Call MD for:  persistant nausea and vomiting  As directed  Call MD for:  severe uncontrolled pain  As directed    Diet - low sodium heart healthy  As directed    Discharge instructions  As directed    Comments:     1. please note that you were hospitalized for bacterial and viral meningitis. You have received a treatment with acyclovir for viral meningitis. He received a treatment with vancomycin and Rocephin for bacterial meningitis but you should also continue doxycycline 100 mg twice daily for additional 7 days to complete the treatment course. 2. we have made some adjustments to the medications compared to the home medications. Instead of Nexium  and Pepcid we decided to prescribe Protonix 40 mg twice daily as you tolerated this medication better during this hospital stay 3. in regards to diabetes, you may continue Lantus 30 units at bedtime in addition to Januvia 50 mg daily and Amaryl 2 mg daily. Continue to monitor CBGs per nursing home protocol and make necessary adjustments to Lantus first prior to making adjustments to oral anti-hyperglycemics. 4. continue taking Lasix 80 mg daily as previously prescribed. Lasix can cause potassium stores to be depleted therefore we recommended taking potassium supplementation 20 mEq twice daily. Please check potassium level regularly at least every couple of days to make sure it is within normal limits. Potassium prior to discharge is within normal limits. 5. Continue taking spironolactone 100 mg twice daily 6. your kidney function is stable, creatinine 1.39 prior to discharge. Please continue to monitor renal function per nursing home protocol. 7. your white blood cell count is 11.6 at the time of the discharge. Please recheck CBC in about one week from this discharge to make sure it is stable. 8. hemoglobin remained within normal limits throughout the hospital stay.   Increase activity slowly  As directed        Medication List    STOP taking these medications       CITRUCEL oral powder  Generic drug:  methylcellulose     esomeprazole 40 MG capsule  Commonly known as:  NEXIUM     famotidine 20 MG tablet  Commonly known as:  PEPCID      TAKE these medications       acetaminophen 500 MG tablet  Commonly known as:  TYLENOL  500-1,000 mg every 6 (six) hours as needed for pain. Per bottle as needed for pain     ALPRAZolam 0.25 MG tablet  Commonly known as:  XANAX  Take 1 tablet (0.25 mg total) by mouth every 6 (six) hours as needed for anxiety.     aspirin 81 MG chewable tablet  Chew 81 mg by mouth daily.     bimatoprost 0.03 % ophthalmic solution  Commonly known as:  LUMIGAN   Place 1 drop into both eyes at bedtime.     CALCIUM PLUS VITAMIN D PO  Take 1 tablet by mouth daily.     chlorpheniramine 4 MG tablet  Commonly known as:  CHLOR-TRIMETON  Take 4 mg by mouth 2 (two) times daily as needed (for nasal drip, drainage).     dorzolamide 2 % ophthalmic solution  Commonly known as:  TRUSOPT  Place 1 drop into both eyes at bedtime.     doxycycline 100 MG EC tablet  Commonly known as:  DORYX  Take 1 tablet (100 mg total) by mouth 2 (two) times daily.     fluocinonide cream 0.05 %  Commonly known as:  LIDEX  Apply 1 application topically 2 (two) times daily  as needed (rash).     furosemide 80 MG tablet  Commonly known as:  LASIX  Take 1 tablet (80 mg total) by mouth daily.     glimepiride 2 MG tablet  Commonly known as:  AMARYL  Take 2 mg by mouth daily with breakfast.     insulin glargine 100 UNIT/ML injection  Commonly known as:  LANTUS  Inject 30 Units into the skin at bedtime.     oxyCODONE-acetaminophen 5-325 MG/5ML solution  Commonly known as:  ROXICET  Take 5 mLs by mouth every 6 (six) hours as needed.     pantoprazole 40 MG tablet  Commonly known as:  PROTONIX  Take 1 tablet (40 mg total) by mouth 2 (two) times daily.     potassium chloride 10 MEQ tablet  Commonly known as:  K-DUR  Take 2 tablets (20 mEq total) by mouth 2 (two) times daily.     sitaGLIPtin 50 MG tablet  Commonly known as:  JANUVIA  Take 50 mg by mouth daily.     sodium chloride 0.65 % nasal spray  Commonly known as:  OCEAN  Place 2 sprays into the nose as needed for congestion.     spironolactone 100 MG tablet  Commonly known as:  ALDACTONE  Take 1 tablet (100 mg total) by mouth 2 (two) times daily.     TUSSIN COUGH 15 MG/5ML syrup  Generic drug:  dextromethorphan  1-2 tsp every 4 hours as needed for cough           Follow-up Information   Follow up with Sandrea Hughs, MD In 2 weeks.   Specialty:  Pulmonary Disease   Contact information:   [address] [place] [address] [phone]        The results of significant diagnostics from this hospitalization (including imaging, microbiology, ancillary and laboratory) are listed below for reference.    Significant Diagnostic Studies: Ct Abdomen Pelvis Wo Contrast 04/01/2013   *RADIOLOGY REPORT*  Clinical Data: Possible rectovaginal fistula.  Previous repair.  CT ABDOMEN AND PELVIS WITHOUT CONTRAST  Technique:  Multidetector CT imaging of the abdomen and pelvis was performed following the standard protocol without intravenous contrast.  Comparison: 01/01/2013  Findings: Visualized lung bases clear.  Surgical clips in the gallbladder fossa.  Patchy aortoiliac arterial plaque. Unremarkable uninfused evaluation of liver, spleen, adrenal glands, kidneys.  Pancreatic parenchymal atrophy without focal lesion. Stomach, small bowel, and colon are nondilated.  There are innumerable diverticula from the descending and sigmoid portions of the colon. Previous hysterectomy.  The mid sigmoid colon is immediately adjacent to the vaginal cuff, without intervening fat plane.  There is gas in the vagina. No definite fistula is identified.  The urinary bladder is physiologically distended.  Bilateral pelvic phleboliths.  No ascites.  No free air.  No adenopathy. Spondylitic changes in the visualized lower thoracic and lumbar spine with grade 1 anterolisthesis L4-5 related to facet disease.  IMPRESSION: 1.  Gas in the vagina, immediately adjacent to the mid sigmoid colon, without demonstration of   definite fistula. Rectal contrast administration may be useful to more definitively exclude persistent   colovaginal fistula. 2.  Descending and sigmoid diverticulosis.   Original Report Authenticated By: D. Andria Rhein, MD   Dg Chest 2 View 03/30/2013   *RADIOLOGY REPORT*  Clinical Data: Altered mental status with weakness.  CHEST - 2 VIEW  Comparison: 08/28/2010.  Findings: Trachea is midline.  Heart size stable.  Thoracic  aorta is calcified.  Minimal bibasilar atelectasis or scarring.  No pleural fluid. Left hemidiaphragm is mildly elevated, as before.  IMPRESSION: Minimal bibasilar atelectasis or scarring.   Original Report Authenticated By: Leanna Battles, M.D.   Ct Head Wo Contrast 03/30/2013   *RADIOLOGY REPORT*  Clinical Data: Altered mental status  CT HEAD WITHOUT CONTRAST  Technique:  Contiguous axial images were obtained from the base of the skull through the vertex without contrast.  Comparison: MRI 11/14/2012  Findings: Moderately severe generalized atrophy, unchanged.  No acute infarct.  Negative for hemorrhage or mass.  No midline shift. No skull lesion identified.  IMPRESSION: Moderate atrophy.  No acute abnormality.   Original Report Authenticated By: Janeece Riggers, M.D.   Mr [facility] Wo Contrast 04/01/2013   *RADIOLOGY REPORT*  Clinical Data:  Altered mental status. Acute encephalopathy with fever.  Diabetic ketoacidosis.  MRI HEAD WITHOUT CONTRAST MRA HEAD WITHOUT CONTRAST  Technique:  Multiplanar, multiecho pulse sequences of the brain and surrounding structures were obtained without intravenous contrast. Angiographic images of the head were obtained using MRA technique without contrast.  Comparison:  03/30/2013.  MRI HEAD  Findings:  There is no evidence for acute infarction, intracranial hemorrhage, mass lesion, hydrocephalus, or extra-axial fluid.  The patient had difficulty remaining motionless for the study.  Images are suboptimal.  Small or subtle lesions could be overlooked.  There is no evidence for acute infarction, intracranial hemorrhage, mass lesion, hydrocephalus, or extra-axial fluid.  Moderate to severe atrophy is present.  Mild chronic microvascular ischemic change is present in the periventricular and subcortical white matter.  Flow voids are  maintained in the carotid and basilar arteries. There is no obvious pituitary or cerebellar abnormality. No large vessel infarct.  No osseous lesions.  No  acute sinus or mastoid disease.  On midline sagittal T1-weighted images, there is mild dural thickening ventrally and dorsally around the foramen magnum (arrows).  It is not clear that this is acute  but in the setting of encephalopathy and fever, meningitis cannot completely be excluded.  No definite CSF abnormality can be seen on motion degraded FLAIR images.  If the patient is not improving, consider lumbar puncture for further evaluation.  Compared to prior CT, the appearance is similar.  IMPRESSION: Motion degraded exam demonstrating no acute stroke or hemorrhage.  Mild dural thickening around the foramen magnum of uncertain significance.  In the setting of fever and encephalopathy, meningitis cannot completely be excluded.  If there are no contraindications, lumbar puncture may be helpful in further evaluation.  MRA HEAD  Findings: Motion degraded exam.  Gross patency of the internal carotid arteries is established.  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  There may be superimposed basilar stenosis in its mid and distal portions as the vessel becomes increasingly attenuated more cephalad.  Right vertebral is the dominant/sole contributor to the basilar although the left vertebral is patent at the skull base level.  There is no obvious proximal occlusion of the middle or posterior cerebral arteries.  There is poor flow related enhancement in the proximal left anterior cerebral artery which could represent intracranial atherosclerotic change.  IMPRESSION: Suspected mid to distal basilar stenosis on this motion degraded exam.  Gross patency of internal carotid arteries is established.   Original Report Authenticated By: Davonna Belling, M.D.   Mr Brain Wo Contrast 04/01/2013   *RADIOLOGY REPORT*  Clinical Data:  Altered mental status. Acute encephalopathy with fever.  Diabetic ketoacidosis.  MRI HEAD WITHOUT CONTRAST  MRA HEAD WITHOUT CONTRAST  Technique:  Multiplanar,  multiecho pulse sequences of the brain and surrounding structures were obtained without intravenous contrast. Angiographic images of the head were obtained using MRA technique without contrast.  Comparison:  03/30/2013.  MRI HEAD  Findings:  There is no evidence for acute infarction, intracranial hemorrhage, mass lesion, hydrocephalus, or extra-axial fluid.  The patient had difficulty remaining motionless for the study.  Images are suboptimal.  Small or subtle lesions could be overlooked.  There is no evidence for acute infarction, intracranial hemorrhage, mass lesion, hydrocephalus, or extra-axial fluid.  Moderate to severe atrophy is present.  Mild chronic microvascular ischemic change is present in the periventricular and subcortical white matter.  Flow voids are  maintained in the carotid and basilar arteries. There is no obvious pituitary or cerebellar abnormality. No large vessel infarct.  No osseous lesions.  No acute sinus or mastoid disease.  On midline sagittal T1-weighted images, there is mild dural thickening ventrally and dorsally around the foramen magnum (arrows).  It is not clear that this is acute  but in the setting of encephalopathy and fever, meningitis cannot completely be excluded.  No definite CSF abnormality can be seen on motion degraded FLAIR images.  If the patient is not improving, consider lumbar puncture for further evaluation.  Compared to prior CT, the appearance is similar.  IMPRESSION: Motion degraded exam demonstrating no acute stroke or hemorrhage.  Mild dural thickening around the foramen magnum of uncertain significance.  In the setting of fever and encephalopathy, meningitis cannot completely be excluded.  If there are no contraindications, lumbar puncture may be helpful in further evaluation.  MRA HEAD  Findings: Motion degraded exam.  Gross patency of the internal carotid arteries is established.  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   There may be superimposed basilar stenosis in its mid and distal portions as the vessel becomes increasingly attenuated more cephalad.  Right vertebral is the dominant/sole contributor to the basilar although the left vertebral is patent at the skull base level.  There is no obvious proximal occlusion of the middle or posterior cerebral arteries.  There is poor flow related enhancement in the proximal left anterior cerebral artery which could represent intracranial atherosclerotic change.  IMPRESSION: Suspected mid to distal basilar stenosis on this motion degraded exam.  Gross patency of internal carotid arteries is established.   Original Report Authenticated By: Davonna Belling, M.D.   Dg Chest Port 1 View 04/01/2013   *RADIOLOGY REPORT*  Clinical Data: Atelectasis  PORTABLE CHEST - 1 VIEW  Comparison: 03/31/2013  Findings: Mild left lower lobe atelectasis is unchanged.  Right lung remains clear.  Negative for heart failure or effusion.  Left subclavian central venous catheter tip in the SVC at the cavoatrial junction, unchanged.  No pneumothorax.  IMPRESSION: Mild left lower lobe atelectasis, unchanged.  No significant change from yesterday.   Original Report Authenticated By: Janeece Riggers, M.D.   Dg Chest Port 1 View 03/31/2013   *RADIOLOGY REPORT*  Clinical Data: Evaluate airspace disease, atelectasis.  PORTABLE CHEST - 1 VIEW  Comparison: 03/30/2013  Findings: Left central line remains in place, unchanged.  Left base atelectasis with slight elevation of the left hemidiaphragm, stable.  Right lung is clear.  No visible effusions.  No acute bony abnormality.  IMPRESSION: Minimal left base atelectasis.   Original Report Authenticated By: Charlett Nose, M.D.   Dg Chest Port 1 View 03/30/2013   *RADIOLOGY REPORT*  Clinical  Data: Status post central line placement  PORTABLE CHEST - 1 VIEW  Comparison: 03/30/2013 1236 hours  Findings: There is been interval placement of a left-sided subclavian central line.   Catheter tip is noted in the upper right atrium.  No pneumothorax is noted.  The overall inspiratory effort is poor with crowding of the vascular markings.  Mild left basilar atelectasis is seen.  Cardiac shadow is stable.  IMPRESSION: Status post left central line placement without evidence of pneumothorax.   Original Report Authenticated By: Alcide Clever, M.D.   Dg Abd Portable 1v 04/04/2013   *RADIOLOGY REPORT*  Clinical Data: Abdominal distention, vomiting  PORTABLE ABDOMEN - 1 VIEW  Comparison: CT abdomen and pelvis 04/01/2013  Findings: Retained contrast in the colon. Scattered colonic diverticula from splenic flexure through sigmoid colon. Nonobstructive bowel gas pattern. No bowel dilatation or bowel wall thickening. Bones demineralized. Pelvic phleboliths noted. Surgical clips right upper quadrant question cholecystectomy.  IMPRESSION: Retained contrast in distal colon without evidence of bowel obstruction. Distal colonic diverticulosis.   Original Report Authenticated By: Ulyses Southward, M.D.    Microbiology: URINE CULTURE     Status: None   Collection Time    03/30/13 12:16 PM      Result Value Range Status   Specimen Description URINE, CATHETERIZED   Final   Special Requests NONE   Final   Culture  Setup Time     Final   Value: 03/30/2013 16:05     Performed at [facility]   Colony Count     Final   Value: NO GROWTH     Performed at [facility]   Culture     Final   Value: NO GROWTH     Performed at [facility]   Report Status 03/31/2013 FINAL   Final  CULTURE, BLOOD (ROUTINE X 2)     Status: None   Collection Time    03/30/13 12:50 PM      Result Value Range Status   Specimen Description BLOOD RIGHT HAND   Final   Special Requests BOTTLES DRAWN AEROBIC AND ANAEROBIC   Final   Culture  Setup Time     Final   Value: 03/30/2013 17:22     Performed at [facility]   Culture     Final   Value: NO GROWTH 5 DAYS     Performed at [facility]   Report Status 04/05/2013 FINAL   Final  CULTURE, BLOOD (ROUTINE X 2)     Status: None   Collection Time    03/30/13  1:05 PM      Result Value Range Status   Specimen Description BLOOD RIGHT FOREARM   Final   Special Requests BOTTLES DRAWN AEROBIC ONLY   Final   Culture  Setup Time     Final   Value: 03/30/2013 17:22     Performed at [facility]   Culture     Final   Value: NO GROWTH 5 DAYS     Performed at [facility]   Report Status 04/05/2013 FINAL   Final  MRSA PCR SCREENING     Status: None   Collection Time    03/30/13  4:52 PM      Result Value Range Status   MRSA by PCR NEGATIVE  NEGATIVE Final     Labs: Basic Metabolic Panel:  Recent Labs Lab 03/31/13 0020 03/31/13 0430 04/01/13 0445 04/02/13 0545 04/03/13 0700 04/05/13 0630  NA 134* 135 137 136 134* 132*  K 3.9 3.8 3.6 3.7 3.2* 3.6  CL 98 99 103 101 98 95*  CO2 24 25 25 25 27 30   GLUCOSE 149* 139* 129* 148* 120* 102*  BUN 32* 32* 33* 39* 38* 29*  CREATININE 1.49* 1.46* 1.29* 1.35* 1.39* 1.43*  CALCIUM 9.4 9.6 9.0 9.0 8.8 9.0  MG  --  1.5  --  1.7  --   --   PHOS  --   --   --  3.2  --   --    Liver Function Tests:  Recent Labs Lab 03/30/13 1305  AST 20  ALT 13  ALKPHOS 113  BILITOT 0.7  PROT 9.2*  ALBUMIN 4.3   No results found for this basename: LIPASE, AMYLASE,  in the last 168 hours  Recent Labs Lab 03/30/13 1450  AMMONIA 20   CBC:  Recent Labs Lab 03/30/13 1305  03/31/13 0430 04/01/13 0445 04/02/13 0545 04/03/13 0700 04/05/13 0630  WBC 10.2  < > 17.9* 23.1* 20.4* 11.6* 17.2*  NEUTROABS 9.6*  --   --   --   --   --   --   HGB 15.6*  < > 14.5 13.8 14.0 14.1 13.7  HCT 48.1*  < > 44.3 41.9 42.6 43.1 41.6  MCV 72.9*  < > 71.9* 72.4* 72.0* 71.7* 72.7*  PLT 144*  < > 166 163 151 152 167  < > = values in this interval not displayed. Cardiac Enzymes:  Recent Labs Lab 03/30/13 1305  TROPONINI <0.30   BNP: BNP (last 3 results) No results found  for this basename: PROBNP,  in the last 8760 hours CBG:  Recent Labs Lab 04/04/13 1209 04/04/13 1705 04/04/13 2219 04/05/13 0723 04/05/13 1150  GLUCAP 228* 257* 208* 120* 238*    Time coordinating discharge: Over 30 minutes  Signed:  Manson Passey, MD  [facility]  04/05/2013, 12:01 PM  Pager #: [phone]

## 2013-04-05 NOTE — Care Management Note (Signed)
   CARE MANAGEMENT NOTE 04/05/2013  Patient:  Allison Rodgers, Allison Rodgers   Account Number:  [id number]  Date Initiated:  04/05/2013  Documentation initiated by:  Nary Sneed  Subjective/Objective Assessment:   79 yo female admitted with meningitis.     Action/Plan:   SNF   Anticipated DC Date:  04/05/2013   Anticipated DC Plan:  SKILLED NURSING FACILITY      DC Planning Services  CM consult      Choice offered to / List presented to:  NA   DME arranged  NA      DME agency  NA     HH arranged  NA      HH agency  NA   Status of service:  Completed, signed off Medicare Important Message given?   (If response is "NO", the following Medicare IM given date fields will be blank) Date Medicare IM given:   Date Additional Medicare IM given:    Discharge Disposition:    Per UR Regulation:  Reviewed for med. necessity/level of care/duration of stay  If discussed at Long Length of Stay Meetings, dates discussed:    Comments:  04/05/13 1334 Akim Watkinson,RN,MSN [phone] Chart reviewed. pt's disposition to SNF. Cm to sign off.

## 2013-04-05 NOTE — Plan of Care (Signed)
Problem: Discharge Progression Outcomes Goal: Obtain signed CBG meter Rx form Outcome: Adequate for Discharge Discharge to [facility]

## 2013-04-05 NOTE — Progress Notes (Signed)
CSW received phone call from pt son this morning stating that pt son was able to tour facilities during the weekend and chooses [facility].  CSW contacted [facility] and confirmed bed availability for today.  CSW contacted Physical Therapist in order for pt to be seen today for [facility] Authorization.  CSW to facilitate pt discharge needs when [facility] Authorization Received.  Jacklynn Lewis, MSW, LCSWA  Clinical Social Work [phone]

## 2013-04-05 NOTE — Progress Notes (Deleted)
CSW continuing to follow for pt disposition planning to [facility].  Per MD, pt not yet medically ready for discharge.  CSW sent [facility] updated clinicals in order for facility to continue to seek insurance authorization through [employer] [place].  CSW contacted [facility] and left voice message for admissions coordinator.  CSW to continue to follow and facilitate pt d/c needs when pt medically stable for d/c and insurance authorization received.  Jacklynn Lewis, MSW, LCSWA  Clinical Social Work [phone]

## 2013-04-05 NOTE — Plan of Care (Signed)
Problem: Discharge Progression Outcomes Goal: Discharge plan in place and appropriate Outcome: Adequate for Discharge Discharge to [facility]

## 2013-04-05 NOTE — Progress Notes (Signed)
CSW received notification from [facility] that insurance authorization through [facility] was received.  CSW met with pt and pt son at bedside to notify.   CSW discussed transportation for pt at d/c to [facility] and pt son plans to transport pt via private vehicle.  CSW facilitated pt discharge needs including notifying [facility] that pt son plans to transport via private vehicle, providing RN phone number to call report, and providing discharge packet to pt son at bedside to provide to [facility] upon arrival.  No further social work needs identified at this time.  CSW signing off.   Jacklynn Lewis, MSW, LCSWA  Clinical Social Work [phone]

## 2013-04-06 ENCOUNTER — Encounter: Payer: Self-pay | Admitting: Adult Health

## 2013-04-06 ENCOUNTER — Non-Acute Institutional Stay (SKILLED_NURSING_FACILITY): Payer: Medicare HMO | Admitting: Adult Health

## 2013-04-06 DIAGNOSIS — E876 Hypokalemia: Secondary | ICD-10-CM

## 2013-04-06 DIAGNOSIS — B3789 Other sites of candidiasis: Secondary | ICD-10-CM | POA: Insufficient documentation

## 2013-04-06 DIAGNOSIS — F0390 Unspecified dementia without behavioral disturbance: Secondary | ICD-10-CM

## 2013-04-06 DIAGNOSIS — E119 Type 2 diabetes mellitus without complications: Secondary | ICD-10-CM

## 2013-04-06 DIAGNOSIS — G039 Meningitis, unspecified: Secondary | ICD-10-CM

## 2013-04-06 DIAGNOSIS — K219 Gastro-esophageal reflux disease without esophagitis: Secondary | ICD-10-CM

## 2013-04-06 DIAGNOSIS — I10 Essential (primary) hypertension: Secondary | ICD-10-CM

## 2013-04-06 LAB — GLUCOSE, CAPILLARY

## 2013-04-06 NOTE — Progress Notes (Signed)
Patient ID: Allison Rodgers, female   DOB: July 24, 1924, 79 y.o.   MRN: [id number]       PROGRESS NOTE  DATE: 04/06/2013  FACILITY:  [facility]  LEVEL OF CARE: SNF (31)  Acute Visit   CHIEF COMPLAINT: Follow-up hospitalization and  HISTORY OF PRESENT ILLNESS: This is an 79 year old female who has been admitted to [facility] on 04/05/13 from [facility] in with principal problems of acute encephalopathy and bacterial meningitis/ viral encephalitis. She has been admitted for a short-term rehabilitation.   PAST MEDICAL HISTORY : Reviewed.  No changes.  CURRENT MEDICATIONS: Reviewed per [facility]  REVIEW OF SYSTEMS:  GENERAL: no change in appetite, no fatigue, no weight changes, no fever, chills or weakness RESPIRATORY: no cough, SOB, DOE,, wheezing, hemoptysis CARDIAC: no chest pain, edema or palpitations GI: no abdominal pain, diarrhea, constipation, heart burn, nausea or vomiting  PHYSICAL EXAMINATION  VS:  T 98        P 77       RR       BP 126/88      POX %       WT 177 (Lb)  GENERAL: no acute distress, normal body habitus EYES: conjunctivae normal, sclerae normal, normal eye lids NECK: supple, trachea midline, no neck masses, no thyroid tenderness, no thyromegaly LYMPHATICS: no LAN in the neck, no supraclavicular LAN RESPIRATORY: breathing is even & unlabored, BS CTAB CARDIAC: RRR, no murmur,no extra heart sounds, no edema GI: abdomen soft, normal BS, no masses, no tenderness, no hepatomegaly, no splenomegaly PSYCHIATRIC: the patient is alert & oriented to person, affect & behavior appropriate  LABS/RADIOLOGY: 04/05/13 sodium 132 potassium 3.6 glucose 102 BUN 29 creatinine 1.43 calcium 9.0 WBC 17.2 hemoglobin 13.7 hematocrit 41.6  ASSESSMENT/PLAN:  Acute encephalopathy - stable  Bacterial meningitis/bilateral encephalitis - continue doxycycline  Diabetes mellitus - continue Amaryl, Januvia and Lantus  Hypertension - well controlled; BP every shift  x1 week and  Candida rash on groin - start applying antifungal with miconazole nitrate 2% to rash areas on groin twice a day x14 days  Dementia - stable  CK D stage III - stable  GERD - stable  Hypokalemia - continue supplementation  CPT CODE: 5638

## 2013-04-07 ENCOUNTER — Non-Acute Institutional Stay (SKILLED_NURSING_FACILITY): Payer: Medicare HMO | Admitting: Internal Medicine

## 2013-04-07 DIAGNOSIS — E1129 Type 2 diabetes mellitus with other diabetic kidney complication: Secondary | ICD-10-CM

## 2013-04-07 DIAGNOSIS — G039 Meningitis, unspecified: Secondary | ICD-10-CM

## 2013-04-07 DIAGNOSIS — I150 Renovascular hypertension: Secondary | ICD-10-CM

## 2013-04-08 ENCOUNTER — Telehealth: Payer: Self-pay | Admitting: Internal Medicine

## 2013-04-08 NOTE — Telephone Encounter (Addendum)
Ok, thanks for the notification  If/ when discharged home and wants to re-establish with me will need to see Tammy NP w/in a week of discharge with all active meds including otcs

## 2013-04-08 NOTE — Telephone Encounter (Signed)
Will forward to MW as an FYI 

## 2013-04-14 ENCOUNTER — Telehealth: Payer: Self-pay | Admitting: Internal Medicine

## 2013-04-14 NOTE — Telephone Encounter (Signed)
Dr [name], I placed the letter in your lookat  Son concerned about pt and would like for you to read the letter and call him  His number is included at the end of the letter Thanks

## 2013-04-14 NOTE — Telephone Encounter (Signed)
Son left letter for MW to look at. Would like a phone call when possible regarding mother.

## 2013-04-14 NOTE — Telephone Encounter (Signed)
Discussed with son > if gets out of snf,   See Tammy NP w/in 1 weeks with all your medications, even over the counter meds, separated in two separate bags, the ones you take no matter what vs the ones you stop once you feel better and take only as needed when you feel you need them.   Tammy  will generate for you a new user friendly medication calendar that will put [place] all on the same page re: your medication use.     Without this process, it simply isn't possible to assure that we are providing  your outpatient care  with  the attention to detail we feel you deserve.   If we cannot assure that you're getting that kind of care,  then we cannot manage your problem effectively from this clinic.  Once you have seen Tammy and we are sure that we're all on the same page with your medication use she will arrange follow up with me.

## 2013-04-15 ENCOUNTER — Encounter: Payer: Self-pay | Admitting: Diagnostic Neuroimaging

## 2013-04-15 ENCOUNTER — Ambulatory Visit (INDEPENDENT_AMBULATORY_CARE_PROVIDER_SITE_OTHER): Payer: Medicare HMO | Admitting: Diagnostic Neuroimaging

## 2013-04-15 ENCOUNTER — Other Ambulatory Visit: Payer: Self-pay | Admitting: *Deleted

## 2013-04-15 VITALS — BP 120/80 | HR 80 | Temp 98.2°F

## 2013-04-15 DIAGNOSIS — G3184 Mild cognitive impairment, so stated: Secondary | ICD-10-CM

## 2013-04-15 DIAGNOSIS — R413 Other amnesia: Secondary | ICD-10-CM

## 2013-04-15 NOTE — Progress Notes (Signed)
[facility]  PATIENT: Allison Rodgers DOB: 1923-12-07  REFERRING CLINICIAN: Wert HISTORY FROM: patient and son REASON FOR VISIT: new consult   HISTORICAL  CHIEF COMPLAINT:  Chief Complaint  Patient presents with  . Follow-up    F/U #6    HISTORY OF PRESENT ILLNESS:   UPDATE 04/15/13: Since last visit, patient had MRI of the brain in April which showed some atrophy but no acute findings. Patient continued to have problems with stomach pain, constipation, diabetes control. In August 2014, patient was found confused at home, and admitted to the hospital with fever, encephalopathy, elevated blood sugar/dka. Patient was stabilized medically and treated empirically for meningitis (although LP was not able to successfully be performed due to scar tissue). Now patient has transition to skilled nursing facility.  Patient continues to have problems with poor appetite, hoarse voice, trouble swallowing, nausea, constipation, bilateral shoulder pain, poor energy, decreased motivation, anxiety and depression.  PRIOR HPI (11/12/12): 79 year old right-handed female with hypertension, diabetes, arthritis, anxiety, here for evaluation of memory problems. Patient accompanied by son today.  Patient reports 2-3 month history of short-term memory loss, forgetting recent conversations, tasks, events. Sometimes she forgets to pay her bills. Sometimes she forgets to lock doors. Patient continues to live alone and takes care of her activities of daily living. She has been more withdrawn and less active socially. Patient having more difficulty with processing written language and documents.  According to the son her memory problems have actually been going on for at least one year. He was out of town but checks in on patient fairly frequently.  Patient also having intermittent episodes of auditory and visual hallucinations. Sometimes she sees someone in the driveway, then looked back again and  does not see anyone there. Sometimes she hears someone knocking on her door but when she checks no one is there. This been going on for the past few months.  Patient was treated with Aricept for a few months, and then discontinued. Patient was restarted on this again a second time but discontinued. Patient did not notice any benefit or side effects of the medication.  REVIEW OF SYSTEMS: Full 14 system review of systems performed and notable only memory loss confusion weakness slurred speech trouble swallowing or sores dizziness and sleepiness anxiety depression not asleep decreased energy change in appetite disinterest in activities aching muscles incontinence cough palpitation fatigue weight loss trouble swallowing.  ALLERGIES: Allergies  Allergen Reactions  . Penicillins Other (See Comments)    REACTION: causes hallucinations  . Sulfamethoxazole W-Trimethoprim Swelling    REACTION: rash  . Bactrim   . Trimethoprim     HOME MEDICATIONS: Outpatient Prescriptions Prior to Visit  Medication Sig Dispense Refill  . acetaminophen (TYLENOL) 500 MG tablet 500-1,000 mg every 6 (six) hours as needed for pain. Per bottle as needed for pain      . ALPRAZolam (XANAX) 0.25 MG tablet Take 1 tablet (0.25 mg total) by mouth every 6 (six) hours as needed for anxiety.  30 tablet  0  . aspirin 81 MG chewable tablet Chew 81 mg by mouth daily.        . bimatoprost (LUMIGAN) 0.03 % ophthalmic solution Place 1 drop into both eyes at bedtime.       . Calcium Carbonate-Vitamin D (CALCIUM PLUS VITAMIN D PO) Take 1 tablet by mouth daily.        . chlorpheniramine (CHLOR-TRIMETON) 4 MG tablet Take 4 mg by mouth 2 (two) times daily as needed (  for nasal drip, drainage).      Marland Kitchen dextromethorphan (TUSSIN COUGH) 15 MG/5ML syrup 1-2 tsp every 4 hours as needed for cough      . dorzolamide (TRUSOPT) 2 % ophthalmic solution Place 1 drop into both eyes at bedtime.      Marland Kitchen doxycycline (DORYX) 100 MG EC tablet Take 1 tablet (100  mg total) by mouth 2 (two) times daily.  14 tablet  0  . fluocinonide cream (LIDEX) 0.05 % Apply 1 application topically 2 (two) times daily as needed (rash).      . furosemide (LASIX) 80 MG tablet Take 1 tablet (80 mg total) by mouth daily.  30 tablet  0  . glimepiride (AMARYL) 2 MG tablet Take 2 mg by mouth daily with breakfast.      . insulin glargine (LANTUS) 100 UNIT/ML injection Inject 30 Units into the skin at bedtime.      Marland Kitchen oxyCODONE-acetaminophen (ROXICET) 5-325 MG/5ML solution Take 5 mLs by mouth every 6 (six) hours as needed.  30 mL  0  . pantoprazole (PROTONIX) 40 MG tablet Take 1 tablet (40 mg total) by mouth 2 (two) times daily.  60 tablet  0  . potassium chloride (K-DUR) 10 MEQ tablet Take 2 tablets (20 mEq total) by mouth 2 (two) times daily.  60 tablet  0  . sitaGLIPtin (JANUVIA) 50 MG tablet Take 50 mg by mouth daily.      . sodium chloride (OCEAN) 0.65 % nasal spray Place 2 sprays into the nose as needed for congestion.      Marland Kitchen spironolactone (ALDACTONE) 100 MG tablet Take 1 tablet (100 mg total) by mouth 2 (two) times daily.  60 tablet  0   No facility-administered medications prior to visit.    PAST MEDICAL HISTORY: Past Medical History  Diagnosis Date  . GERD (gastroesophageal reflux disease)   . Chronic abdominal pain   . DJD (degenerative joint disease)   . Hypertensive cardiovascular disease   . Obesity   . Gastritis, chronic   . 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 Esophageal stricture   . Hiatal hernia   . Hx of adenomatous colonic polyps   . Diverticulosis   . Dyslipidemia   . Arthritis   . DM (diabetes mellitus)   . Hypertension   . Microcytic anemia   . Edema   . Renal disorder     PAST SURGICAL HISTORY: Past Surgical History  Procedure Laterality Date  . Appendectomy    . Cholecystectomy  1999  . Vesicovaginal fistula closure w/ tah  1957  . Bilateral oophorectomy  1957  . Hernia repair  08/2005     umbilical and ventral hernia repair by Dr. Jamey Ripa  . Rotator cuff repair  7/05    right  . Back surgery      FAMILY HISTORY: Family History  Problem Relation Age of Onset  . Stroke Mother   . Cancer Father   . Hypertension Other     SOCIAL HISTORY:  History   Social History  . Marital Status: Widowed    Spouse Name: N/A    Number of Children: 1  . Years of Education: N/A   Occupational History  . Retired    Social History Main Topics  . Smoking status: Never Smoker   . Smokeless tobacco: Never Used  . Alcohol Use: No  . Drug Use: No  . Sexual Activity: Not on file   Other Topics  Concern  . Not on file   Social History Narrative   Pt lives at home alone. 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 She drinks 1 cup of caffeine per month and rarely drinks sodas.     PHYSICAL EXAM  Filed Vitals:   04/15/13 1318  BP: 120/80  Pulse: 80  Temp: 98.2 F (36.8 C)  TempSrc: Oral   Cannot calculate BMI with a height equal to zero.  GENERAL EXAM: Patient is in no distress  CARDIOVASCULAR: Regular rate and rhythm, no murmurs, no carotid bruits  NEUROLOGIC: MENTAL STATUS: awake, alert, language fluent, comprehension intact, naming intact;  POSITIVE SNOUT, MYERSONS. CRANIAL NERVE: pupils equal and reactive to light, visual fields full to confrontation, extraocular muscles intact, no nystagmus, facial sensation and strength symmetric, uvula midline, shoulder shrug symmetric, tongue midline. HOARSE VOICE. MOTOR: normal bulk and tone, DIFFUSE 4/5. LIMITED IN BUE DUE TO SHOULDER PAIN. SENSORY: DECR VIB AT TOES (< 5 SECS) COORDINATION: finger-nose-finger, fine finger movements SLOW THROUGHOUT. REFLEXES: BUE 1, BLE TRACE GAIT/STATION: IN WHEEL CHAIR.   DIAGNOSTIC DATA (LABS, IMAGING, TESTING) - I reviewed patient records, labs, notes, testing and imaging myself where available.  Lab Results  Component Value Date   WBC 17.2* 04/05/2013    HGB 13.7 04/05/2013   HCT 41.6 04/05/2013   MCV 72.7* 04/05/2013   PLT 167 04/05/2013      Component Value Date/Time   NA 132* 04/05/2013 0630   K 3.6 04/05/2013 0630   CL 95* 04/05/2013 0630   CO2 30 04/05/2013 0630   GLUCOSE 102* 04/05/2013 0630   GLUCOSE 97 07/16/2006 1041   BUN 29* 04/05/2013 0630   CREATININE 1.43* 04/05/2013 0630   CALCIUM 9.0 04/05/2013 0630   PROT 9.2* 03/30/2013 1305   ALBUMIN 4.3 03/30/2013 1305   AST 20 03/30/2013 1305   ALT 13 03/30/2013 1305   ALKPHOS 113 03/30/2013 1305   BILITOT 0.7 03/30/2013 1305   GFRNONAA 32* 04/05/2013 0630   GFRAA 37* 04/05/2013 0630   Lab Results  Component Value Date   CHOL 178 11/25/2011   HDL 55.50 11/25/2011   LDLCALC 90 11/25/2011   TRIG 165.0* 11/25/2011   CHOLHDL 3 11/25/2011   Lab Results  Component Value Date   HGBA1C 9.9* 03/24/2013   Lab Results  Component Value Date   VITAMINB12 442 03/18/2012   Lab Results  Component Value Date   TSH 1.04 03/18/2012   No CT or MRI brain studies in PACS.  ASSESSMENT AND PLAN  79 y.o. year old female  has a past medical history of GERD (gastroesophageal reflux disease); Chronic abdominal pain; DJD (degenerative joint disease); Hypertensive cardiovascular disease; Obesity; Gastritis, chronic; Benign neoplasm of other and unspecified site of the digestive system; Esophageal stricture; Hiatal hernia; adenomatous colonic polyps; Diverticulosis; Dyslipidemia; Arthritis; DM (diabetes mellitus); Hypertension; Microcytic anemia; Edema; and Renal disorder. here with progressive short term memory loss (2013-present).  Has intermittent anxiety, auditory and visual hallucinations. MMSE was 26/30 in March 2014. Has frontal release signs (snout, myersons) which raises possibility for an underlying neurodegenerative process. Has some anxiety and depression symptoms as well.  Ddx: neurodegenerative (MCI vs alzheimers), pseudo-dementia of depression/anxiety, metabolic, medication  PLAN: 1. discussed option  of Aricept versus Namenda, but given patient's age, status, co-morbid medical conditions, dysphasia, I'm reluctant to add additional medications at this time 2. followup with PCP for continued evaluation of dysphagia, decreased appetite, decreased mood  No orders of the defined  types were placed in this encounter.    Suanne Marker, MD 04/15/2013, 11:35AM Certified in Neurology, Neurophysiology and Neuroimaging  [facility] [address] [place], [place] [address] [phone]

## 2013-04-15 NOTE — Patient Instructions (Signed)
Continue current medications. 

## 2013-04-16 ENCOUNTER — Other Ambulatory Visit (INDEPENDENT_AMBULATORY_CARE_PROVIDER_SITE_OTHER): Payer: Medicare PPO | Admitting: *Deleted

## 2013-04-16 ENCOUNTER — Ambulatory Visit (INDEPENDENT_AMBULATORY_CARE_PROVIDER_SITE_OTHER): Payer: Medicare PPO | Admitting: Endocrinology

## 2013-04-16 ENCOUNTER — Encounter: Payer: Self-pay | Admitting: Endocrinology

## 2013-04-16 VITALS — BP 116/60 | HR 87 | Temp 98.0°F | Resp 12 | Ht 60.0 in | Wt 165.8 lb

## 2013-04-16 DIAGNOSIS — E1149 Type 2 diabetes mellitus with other diabetic neurological complication: Secondary | ICD-10-CM

## 2013-04-16 DIAGNOSIS — E119 Type 2 diabetes mellitus without complications: Secondary | ICD-10-CM

## 2013-04-16 DIAGNOSIS — R1310 Dysphagia, unspecified: Secondary | ICD-10-CM

## 2013-04-16 LAB — GLUCOSE, POCT (MANUAL RESULT ENTRY): POC Glucose: 202 mg/dl — AB (ref 70–99)

## 2013-04-16 NOTE — Progress Notes (Signed)
Patient ID: Allison Rodgers, female   DOB: [birth year], 79 y.o.   MRN: [id number]    Reason for Appointment : Consultation for Type 2 Diabetes  History of Present llness          Diagnosis: Type 2 diabetes mellitus, date of diagnosis: ? 2007       Past history:  The patient and her son have very little information regarding her diabetes history and the patient is unable to answer all questions appropriately It appears that she had blood sugars over 500 at the time of diagnosis She was started on insulin at the time of diagnosis and has been continuing on this in various regimens Also her weight has been previously significantly more, about 200 pounds  Recent history: The patient is now at the nursing home and no detailed history is available She has had progressive problems with decreased appetite and difficulty swallowing and her overall food intake has been decreased along with significant weight loss Currently is taking only Lantus insulin which has been adjusted, now taking 30 units Also has been given treatment with Amaryl for sometime, more recently Januvia 50 mg has been added without much improvement in control She apparently has never been on mealtime insulin    Glucose monitoring:  done 2 times  a day         Glucometer: ?     Blood Glucose readings from records faxed over for the last 5 days show fasting readings ranging from 157-252, below 200 the last 2 days Before supper 240-364           Hypoglycemia frequency: Never.           Self-care: The diet that the patient has been following WU:[id number] to limit carbohydrates.      Meals: 3 meals per day.  poor appetite    Physical activity: exercise:. none, had been feeling weak for the last month and not able to walk much Dietician recent: yes.                Oral hypoglycemic drugs the patient is taking are: Amaryl and Januvia        Side effects from medications have been: None reported INSULIN regimen is described as: 30   Compliance with the medical regimen:  Retinal exam: Most recent:.2014     Lab Results  Component Value Date   HGBA1C 9.9* 03/24/2013    Filed Weights   04/16/13 1312  Weight: 165 lb 12.8 oz (75.206 kg)      Medication List       This list is accurate as of: 04/16/13 11:59 PM.  Always use your most recent med list.               acetaminophen 500 MG tablet  Commonly known as:  TYLENOL  500-1,000 mg every 6 (six) hours as needed for pain. Per bottle as needed for pain     ALPRAZolam 0.25 MG tablet  Commonly known as:  XANAX  Take 1 tablet (0.25 mg total) by mouth every 6 (six) hours as needed for anxiety.     aspirin 81 MG chewable tablet  Chew 81 mg by mouth daily.     bimatoprost 0.03 % ophthalmic solution  Commonly known as:  LUMIGAN  Place 1 drop into both eyes at bedtime.     CALCIUM PLUS VITAMIN D PO  Take 1 tablet by mouth daily.     chlorpheniramine 4 MG tablet  Commonly known as:  CHLOR-TRIMETON  Take 4 mg by mouth 2 (two) times daily as needed (for nasal drip, drainage).     dorzolamide 2 % ophthalmic solution  Commonly known as:  TRUSOPT  Place 1 drop into both eyes at bedtime.     doxycycline 100 MG EC tablet  Commonly known as:  DORYX  Take 1 tablet (100 mg total) by mouth 2 (two) times daily.     fluocinonide cream 0.05 %  Commonly known as:  LIDEX  Apply 1 application topically 2 (two) times daily as needed (rash).     furosemide 80 MG tablet  Commonly known as:  LASIX  Take 1 tablet (80 mg total) by mouth daily.     glimepiride 2 MG tablet  Commonly known as:  AMARYL  Take 2 mg by mouth daily with breakfast.     insulin glargine 100 UNIT/ML injection  Commonly known as:  LANTUS  Inject 30 Units into the skin at bedtime. Uses flexpen     meloxicam 15 MG tablet  Commonly known as:  MOBIC     oxyCODONE-acetaminophen 5-325 MG/5ML solution  Commonly known as:  ROXICET  Take 5 mLs by mouth every 6 (six) hours as needed.      pantoprazole 40 MG tablet  Commonly known as:  PROTONIX  Take 1 tablet (40 mg total) by mouth 2 (two) times daily.     potassium chloride 10 MEQ tablet  Commonly known as:  K-DUR  Take 2 tablets (20 mEq total) by mouth 2 (two) times daily.     sitaGLIPtin 50 MG tablet  Commonly known as:  JANUVIA  Take 50 mg by mouth daily.     sodium chloride 0.65 % nasal spray  Commonly known as:  OCEAN  Place 2 sprays into the nose as needed for congestion.     spironolactone 100 MG tablet  Commonly known as:  ALDACTONE  Take 1 tablet (100 mg total) by mouth 2 (two) times daily.     TUSSIN COUGH 15 MG/5ML syrup  Generic drug:  dextromethorphan  1-2 tsp every 4 hours as needed for cough        Allergies:  Allergies  Allergen Reactions  . Penicillins Other (See Comments)    REACTION: causes hallucinations  . Sulfamethoxazole W-Trimethoprim Swelling    REACTION: rash  . Bactrim   . Trimethoprim     Past Medical History  Diagnosis Date  . GERD (gastroesophageal reflux disease)   . Chronic abdominal pain   . DJD (degenerative joint disease)   . Hypertensive cardiovascular disease   . Obesity   . Gastritis, chronic   . 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 Esophageal stricture   . Hiatal hernia   . Hx of adenomatous colonic polyps   . Diverticulosis   . Dyslipidemia   . Arthritis   . DM (diabetes mellitus)   . Hypertension   . Microcytic anemia   . Edema   . Renal disorder     Past Surgical History  Procedure Laterality Date  . Appendectomy    . Cholecystectomy  1999  . Vesicovaginal fistula closure w/ tah  1957  . Bilateral oophorectomy  1957  . Hernia repair  08/2005    umbilical and ventral hernia repair by Dr. Jamey Ripa  . Rotator cuff repair  7/05    right  . Back surgery      Family History  Problem Relation Age of Onset  .  Stroke Mother   . Cancer Father   . Hypertension Other     Social History:  reports that she has  never smoked. She has never used smokeless tobacco. She reports that she does not drink alcohol or use illicit drugs.    Review of Systems       Lipids: She is currently not on any medications   Lab Results  Component Value Date   LDLCALC 90 11/25/2011      Skin: No rash or infections     Thyroid:  No  unusual fatigue.     The blood pressure has been relatively better now and she is not on any medications for this      She has had a history of swelling of feet, currently being treated with high dose Aldactone and Lasix along with potassium.     No shortness of breath recently     Bowel habits:  No change. She has difficulty swallowing and is unable to get the food down her throat      No joint  pains.      No  depression          No history of Numbness, tingling or burning in her feet. She thinks her feet are somewhat tender on the bottom on walking  LABS:  Lab Results  Component Value Date   CREATININE 1.43* 04/05/2013     Physical Examination:  BP 116/60  Pulse 87  Temp(Src) 98 F (36.7 C)  Resp 12  Ht 5' (1.524 m)  Wt 165 lb 12.8 oz (75.206 kg)  BMI 32.38 kg/m2  SpO2 96%  GENERAL:         Patient appears to have mild obesity.   HEENT:         Eye exam shows normal external appearance. Fundus exam shows no retinopathy. Oral exam shows moist mucosa .  NECK:         General:  Neck exam shows no lymphadenopathy. Carotids are normal to palpation and no bruit heard. Thyroid is not enlarged and no nodules felt.   LUNGS:         Chest is symmetrical. Lungs are clear to auscultation.Marland Kitchen   HEART:         Heart sounds:  S1 and S2 are normal. No murmurs or clicks heard., no S3 or S4.   ABDOMEN:         General:  There is no distention present. Liver and spleen are not palpable. No other mass present.  she has mild diffuse tenderness in the right side of the abdomen with upper and lower quadrants EXTREMITIES:     There is no edema. No skin lesions present. She has flat  feet.  NEUROLOGICAL:        Vibration sense is markedly reduced in toes. Ankle jerks are absent bilaterally.          Diabetic foot exam:  see appropriate section MUSCULOSKELETAL:       There is no enlargement or deformity of the joints. Spine is normal to inspection.Marland Kitchen   PEDAL pulses: Appear normal SKIN:       No rash or lesions of concern.        ASSESSMENT:  Diabetes type 2, uncontrolled She appears to have significant hyperglycemia recently despite her poor appetite and intake Most likely has insulin resistance although hyperglycemia may be present from mild chronic infection Currently taking 30 units of Lantus insulin along with Amaryl and Januvia. However her blood  sugars are higher during the day and overnight Most likely she is not getting any benefit from Amaryl and Januvia at this time  Complications: Peripheral neuropathy but having mild symptoms only  PLAN:   Split Lantus to twice a day for more efficient control and avoiding hypoglycemia overnight Will give her 18 units in the morning and 14 in the evening She will have her blood sugars faxed from the nursing home next week for further adjustment She will stop taking Amaryl and Januvia, currently not clear if she is able to swallow these anyway Would also like to avoid Amaryl because of her renal dysfunction and potential for hypoglycemia with her poor intake Consider mealtime insulin also when she is eating proper meals   Darris Staiger 04/20/2013, 4:35 PM

## 2013-04-16 NOTE — Patient Instructions (Addendum)
BLOOD sugar testing: Please follow the following schedule  Test blood sugar on waking up and 4 PM ALTERNATING with 12 noon and 9 PM  Fax blood sugar records in one week [phone]  STOP Amaryl/glimepiride and Januvia  LANTUS insulin: Change the dose from tonight to taking 18 units in the morning at 8 AM and 14 units at 8 PM daily

## 2013-04-20 ENCOUNTER — Encounter: Payer: Self-pay | Admitting: Gastroenterology

## 2013-04-20 ENCOUNTER — Ambulatory Visit (INDEPENDENT_AMBULATORY_CARE_PROVIDER_SITE_OTHER): Payer: Medicare PPO | Admitting: Gastroenterology

## 2013-04-20 VITALS — BP 110/60 | HR 81 | Ht 60.0 in | Wt 168.0 lb

## 2013-04-20 DIAGNOSIS — N189 Chronic kidney disease, unspecified: Secondary | ICD-10-CM

## 2013-04-20 DIAGNOSIS — E1149 Type 2 diabetes mellitus with other diabetic neurological complication: Secondary | ICD-10-CM | POA: Insufficient documentation

## 2013-04-20 DIAGNOSIS — G8929 Other chronic pain: Secondary | ICD-10-CM

## 2013-04-20 DIAGNOSIS — K5732 Diverticulitis of large intestine without perforation or abscess without bleeding: Secondary | ICD-10-CM

## 2013-04-20 DIAGNOSIS — R1314 Dysphagia, pharyngoesophageal phase: Secondary | ICD-10-CM

## 2013-04-20 DIAGNOSIS — R1032 Left lower quadrant pain: Secondary | ICD-10-CM

## 2013-04-20 MED ORDER — METRONIDAZOLE 500 MG PO TABS: 500.0000 mg | ORAL_TABLET | Freq: Two times a day (BID) | ORAL | Status: DC

## 2013-04-20 MED ORDER — CIPROFLOXACIN HCL 500 MG PO TABS: 500.0000 mg | ORAL_TABLET | Freq: Two times a day (BID) | ORAL | Status: DC

## 2013-04-20 NOTE — Progress Notes (Signed)
This is a 79 year old African American female recently hospitalized with acute encephalitis in addition to her chronic dementia.  She had extensive in-hospital workup, and the patient also suffers from rather severe chronic kidney disease.  She brought off the day because of abdominal pain which is been present for over 20 years.  Her hospitalization she had CT scan of the abdomen which showed numerous diverticula in the descending sigmoid colon with previous hysterectomy.  There is gas in the vaginal area but no definite fistula.  Her hospitalization she was treated with doxycycline for suspected urinary tract infection.  She continues to complain of lower abdominal pain, transferred dysphagia, but no fever, chills, nausea vomiting, or other GI complaints.  I did endoscopy on this patient may of this year which was unremarkable.  Last colonoscopy was in 2009 showed diverticulosis and adenomatous colon polyps.  Current Medications, Allergies, Past Medical History, Past Surgical History, Family History and Social History were reviewed in [employer] record.  ROS: All systems were reviewed and are negative unless otherwise stated in the HPI.          Physical Exam: Blood pressure 110/60, pulse 81 and regular and weight 168 with BMI of 32.81.  The patient is confused but oriented x3.  Her abdomen is nondistended and there is no organomegaly or masses.  There is some tenderness the left lower quadrant without rebound.  Rectal exam shows no masses or tenderness with normal color stool which is guaiac negative.    Assessment and Plan: Patient with chronic dementia and recent encephalopathy who has end-stage renal disease and multiple medical problems.  She has what sounds like transferred dysphagia from her recent neurovascular problems, and probably will need referral to speech pathology.  I will leave this issue to her primary care physicians at her nursing home.  She seemed to have  perhaps subacute diverticulitis I placed her on Cipro 500 mg twice a day along with metronidazole 500 mg twice a day for the next 10 days with twice a day Benefiber.  She does not appear toxic real, and we will send this note back to her nursing home.  She needs close primary care followup.  She is diabetic and is on oral medications and insulin.  I do not think repeat endoscopy or colonoscopy at benefit at this time.  Followup CT scan may be in order depending on her clinical course.

## 2013-04-20 NOTE — Patient Instructions (Signed)
We have sent the following medications to your pharmacy for you to pick up at your convenience: Cipro 500 mg, please take one tablet by mouth twice daily for ten days Flagyl 500 mg, please take one tablet by mouth twice daily for ten days   Please purchase Benefiber over the counter and take one tablespoon by mouth twice daily

## 2013-04-21 ENCOUNTER — Non-Acute Institutional Stay (SKILLED_NURSING_FACILITY): Payer: Medicare HMO | Admitting: Adult Health

## 2013-04-21 DIAGNOSIS — E871 Hypo-osmolality and hyponatremia: Secondary | ICD-10-CM

## 2013-05-05 ENCOUNTER — Ambulatory Visit (INDEPENDENT_AMBULATORY_CARE_PROVIDER_SITE_OTHER): Payer: Medicare PPO | Admitting: Adult Health

## 2013-05-05 ENCOUNTER — Encounter: Payer: Self-pay | Admitting: Adult Health

## 2013-05-05 VITALS — BP 124/68 | HR 64 | Temp 97.1°F | Ht 60.0 in | Wt 168.6 lb

## 2013-05-05 DIAGNOSIS — F0390 Unspecified dementia without behavioral disturbance: Secondary | ICD-10-CM

## 2013-05-05 DIAGNOSIS — I10 Essential (primary) hypertension: Secondary | ICD-10-CM

## 2013-05-05 DIAGNOSIS — Z23 Encounter for immunization: Secondary | ICD-10-CM

## 2013-05-05 NOTE — Patient Instructions (Addendum)
Continue with same regimen . Follow up with Dr. Lucianne Muss for your Diabetes.  Best Wishes for your new move to Assisted Living.  follow up Dr. [name]  In 3 months - if you do not establish medical care at Assisted Living.

## 2013-05-07 ENCOUNTER — Non-Acute Institutional Stay (SKILLED_NURSING_FACILITY): Payer: Medicare HMO | Admitting: Adult Health

## 2013-05-07 ENCOUNTER — Ambulatory Visit (INDEPENDENT_AMBULATORY_CARE_PROVIDER_SITE_OTHER): Payer: Medicare PPO | Admitting: Endocrinology

## 2013-05-07 ENCOUNTER — Encounter: Payer: Self-pay | Admitting: Endocrinology

## 2013-05-07 VITALS — BP 118/68 | HR 83 | Temp 98.5°F | Resp 10 | Ht 60.0 in | Wt 168.1 lb

## 2013-05-07 DIAGNOSIS — G039 Meningitis, unspecified: Secondary | ICD-10-CM

## 2013-05-07 DIAGNOSIS — H409 Unspecified glaucoma: Secondary | ICD-10-CM

## 2013-05-07 DIAGNOSIS — E876 Hypokalemia: Secondary | ICD-10-CM

## 2013-05-07 DIAGNOSIS — F0390 Unspecified dementia without behavioral disturbance: Secondary | ICD-10-CM

## 2013-05-07 DIAGNOSIS — K219 Gastro-esophageal reflux disease without esophagitis: Secondary | ICD-10-CM

## 2013-05-07 DIAGNOSIS — E871 Hypo-osmolality and hyponatremia: Secondary | ICD-10-CM

## 2013-05-07 DIAGNOSIS — E1129 Type 2 diabetes mellitus with other diabetic kidney complication: Secondary | ICD-10-CM

## 2013-05-07 DIAGNOSIS — I150 Renovascular hypertension: Secondary | ICD-10-CM

## 2013-05-07 DIAGNOSIS — E875 Hyperkalemia: Secondary | ICD-10-CM

## 2013-05-07 LAB — COMPREHENSIVE METABOLIC PANEL
ALT: 29 U/L (ref 0–35)
AST: 29 U/L (ref 0–37)
Albumin: 3.6 g/dL (ref 3.5–5.2)
Alkaline Phosphatase: 64 U/L (ref 39–117)
BUN: 29 mg/dL — ABNORMAL HIGH (ref 6–23)
Calcium: 10.1 mg/dL (ref 8.4–10.5)
Chloride: 97 mEq/L (ref 96–112)
Potassium: 5.4 mEq/L — ABNORMAL HIGH (ref 3.5–5.1)
Sodium: 131 mEq/L — ABNORMAL LOW (ref 135–145)
Total Protein: 7.4 g/dL (ref 6.0–8.3)

## 2013-05-07 NOTE — Progress Notes (Signed)
Patient ID: Allison Rodgers, female   DOB: March 10, 1924, 79 y.o.   MRN: [id number]    Reason for Appointment : Consultation for Type 2 Diabetes  History of Present llness          Diagnosis: Type 2 diabetes mellitus, date of diagnosis: ? 2007       Past history:  The patient and her son have very little information regarding her diabetes history  It appears that she had blood sugars over 500 at the time of diagnosis She was started on insulin at the time of diagnosis and has been continuing on this in various regimens Also her weight has been previously significantly more, about 200 pounds  Recent history: The patient is  at the nursing home and will be going to assisted living On her last visit she was taking 30 units of Lantus plus Amaryl and low dose Januvia with poor control Her Lantus was changed to twice a day and the total daily dose was increased on the last visit She has started eating somewhat better since the last visit since he does not have any further swallowing difficulty  She still has significantly high readings recently despite eating small portions  Appears to have significantly high readings after meals since fasting readings are the lowest of the day Also because of variable intake her blood sugars are inconsistent throughout the day    INSULIN regimen is described as:  Lantus 18--14   Glucose monitoring:  done 2 times  a day         Glucometer: ?     Blood Glucose readings from records faxed over for the last 5 days show fasting readings 136-213, lunch  168-339  acs 210-309 hs 215-318           Hypoglycemia frequency: Never.           Self-care: The diet that the patient has been following [id number] to limit carbohydrates.      Meals: 3 meals per day.  poor appetite    Physical activity: exercise: not able to walk much Dietician consultations: Previously.                Oral hypoglycemic drugs the patient is taking are: None now       Side effects from  medications have been: None reported  Retinal exam: Most recent:.2014     Lab Results  Component Value Date   HGBA1C 9.9* 03/24/2013    Filed Weights   05/07/13 1443  Weight: 168 lb 1.6 oz (76.25 kg)      Medication List       This list is accurate as of: 05/07/13  2:58 PM.  Always use your most recent med list.               acetaminophen 500 MG tablet  Commonly known as:  TYLENOL  500-1,000 mg every 6 (six) hours as needed for pain. Per bottle as needed for pain     ALPRAZolam 0.25 MG tablet  Commonly known as:  XANAX  Take 1 tablet (0.25 mg total) by mouth every 6 (six) hours as needed for anxiety.     aspirin 81 MG chewable tablet  Chew 81 mg by mouth daily.     CALCIUM PLUS VITAMIN D PO  Take 1 tablet by mouth daily.     chlorpheniramine 4 MG tablet  Commonly known as:  CHLOR-TRIMETON  Take 4 mg by mouth 2 (two) times daily as needed (for  nasal drip, drainage).     dorzolamide 2 % ophthalmic solution  Commonly known as:  TRUSOPT  Place 1 drop into both eyes at bedtime.     fluocinonide cream 0.05 %  Commonly known as:  LIDEX  Apply 1 application topically 2 (two) times daily as needed (rash).     furosemide 80 MG tablet  Commonly known as:  LASIX  Take 1 tablet (80 mg total) by mouth daily.     glimepiride 2 MG tablet  Commonly known as:  AMARYL  Take 2 mg by mouth daily with breakfast.     guaiFENesin-dextromethorphan 100-10 MG/5ML syrup  Commonly known as:  ROBITUSSIN DM  Take 5 mLs by mouth 3 (three) times daily as needed for cough.     insulin glargine 100 UNIT/ML injection  Commonly known as:  LANTUS  Inject 30 Units into the skin at bedtime. Uses flexpen     pantoprazole 40 MG tablet  Commonly known as:  PROTONIX  Take 1 tablet (40 mg total) by mouth 2 (two) times daily.     potassium chloride 10 MEQ tablet  Commonly known as:  K-DUR  Take 2 tablets (20 mEq total) by mouth 2 (two) times daily.     sitaGLIPtin 50 MG tablet  Commonly  known as:  JANUVIA  Take 50 mg by mouth daily.     sodium chloride 0.65 % nasal spray  Commonly known as:  OCEAN  Place 2 sprays into the nose as needed for congestion.     spironolactone 100 MG tablet  Commonly known as:  ALDACTONE  Take 1 tablet (100 mg total) by mouth 2 (two) times daily.     XALATAN 0.005 % ophthalmic solution  Generic drug:  latanoprost  1 drop at bedtime.        Allergies:  Allergies  Allergen Reactions  . Penicillins Other (See Comments)    REACTION: causes hallucinations  . Sulfamethoxazole W-Trimethoprim Swelling    REACTION: rash  . Bactrim   . Trimethoprim     Past Medical History  Diagnosis Date  . GERD (gastroesophageal reflux disease)   . Chronic abdominal pain   . DJD (degenerative joint disease)   . Hypertensive cardiovascular disease   . Obesity   . Gastritis, chronic   . 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 Esophageal stricture   . Hiatal hernia   . Hx of adenomatous colonic polyps   . Diverticulosis   . Dyslipidemia   . Arthritis   . DM (diabetes mellitus)   . Hypertension   . Microcytic anemia   . Edema   . Renal disorder     Past Surgical History  Procedure Laterality Date  . Appendectomy    . Cholecystectomy  1999  . Vesicovaginal fistula closure w/ tah  1957  . Bilateral oophorectomy  1957  . Hernia repair  08/2005    umbilical and ventral hernia repair by Dr. Jamey Ripa  . Rotator cuff repair  7/05    right  . Back surgery      Family History  Problem Relation Age of Onset  . Stroke Mother   . Cancer Father   . Hypertension Other     Social History:  reports that she has never smoked. She has never used smokeless tobacco. She reports that she does not drink alcohol or use illicit drugs.    Review of Systems       Lipids: She is  currently not on any medications         She feels  her feet are somewhat tender on the bottom on walking  Dysphagia: None now   Physical  Examination:  BP 118/68  Pulse 83  Temp(Src) 98.5 F (36.9 C)  Resp 10  Ht 5' (1.524 m)  Wt 168 lb 1.6 oz (76.25 kg)  BMI 32.83 kg/m2  SpO2 97%          ASSESSMENT:  Diabetes type 2, uncontrolled She appears to have persistent hyperglycemia recently despite eating small portions and increasing her insulin as well as using twice a day dosage Appears to have significantly high readings after meals since fasting readings are the lowest of the day Also because of variable intake her blood sugars are inconsistent throughout the day  PLAN:   Lantus 24 units ain am and keep 14 in pm Start Humalog 6 units before each meal No need for any oral hypoglycemic drugs   Leaira Fullam 05/07/2013, 2:58 PM    Addendum: Potassium 5.4, will stop potassium supplements and reduce her Aldactone to 1 tablet

## 2013-05-07 NOTE — Patient Instructions (Addendum)
Lantus 24 units ain am and keep 14 in pm Start Humalog 6 units before each meal  Write GLUCOSE LEVELS LEGIBLY  Fax readings in 1 week to [phone]

## 2013-05-10 ENCOUNTER — Encounter: Payer: Self-pay | Admitting: Adult Health

## 2013-05-10 ENCOUNTER — Telehealth: Payer: Self-pay | Admitting: Endocrinology

## 2013-05-10 DIAGNOSIS — I150 Renovascular hypertension: Secondary | ICD-10-CM | POA: Insufficient documentation

## 2013-05-10 DIAGNOSIS — H409 Unspecified glaucoma: Secondary | ICD-10-CM | POA: Insufficient documentation

## 2013-05-10 DIAGNOSIS — E871 Hypo-osmolality and hyponatremia: Secondary | ICD-10-CM | POA: Insufficient documentation

## 2013-05-10 DIAGNOSIS — E119 Type 2 diabetes mellitus without complications: Secondary | ICD-10-CM | POA: Insufficient documentation

## 2013-05-10 NOTE — Progress Notes (Signed)
Patient ID: Allison Rodgers, female   DOB: Jun 05, 1924, 79 y.o.   MRN: [id number]       PROGRESS NOTE  DATE: 05/07/2013   FACILITY: [facility]  LEVEL OF CARE: SNF (31)  Acute Visit  CHIEF COMPLAINT:  Discharge Notes  HISTORY OF PRESENT ILLNESS: This is an 79 year old female who is for discharge to an Assisted   Living Facility with home health PT, OT and nursing. She has been to [facility] on 04/05/13 from [facility] in with principal problems of acute encephalopathy and bacterial meningitis/ viral encephalitis.  Patient was admitted to this facility for short-term rehabilitation after the patient's recent hospitalization.  Patient has completed SNF rehabilitation and therapy has cleared the patient for discharge.  Reassessment of ongoing problem(s):  HTN: Pt 's HTN remains stable.  Denies CP, sob, DOE, pedal edema, headaches, dizziness or visual disturbances.  No complications from the medications currently being used.  Last BP : 103/63  DM:pt's DM remains stable.  Pt denies polyuria, polydipsia, polyphagia, changes in vision or hypoglycemic episodes.  No complications noted from the medication presently being used.  8/14 hemoglobin A1c 9.9  DEMENTIA: The dementia remaines stable and continues to function adequately in the current living environment with supervision.  The patient has had little changes in behavior. No complications noted from the medications presently being used.  PAST MEDICAL HISTORY : Reviewed.  No changes.  CURRENT MEDICATIONS: Reviewed per [facility]  REVIEW OF SYSTEMS:  GENERAL: no change in appetite, no fatigue, no weight changes, no fever, chills or weakness RESPIRATORY: no cough, SOB, DOE, wheezing, hemoptysis CARDIAC: no chest pain, edema or palpitations GI: no abdominal pain, diarrhea, constipation, heart burn, nausea or vomiting  PHYSICAL EXAMINATION  VS:  T 97       P86        RR 18       BP 103/63       POX 98  %       WT 167.8  (Lb)  GENERAL: no acute distress, normal body habitus EYES: conjunctivae normal, sclerae normal, normal eye lids NECK: supple, trachea midline, no neck masses, no thyroid tenderness, no thyromegaly LYMPHATICS: no LAN in the neck, no supraclavicular LAN RESPIRATORY: breathing is even & unlabored, BS CTAB CARDIAC: RRR, no murmur,no extra heart sounds, no edema GI: abdomen soft, normal BS, no masses, no tenderness, no hepatomegaly, no splenomegaly PSYCHIATRIC: the patient is alert & oriented to person, affect & behavior appropriate  LABS/RADIOLOGY:  04/28/13 sodium 129 potassium 5.0 glucose 258 BUN 40 creatinine 2.9 calcium 10.1 04/20/13 sodium 127 potassium 5.1 glucose 271 BUN 60 creatinine 3.0 calcium 10.2 04/13/13 sodium 129 potassium 5.3 glucose 284 BUN 56 creatinine 2.6 calcium 10.6 04/05/13 sodium 132 potassium 3.6 glucose 102 BUN 29 creatinine 1.43 calcium 9.0 WBC 17.2 hemoglobin 13.7 hematocrit 41.6  ASSESSMENT/PLAN:  Bacterial meningitis/bilateral encephalitis - done with doxycycline treatment.  Diabetes mellitus - well-controlled; continue Amaryl, Januvia and Lantus  Hypertension - well controlled  Dementia - stable  CK D stage III - stable  GERD - stable  Hypokalemia - continue supplementation  Hyponatremia - recently decreased Lasix dosage to 40 mg PO Q D   I have filled out patient's discharge paperwork and written prescriptions.  Patient will receive home health PT, OT and Nursing.  Total discharge time: Greater than 30 minutes Discharge time involved coordination of the discharge process with social worker, nursing staff and therapy department. Medical justification for home health  services verified.   CPT CODE: 16109

## 2013-05-10 NOTE — Assessment & Plan Note (Signed)
Controlled.  

## 2013-05-10 NOTE — Assessment & Plan Note (Signed)
Cont DM care under Dr. Lucianne Muss  Diet discussed

## 2013-05-10 NOTE — Progress Notes (Signed)
Patient ID: Allison Rodgers, female   DOB: 1924-03-29, 79 y.o.   MRN: [id number]        HISTORY & PHYSICAL  DATE: 04/07/2013   FACILITY: [facility]  LEVEL OF CARE: SNF (31)  ALLERGIES:  Allergies  Allergen Reactions  . Penicillins Other (See Comments)    REACTION: causes hallucinations  . Sulfamethoxazole W-Trimethoprim Swelling    REACTION: rash  . Bactrim   . Trimethoprim     CHIEF COMPLAINT:  Manage diabetes mellitus, chronic kidney disease stage III, and hypertension.    HISTORY OF PRESENT ILLNESS:  The patient is an 79 year-old, African-American female who was hospitalized for acute encephalopathy secondary to bacterial meningitis versus viral encephalitis.  After hospitalization, she is admitted to this facility for short-term rehabilitation.  She has the following problems:    DM:pt's DM is unstable.  Pt denies polyuria, polydipsia, polyphagia, changes in vision or hypoglycemic episodes.  No complications noted from the medication presently being used.  Last hemoglobin A1c is:  9.9.      CHRONIC KIDNEY DISEASE: The patient's chronic kidney disease remains stable.  Patient denies increasing lower extremity swelling or confusion. Last BUN and creatinine are:   BUN 29, creatinine 1.43.    HTN: Pt 's HTN remains stable.  Denies CP, sob, DOE, pedal edema, headaches, dizziness or visual disturbances.  No complications from the medications currently being used.  Last BP :  126/88.    PAST MEDICAL HISTORY :  Past Medical History  Diagnosis Date  . GERD (gastroesophageal reflux disease)   . Chronic abdominal pain   . DJD (degenerative joint disease)   . Hypertensive cardiovascular disease   . Obesity   . Gastritis, chronic   . 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 Esophageal stricture   . Hiatal hernia   . Hx of adenomatous colonic polyps   . Diverticulosis   . Dyslipidemia   . Arthritis   . DM (diabetes mellitus)   .  Hypertension   . Microcytic anemia   . Edema   . Renal disorder     PAST SURGICAL HISTORY: Past Surgical History  Procedure Laterality Date  . Appendectomy    . Cholecystectomy  1999  . Vesicovaginal fistula closure w/ tah  1957  . Bilateral oophorectomy  1957  . Hernia repair  08/2005    umbilical and ventral hernia repair by Dr. Jamey Ripa  . Rotator cuff repair  7/05    right  . Back surgery      SOCIAL HISTORY:  reports that she has never smoked. She has never used smokeless tobacco. She reports that she does not drink alcohol or use illicit drugs.  FAMILY HISTORY:  Family History  Problem Relation Age of Onset  . Stroke Mother   . Cancer Father   . Hypertension Other     CURRENT MEDICATIONS: Reviewed per [facility]  REVIEW OF SYSTEMS:  See HPI otherwise 14 point ROS is negative.  PHYSICAL EXAMINATION  VS:  T 98.5       P 86      RR 18      BP 148/71      POX 97%        WT (Lb) 177  GENERAL: no acute distress, morbidly obese body habitus EYES: conjunctivae normal, sclerae normal, normal eye lids MOUTH/THROAT: lips without lesions,no lesions in the mouth,tongue is without lesions,uvula elevates in midline NECK: supple, trachea midline,  no neck masses, no thyroid tenderness, no thyromegaly LYMPHATICS: no LAN in the neck, no supraclavicular LAN RESPIRATORY: breathing is even & unlabored, BS CTAB CARDIAC: RRR, no murmur,no extra heart sounds EDEMA/VARICOSITIES:  +1 bilateral lower extremity edema  ARTERIAL:  pedal pulses +1   GI:  ABDOMEN: abdomen soft, normal BS, no masses, no tenderness  LIVER/SPLEEN: no hepatomegaly, no splenomegaly MUSCULOSKELETAL: HEAD: normal to inspection & palpation BACK: no kyphosis, scoliosis or spinal processes tenderness EXTREMITIES: LEFT UPPER EXTREMITY: full range of motion, normal strength & tone RIGHT UPPER EXTREMITY:  full range of motion, normal strength & tone LEFT LOWER EXTREMITY: strength intact, range of motion moderate   RIGHT  LOWER EXTREMITY: strength intact, range of motion moderate   PSYCHIATRIC: the patient is alert & oriented to person, affect & behavior appropriate  LABS/RADIOLOGY: CT of the head:  No acute findings.    CT of the abdomen:  No acute findings.    Urinalysis negative.    Chest x-ray:  No acute disease.    MRI/MRA of the head:  No acute findings.    Urine culture showed no growth.    Blood culture x2 showed no growth.    Glucose 102, BUN 29, creatinine 1.43, otherwise BMP normal.    Magnesium 1.7, phosphorus 3.2, total protein 9.2, otherwise liver profile normal.    Ammonia level 20.    WBC 17.2, MCV 72.7, otherwise CBC normal.    Troponin-I less than 0.03.    ASSESSMENT/PLAN:  Diabetes mellitus with renal complications.  Uncontrolled.  Monitor CBGs.    Chronic kidney disease stage III.  Reassess.    Renovascular hypertension.  Blood pressure borderline.  We will monitor for now.     Bacterial meningitis.  Currently on doxycycline.    Hypokalemia.  Continue supplementation.  We will reassess.    GERD.  Well controlled.     Check CBC and BMP.    I have reviewed patient's medical records received at admission/from hospitalization.  CPT CODE: 40981

## 2013-05-10 NOTE — Progress Notes (Signed)
Subjective:     Patient ID: Allison Rodgers, female   DOB: [birth year]   MRN: [id number]  Brief patient profile:  66  yobf with morbid obesity complicated by DM, HTN and Dyslipidemia and chronic venous insufficiency with dependent edema.    August 28, 2010 cpx getting all better. limited by legs/hip/ knees no sob or increase leg swelling. >>labs done w/no rx changes   September 11, 2010 --Pt here for follow and med calendar. Last ov with CPX. Labs were w/no significant changes. A1C at 6.7 , s.CR holding at 1.4 . We reviewed all her labs in detail.Urine did show microscopic hematuria . Discussed a DM and low cholestrol diet. She is recovering from a prolonged bronchitis-feeling better but not back to baseline. She has clear drainge and energy level is low. We reviewed all her meds and organized her med calendar.  rec follow calendar   05/10/2011. Follow up and med review.  Seen 2 weeks ago for follow up . Labs showed BS elevated at 300 nonfasting.  Disucssed diet and exercise. Weight is going down with dietary changes.  Will check A1C today to evaluate average control.   We reviewed all her meds today and updated her med calendar w/ pt education  Appears she is taking her meds correctly.   Complains that she has seen small specks of red blood on tissues with wiping after bowel movement   Last colonoscopy was 2009.  No abdominal pain or n/v/d.  rec Follow med calendar closely and bring to each visit.  Use Preparation H Hemorrhoid cream after bowel movement  Low sweet diet.   Flu shot today   07/26/2011 Wert/ ov cc Pt c/o increased edema "from the hips down" x 2 months - taking an extra furosemide as per calendar "maybe once a week" with no benefit (calendar clearly says take one extra daily if needed for swelling).  Denies orthopnea. Walked up [place] to office on day of ov s doe. Denies salt excess and takes her maintenance doses of furosemide and spironlactone as per calendar  instructions rec Change the as needed furosemide to 40 mg 2 extra daily if needed (can use up to 2 extra daily or a total of 4) Watch the salt and salty food  08/23/2011 f/u ov/Wert cc no better swelling in legs on max doses of furosemide as per calendar, new  Bifrontal L > R bandlike pressure  HA typically after lunch daily x sev weeks, ? viz Floaters not directely related to ha with no nausea  Or sinus complaints, better p tylenol and never in ams rec Please see patient coordinator before you leave today  to schedule venous dopplers> neg bilateral 08/27/11 Stop all caffeine - pay attention to the chronologic pattern of the headache and try alprazolam to see if it helps and call if worsens See [name] for arthritis > did not go     11/25/2011 f/u ov/Wert for multiple chronic medical problems cc worse arthritis esp R back and leg x one year, swelling is some better on present rx reviewed with her on med calendar. No sob, cough. Better control of leg swelling, no overt polydypsia, polyuria, viz changes rec Be sure to see Dr Gladstone Lighter about your back and leg pain Please remember to go to the lab  department downstairs for your tests - we will call you with the results when they are available.   03/02/2012 f/u ov/Wert not using med calendar cc  c/o having slight  HA x 3 months and states "feels bad and weak all of the time". Sugars fasting running as high has 300 fasting on glimepririede 2 mg one half each am, persistent leg swelling despite furosemide and lasix. Admits getting very forgetful re details of care and trying to use the alphabetized epic list for med rec purposes.  HA is generalized, in pms, better with tylenol with no assoc viz or neuro c/os or nausea, always absent in ams >>Change the furosemide to 2 in am and at lunch with additonal 2 at supper if needed  Increase Glimperide to 2 mg each am   03/18/2012 Follow up and MMSE /Med calendar  We reviewed all her meds and organized  them into a med calendar with pt education  She has a lot of old bottles that she says she keeps and put new meds into old bottles  Advised this is not a good idea.  Says her memory is slipping. Gets anxious when she can not remember things. Has gotten lost a couple of times. Son in [place]. She is considering going to live with him or him come here.  Does not want to go to NH.  MMSE scored a 29/30 but slow responses.  Clock draw was very abnormal. With hands of clock outside clock.  We dicussed referral to neuro due to concern for possible dementia  She refuses to go to neuro wants to be treated here.  Labs were unremarkable with neg RPR , nml tsh and nml b12 She denies headache, visual/speech changes.  Does have anxiety at times. No significant depression like symptoms.  Sugars has been running 200-250 . Amaryl was increased 2 weeks ago w/ A1C at 8.4  No low sugars.    04/15/2012 f/u ov/Wert confused with instructions, meds, "son took her list back to [place]" felt worse x 2 weeks with weak and dizziness but no specific pattern (not related to meals).  Fasting bs remain in low 200s  >med review   04/29/12 Follow up and med review     [name] Her last visit. Patient was having difficulty remembering her medications and had misplaced her medication calendar. She is brought all of her medications in today. We reviewed all her medications updated. Her medication calendar with patient education. It does appear that all her medications are up-to-date and have been refilled on time, according to the medication bottles She does easily get confused if you cannot slow down and go over the medications and with slowly We discussed the importance of keeping her list up to date and keep it with her at all times especially when she is seen at the doctor's office Blood sugars are slowly decreasing with her medication adjustments recently She has  had no low sugars She denies any chest pain, shortness of breath, abdominal pain, or increased edema We discussed once again about her memory and referral to neurology. However, she declined Referral  05/28/2012 Acute OV  Complains of right back and hip pain.  pt fell at home 05-25-12, right hip and groin area/right elbow are sore.   reports lost her balance and fell forward landing on right side.  She was able to get herself, and ambulates with minimal pain. Over the last 2 days, has been progressively worsening over and tender along the right side of her lower back into her right hip and right groin area, especially with walking. She denies any urinary symptoms, chest pain, loss of  consciousness, visual or speech changes, extremity weakness, radicular symptoms, or leg swelling >xray w/ no acute process   06/08/2012 Follow up  Returns for follow up for hip and back pain, seen 2 weeks ago after a fall.  Xrays showed no fracture.  Feeling better but still some residual soreness.  Does have some drainage and dry cough for 2 weeks.  rec Alternate ice and heat to back and hip  May use Tylenol arthritis for pain As needed   IF severe pain may use Roxicet As needed  -may cause you to be sleepy and off balance.   06/18/2012 f/u ov/Wert cc  Hips/ back better, no need for narcotic rx.  Memory no better on aricept and pt wants to stop.  Seems to using med calendar well. rec Stop fluticasone and the aricept Dymista one twice daily GERD .     09/16/2012 f/u ov/Wert cc pt states having increase sob, wheezing when moving and sitting still. along with heart palp. x 49month - seems worse around 4pm and better p supper >>RAST + /IGE ~319 , CT sinus neg   09/29/2012 Follow u p Pt returns for a 2 week follow up and med review  Her son from [place] is in town. He is trying to help her with her meds  We organized all her meds and updated her med calendar w/ pt education  She has a couple of old/expired  bottles -it is unclear if she is putting new meds into old bottles or they are expired.  She does admit she does not take her fluid pills as recommended.  I emphasized the importance of med compliance.  Son is concerned regarding memory . She does have mild forgetfullness but mostly seems to not process information well sometimes.  Repeated MMSE exam today. Tested well w/ good flow at 30/30 . Clock draw was slightly abn as she wrote the time instead of the hands of clock.   Previous MMSE 03/2012 29/30 . rpr /b12 nml 8/13 .  Labs last ov showed A1C tr up at 8.4.  No polyuria/polydipsia.  Previously started on Aricept 03/2012 . Stoppled 06/2012   12/22/2012 f/u ov/Wert re dm/hbp using calendar well  Chief Complaint  Patient presents with  . Follow-up    She c/o numbness and pain in her right arm since Sept 2013. She has been having a hard time picking things up. She also c/o continuing to have black stools and vomiting several times per month.  vomiting sev x per months, assoc with nausea,  black stools daily, no abd pain, maintaining on asa and ppi/hs h2. No wt loss.  Not clear whether has arm/ hand are really weak or numb or just weak due to pain when gripping, persistent daily symptoms snc Sept 2013.  >>referral to GI and Neuro   03/24/2013 Follow up  She returns for a follow up office visit.  Seen by Dr. Sharlett Iles earlier this year in May  Underwent endoscopy 12/30/12 that was unremarkable except for H Pylori .  Tx w/ PYLERA therapy. She complains that stomach still upset on/off and dark stools on /off. Last colonscopy 2009 with severe diverticular dz. HBG in 12/2012 was Nml.  Was suppose to follow up with Neurology however appointment was changed .  Continues to have memory issues.  Son is with her today but is leaving to go back home.  She is leaving alone . We went over meds it is obvious that she is unclear of what she  is taking and gets confused easily.  BS at home 100-200 . Does have  polyuria.  No fever, chest pain, dyspnea, edema.  ? taking Mobic on/off.  >>refer to GI , d/c mobic   05/05/13 Post Hospital follow up  Admitted 8/19-25 for acute encephalopathy with DKA , possible Mennigitis Bacterial vs Viral . Tx with abx and antivirals. CT head with mod atrophy, no acute finding.  DM meds were adjusted.  Discharged to rehab center.  Returns today with son, feeling better. Has been undergoing rehab with PT /OT. Getting stronger but still weak.  Is going to transfer this week to Assisted Living.  Has been referred to Endocrinology with Dr Lucianne Muss for DM management.-Lantus was changed to 18 u in am and 14 u in pm. Amaryl/Januvia were stopped.   Was referred back to Neuro for dementia - felt aricept +/- namenda may be indicated but will hold off for now d/t recent acute illness. Leave to PCP for decision in future.         ROS  The following are not active complaints unless bolded sore throat, dysphagia, dental problems, itching, sneezing,  nasal congestion or excess/ purulent secretions, ear ache,   fever, chills, sweats, unintended wt loss, pleuritic or exertional cp, hemoptysis,  orthopnea pnd or leg swelling, presyncope, palpitations, heartburn, abdominal pain, anorexia,    dysuria,hematuria,  rash, arthralgias, visual complaints, headache, numbness weakness or ataxia or problems with walking or coordination,  change in mood/affect or memory.             Allergies    1) Pcn  2) Bactrim      Family History:   asthma in her brother who also has hypertension  mother died of stroke, also had diabetes and arthritis  father had multiple myeloma  4 siblings all in relatively good health  Neg for dementia     Social History:  Patient never smoked.  positive for second-hand smoke exposure  exercises 2-3 times a week  1 cup caffeine a day  married  1 child       Past Medical History:  GERD (ICD-530.81)  ABDOMINAL PAIN, CHRONIC (ICD-789.00)  FATIGUE, CHRONIC  (ICD-780.79)  DEGENERATIVE JOINT DISEASE (ICD-715.90)  - L5 radiculopathy confirmed by mri 08/2005  --refer to ortho August 24, 2008 >>> Dr Darrelyn Hillock  HYPERTENSIVE CARDIOVASCULAR DISEASE (ICD-402.90)  OBESITY (ICD-278.00)  - Target wt = 158 for BMI < 30  - Referred [facility] 04/2005  GASTRITIS, CHRONIC (ICD-535.10)  BENIGN NEOPLASM OTH&UNSPEC SITE DIGESTIVE SYSTEM (ICD-211.9)  ESOPHAGEAL STRICTURE (ICD-530.3)..............................................Marland KitchenPatterson  - EGD 12/14/07  HIATAL HERNIA (ICD-553.3)  COLONIC POLYPS, ADENOMATOUS (ICD-211.3)  - Colonoscopy 12/14/07  DIVERTICULOSIS, COLON (ICD-562.10)  DYSLIPIDEMIA (ICD-272.4)  ARTHRITIS (ICD-716.90)  DM (ICD-250.00)  HYPERTENSION (ICD-401.9)  HEALTH MAINTENANCE  - dt 03/2003  - Pneumovax 03/2003  - CPX August 28, 2010  -Mammogram 10/2009, ? abn asymmetry >>repeat ok, last mammo 4/13 >neg  MICROCYTIC ANEMA  - DX 02/03/09, Stool g neg x 6, rx Fe 65 mg /day > improving March 17, 2009  Edema--08/10/08--venous doppler neg for dvt, ?ruptured bakers cyst-right  March 20, 2009 repeat venous doppler > neg bilaterally  May 21, 2010 repeat venous dopplers >> neg bilat  Complex med regimen>>Meds reviewed with pt education and computerized med calendar September 11, 2010 , 05/10/2011 , 03/18/2012 . 04/29/12 Memory loss  MMSE 03/18/2012 > 29/30 , Clock Draw abn , neg RPR , nml b12 , 09/29/2012 >30/30 , clock abn >refer to neuro  09/29/2012 RAST + w/ IGE at 319        Objective:   Physical Exam obese ambulatory bf nad  Can no longer get on exam table without assistance with nl vital signs wt 213 10/21/08 > 204 April 26, 2009 > 08/23/2011  191> 11/25/2011  201 > 03/02/2012  193 > 198 03/18/2012 > 186 04/15/2012 >191 05/28/2012 >184 06/08/2012 > 06/18/2012 186 > 189 09/16/2012 > 12/22/2012  198 > 183 03/24/2013  >168 05/05/13  HEENT: nl dentition, turbinates, and orophanx. Nl external ear canals without cough reflex  Neck without JVD/Nodes/TM  Lungs  clear to A and P bilaterally without cough on insp or exp maneuvers  RRR no s3 or murmur or increase in P2,tr -1+ sym bilaterally  Abd soft and benign with nl excursion in the supine position. No bruits or organomegaly   EXT/Skin  :  venous insufficiency changes , no rash  Neuro:  No focal motor deficits     Assessment:        Plan:

## 2013-05-10 NOTE — Progress Notes (Signed)
Patient ID: Allison Rodgers, female   DOB: Aug 20, 1923, 79 y.o.   MRN: [id number]       PROGRESS NOTE  DATE: 04/21/2013  FACILITY:  [facility]  LEVEL OF CARE: SNF (31)  Acute Visit  CHIEF COMPLAINT:  Manage Hyponatremia  HISTORY OF PRESENT ILLNESS: This is an 79 year old female who was noted to have NA 127 - low. She is currently taking Lasix 80 mg PO Q D. BPs has been stable - well-controlled.  PAST MEDICAL HISTORY : Reviewed.  No changes.  CURRENT MEDICATIONS: Reviewed per [facility]  REVIEW OF SYSTEMS:  GENERAL: no change in appetite, no fatigue, no weight changes, no fever, chills or weakness RESPIRATORY: no cough, SOB, DOE,, wheezing, hemoptysis CARDIAC: no chest pain, edema or palpitations GI: no abdominal pain, diarrhea, constipation, heart burn, nausea or vomiting  PHYSICAL EXAMINATION  VS:  T96.4        P74       RR19       BP128/72     POX96 %       WT169.8 (Lb)  GENERAL: no acute distress, normal body habitus NECK: supple, trachea midline, no neck masses, no thyroid tenderness, no thyromegaly LYMPHATICS: no LAN in the neck, no supraclavicular LAN RESPIRATORY: breathing is even & unlabored, BS CTAB CARDIAC: RRR, no murmur,no extra heart sounds, no edema GI: abdomen soft, normal BS, no masses, no tenderness, no hepatomegaly, no splenomegaly PSYCHIATRIC: the patient is alert & oriented to person, affect & behavior appropriate  LABS/RADIOLOGY: 04/20/13 sodium 127 potassium 5.1 glucose 271 BUN 60 creatinine 3.0 calcium 10.2 04/13/13 sodium 129 potassium 5.3 glucose 284 BUN 56 creatinine 2.6 calcium 10.6 04/05/13 sodium 132 potassium 3.6 glucose 102 BUN 29 creatinine 1.43 calcium 9.0 WBC 17.2 hemoglobin 13.7 hematocrit 41.6  ASSESSMENT/PLAN:  Hyponatremia - decrease Lasix to 40 mg one tab by mouth daily; BMP in one week  CPT CODE: 40981

## 2013-05-10 NOTE — Assessment & Plan Note (Signed)
Hold on memory meds for now  In future may benefit with aricept /namenda.  Evaluate on return if more stable

## 2013-05-11 ENCOUNTER — Telehealth: Payer: Self-pay | Admitting: Internal Medicine

## 2013-05-11 NOTE — Telephone Encounter (Signed)
ATC Zella Richer, no answer [facility]

## 2013-05-12 ENCOUNTER — Telehealth: Payer: Self-pay | Admitting: *Deleted

## 2013-05-12 NOTE — Telephone Encounter (Signed)
I spoke with [facility] and made her aware. Nothing further needed

## 2013-05-12 NOTE — Telephone Encounter (Signed)
Instructions to be sent on fax: Reduce evening Lantus from 14 down to 10 units and may give Humalog right after eating if glucose under 80

## 2013-05-12 NOTE — Telephone Encounter (Signed)
[employer], spoke with [place].   Pt currently has insulin orders as follows: Humalog 6 units tid before meals Lantus 24 units qam and 14 units q evening. Dee reports yesterday am pt's BS was 72, yesterday midday it was 96, and this morning it was 77. This morning, pt refused to eat breakfast.   They are concerned about pt's BS bottoming out.   Above orders were received on Friday by Dr. Lucianne Muss - [facility]. Geraldine Contras is wanting to know if they can have some hold parameters for this so pt doesn't bottom out.   Advised this should probably be defered to Dr. Lucianne Muss given he is seeing pt for DM, but they are wanting to see if MW will be ok with doing this given he is PCP.  Dr. [name], do you want to advise on this or defer to Dr. Remus Blake office.  Please advise. Thank you.

## 2013-05-12 NOTE — Telephone Encounter (Signed)
Dee from [place] [facility] called, she wants to have a hold order for patient in case her sugars are running low, she said this morning fasting was 77

## 2013-05-12 NOTE — Telephone Encounter (Signed)
All  Questions re diet/ insulin to Dr Lucianne Muss - otherwise the left hand may not know what the right hand is doing

## 2013-05-13 ENCOUNTER — Telehealth: Payer: Self-pay | Admitting: Internal Medicine

## 2013-05-13 NOTE — Telephone Encounter (Signed)
Called and spoke with dee and she is aware of MW recs that the percocet should be prn.  Geraldine Contras stated that she will write up the order and fax this over so MW can sign this so the medication sig can be changed.  Nothing further is needed.

## 2013-05-13 NOTE — Telephone Encounter (Signed)
Percocet  should be prn

## 2013-05-13 NOTE — Telephone Encounter (Signed)
Spoke with [place] from [place].  She reports that pt is c/o being lethargic and shaky.  BS before lunch today was 62 so insulin was held.  Geraldine Contras has a call in to Dr Remus Blake office regarding this.  Also pt's order for Percocet with them is 1 every 6 hours and is not listed as prn.  Pt is not c/o pain.  Can they switch this to prn?

## 2013-05-17 NOTE — Telephone Encounter (Signed)
rx faxed to [facility]

## 2013-05-17 NOTE — Telephone Encounter (Signed)
Glucose history: morning 64 on 10/4 and has been under 100 on 10/3, will reduce evening Lantus to 6 units and morning Lantus by 4 units as well as Humalog to 4 units, to be faxed

## 2013-05-18 ENCOUNTER — Telehealth: Payer: Self-pay | Admitting: Internal Medicine

## 2013-05-18 NOTE — Telephone Encounter (Signed)
Forms given to me by Verlon Au  Will discuss with TP as she was the last provider to see pt on 9.24.14 for HFU Phone note routed to my inbox

## 2013-05-19 ENCOUNTER — Ambulatory Visit (INDEPENDENT_AMBULATORY_CARE_PROVIDER_SITE_OTHER): Payer: Medicare HMO | Admitting: Podiatry

## 2013-05-19 VITALS — BP 115/71 | HR 100 | Resp 20 | Ht 60.0 in | Wt 168.0 lb

## 2013-05-19 DIAGNOSIS — M79609 Pain in unspecified limb: Secondary | ICD-10-CM

## 2013-05-19 DIAGNOSIS — B351 Tinea unguium: Secondary | ICD-10-CM

## 2013-05-19 NOTE — Progress Notes (Signed)
  Subjective:    Patient ID: Allison Rodgers, female    DOB: 1924-03-15, 79 y.o.   MRN: [id number]  HPI This diabetic patient presents for ongoing debrided of mycotic nails.    Review of Systems  Constitutional: Positive for activity change, appetite change and unexpected weight change.  HENT: Positive for trouble swallowing.   Eyes: Negative.   Respiratory: Positive for chest tightness and shortness of breath.   Cardiovascular: Positive for palpitations.  Gastrointestinal: Positive for abdominal pain.  Endocrine: Positive for cold intolerance.  Musculoskeletal: Positive for back pain.  Skin: Negative.   Allergic/Immunologic: Negative.   Neurological: Positive for dizziness, tremors, speech difficulty, weakness and light-headedness.  Hematological: Negative.   Psychiatric/Behavioral: The patient is nervous/anxious.        Objective:   Physical Exam Hypertrophic elongated toenails x10        Assessment & Plan:  Onychomycoses with symptoms 6 through 10  All 10 toenails are debrided without bleeding. Reappoint x3 months.  Tranell Wojtkiewicz C.Leeanne Deed, DPM

## 2013-05-24 NOTE — Telephone Encounter (Signed)
Shanda Bumps, has this been filled out by [facility] yet? Thanks.

## 2013-05-28 ENCOUNTER — Telehealth: Payer: Self-pay | Admitting: Adult Health

## 2013-05-28 NOTE — Telephone Encounter (Signed)
Pt son stopped by office to check on status of these papers. Can we have them to him today? Please call son when papers are done. Or call son to let him know the status of these papers.

## 2013-05-28 NOTE — Telephone Encounter (Signed)
Pt son aware paperwork is completed. I placed copy in scan folder. Original left at front for son to pick-up. Carron Curie, CMA

## 2013-05-29 ENCOUNTER — Inpatient Hospital Stay (HOSPITAL_COMMUNITY)
Admission: EM | Admit: 2013-05-29 | Discharge: 2013-06-01 | DRG: 683 | Disposition: A | Discharge status: Home Health | Payer: Medicare PPO | Attending: Internal Medicine | Admitting: Internal Medicine

## 2013-05-29 ENCOUNTER — Encounter (HOSPITAL_COMMUNITY): Payer: Self-pay | Admitting: Emergency Medicine

## 2013-05-29 DIAGNOSIS — D1399 Benign neoplasm of ill-defined sites within the digestive system: Secondary | ICD-10-CM

## 2013-05-29 DIAGNOSIS — K222 Esophageal obstruction: Secondary | ICD-10-CM

## 2013-05-29 DIAGNOSIS — N179 Acute kidney failure, unspecified: Principal | ICD-10-CM

## 2013-05-29 DIAGNOSIS — E869 Volume depletion, unspecified: Secondary | ICD-10-CM

## 2013-05-29 DIAGNOSIS — Z823 Family history of stroke: Secondary | ICD-10-CM

## 2013-05-29 DIAGNOSIS — G039 Meningitis, unspecified: Secondary | ICD-10-CM

## 2013-05-29 DIAGNOSIS — N289 Disorder of kidney and ureter, unspecified: Secondary | ICD-10-CM

## 2013-05-29 DIAGNOSIS — N184 Chronic kidney disease, stage 4 (severe): Secondary | ICD-10-CM | POA: Diagnosis present

## 2013-05-29 DIAGNOSIS — E669 Obesity, unspecified: Secondary | ICD-10-CM | POA: Diagnosis present

## 2013-05-29 DIAGNOSIS — K219 Gastro-esophageal reflux disease without esophagitis: Secondary | ICD-10-CM

## 2013-05-29 DIAGNOSIS — E162 Hypoglycemia, unspecified: Secondary | ICD-10-CM

## 2013-05-29 DIAGNOSIS — I10 Essential (primary) hypertension: Secondary | ICD-10-CM

## 2013-05-29 DIAGNOSIS — D139 Benign neoplasm of ill-defined sites within the digestive system: Secondary | ICD-10-CM

## 2013-05-29 DIAGNOSIS — E871 Hypo-osmolality and hyponatremia: Secondary | ICD-10-CM

## 2013-05-29 DIAGNOSIS — R627 Adult failure to thrive: Secondary | ICD-10-CM | POA: Diagnosis present

## 2013-05-29 DIAGNOSIS — B3789 Other sites of candidiasis: Secondary | ICD-10-CM

## 2013-05-29 DIAGNOSIS — E1129 Type 2 diabetes mellitus with other diabetic kidney complication: Secondary | ICD-10-CM

## 2013-05-29 DIAGNOSIS — E876 Hypokalemia: Secondary | ICD-10-CM

## 2013-05-29 DIAGNOSIS — M1990 Unspecified osteoarthritis, unspecified site: Secondary | ICD-10-CM | POA: Diagnosis present

## 2013-05-29 DIAGNOSIS — G9340 Encephalopathy, unspecified: Secondary | ICD-10-CM

## 2013-05-29 DIAGNOSIS — I1310 Hypertensive heart and chronic kidney disease without heart failure, with stage 1 through stage 4 chronic kidney disease, or unspecified chronic kidney disease: Secondary | ICD-10-CM | POA: Diagnosis present

## 2013-05-29 DIAGNOSIS — N189 Chronic kidney disease, unspecified: Secondary | ICD-10-CM | POA: Diagnosis present

## 2013-05-29 DIAGNOSIS — H409 Unspecified glaucoma: Secondary | ICD-10-CM

## 2013-05-29 DIAGNOSIS — E1165 Type 2 diabetes mellitus with hyperglycemia: Secondary | ICD-10-CM

## 2013-05-29 DIAGNOSIS — I150 Renovascular hypertension: Secondary | ICD-10-CM

## 2013-05-29 DIAGNOSIS — R5381 Other malaise: Secondary | ICD-10-CM | POA: Diagnosis present

## 2013-05-29 DIAGNOSIS — E1149 Type 2 diabetes mellitus with other diabetic neurological complication: Secondary | ICD-10-CM

## 2013-05-29 DIAGNOSIS — IMO0001 Reserved for inherently not codable concepts without codable children: Secondary | ICD-10-CM

## 2013-05-29 DIAGNOSIS — F0390 Unspecified dementia without behavioral disturbance: Secondary | ICD-10-CM

## 2013-05-29 DIAGNOSIS — N183 Chronic kidney disease, stage 3 unspecified: Secondary | ICD-10-CM

## 2013-05-29 LAB — CBC WITH DIFFERENTIAL/PLATELET
Basophils Absolute: 0 10*3/uL (ref 0.0–0.1)
Basophils Relative: 0 % (ref 0–1)
Eosinophils Relative: 0 % (ref 0–5)
HCT: 42.2 % (ref 36.0–46.0)
Hemoglobin: 14 g/dL (ref 12.0–15.0)
MCH: 25 pg — ABNORMAL LOW (ref 26.0–34.0)
MCHC: 33.2 g/dL (ref 30.0–36.0)
MCV: 75.2 fL — ABNORMAL LOW (ref 78.0–100.0)
Monocytes Absolute: 1.2 10*3/uL — ABNORMAL HIGH (ref 0.1–1.0)
Monocytes Relative: 13 % — ABNORMAL HIGH (ref 3–12)
Neutro Abs: 6 10*3/uL (ref 1.7–7.7)
RDW: 13.3 % (ref 11.5–15.5)

## 2013-05-29 LAB — COMPREHENSIVE METABOLIC PANEL
ALT: 18 U/L (ref 0–35)
AST: 30 U/L (ref 0–37)
CO2: 25 mEq/L (ref 19–32)
Calcium: 10.8 mg/dL — ABNORMAL HIGH (ref 8.4–10.5)
Chloride: 86 mEq/L — ABNORMAL LOW (ref 96–112)
Creatinine, Ser: 3.5 mg/dL — ABNORMAL HIGH (ref 0.50–1.10)
GFR calc Af Amer: 13 mL/min — ABNORMAL LOW (ref >90)
GFR calc non Af Amer: 11 mL/min — ABNORMAL LOW (ref >90)
Glucose, Bld: 247 mg/dL — ABNORMAL HIGH (ref 70–99)
Total Bilirubin: 0.5 mg/dL (ref 0.3–1.2)

## 2013-05-29 LAB — URINALYSIS, ROUTINE W REFLEX MICROSCOPIC
Bilirubin Urine: NEGATIVE
Hgb urine dipstick: NEGATIVE
Ketones, ur: NEGATIVE mg/dL
Specific Gravity, Urine: 1.019 (ref 1.005–1.030)
Urobilinogen, UA: 0.2 mg/dL (ref 0.0–1.0)

## 2013-05-29 LAB — GLUCOSE, CAPILLARY

## 2013-05-29 MED ORDER — LATANOPROST 0.005 % OP SOLN
1.0000 [drp] | Freq: Every day | OPHTHALMIC | Status: DC
  Administered 2013-05-29 – 2013-05-31 (×3): 1 [drp] via OPHTHALMIC
  Filled 2013-05-29: qty 2.5

## 2013-05-29 MED ORDER — BIOTENE DRY MOUTH MT LIQD
15.0000 mL | Freq: Two times a day (BID) | OROMUCOSAL | Status: DC
  Administered 2013-05-31: 15 mL via OROMUCOSAL

## 2013-05-29 MED ORDER — PANTOPRAZOLE SODIUM 40 MG PO TBEC
40.0000 mg | DELAYED_RELEASE_TABLET | Freq: Two times a day (BID) | ORAL | Status: DC
  Administered 2013-05-30 – 2013-06-01 (×5): 40 mg via ORAL
  Filled 2013-05-29 (×6): qty 1

## 2013-05-29 MED ORDER — ALPRAZOLAM 0.25 MG PO TABS: 0.2500 mg | ORAL_TABLET | Freq: Four times a day (QID) | ORAL | Status: DC | PRN

## 2013-05-29 MED ORDER — INSULIN ASPART 100 UNIT/ML ~~LOC~~ SOLN: 0.0000 [IU] | Freq: Every day | SUBCUTANEOUS | Status: DC

## 2013-05-29 MED ORDER — ASPIRIN 81 MG PO CHEW
81.0000 mg | CHEWABLE_TABLET | Freq: Every day | ORAL | Status: DC
  Administered 2013-05-30 – 2013-06-01 (×3): 81 mg via ORAL
  Filled 2013-05-29 (×3): qty 1

## 2013-05-29 MED ORDER — DORZOLAMIDE HCL 2 % OP SOLN
1.0000 [drp] | Freq: Every day | OPHTHALMIC | Status: DC
  Administered 2013-05-29 – 2013-05-31 (×3): 1 [drp] via OPHTHALMIC
  Filled 2013-05-29: qty 10

## 2013-05-29 MED ORDER — ACETAMINOPHEN 500 MG PO TABS: 500.0000 mg | ORAL_TABLET | Freq: Four times a day (QID) | ORAL | Status: DC | PRN

## 2013-05-29 MED ORDER — FLUOCINONIDE 0.05 % EX CREA
1.0000 "application " | TOPICAL_CREAM | Freq: Two times a day (BID) | CUTANEOUS | Status: DC | PRN
  Filled 2013-05-29: qty 30

## 2013-05-29 MED ORDER — SALINE SPRAY 0.65 % NA SOLN
2.0000 | NASAL | Status: DC | PRN
  Filled 2013-05-29: qty 44

## 2013-05-29 MED ORDER — DEXTROSE 50 % IV SOLN
1.0000 | Freq: Once | INTRAVENOUS | Status: AC
  Administered 2013-05-29: 50 mL via INTRAVENOUS
  Filled 2013-05-29: qty 50

## 2013-05-29 MED ORDER — INSULIN GLARGINE 100 UNIT/ML ~~LOC~~ SOLN
14.0000 [IU] | Freq: Every day | SUBCUTANEOUS | Status: DC
  Administered 2013-05-30 – 2013-05-31 (×2): 14 [IU] via SUBCUTANEOUS
  Filled 2013-05-29 (×2): qty 0.14

## 2013-05-29 MED ORDER — OXYCODONE-ACETAMINOPHEN 5-325 MG PO TABS: 1.0000 | ORAL_TABLET | ORAL | Status: DC | PRN

## 2013-05-29 MED ORDER — DEXTROSE-NACL 5-0.9 % IV SOLN
INTRAVENOUS | Status: DC
  Administered 2013-05-29 – 2013-05-31 (×3): via INTRAVENOUS

## 2013-05-29 MED ORDER — INSULIN ASPART 100 UNIT/ML ~~LOC~~ SOLN
0.0000 [IU] | Freq: Three times a day (TID) | SUBCUTANEOUS | Status: DC
  Administered 2013-05-30: 5 [IU] via SUBCUTANEOUS
  Administered 2013-05-30: 2 [IU] via SUBCUTANEOUS
  Administered 2013-05-30: 1 [IU] via SUBCUTANEOUS
  Administered 2013-05-31 (×2): 2 [IU] via SUBCUTANEOUS

## 2013-05-29 MED ORDER — SODIUM CHLORIDE 0.9 % IV BOLUS (SEPSIS)
1000.0000 mL | Freq: Once | INTRAVENOUS | Status: AC
  Administered 2013-05-29: 1000 mL via INTRAVENOUS

## 2013-05-29 MED ORDER — HEPARIN SODIUM (PORCINE) 5000 UNIT/ML IJ SOLN
5000.0000 [IU] | Freq: Three times a day (TID) | INTRAMUSCULAR | Status: DC
  Administered 2013-05-29 – 2013-06-01 (×7): 5000 [IU] via SUBCUTANEOUS
  Filled 2013-05-29 (×11): qty 1

## 2013-05-29 MED ORDER — INSULIN ASPART 100 UNIT/ML ~~LOC~~ SOLN
4.0000 [IU] | Freq: Three times a day (TID) | SUBCUTANEOUS | Status: DC
  Administered 2013-05-31: 4 [IU] via SUBCUTANEOUS
  Administered 2013-06-01: 14:00:00 via SUBCUTANEOUS

## 2013-05-29 NOTE — ED Provider Notes (Signed)
CSN: [id number]     Arrival date & time 05/29/13  1815 History   First MD Initiated Contact with Patient 05/29/13 1829     Chief Complaint  Patient presents with  . Hypoglycemia   (Consider location/radiation/quality/duration/timing/severity/associated sxs/prior Treatment) HPI Comments: Patient brought in today by EMS from [facility] due to hypoglycemia.  Her blood sugar was apparently 48 earlier today.  Patient was given orange juice, which brought the blood sugar up to 91.  CBG upon arrival in the ED was 60.  Contacted staff at the Nursing Home and they report that the patient has had decreased PO intake over the past several weeks.  She is currently on Insulin.  Staff at the nursing home report that her insulin regimen was decreased two weeks ago secondary to decreased oral intake.  Staff report that the patient has not had a fever. Staff report that she has not had any vomiting.  Today staff report that the patient was given 18 units of Lantus at 8 AM this morning, 4 Unitis at 11:30 AM, and another 4 units at 4:30 PM.  Staff report that the patient is not on a sliding scale for her insulin.  Staff also report that the patient is at her baseline mentation.    The history is provided by the patient.    Past Medical History  Diagnosis Date  . GERD (gastroesophageal reflux disease)   . Chronic abdominal pain   . DJD (degenerative joint disease)   . Hypertensive cardiovascular disease   . Obesity   . Gastritis, chronic   . 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 Esophageal stricture   . Hiatal hernia   . Hx of adenomatous colonic polyps   . Diverticulosis   . Dyslipidemia   . Arthritis   . DM (diabetes mellitus)   . Hypertension   . Microcytic anemia   . Edema   . Renal disorder    Past Surgical History  Procedure Laterality Date  . Appendectomy    . Cholecystectomy  1999  . Vesicovaginal fistula closure w/ tah  1957  . Bilateral  oophorectomy  1957  . Hernia repair  08/2005    umbilical and ventral hernia repair by Dr. Jamey Ripa  . Rotator cuff repair  7/05    right  . Back surgery     Family History  Problem Relation Age of Onset  . Stroke Mother   . Cancer Father   . Hypertension Other    History  Substance Use Topics  . Smoking status: Never Smoker   . Smokeless tobacco: Never Used  . Alcohol Use: No   OB History   Grav Para Term Preterm Abortions TAB SAB Ect Mult Living                 Review of Systems  All other systems reviewed and are negative.    Allergies  Penicillins; Sulfamethoxazole-trimethoprim; Bactrim; and Trimethoprim  Home Medications   Current Outpatient Rx  Name  Route  Sig  Dispense  Refill  . aspirin 81 MG chewable tablet   Oral   Chew 81 mg by mouth daily.           . Calcium Carbonate (CALTRATE 600 PO)   Oral   Take 600 mg by mouth.         . chlorpheniramine (CHLOR-TRIMETON) 4 MG tablet   Oral   Take 4 mg by mouth 2 (two) times  daily as needed (for nasal drip, drainage).         . dorzolamide (TRUSOPT) 2 % ophthalmic solution   Both Eyes   Place 1 drop into both eyes at bedtime.         . fluocinonide cream (LIDEX) 0.05 %   Topical   Apply 1 application topically 2 (two) times daily as needed (rash).         . furosemide (LASIX) 40 MG tablet   Oral   Take 40 mg by mouth daily.         . insulin aspart (NOVOLOG) 100 UNIT/ML injection   Subcutaneous   Inject 4 Units into the skin 3 (three) times daily with meals.         . insulin glargine (LANTUS) 100 UNIT/ML injection   Subcutaneous   Inject 14 Units into the skin at bedtime. Uses flexpen to inject 18 units at 8 am and 14 units at 8 pm         . latanoprost (XALATAN) 0.005 % ophthalmic solution      1 drop at bedtime.         Marland Kitchen oxyCODONE-acetaminophen (PERCOCET) 5-325 MG per tablet   Oral   Take 1 tablet by mouth every 6 (six) hours as needed for pain.         . pantoprazole  (PROTONIX) 40 MG tablet   Oral   Take 1 tablet (40 mg total) by mouth 2 (two) times daily.   60 tablet   0   . spironolactone (ALDACTONE) 100 MG tablet   Oral   Take 1 tablet (100 mg total) by mouth 2 (two) times daily.   60 tablet   0   . acetaminophen (TYLENOL) 500 MG tablet      500-1,000 mg every 6 (six) hours as needed for pain. Per bottle as needed for pain         . ALPRAZolam (XANAX) 0.25 MG tablet   Oral   Take 1 tablet (0.25 mg total) by mouth every 6 (six) hours as needed for anxiety.   30 tablet   0   . guaiFENesin-dextromethorphan (ROBITUSSIN DM) 100-10 MG/5ML syrup   Oral   Take 5 mLs by mouth 3 (three) times daily as needed for cough.         . sodium chloride (OCEAN) 0.65 % nasal spray   Nasal   Place 2 sprays into the nose as needed for congestion.          BP 142/69  Pulse 87  Temp(Src) 97.7 F (36.5 C) (Oral)  Resp 18  SpO2 98% Physical Exam  Nursing note and vitals reviewed. Constitutional: She appears well-developed and well-nourished.  HENT:  Head: Normocephalic and atraumatic.  Mouth/Throat: Oropharynx is clear and moist. Mucous membranes are dry.  Eyes: EOM are normal. Pupils are equal, round, and reactive to light.  Neck: Normal range of motion. Neck supple.  Cardiovascular: Normal rate, regular rhythm and normal heart sounds.   Pulmonary/Chest: Effort normal and breath sounds normal.  Abdominal: Soft. Bowel sounds are normal. She exhibits no distension and no mass. There is no tenderness. There is no rebound and no guarding.  Musculoskeletal: Normal range of motion.  Neurological: She is alert.  Skin: Skin is warm and dry.  Psychiatric: She has a normal mood and affect.    ED Course  Procedures (including critical care time) Labs Review Labs Reviewed  GLUCOSE, CAPILLARY - Abnormal; Notable for the following:  Glucose-Capillary 60 (*)    All other components within normal limits   Imaging Review No results found.  EKG  Interpretation   None     Patient discussed with Dr. Conley Rolls who has agreed to admit the patient.    MDM  No diagnosis found. Patient brought in today by EMS from [facility] due to hypoglycemia.  CBG upon arrival in the ED was 60 after patient was given orange juice.  Patient given 1 AMP of D50, which brought the blood sugar up.  Labs show an ARF with a Creatine of 3.5.  Review of the chart shows that the patient's baseline appears to be around 1.4.  Staff at Nursing Home report that the patient has had decreased PO intake.  Patient is also on two different types of diuretics.  Suspect ARF is secondary to volume depletion.  Patient given IVF.  Patient admitted to [facility] for additional management of ARF.    Pascal Lux Pelham, PA-C 05/29/13 [phone]

## 2013-05-29 NOTE — ED Provider Notes (Signed)
Medical screening examination/treatment/procedure(s) were conducted as a shared visit with non-physician practitioner(s) and myself.  I personally evaluated the patient during the encounter  79 year old female presenting with hypoglycemia. Family reports decreased appetite. On exam, well-appearing, alert, normal heart sounds, regular rate and rhythm, lungs clear to auscultation bilaterally, abdomen soft and nontender. Lab workup demonstrates acute renal insufficiency, which likely explains her increased response to insulin.  Plan admission.  Clinical Impression: 1. Acute renal insufficiency   2. Hypoglycemia     Candyce Churn, MD 05/29/13 2328

## 2013-05-29 NOTE — ED Notes (Signed)
MD at bedside. 

## 2013-05-29 NOTE — ED Provider Notes (Signed)
Medical screening examination/treatment/procedure(s) were conducted as a shared visit with non-physician practitioner(s) and myself.  I personally evaluated the patient during the encounter.   Please see my separate note.     Candyce Churn, MD 05/29/13 [phone]

## 2013-05-29 NOTE — ED Notes (Signed)
Per EMS - Pt comes from [facility] on [address].  Staff called EMS because pt had CBG of 48 and was refusing to eat dinner.  Pt was persuaded to drink some orange juice.  EMS found CBG to be 91.  Staff at [facility] states pt is at baseline mentation.  Pt's insulin doses and regimen were recently changed.  Pt's vitals 124/68, HR 86, RR 16 and 100% on RA.

## 2013-05-29 NOTE — H&P (Signed)
[facility] History and Physical  Tatanisha Cuthbert Barnaby BJS:[id number] DOB: [birth year]    PCP:   Sandrea Hughs, MD   Chief Complaint: weakness, malaise, and hypoglycemia.  HPI: Allison Rodgers is an 79 y.o. female with hx of dementia, resident of assisted living, DM on insulin injection, obesity DJD, HTN, brought in by EMS from assisted living because her BS was in the 50's.  She was given an amp of D50, with good result.  Further evaluation showed she has ARF with Cr of 3.5, with baseline 1.4, and this was the reason for her admission.  Son stated that she has poor appetite and decrease oral intake.  Her medication list also included 2 diuretics (Lasix and Spironolactone), but no ACE-I.  She has no UTI symptoms.  Denied nausea, vomiting, abdominal pain, fever or chills. Her k was ok, and she has no leukocytosis.  Hospitalist was asked to admit her for ARF, volume depletion and low CBG.  Rewiew of Systems:  Constitutional: Negative for fever and chills. No significant weight loss or weight gain Eyes: Negative for eye pain, redness and discharge, diplopia, visual changes, or flashes of light. ENMT: Negative for ear pain, hoarseness, nasal congestion, sinus pressure and sore throat. No headaches; tinnitus, drooling, or problem swallowing. Cardiovascular: Negative for chest pain, palpitations, diaphoresis, dyspnea and peripheral edema. ; No orthopnea, PND Respiratory: Negative for cough, hemoptysis, wheezing and stridor. No pleuritic chestpain. Gastrointestinal: Negative for nausea, vomiting, diarrhea, constipation, abdominal pain, melena, blood in stool, hematemesis, jaundice and rectal bleeding.    Genitourinary: Negative for frequency, dysuria, incontinence,flank pain and hematuria; Musculoskeletal: Negative for back pain and neck pain. Negative for swelling and trauma.;  Skin: . Negative for pruritus, rash, abrasions, bruising and skin lesion.; ulcerations Neuro: Negative for headache,  lightheadedness and neck stiffness. Negative for weakness, altered level of consciousness , altered mental status, extremity weakness, burning feet, involuntary movement, seizure and syncope.  Psych: negative for anxiety, depression, insomnia, tearfulness, panic attacks, hallucinations, paranoia, suicidal or homicidal ideation    Past Medical History  Diagnosis Date  . GERD (gastroesophageal reflux disease)   . Chronic abdominal pain   . DJD (degenerative joint disease)   . Hypertensive cardiovascular disease   . Obesity   . Gastritis, chronic   . 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 Esophageal stricture   . Hiatal hernia   . Hx of adenomatous colonic polyps   . Diverticulosis   . Dyslipidemia   . Arthritis   . DM (diabetes mellitus)   . Hypertension   . Microcytic anemia   . Edema   . Renal disorder     Past Surgical History  Procedure Laterality Date  . Appendectomy    . Cholecystectomy  1999  . Vesicovaginal fistula closure w/ tah  1957  . Bilateral oophorectomy  1957  . Hernia repair  08/2005    umbilical and ventral hernia repair by Dr. Jamey Ripa  . Rotator cuff repair  7/05    right  . Back surgery      Medications:  HOME MEDS: Prior to Admission medications   Medication Sig Start Date End Date Taking? Authorizing Provider  aspirin 81 MG chewable tablet Chew 81 mg by mouth daily.     Yes Historical Provider, MD  Calcium Carbonate (CALTRATE 600 PO) Take 600 mg by mouth.   Yes Historical Provider, MD  chlorpheniramine (CHLOR-TRIMETON) 4 MG tablet Take 4 mg by mouth 2 (two) times daily  as needed (for nasal drip, drainage).   Yes Historical Provider, MD  dorzolamide (TRUSOPT) 2 % ophthalmic solution Place 1 drop into both eyes at bedtime.   Yes Historical Provider, MD  fluocinonide cream (LIDEX) 0.05 % Apply 1 application topically 2 (two) times daily as needed (rash).   Yes Historical Provider, MD  furosemide (LASIX) 40 MG tablet  Take 40 mg by mouth daily.   Yes Historical Provider, MD  insulin aspart (NOVOLOG) 100 UNIT/ML injection Inject 4 Units into the skin 3 (three) times daily with meals.   Yes Historical Provider, MD  insulin glargine (LANTUS) 100 UNIT/ML injection Inject 14 Units into the skin at bedtime. Uses flexpen to inject 18 units at 8 am and 14 units at 8 pm   Yes Historical Provider, MD  latanoprost (XALATAN) 0.005 % ophthalmic solution 1 drop at bedtime.   Yes Historical Provider, MD  oxyCODONE-acetaminophen (PERCOCET) 5-325 MG per tablet Take 1 tablet by mouth every 6 (six) hours as needed for pain.   Yes Historical Provider, MD  pantoprazole (PROTONIX) 40 MG tablet Take 1 tablet (40 mg total) by mouth 2 (two) times daily. 04/05/13  Yes Alison Murray, MD  spironolactone (ALDACTONE) 100 MG tablet Take 1 tablet (100 mg total) by mouth 2 (two) times daily. 04/05/13  Yes Alison Murray, MD  acetaminophen (TYLENOL) 500 MG tablet 500-1,000 mg every 6 (six) hours as needed for pain. Per bottle as needed for pain    Historical Provider, MD  ALPRAZolam (XANAX) 0.25 MG tablet Take 1 tablet (0.25 mg total) by mouth every 6 (six) hours as needed for anxiety. 04/05/13   Alison Murray, MD  guaiFENesin-dextromethorphan (ROBITUSSIN DM) 100-10 MG/5ML syrup Take 5 mLs by mouth 3 (three) times daily as needed for cough.    Historical Provider, MD  sodium chloride (OCEAN) 0.65 % nasal spray Place 2 sprays into the nose as needed for congestion.    Historical Provider, MD     Allergies:  Allergies  Allergen Reactions  . Penicillins Other (See Comments)    REACTION: causes hallucinations  . Sulfamethoxazole-Trimethoprim Swelling    REACTION: rash  . Bactrim     Unknown per mar  . Trimethoprim     Unknown per mar     Social History:   reports that she has never smoked. She has never used smokeless tobacco. She reports that she does not drink alcohol or use illicit drugs.  Family History: Family History  Problem  Relation Age of Onset  . Stroke Mother   . Cancer Father   . Hypertension Other      Physical Exam: Filed Vitals:   05/29/13 1829 05/29/13 2206  BP: 142/69 129/61  Pulse: 87 78  Temp: 97.7 F (36.5 C)   TempSrc: Oral   Resp: 18 14  SpO2: 98% 100%   Blood pressure 129/61, pulse 78, temperature 97.7 F (36.5 C), temperature source Oral, resp. rate 14, SpO2 100.00%.  GEN:  Pleasant  patient lying in the stretcher in no acute distress; cooperative with exam. PSYCH:  alert and oriented x4; does not appear anxious or depressed; affect is appropriate. HEENT: Mucous membranes pink and anicteric; PERRLA; EOM intact; no cervical lymphadenopathy nor thyromegaly or carotid bruit; no JVD; There were no stridor. Neck is very supple. Breasts:: Not examined CHEST WALL: No tenderness CHEST: Normal respiration, clear to auscultation bilaterally.  HEART: Regular rate and rhythm.  There are no murmur, rub, or gallops.   BACK: No kyphosis or  scoliosis; no CVA tenderness ABDOMEN: soft and non-tender; no masses, no organomegaly, normal abdominal bowel sounds; no pannus; no intertriginous candida. There is no rebound and no distention. Rectal Exam: Not done EXTREMITIES: No bone or joint deformity; age-appropriate arthropathy of the hands and knees; no edema; no ulcerations.  There is no calf tenderness. Genitalia: not examined PULSES: 2+ and symmetric SKIN: Normal hydration no rash or ulceration CNS: Cranial nerves 2-12 grossly intact no focal lateralizing neurologic deficit.  Speech is fluent; uvula elevated with phonation, facial symmetry and tongue midline. DTR are normal bilaterally, cerebella exam is intact, barbinski is negative and strengths are equaled bilaterally.  No sensory loss.   Labs on Admission:  Basic Metabolic Panel:  Recent Labs Lab 05/29/13 2000  NA 124*  K 4.8  CL 86*  CO2 25  GLUCOSE 247*  BUN 75*  CREATININE 3.50*  CALCIUM 10.8*   Liver Function Tests:  Recent  Labs Lab 05/29/13 2000  AST 30  ALT 18  ALKPHOS 66  BILITOT 0.5  PROT 7.4  ALBUMIN 3.5   No results found for this basename: LIPASE, AMYLASE,  in the last 168 hours No results found for this basename: AMMONIA,  in the last 168 hours CBC:  Recent Labs Lab 05/29/13 1907  WBC 9.7  NEUTROABS 6.0  HGB 14.0  HCT 42.2  MCV 75.2*  PLT 237   Cardiac Enzymes: No results found for this basename: CKTOTAL, CKMB, CKMBINDEX, TROPONINI,  in the last 168 hours  CBG:  Recent Labs Lab 05/29/13 1827 05/29/13 1953  GLUCAP 60* 242*     Radiological Exams on Admission: No results found.  Assessment/Plan Present on Admission:  . Acute renal failure (ARF) . HYPERTENSION . Type II or unspecified type diabetes mellitus with neurological manifestations, uncontrolled(250.62) . CKD (chronic kidney disease), stage III . Volume depletion . Dementia  PLAN:  I suspect her ARF is from volume depletion with decrease appetite on 2 diuretics.  Will give her IVF, and follow Cr.  I have stopped both diuretics for now.  For her low CBG, it is likely from decrease renal fx as well.  Will give her some D5NS, and follow closely.  She is stable, full code, and will be admitted to [facility] service.  I updated her son of her condition, and my care plan.  If her renal Fx tests do not improve, then further work up will be initiated.  Thank you for allowing me to participate in her care.  Other plans as per orders.  Code Status: FULL Unk Lightning, MD. [facility] Pager [phone] 7pm to 7am.  05/29/2013, 10:32 PM

## 2013-05-29 NOTE — ED Notes (Signed)
Bed: WA09 Expected date: 05/29/13 Expected time: 6:18 PM Means of arrival: Ambulance Comments: Hypoglycemia/Treated

## 2013-05-30 DIAGNOSIS — G9340 Encephalopathy, unspecified: Secondary | ICD-10-CM

## 2013-05-30 LAB — GLUCOSE, CAPILLARY
Glucose-Capillary: 170 mg/dL — ABNORMAL HIGH (ref 70–99)
Glucose-Capillary: 130 mg/dL — ABNORMAL HIGH (ref 70–99)
Glucose-Capillary: 142 mg/dL — ABNORMAL HIGH (ref 70–99)
Glucose-Capillary: 157 mg/dL — ABNORMAL HIGH (ref 70–99)

## 2013-05-30 LAB — BASIC METABOLIC PANEL
BUN: 65 mg/dL — ABNORMAL HIGH (ref 6–23)
Creatinine, Ser: 3.13 mg/dL — ABNORMAL HIGH (ref 0.50–1.10)
GFR calc non Af Amer: 12 mL/min — ABNORMAL LOW (ref >90)
Glucose, Bld: 165 mg/dL — ABNORMAL HIGH (ref 70–99)
Potassium: 4.8 mEq/L (ref 3.5–5.1)

## 2013-05-30 LAB — HEMOGLOBIN A1C
Hgb A1c MFr Bld: 6.2 % — ABNORMAL HIGH (ref <5.7)
Mean Plasma Glucose: 131 mg/dL — ABNORMAL HIGH (ref <117)

## 2013-05-30 LAB — SURGICAL PCR SCREEN: Staphylococcus aureus: NEGATIVE

## 2013-05-30 NOTE — Progress Notes (Signed)
Pt all of a sudden c/o " feeling hot, stated that her belly was hurting, and that she felt bad." VS and CBG was taken, and were all WNL.  Family noticed that pt started c/o abd pain after she started eating.  Pt's son informed me that she had been treated for diverticulits about 3 weeks ago.  Paged and texted MD on call.  No call back. Will continue to monitor pt.

## 2013-05-30 NOTE — Progress Notes (Signed)
Patient ID: Allison Rodgers, female   DOB: Dec 24, 1923, 79 y.o.   MRN: [id number] [facility] PROGRESS NOTE  Allison Rodgers WUJ:[id number] DOB: 11-29-1923 DOA: 05/29/2013 PCP: Sandrea Hughs, MD  Brief narrative: 79 y.o. female with hx of dementia, resident of assisted living, DM on insulin injection, obesity, DJD, HTN, brought in by EMS from assisted living because her BS was in the 50's. She was given an amp of D50, with good result. Further evaluation showed she has ARF with Cr of 3.5, with baseline 1.4, and this was the reason for her admission. Son stated that she has poor appetite and decrease oral intake. Her medication list also included 2 diuretics (Lasix and Spironolactone), but no ACE-I. She has no UTI symptoms. Denied nausea, vomiting, abdominal pain, fever or chills. Her k was WNL, and she has no leukocytosis. Hospitalist was asked to admit her for ARF, volume depletion and low CBG.   Principal Problem:   Volume depletion - evident by hypercalcemia, acute renal failure, hyponatremia and physical exam findings - pt is progressing well and feels better this AM - creatinine trending down, sodium trending up and calcium is within normal limits this AM - will continue IVF, swallow evaluation ordered - encouraged PO intake, continue to hold Lasix and Spironolactone  - BMP in AM Active Problems:   Dementia - appears to be stable and at pt's baseline this AM   Hyponatremia - secondary to volume status, pre renal etiology - trending up on IVF - continue IVF as noted above and repeat BMP in AM   Acute renal failure (ARF) - secondary to pre renal etiology - creatinine is trending down, continue IVF and repeat BMP in AM   HYPERTENSION - reasonable inpatient control    CKD (chronic kidney disease), stage III - creatinine in September 2014 was 2.3 but her baseline apparently remains at 1.5 - management as noted above and repeat BMP in AM   Type II or unspecified type diabetes mellitus  with neurological manifestations, uncontrolled - check A1C, continue insulin and SSI    GERD - continue PPI   Consultants:  None  Procedures/Studies:  None  Antibiotics:  None  Code Status: Full Family Communication: Pt at bedside Disposition Plan: Home when medically stable  HPI/Subjective: No events overnight.   Objective: Filed Vitals:   05/29/13 1829 05/29/13 2206 05/29/13 2303 05/30/13 0607  BP: 142/69 129/61 132/69 110/40  Pulse: 87 78 73 82  Temp: 97.7 F (36.5 C)  97.7 F (36.5 C) 97.4 F (36.3 C)  TempSrc: Oral  Oral Oral  Resp: 18 14 16 22   Height:   5' (1.524 m)   Weight:   76.204 kg (168 lb)   SpO2: 98% 100% 98% 98%    Intake/Output Summary (Last 24 hours) at 05/30/13 1345 Last data filed at 05/30/13 0910  Gross per 24 hour  Intake    755 ml  Output    500 ml  Net    255 ml    Exam:   General:  Pt is alert, follows commands appropriately, not in acute distress, oriented to name only   Cardiovascular: Regular rate and rhythm, S1/S2, no murmurs, no rubs, no gallops  Respiratory: Clear to auscultation bilaterally, no wheezing, diminished breath sounds at bases   Abdomen: Soft, non tender, non distended, bowel sounds present, no guarding  Extremities: No edema, pulses DP and PT palpable bilaterally  Neuro: Grossly nonfocal  Data Reviewed: Basic Metabolic Panel:  Recent Labs Lab 05/29/13  2000 05/30/13 0425  NA 124* 127*  K 4.8 4.8  CL 86* 94*  CO2 25 20  GLUCOSE 247* 165*  BUN 75* 65*  CREATININE 3.50* 3.13*  CALCIUM 10.8* 9.9   Liver Function Tests:  Recent Labs Lab 05/29/13 2000  AST 30  ALT 18  ALKPHOS 66  BILITOT 0.5  PROT 7.4  ALBUMIN 3.5   CBC:  Recent Labs Lab 05/29/13 1907  WBC 9.7  NEUTROABS 6.0  HGB 14.0  HCT 42.2  MCV 75.2*  PLT 237   CBG:  Recent Labs Lab 05/29/13 1827 05/29/13 1953 05/29/13 2315 05/30/13 0731 05/30/13 1211  GLUCAP 60* 242* 143* 170* 284*    Recent Results (from the  past 240 hour(s))  SURGICAL PCR SCREEN     Status: None   Collection Time    05/29/13 11:36 PM      Result Value Range Status   MRSA, PCR NEGATIVE  NEGATIVE Final   Staphylococcus aureus NEGATIVE  NEGATIVE Final   Comment:            The Xpert SA Assay (FDA     approved for NASAL specimens     in patients over 40 years of age),     is one component of     a comprehensive surveillance     program.  Test performance has     been validated by [employer] for patients greater     than or equal to 31 year old.     It is not intended     to diagnose infection nor to     guide or monitor treatment.     Scheduled Meds: . aspirin  81 mg Oral Daily  . dorzolamide  1 drop Both Eyes QHS  . heparin  5,000 Units Subcutaneous Q8H  . insulin aspart  0-5 Units Subcutaneous QHS  . insulin aspart  0-9 Units Subcutaneous TID WC  . insulin aspart  4 Units Subcutaneous TID WC  . insulin glargine  14 Units Subcutaneous Daily  . pantoprazole  40 mg Oral BID   Continuous Infusions: . dextrose 5 % and 0.9% NaCl 100 mL/hr at 05/29/13 2339   Debbora Presto, MD  [facility] Pager [phone]  If 7PM-7AM, please contact night-coverage [email or web address] Password [email or web address] 05/30/2013, 1:45 PM   LOS: 1 day

## 2013-05-30 NOTE — Progress Notes (Signed)
Pt ordered soup and jello to eat since it would be easier on her stomach. Pt states that she feels better now. Resting quietly.   Will continue to monitor.

## 2013-05-31 LAB — BASIC METABOLIC PANEL
CO2: 22 mEq/L (ref 19–32)
Calcium: 9.3 mg/dL (ref 8.4–10.5)
GFR calc Af Amer: 23 mL/min — ABNORMAL LOW (ref >90)
GFR calc non Af Amer: 20 mL/min — ABNORMAL LOW (ref >90)
Sodium: 131 mEq/L — ABNORMAL LOW (ref 135–145)

## 2013-05-31 LAB — CBC
HCT: 35 % — ABNORMAL LOW (ref 36.0–46.0)
Hemoglobin: 11.1 g/dL — ABNORMAL LOW (ref 12.0–15.0)
MCH: 24.3 pg — ABNORMAL LOW (ref 26.0–34.0)
MCHC: 31.7 g/dL (ref 30.0–36.0)
MCV: 76.8 fL — ABNORMAL LOW (ref 78.0–100.0)
Platelets: 153 10*3/uL (ref 150–400)
RBC: 4.56 MIL/uL (ref 3.87–5.11)
RDW: 13.4 % (ref 11.5–15.5)
WBC: 5.3 10*3/uL (ref 4.0–10.5)

## 2013-05-31 MED ORDER — INSULIN GLARGINE 100 UNIT/ML ~~LOC~~ SOLN
7.0000 [IU] | Freq: Every day | SUBCUTANEOUS | Status: DC
  Administered 2013-06-01: 7 [IU] via SUBCUTANEOUS
  Filled 2013-05-31: qty 0.07

## 2013-05-31 NOTE — Progress Notes (Signed)
Patient ID: Allison Rodgers, female   DOB: 1924/03/25, 79 y.o.   MRN: [id number]  [facility] PROGRESS NOTE  Leann Mayweather Bocek WUJ:[id number] DOB: Apr 23, 1924 DOA: 05/29/2013 PCP: Sandrea Hughs, MD  Brief narrative:  79 y.o. female with hx of dementia, resident of assisted living, DM on insulin injection, obesity, DJD, HTN, brought in by EMS from assisted living because her BS was in the 50's. She was given an amp of D50, with good result. Further evaluation showed she has ARF with Cr of 3.5, with baseline 1.4, and this was the reason for her admission. Son stated that she has poor appetite and decrease oral intake. Her medication list also included 2 diuretics (Lasix and Spironolactone), but no ACE-I. She has no UTI symptoms. Denied nausea, vomiting, abdominal pain, fever or chills. Her k was WNL, and she has no leukocytosis. Hospitalist was asked to admit her for ARF, volume depletion and low CBG.   Principal Problem:  Volume depletion  - evident by hypercalcemia, acute renal failure, hyponatremia and physical exam findings  - pt is progressing well and feels better this AM  - creatinine trending down, sodium trending up and calcium is within normal limits this AM  - will continue IVF - encouraged PO intake, continue to hold Lasix and Spironolactone  - BMP in AM  Active Problems:  Dementia  - appears to be stable and at pt's baseline this AM  - plan on MMSE in AM Hyponatremia  - secondary to volume status, pre renal etiology  - trending up on IVF  - continue IVF as noted above and repeat BMP in AM  Acute renal failure (ARF)  - secondary to pre renal etiology  - creatinine is trending down, continue IVF and repeat BMP in AM  HYPERTENSION  - reasonable inpatient control  CKD (chronic kidney disease), stage III  - creatinine in September 2014 was 2.3 but her baseline apparently remains at 1.5  - management as noted above and repeat BMP in AM  Type II or unspecified type diabetes  mellitus with neurological manifestations, uncontrolled  - A1C 6.2, will discuss with PCP is we can stop insulin as pt has hypoglycemic episodes   GERD  - continue PPI  Consultants:  None Procedures/Studies:  None Antibiotics:  None  Code Status: Full  Family Communication: Pt and son at bedside  Disposition Plan: Home when medically stable   HPI/Subjective: No events overnight.   Objective: Filed Vitals:   05/30/13 1347 05/30/13 1723 05/30/13 2159 05/31/13 0610  BP: 100/65 98/45 98/57  119/76  Pulse: 97 69 60 65  Temp: 97.7 F (36.5 C) 98.8 F (37.1 C) 97.7 F (36.5 C) 98.1 F (36.7 C)  TempSrc: Oral Oral Oral Oral  Resp: 18 18 16 18   Height:      Weight:      SpO2: 99% 97% 98% 100%    Intake/Output Summary (Last 24 hours) at 05/31/13 0953 Last data filed at 05/31/13 0600  Gross per 24 hour  Intake   4040 ml  Output   1225 ml  Net   2815 ml    Exam:   General:  Pt is alert, follows commands appropriately, not in acute distress  Cardiovascular: Regular rate and rhythm, S1/S2, no murmurs, no rubs, no gallops  Respiratory: Clear to auscultation bilaterally, no wheezing, no crackles, no rhonchi  Abdomen: Soft, non tender, non distended, bowel sounds present, no guarding  Extremities: No edema, pulses DP and PT palpable bilaterally  Neuro: Grossly  nonfocal  Data Reviewed: Basic Metabolic Panel:  Recent Labs Lab 05/29/13 2000 05/30/13 0425 05/31/13 0515  NA 124* 127* 131*  K 4.8 4.8 4.2  CL 86* 94* 101  CO2 25 20 22   GLUCOSE 247* 165* 193*  BUN 75* 65* 44*  CREATININE 3.50* 3.13* 2.13*  CALCIUM 10.8* 9.9 9.3   Liver Function Tests:  Recent Labs Lab 05/29/13 2000  AST 30  ALT 18  ALKPHOS 66  BILITOT 0.5  PROT 7.4  ALBUMIN 3.5   CBC:  Recent Labs Lab 05/29/13 1907 05/31/13 0515  WBC 9.7 5.3  NEUTROABS 6.0  --   HGB 14.0 11.1*  HCT 42.2 35.0*  MCV 75.2* 76.8*  PLT 237 153   CBG:  Recent Labs Lab 05/30/13 1211  05/30/13 1656 05/30/13 1734 05/30/13 2150 05/31/13 0741  GLUCAP 284* 130* 142* 157* 173*    Recent Results (from the past 240 hour(s))  SURGICAL PCR SCREEN     Status: None   Collection Time    05/29/13 11:36 PM      Result Value Range Status   MRSA, PCR NEGATIVE  NEGATIVE Final   Staphylococcus aureus NEGATIVE  NEGATIVE Final   Comment:            The Xpert SA Assay (FDA     approved for NASAL specimens     in patients over 87 years of age),     is one component of     a comprehensive surveillance     program.  Test performance has     been validated by [employer] for patients greater     than or equal to 35 year old.     It is not intended     to diagnose infection nor to     guide or monitor treatment.     Scheduled Meds: . aspirin  81 mg Oral Daily  . dorzolamide  1 drop Both Eyes QHS  . heparin  5,000 Units Subcutaneous Q8H  . insulin aspart  0-5 Units Subcutaneous QHS  . insulin aspart  0-9 Units Subcutaneous TID WC  . insulin aspart  4 Units Subcutaneous TID WC  . insulin glargine  14 Units Subcutaneous Daily  . latanoprost  1 drop Both Eyes QHS  . pantoprazole  40 mg Oral BID   Continuous Infusions: . dextrose 5 % and 0.9% NaCl 100 mL/hr at 05/31/13 0831   Debbora Presto, MD  [facility] Pager [phone]  If 7PM-7AM, please contact night-coverage [email or web address] Password [email or web address] 05/31/2013, 9:53 AM   LOS: 2 days

## 2013-05-31 NOTE — Evaluation (Signed)
Occupational Therapy Evaluation Patient Details Name: Allison Rodgers MRN: [id number] DOB: 03/18/1924 Today's Date: 05/31/2013 Time: 1478-2956 OT Time Calculation (min): 25 min  OT Assessment / Plan / Recommendation History of present illness 79 y.o. female with hx of dementia, resident of assisted living, DM on insulin injection, obesity, DJD, HTN, brought in by EMS from assisted living because her BS was in the 50's. She was given an amp of D50, with good result. Further evaluation showed she has ARF with Cr of 3.5, with baseline 1.4, and this was the reason for her admission. Son stated that she has poor appetite and decrease oral intake. Her medication list also included 2 diuretics (Lasix and Spironolactone), but no ACE-I. She has no UTI symptoms. Denied nausea, vomiting, abdominal pain, fever or chills. Her k was WNL, and she has no leukocytosis. Hospitalist was asked to admit her for ARF, volume depletion and low CBG   Clinical Impression   Pt presents to OT with decreased I with ADL activity due to problems listed below. Pt will benefit from skilled OT to increase I with ADL activity and return to PLOF    OT Assessment  Patient needs continued OT Services    Follow Up Recommendations  Home health OT       Equipment Recommendations  None recommended by OT       Frequency  Min 2X/week    Precautions / Restrictions Precautions Precautions: Fall       ADL  Grooming: Minimal assistance Where Assessed - Grooming: Supported standing Upper Body Bathing: Min guard Where Assessed - Upper Body Bathing: Unsupported sitting Lower Body Bathing: Maximal assistance Where Assessed - Lower Body Bathing: Supported sit to stand Upper Body Dressing: Min guard Where Assessed - Upper Body Dressing: Unsupported sitting Lower Body Dressing: Maximal assistance Where Assessed - Lower Body Dressing: Supported sit to Pharmacist, hospital: Moderate assistance Toilet Transfer Method: Sit to  stand Toilet Transfer Equipment: Comfort height toilet Where Assessed - Toileting Clothing Manipulation and Hygiene: Standing ADL Comments: Pt needed mod A from toilet and min A from recliner with sit to stand. VC needed for hand placement and safety    OT Diagnosis: Generalized weakness  OT Problem List: Decreased strength;Decreased activity tolerance OT Treatment Interventions: Self-care/ADL training;Patient/family education;DME and/or AE instruction;Therapeutic activities   OT Goals(Current goals can be found in the care plan section) ADL Goals Pt Will Perform Grooming: with supervision;standing Pt Will Perform Lower Body Dressing: with min guard assist;sit to/from stand Pt Will Transfer to Toilet: with supervision;ambulating;regular height toilet Pt Will Perform Toileting - Clothing Manipulation and hygiene: with supervision;sit to/from stand  Visit Information  Last OT Received On: 05/31/13 Assistance Needed: +1 PT/OT Co-Evaluation/Treatment: Yes History of Present Illness: 79 y.o. female with hx of dementia, resident of assisted living, DM on insulin injection, obesity, DJD, HTN, brought in by EMS from assisted living because her BS was in the 50's. She was given an amp of D50, with good result. Further evaluation showed she has ARF with Cr of 3.5, with baseline 1.4, and this was the reason for her admission. Son stated that she has poor appetite and decrease oral intake. Her medication list also included 2 diuretics (Lasix and Spironolactone), but no ACE-I. She has no UTI symptoms. Denied nausea, vomiting, abdominal pain, fever or chills. Her k was WNL, and she has no leukocytosis. Hospitalist was asked to admit her for ARF, volume depletion and low CBG       Prior Functioning  Home Living Family/patient expects to be discharged to:: Assisted living Home Equipment: Gilmer Mor - single point;Walker - 2 wheels Prior Function Comments: Pt new to ALF- [facility]  [employer] Communication: No difficulties         Vision/Perception Vision - History Patient Visual Report: No change from baseline   Cognition  Cognition Arousal/Alertness: Awake/alert Behavior During Therapy: WFL for tasks assessed/performed Overall Cognitive Status: Within Functional Limits for tasks assessed    Extremity/Trunk Assessment Upper Extremity Assessment Upper Extremity Assessment: Generalized weakness     Mobility Bed Mobility Bed Mobility: Not assessed Details for Bed Mobility Assistance: pt in chair Transfers Transfers: Sit to Stand;Stand to Sit Sit to Stand: 3: Mod assist;From toilet;From chair/3-in-1;With upper extremity assist Stand to Sit: To toilet;To chair/3-in-1;With armrests;With upper extremity assist           End of Session OT - End of Session Activity Tolerance: Patient tolerated treatment well Patient left: in chair;with call bell/phone within reach Nurse Communication: Mobility status  GO     Allison Rodgers 05/31/2013, 2:12 PM

## 2013-05-31 NOTE — Progress Notes (Signed)
Clinical Social Work Department BRIEF PSYCHOSOCIAL ASSESSMENT 05/31/2013  Patient:  Allison Rodgers, Allison Rodgers     Account Number:  [id number]     Admit date:  05/29/2013  Clinical Social Worker:  Dennison Bulla  Date/Time:  05/31/2013 10:30 AM  Referred by:  Physician  Date Referred:  05/31/2013 Referred for  ALF Placement   Other Referral:   Interview type:  Patient Other interview type:    PSYCHOSOCIAL DATA Living Status:  FACILITY Admitted from facility:  [facility] Level of care:  Assisted Living Primary support name:  Janann August Primary support relationship to patient:  CHILD, ADULT Degree of support available:   Strong    CURRENT CONCERNS Current Concerns  Post-Acute Placement   Other Concerns:    SOCIAL WORK ASSESSMENT / PLAN CSW received referral due to patient being admitted from a facility. CSW reviewed chart and met with patient and son at bedside. CSW introduced myself and explained role.    Patient went to SNF but recently moved into ALF in September 2014. Son reports that care at ALF is great but that patient is still transitioning into living at ALF. Patient and son agreeable for patient to return to ALF. CSW explained process and agreeable to continue to follow.    CSW contacted [facility] and spoke with Lane Hacker who reports patient was fairly independent and ambulated well at ALF. CSW spoke with [facility] who reports that patient can return when medically stable.    FL2 completed and placed on chart for MD signature. CSW will continue to follow.   Assessment/plan status:  Psychosocial Support/Ongoing Assessment of Needs Other assessment/ plan:   Information/referral to community resources:   Will return to ALF    PATIENT'S/FAMILY'S RESPONSE TO PLAN OF CARE: Patient alert and engaged in assessment. Son participated in assessment and feels patient is doing well at ALF. Son thanked CSW for time and has contact information if needed.       Unk Lightning,  LCSW (Coverage for [employer])

## 2013-05-31 NOTE — Telephone Encounter (Signed)
Rest of the forms filled out and signed by TP.  Called spoke with Janann August and apologized for both the delay and inconvenience.  Copy of forms made for scan and original left up front for Ulysses to pick up at his convenience.  Nothing further needed at this time; will sign off.

## 2013-05-31 NOTE — Evaluation (Signed)
Clinical/Bedside Swallow Evaluation Patient Details  Name: Allison Rodgers MRN: [id number] Date of Birth: Aug 16, 1923  Today's Date: 05/31/2013 Time: 1130-1230 SLP Time Calculation (min): 60 min  Past Medical History:  Past Medical History  Diagnosis Date  . GERD (gastroesophageal reflux disease)   . Chronic abdominal pain   . DJD (degenerative joint disease)   . Hypertensive cardiovascular disease   . Obesity   . Gastritis, chronic   . 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 Esophageal stricture   . Hiatal hernia   . Hx of adenomatous colonic polyps   . Diverticulosis   . Dyslipidemia   . Arthritis   . DM (diabetes mellitus)   . Hypertension   . Microcytic anemia   . Edema   . Renal disorder    Past Surgical History:  Past Surgical History  Procedure Laterality Date  . Appendectomy    . Cholecystectomy  1999  . Vesicovaginal fistula closure w/ tah  1957  . Bilateral oophorectomy  1957  . Hernia repair  08/2005    umbilical and ventral hernia repair by Dr. Jamey Ripa  . Rotator cuff repair  7/05    right  . Back surgery     HPI:  79 y.o. female with hx of dementia, resident of assisted living, DM on insulin injection, obesity, DJD, HTN, brought in by EMS from assisted living because her Blood sugar was in the 50's. She was given an amp of D50, with good result per chart review. Pt found to have ARF with Cr of 3.5, with baseline 1.4, and this was the reason for her admission. Son stated that she has poor appetite and decrease oral intake. Her medication list also included 2 diuretics (Lasix and Spironolactone), but no ACE-I. She has no UTI symptoms. Denied nausea, vomiting, abdominal pain, fever or chills. Her k was WNL, and she has no leukocytosis.  Pt also with h/o hiatal hernia, esophageal stricture.  Pt has been seen by Dr [name] who is her primary per pt and son.   Pt noted to be on a PPI.     Assessment / Plan / Recommendation Clinical  Impression  Pt presents with clinical symptoms of dysphagia consistent with esophageal issues (c/o regurgitation, gagging, reflux intermittently with po).  No focal CN deficits except pt reporting extra sensitivity on right side of face.   She also reported pain in her right neck moving to esophagus for which she received medication but unable to state type or if helpful.  Pt able to self feed - consumed approx 1/2 of her cream soup, 2 crackers, and 6 ounces thin liquid.   Cough x1 after intake of approx 1/2 container of soup and pt pointed to distal esophagus to indicate area of discomfort.  Provided compensation strategies to mitigate reported dysphagia/xerostomia symptoms.  As son reports pt with dysphagia x YEARS, advised pt to inform GI MD to dysphagia symptoms during next visit.   SLP to sign off please reorder if desire.  Son and pt reported gratitude for information.      Aspiration Risk  Mild    Diet Recommendation Regular;Thin liquid   Liquid Administration via: Cup;Straw Medication Administration: Crushed with puree (or whole with pudding, follow with liquids) Supervision: Intermittent supervision to cue for compensatory strategies;Patient able to self feed Compensations: Slow rate;Small sips/bites (start meals with liquids) Postural Changes and/or Swallow Maneuvers: Seated upright 90 degrees;Upright 30-60 min after meal    Other  Recommendations Oral Care Recommendations: Oral care BID   Follow Up Recommendations       Frequency and Duration        Pertinent Vitals/Pain Afebrile, decreased    SLP Swallow Goals     Swallow Study Prior Functional Status   poor intake with significant weight loss since January 2014    General Date of Onset: 05/31/13 HPI: 79 y.o. female with hx of dementia, resident of assisted living, DM on insulin injection, obesity, DJD, HTN, brought in by EMS from assisted living because her Blood sugar was in the 50's. She was given an amp of D50, with  good result per chart review. Pt found to have ARF with Cr of 3.5, with baseline 1.4, and this was the reason for her admission. Son stated that she has poor appetite and decrease oral intake. Her medication list also included 2 diuretics (Lasix and Spironolactone), but no ACE-I. She has no UTI symptoms. Denied nausea, vomiting, abdominal pain, fever or chills. Her k was WNL, and she has no leukocytosis.  Pt also with h/o hiatal hernia, esophageal stricture.  Pt has been seen by Dr [name] who is her primary per pt and son.   Pt noted to be on a PPI.   Type of Study: Bedside swallow evaluation Previous Swallow Assessment: Per son pt seen by SLP at bedside for clinical swallow evaluation, discussion took place re: possible FEES study that was not completed Diet Prior to this Study: Regular;Thin liquids Temperature Spikes Noted: No Respiratory Status: Room air Behavior/Cognition: Alert;Cooperative;Pleasant mood (disoriented to city, oriented to name) Oral Cavity - Dentition: Adequate natural dentition Self-Feeding Abilities: Able to feed self Patient Positioning: Upright in bed Baseline Vocal Quality: Clear Volitional Cough: Strong Volitional Swallow: Able to elicit    Oral/Motor/Sensory Function Overall Oral Motor/Sensory Function:  (pt reports facial sensation increased on right, more sensitive, o/w no focal deficit)   Ice Chips Ice chips: Not tested   Thin Liquid Thin Liquid: Impaired Presentation: Self Fed;Straw Oral Phase Impairments: Impaired anterior to posterior transit Oral Phase Functional Implications: Prolonged oral transit (mild slow oral transit) Pharyngeal  Phase Impairments: Cough - Delayed Other Comments: cough delayed x2 of approx 15 boluses    Nectar Thick Nectar Thick Liquid: Not tested   Honey Thick Honey Thick Liquid: Not tested   Puree Puree: Not tested   Solid   GO    Solid: Within functional limits Presentation: Self Fed Other Comments: slow mastication and oral  transfer, no s/s of aspiration       Donavan Burnet, MS [facility] SLP [phone]

## 2013-05-31 NOTE — Evaluation (Signed)
Physical Therapy Evaluation Patient Details Name: Allison Rodgers MRN: [id number] DOB: 06-Jul-1924 Today's Date: 05/31/2013 Time: 1478-2956 PT Time Calculation (min): 27 min  PT Assessment / Plan / Recommendation History of Present Illness  79 yo female admitted from ALF with acute renal failure and hypoglycemia. Hx of dementia, DM, HTN, DJD, falls.   Clinical Impression  On eval, pt required Min assist for mobility-able to ambulate ~100 feet with RW. Demonstrates general weakness and decreased activity tolerance. Recommend HHPt at ALF.     PT Assessment  Patient needs continued PT services    Follow Up Recommendations  Supervision/Assistance - 24 hour;Home health PT (ALF)    Does the patient have the potential to tolerate intense rehabilitation      Barriers to Discharge        Equipment Recommendations  None recommended by PT    Recommendations for Other Services OT consult   Frequency Min 3X/week    Precautions / Restrictions Precautions Precautions: Fall Restrictions Weight Bearing Restrictions: No   Pertinent Vitals/Pain No c/o pain      Mobility  Bed Mobility Bed Mobility: Not assessed Details for Bed Mobility Assistance: pt in chair Transfers Transfers: Sit to Stand;Stand to Sit Sit to Stand: 4: Min assist;From chair/3-in-1;With armrests Stand to Sit: 4: Min assist;To chair/3-in-1;With armrests Details for Transfer Assistance: VCs safety, technique, hand placement. Assist to rise, stabilize, control descent Ambulation/Gait Ambulation/Gait Assistance: 4: Min assist Ambulation Distance (Feet): 100 Feet Assistive device: Rolling walker Ambulation/Gait Assistance Details: Assist to stabilize throughout ambulation. slow gait speed. VCS safety, distance from walker.  Gait Pattern: Step-through pattern;Trunk flexed;Decreased stride length    Exercises     PT Diagnosis: Difficulty walking;Generalized weakness  PT Problem List: Decreased strength;Decreased  activity tolerance;Decreased mobility;Decreased balance;Decreased knowledge of use of DME;Decreased cognition PT Treatment Interventions: DME instruction;Gait training;Functional mobility training;Therapeutic activities;Therapeutic exercise;Patient/family education;Balance training     PT Goals(Current goals can be found in the care plan section) Acute Rehab PT Goals Patient Stated Goal: none stated PT Goal Formulation: Patient unable to participate in goal setting Time For Goal Achievement: 06/14/13 Potential to Achieve Goals: Fair  Visit Information  Last PT Received On: 05/31/13 Assistance Needed: +1 PT/OT Co-Evaluation/Treatment: Yes History of Present Illness: 79 yo female admitted from ALF with acute renal failure and hypoglycemia. Hx of dementia, DM, HTN, DJD, falls.        Prior Functioning  Home Living Family/patient expects to be discharged to:: Assisted living Home Equipment: Dan Humphreys - 2 wheels;Cane - single point Prior Function Level of Independence: Independent with assistive device(s) Comments: Pt new to ALF- [facility] [employer] Communication Communication: No difficulties    Cognition  Cognition Arousal/Alertness: Awake/alert Behavior During Therapy: WFL for tasks assessed/performed Overall Cognitive Status: Within Functional Limits for tasks assessed    Extremity/Trunk Assessment Upper Extremity Assessment Upper Extremity Assessment: Generalized weakness Lower Extremity Assessment Lower Extremity Assessment: Generalized weakness Cervical / Trunk Assessment Cervical / Trunk Assessment: Normal   Balance    End of Session PT - End of Session Activity Tolerance: Patient tolerated treatment well Patient left: in chair;with call bell/phone within reach  GP     Rebeca Alert, MPT Pager: [phone]

## 2013-06-01 LAB — CBC
HCT: 36.2 % (ref 36.0–46.0)
MCH: 24.5 pg — ABNORMAL LOW (ref 26.0–34.0)
MCHC: 31.8 g/dL (ref 30.0–36.0)
MCV: 77.2 fL — ABNORMAL LOW (ref 78.0–100.0)
RDW: 13.5 % (ref 11.5–15.5)

## 2013-06-01 LAB — BASIC METABOLIC PANEL
BUN: 29 mg/dL — ABNORMAL HIGH (ref 6–23)
Chloride: 106 mEq/L (ref 96–112)
Creatinine, Ser: 1.75 mg/dL — ABNORMAL HIGH (ref 0.50–1.10)
GFR calc Af Amer: 29 mL/min — ABNORMAL LOW (ref >90)
GFR calc non Af Amer: 25 mL/min — ABNORMAL LOW (ref >90)

## 2013-06-01 LAB — GLUCOSE, CAPILLARY: Glucose-Capillary: 168 mg/dL — ABNORMAL HIGH (ref 70–99)

## 2013-06-01 MED ORDER — OXYCODONE-ACETAMINOPHEN 5-325 MG PO TABS: 1.0000 | ORAL_TABLET | Freq: Four times a day (QID) | ORAL | Status: DC | PRN

## 2013-06-01 MED ORDER — FUROSEMIDE 20 MG PO TABS: 20.0000 mg | ORAL_TABLET | Freq: Every day | ORAL | Status: DC

## 2013-06-01 MED ORDER — SITAGLIPTIN PHOSPHATE 25 MG PO TABS: 25.0000 mg | ORAL_TABLET | Freq: Every day | ORAL | Status: DC

## 2013-06-01 MED ORDER — ALPRAZOLAM 0.25 MG PO TABS: 0.2500 mg | ORAL_TABLET | Freq: Four times a day (QID) | ORAL | Status: DC | PRN

## 2013-06-01 NOTE — Progress Notes (Signed)
Discharge summary sent to payer through [facility]  

## 2013-06-01 NOTE — Discharge Summary (Signed)
Physician Discharge Summary  Allison Rodgers ZOX:[id number] DOB: 04/27/1924 DOA: 05/29/2013  PCP: Allison Hughs, MD  Admit date: 05/29/2013 Discharge date: 06/01/2013  Recommendations for Outpatient Follow-up:  1. Pt will need to follow up with PCP in 2-3 weeks post discharge 2. Please obtain BMP to evaluate electrolytes and kidney function 3. Please also check CBC to evaluate Hg and Hct levels 4. Please note that Lasix dose was decreased from 40 mg QD to 20 mg QD due to acute renal failure 5. Please also note I have discussed insulin regimen with Dr. Lucianne Muss her endocrinologist 6. Recommendation is to stop insulin and to start low dose Januvia 25 mg PO QD and with close follow up for dose readjustment 7. This was discussed with son at length    Discharge Diagnoses: Hyponatremia secondary to failure to thrive  Principal Problem:   Volume depletion Active Problems:   Dementia   Hyponatremia   Acute renal failure (ARF)   HYPERTENSION   CKD (chronic kidney disease), stage III   Type II or unspecified type diabetes mellitus with neurological manifestations, uncontrolled(250.62)   GERD  Discharge Condition: Stable  Diet recommendation: Heart healthy diet discussed in details   Brief narrative:  79 y.o. female with hx of dementia, resident of assisted living, DM on insulin injection, obesity, DJD, HTN, brought in by EMS from assisted living because her BS was in the 50's. She was given an amp of D50, with good result. Further evaluation showed she has ARF with Cr of 3.5, with baseline 1.4, and this was the reason for her admission. Son stated that she has poor appetite and decrease oral intake. Her medication list also included 2 diuretics (Lasix and Spironolactone), but no ACE-I. She has no UTI symptoms. Denied nausea, vomiting, abdominal pain, fever or chills. Her k was WNL, and she has no leukocytosis. Hospitalist was asked to admit her for ARF, volume depletion and low CBG.   Principal  Problem:  Hyponatremia secondary to volume depletion and failure to thrive  - evident by hypercalcemia, acute renal failure, hyponatremia and physical exam findings  - pt is progressing well and feels better this AM  - creatinine trending down, sodium trending up and calcium is within normal limits this AM  - d/c IVF - tolerating PO intake well  Active Problems:  Dementia  - appears to be stable and at pt's baseline this AM  Hyponatremia  - secondary to volume status, pre renal etiology  - trending up on IVF and responding well, d/c IVF today as pt ready for discharge  Acute renal failure (ARF)  - secondary to pre renal etiology  - creatinine is trending down, now at pt's baseline ~ 1.7 HYPERTENSION  - reasonable inpatient control  CKD (chronic kidney disease), stage III  - creatinine in September 2014 was 2.3 but her baseline apparently remains at 1.5 - 1.7 Type II or unspecified type diabetes mellitus with neurological manifestations, uncontrolled  - A1C 6.2, stopped insulin and started [place] upon discharge  GERD  - continue PPI   Consultants:  None Procedures/Studies:  None Antibiotics:  None  Code Status: Full  Family Communication: Pt and son at bedside  Disposition Plan: Discharge today      Discharge Exam: Filed Vitals:   06/01/13 0525  BP: 129/69  Pulse: 67  Temp: 98 F (36.7 C)  Resp: 18   Filed Vitals:   05/31/13 0610 05/31/13 1400 05/31/13 2205 06/01/13 0525  BP: 119/76 122/68 103/68 129/69  Pulse:  65 62 69 67  Temp: 98.1 F (36.7 C) 98.6 F (37 C) 97.9 F (36.6 C) 98 F (36.7 C)  TempSrc: Oral Oral Oral Oral  Resp: 18 18 18 18   Height:      Weight:      SpO2: 100% 100% 100% 100%    General: Pt is alert, follows commands appropriately, not in acute distress Cardiovascular: Regular rate and rhythm, S1/S2 +, no murmurs, no rubs, no gallops Respiratory: Clear to auscultation bilaterally, no wheezing, no crackles, no rhonchi Abdominal: Soft,  non tender, non distended, bowel sounds +, no guarding Extremities: no edema, no cyanosis, pulses palpable bilaterally DP and PT Neuro: Grossly nonfocal  Discharge Instructions  Discharge Orders   Future Appointments Provider Department Dept Phone   06/07/2013 11:00 AM Allison Littler, MD [facility] [phone]   08/18/2013 10:45 AM Allison Rodgers, DPM [facility] [phone]   09/27/2013 2:30 PM Allison Marker, MD [facility] [phone]   Future Orders Complete By Expires   Diet - low sodium heart healthy  As directed    Increase activity slowly  As directed        Medication List    STOP taking these medications       insulin aspart 100 UNIT/ML injection  Commonly known as:  novoLOG     insulin glargine 100 UNIT/ML injection  Commonly known as:  LANTUS      TAKE these medications       acetaminophen 500 MG tablet  Commonly known as:  TYLENOL  500-1,000 mg every 6 (six) hours as needed for pain. Per bottle as needed for pain     ALPRAZolam 0.25 MG tablet  Commonly known as:  XANAX  Take 1 tablet (0.25 mg total) by mouth every 6 (six) hours as needed for anxiety.     aspirin 81 MG chewable tablet  Chew 81 mg by mouth daily.     CALTRATE 600 PO  Take 600 mg by mouth.     chlorpheniramine 4 MG tablet  Commonly known as:  CHLOR-TRIMETON  Take 4 mg by mouth 2 (two) times daily as needed (for nasal drip, drainage).     dorzolamide 2 % ophthalmic solution  Commonly known as:  TRUSOPT  Place 1 drop into both eyes at bedtime.     fluocinonide cream 0.05 %  Commonly known as:  LIDEX  Apply 1 application topically 2 (two) times daily as needed (rash).     furosemide 20 MG tablet  Commonly known as:  LASIX  Take 1 tablet (20 mg total) by mouth daily.     guaiFENesin-dextromethorphan 100-10 MG/5ML syrup  Commonly known as:  ROBITUSSIN DM  Take 5 mLs by mouth 3 (three) times daily as needed for cough.      oxyCODONE-acetaminophen 5-325 MG per tablet  Commonly known as:  PERCOCET  Take 1 tablet by mouth every 6 (six) hours as needed for pain.     pantoprazole 40 MG tablet  Commonly known as:  PROTONIX  Take 1 tablet (40 mg total) by mouth 2 (two) times daily.     sitaGLIPtin 25 MG tablet  Commonly known as:  JANUVIA  Take 1 tablet (25 mg total) by mouth daily.     sodium chloride 0.65 % nasal spray  Commonly known as:  OCEAN  Place 2 sprays into the nose as needed for congestion.     spironolactone 100 MG tablet  Commonly known as:  ALDACTONE  Take 1 tablet (100 mg total) by mouth 2 (two) times daily.     XALATAN 0.005 % ophthalmic solution  Generic drug:  latanoprost  1 drop at bedtime.           Follow-up Information   Follow up with Allison Hughs, MD.   Specialty:  Pulmonary Disease   Contact information:   [address] [place] [address] [phone]       Follow up with Allison Presto, MD. (As needed if symptoms worsen)    Specialty:  Internal Medicine   Contact information:   [address] [place] [address] [phone]        The results of significant diagnostics from this hospitalization (including imaging, microbiology, ancillary and laboratory) are listed below for reference.     Microbiology: Recent Results (from the past 240 hour(s))  SURGICAL PCR SCREEN     Status: None   Collection Time    05/29/13 11:36 PM      Result Value Range Status   MRSA, PCR NEGATIVE  NEGATIVE Final   Staphylococcus aureus NEGATIVE  NEGATIVE Final   Comment:            The Xpert SA Assay (FDA     approved for NASAL specimens     in patients over 73 years of age),     is one component of     a comprehensive surveillance     program.  Test performance has     been validated by [employer] for patients greater     than or equal to 56 year old.     It is not intended     to diagnose infection nor to     guide or monitor treatment.      Labs: Basic Metabolic Panel:  Recent Labs Lab 05/29/13 2000 05/30/13 0425 05/31/13 0515 06/01/13 0612  NA 124* 127* 131* 134*  K 4.8 4.8 4.2 4.4  CL 86* 94* 101 106  CO2 25 20 22 19   GLUCOSE 247* 165* 193* 83  BUN 75* 65* 44* 29*  CREATININE 3.50* 3.13* 2.13* 1.75*  CALCIUM 10.8* 9.9 9.3 9.1   Liver Function Tests:  Recent Labs Lab 05/29/13 2000  AST 30  ALT 18  ALKPHOS 66  BILITOT 0.5  PROT 7.4  ALBUMIN 3.5   CBC:  Recent Labs Lab 05/29/13 1907 05/31/13 0515 06/01/13 0612  WBC 9.7 5.3 4.1  NEUTROABS 6.0  --   --   HGB 14.0 11.1* 11.5*  HCT 42.2 35.0* 36.2  MCV 75.2* 76.8* 77.2*  PLT 237 153 137*   CBG:  Recent Labs Lab 05/31/13 0741 05/31/13 1200 05/31/13 1651 05/31/13 2152 06/01/13 0804  GLUCAP 173* 177* 90 83 89     SIGNED: Time coordinating discharge: Over 30 minutes  Allison Presto, MD  [facility] 06/01/2013, 10:13 AM Pager [phone]  If 7PM-7AM, please contact night-coverage [email or web address] Password [email or web address]

## 2013-06-01 NOTE — Progress Notes (Signed)
Physical Therapy Treatment Patient Details Name: Allison Rodgers MRN: [id number] DOB: 10-27-1923 Today's Date: 06/01/2013 Time: 8469-6295 PT Time Calculation (min): 46 min  PT Assessment / Plan / Recommendation  History of Present Illness 79 yo female admitted from ALF with acute renal failure and hypoglycemia. Hx of dementia, DM, HTN, DJD, falls.    PT Comments   Progressing with mobility. Pt c/o mild "wooziness" during session but was able to participate. Plan is for return to ALF possibly today.   Follow Up Recommendations  Home health PT;Supervision/Assistance - 24 hour     Does the patient have the potential to tolerate intense rehabilitation     Barriers to Discharge        Equipment Recommendations  None recommended by PT    Recommendations for Other Services OT consult  Frequency Min 3X/week   Progress towards PT Goals Progress towards PT goals: Progressing toward goals  Plan Current plan remains appropriate    Precautions / Restrictions Precautions Precautions: Fall Restrictions Weight Bearing Restrictions: No   Pertinent Vitals/Pain Pt denies pain    Mobility  Bed Mobility Bed Mobility: Not assessed Transfers Transfers: Sit to Stand;Stand to Sit Sit to Stand: 4: Min assist;From toilet;From chair/3-in-1 Stand to Sit: 4: Min assist;To toilet;To chair/3-in-1 Details for Transfer Assistance: x3. VCs safety, technique, hand placement. Assist to rise, stabilize, control descent. Increased time Ambulation/Gait Ambulation/Gait Assistance: 4: Min guard Ambulation Distance (Feet): 150 Feet Assistive device: Rolling walker Ambulation/Gait Assistance Details: VCS distance from walker. slow gait speed.  Gait Pattern: Step-through pattern;Decreased stride length;Trunk flexed    Exercises General Exercises - Lower Extremity Ankle Circles/Pumps: AROM;Both;10 reps;Seated Long Arc Quad: AROM;10 reps;Seated Heel Slides: AAROM;Both;10 reps;Seated Hip ABduction/ADduction:  Both;10 reps;AAROM;Seated   PT Diagnosis:    PT Problem List:   PT Treatment Interventions:     PT Goals (current goals can now be found in the care plan section)    Visit Information  Last PT Received On: 06/01/13 Assistance Needed: +1 History of Present Illness: 79 yo female admitted from ALF with acute renal failure and hypoglycemia. Hx of dementia, DM, HTN, DJD, falls.     Subjective Data      Cognition  Cognition Arousal/Alertness: Awake/alert Behavior During Therapy: WFL for tasks assessed/performed Overall Cognitive Status: Within Functional Limits for tasks assessed    Balance     End of Session PT - End of Session Equipment Utilized During Treatment: Gait belt Activity Tolerance: Patient tolerated treatment well Patient left: in chair;with call bell/phone within reach;with family/visitor present   GP     Rebeca Alert, MPT Pager: [phone]

## 2013-06-01 NOTE — Care Management Note (Signed)
    Page 1 of 1   06/01/2013     5:37:40 PM   CARE MANAGEMENT NOTE 06/01/2013  Patient:  Allison Rodgers, Allison Rodgers   Account Number:  [id number]  Date Initiated:  05/30/2013  Documentation initiated by:  Algernon Huxley  Subjective/Objective Assessment:   79 year old female admitted with ARF.     Action/Plan:   From ALF.   Anticipated DC Date:  06/02/2013   Anticipated DC Plan:  ASSISTED LIVING / REST HOME  In-house referral  Clinical Social Worker      DC Planning Services  CM consult      Choice offered to / List presented to:             Status of service:  Completed, signed off Medicare Important Message given?  NA - LOS <3 / Initial given by admissions (If response is "NO", the following Medicare IM given date fields will be blank) Date Medicare IM given:   Date Additional Medicare IM given:    Discharge Disposition:  SKILLED NURSING FACILITY  Per UR Regulation:  Reviewed for med. necessity/level of care/duration of stay  If discussed at Long Length of Stay Meetings, dates discussed:    Comments:

## 2013-06-01 NOTE — Progress Notes (Signed)
Pt returned to ALF today via family transport. Family / pt in agreement with d/c plan.  Cori Razor LCSW [phone]

## 2013-06-07 ENCOUNTER — Encounter: Payer: Self-pay | Admitting: Endocrinology

## 2013-06-07 ENCOUNTER — Telehealth: Payer: Self-pay | Admitting: *Deleted

## 2013-06-07 ENCOUNTER — Ambulatory Visit (INDEPENDENT_AMBULATORY_CARE_PROVIDER_SITE_OTHER): Payer: Medicare PPO | Admitting: Endocrinology

## 2013-06-07 VITALS — HR 42 | Temp 98.2°F | Resp 12 | Ht 60.0 in | Wt 160.0 lb

## 2013-06-07 DIAGNOSIS — IMO0001 Reserved for inherently not codable concepts without codable children: Secondary | ICD-10-CM

## 2013-06-07 DIAGNOSIS — N183 Chronic kidney disease, stage 3 unspecified: Secondary | ICD-10-CM

## 2013-06-07 LAB — BASIC METABOLIC PANEL
CO2: 26 mEq/L (ref 19–32)
Calcium: 10.1 mg/dL (ref 8.4–10.5)
Chloride: 95 mEq/L — ABNORMAL LOW (ref 96–112)
Creatinine, Ser: 2.6 mg/dL — ABNORMAL HIGH (ref 0.4–1.2)
Glucose, Bld: 180 mg/dL — ABNORMAL HIGH (ref 70–99)
Potassium: 4.3 mEq/L (ref 3.5–5.1)

## 2013-06-07 LAB — COMPREHENSIVE METABOLIC PANEL
AST: 26 U/L (ref 0–37)
Albumin: 3.7 g/dL (ref 3.5–5.2)
BUN: 29 mg/dL — ABNORMAL HIGH (ref 6–23)
CO2: 26 mEq/L (ref 19–32)
Calcium: 10.1 mg/dL (ref 8.4–10.5)
Chloride: 95 mEq/L — ABNORMAL LOW (ref 96–112)
GFR: 22.55 mL/min — ABNORMAL LOW (ref >60.00)
Potassium: 4.3 mEq/L (ref 3.5–5.1)
Sodium: 134 mEq/L — ABNORMAL LOW (ref 135–145)

## 2013-06-07 LAB — LIPID PANEL
HDL: 49.8 mg/dL (ref >39.00)
Total CHOL/HDL Ratio: 3
Triglycerides: 172 mg/dL — ABNORMAL HIGH (ref 0.0–149.0)

## 2013-06-07 LAB — HEMOGLOBIN A1C: Hgb A1c MFr Bld: 6.3 % (ref 4.6–6.5)

## 2013-06-07 MED ORDER — INSULIN GLARGINE 100 UNIT/ML ~~LOC~~ SOLN: 5.0000 [IU] | Freq: Every day | SUBCUTANEOUS | Status: DC

## 2013-06-07 NOTE — Patient Instructions (Signed)
Fax glucose readings in 1 week to [phone]

## 2013-06-07 NOTE — Telephone Encounter (Signed)
Allison Rodgers from [facility] wants a written order saying if patients blood sugar gets low that it's okay to hold her insulin,  CB # [phone]

## 2013-06-07 NOTE — Progress Notes (Signed)
Patient ID: Allison Rodgers, female   DOB: 1923/08/20, 79 y.o.   MRN: [id number]    Reason for Appointment : Followup for Type 2 Diabetes  History of Present llness          Diagnosis: Type 2 diabetes mellitus, date of diagnosis: ? 2007       Past history:  It appears that she had blood sugars over 500 at the time of diagnosis She was started on insulin at the time of diagnosis and has been continuing on this in various regimens Also her weight has been previously significantly more, about 200 pounds  Recent history: The patient is  at a new nursing home after discharge from the hospital. She was admitted for acute renal failure and hypoglycemia On her last visit she was taking Lantus twice a day, total daily dose 32 units with still some high readings Her Lantus was stopped in the hospital because of hypoglycemia and she was discharged on 25 mg of Januvia based on her renal function She has had difficulty with appetite and food intake again  She now appears to be having slightly high readings  Glucose monitoring:  done 2 times  a day         Glucometer: ?     Blood Glucose readings in the morning: 125-182, bedtime 100-177, recently higher           Hypoglycemia frequency: None recently             Meals: 3 meals per day.  poor appetite    Physical activity: exercise: not able to walk much Dietician consultations: Several years ago                 Oral hypoglycemic drugs the patient is taking are: Januvia       Side effects from medications have been: None reported  Retinal exam: Most recent:.2014    Lab Results  Component Value Date   HGBA1C 6.2* 05/30/2013   HGBA1C 9.9* 03/24/2013   HGBA1C 6.6* 12/22/2012   Lab Results  Component Value Date   MICROALBUR 1.5 08/28/2010   LDLCALC 90 11/25/2011   CREATININE 1.75* 06/01/2013     Filed Weights   06/07/13 1120  Weight: 160 lb (72.576 kg)      Medication List       This list is accurate as of: 06/07/13 11:33 AM.  Always  use your most recent med list.               acetaminophen 500 MG tablet  Commonly known as:  TYLENOL  500-1,000 mg every 6 (six) hours as needed for pain. Per bottle as needed for pain     ALPRAZolam 0.25 MG tablet  Commonly known as:  XANAX  Take 1 tablet (0.25 mg total) by mouth every 6 (six) hours as needed for anxiety.     aspirin 81 MG chewable tablet  Chew 81 mg by mouth daily.     CALTRATE 600 PO  Take 600 mg by mouth.     chlorpheniramine 4 MG tablet  Commonly known as:  CHLOR-TRIMETON  Take 4 mg by mouth 2 (two) times daily as needed (for nasal drip, drainage).     dorzolamide 2 % ophthalmic solution  Commonly known as:  TRUSOPT  Place 1 drop into both eyes at bedtime.     fluocinonide cream 0.05 %  Commonly known as:  LIDEX  Apply 1 application topically 2 (two) times daily as needed (rash).  furosemide 20 MG tablet  Commonly known as:  LASIX  Take 1 tablet (20 mg total) by mouth daily.     guaiFENesin-dextromethorphan 100-10 MG/5ML syrup  Commonly known as:  ROBITUSSIN DM  Take 5 mLs by mouth 3 (three) times daily as needed for cough.     oxyCODONE-acetaminophen 5-325 MG per tablet  Commonly known as:  PERCOCET  Take 1 tablet by mouth every 6 (six) hours as needed for pain.     pantoprazole 40 MG tablet  Commonly known as:  PROTONIX  Take 1 tablet (40 mg total) by mouth 2 (two) times daily.     sitaGLIPtin 25 MG tablet  Commonly known as:  JANUVIA  Take 1 tablet (25 mg total) by mouth daily.     sodium chloride 0.65 % nasal spray  Commonly known as:  OCEAN  Place 2 sprays into the nose as needed for congestion.     spironolactone 100 MG tablet  Commonly known as:  ALDACTONE  Take 1 tablet (100 mg total) by mouth 2 (two) times daily.     XALATAN 0.005 % ophthalmic solution  Generic drug:  latanoprost  1 drop at bedtime.        Allergies:  Allergies  Allergen Reactions  . Penicillins Other (See Comments)    REACTION: causes  hallucinations  . Sulfamethoxazole-Trimethoprim Swelling    REACTION: rash  . Bactrim     Unknown per mar  . Trimethoprim     Unknown per mar     Past Medical History  Diagnosis Date  . GERD (gastroesophageal reflux disease)   . Chronic abdominal pain   . DJD (degenerative joint disease)   . Hypertensive cardiovascular disease   . Obesity   . Gastritis, chronic   . 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 Esophageal stricture   . Hiatal hernia   . Hx of adenomatous colonic polyps   . Diverticulosis   . Dyslipidemia   . Arthritis   . DM (diabetes mellitus)   . Hypertension   . Microcytic anemia   . Edema   . Renal disorder     Past Surgical History  Procedure Laterality Date  . Appendectomy    . Cholecystectomy  1999  . Vesicovaginal fistula closure w/ tah  1957  . Bilateral oophorectomy  1957  . Hernia repair  08/2005    umbilical and ventral hernia repair by Dr. Jamey Ripa  . Rotator cuff repair  7/05    right  . Back surgery      Family History  Problem Relation Age of Onset  . Stroke Mother   . Cancer Father   . Hypertension Other     Social History:  reports that she has never smoked. She has never used smokeless tobacco. She reports that she does not drink alcohol or use illicit drugs.    Review of Systems       Lipids: She is currently not on any medications         She feels  her feet are somewhat tender on the bottom on walking   She is still taking diuretics   Physical Examination:  Wt 160 lb (72.576 kg)  BMI 31.25 kg/m2    She is alert    ASSESSMENT:  Diabetes type 2  Her blood sugars are slowly increasing with her stopping insulin completely and also improving renal function She had mild increase in blood sugar after supper and also about  the same in the morning despite taking low dose Januvia Most likely will need to have increased insulin dosage again as before but this will depend on her food  intake Discussed this with the family. Her treatment choices are somewhat limited because of renal dysfunction However her blood sugar targets are probably going to be liberal because of her age and multiple medical problems as well as variable food intake  PLAN:   Lantus 5 units at suppertime and then gradually increase diet if needed She can continue Januvia 25 mg to help with postprandial hyperglycemia Check renal function today   Suhani Stillion 06/07/2013, 11:33 AM

## 2013-06-09 ENCOUNTER — Telehealth: Payer: Self-pay | Admitting: Internal Medicine

## 2013-06-09 NOTE — Telephone Encounter (Signed)
No need for cbc until ov - schedule with tammy in 2 weeks

## 2013-06-09 NOTE — Telephone Encounter (Signed)
I spoke with Allison Rodgers. She is wanting order for PT/OT and nursing. Please advise MW thanks

## 2013-06-09 NOTE — Telephone Encounter (Signed)
Ok with me 

## 2013-06-09 NOTE — Telephone Encounter (Signed)
Spoke with Allison Rodgers at [facility] in [place] Requesting order be sent for PRN diarrhea meds-- fax to [phone]  Verlon Au, have you seen a fax come across your desk today in regards to pt blood sugar injections/BS checks? Allison Rodgers states that they faxed something yesterday to yours/Dr Rolin Barry attn regarding when to give Lantus inj and when to do routine BS check. Please advise. Thanks.

## 2013-06-09 NOTE — Telephone Encounter (Signed)
nicole advised and appt set for 06-22-13. Carron Curie, CMA

## 2013-06-09 NOTE — Telephone Encounter (Signed)
Spoke with Verlon Au-- Both orders are in MW look-at to be signed. Will be faxed back as soon as he signs.  Nothing further needed.

## 2013-06-09 NOTE — Telephone Encounter (Signed)
I called and gave Joni Reining VO for this. She also received an order from the hospital a few minutes ago pt needs to f/u on CBC. It does not say when but says to f/u with PCP in 2-3 weeks. Joni Reining can draw this but they just need to know when? Please advise thanks

## 2013-06-10 ENCOUNTER — Telehealth: Payer: Self-pay | Admitting: Internal Medicine

## 2013-06-10 NOTE — Telephone Encounter (Signed)
Called and spoke with laura and she stated that they need verbal to do skilled nursing checks on the pt 2 x per week for 3 weeks and then 1 x per week for 2 weeks.  MW please advise if this is ok to give ok for this order.  Thanks  Allergies  Allergen Reactions  . Penicillins Other (See Comments)    REACTION: causes hallucinations  . Sulfamethoxazole-Trimethoprim Swelling    REACTION: rash  . Bactrim     Unknown per mar  . Trimethoprim     Unknown per mar

## 2013-06-10 NOTE — Telephone Encounter (Signed)
Ok with me 

## 2013-06-10 NOTE — Telephone Encounter (Signed)
lmomtcb x1 for Laura.  

## 2013-06-11 ENCOUNTER — Telehealth: Payer: Self-pay | Admitting: Internal Medicine

## 2013-06-11 NOTE — Telephone Encounter (Signed)
I spoke with Allison Rodgers as she called in regards to other orders as well. She reports she will make her supervisor aware of the VO approval.

## 2013-06-11 NOTE — Telephone Encounter (Signed)
VO giving. Nothing further needed

## 2013-06-11 NOTE — Telephone Encounter (Signed)
LMtcbx2 with Vernona Rieger. Carron Curie, CMA

## 2013-06-11 NOTE — Telephone Encounter (Signed)
lmomtcb x1 for Allison Rodgers.

## 2013-06-11 NOTE — Telephone Encounter (Signed)
Aram Beecham with OT calling for okay to start therapy.

## 2013-06-14 ENCOUNTER — Telehealth: Payer: Self-pay | Admitting: Internal Medicine

## 2013-06-14 NOTE — Telephone Encounter (Signed)
lmomtcb x1 for Laura.  

## 2013-06-15 ENCOUNTER — Telehealth: Payer: Self-pay | Admitting: Pulmonary Disease

## 2013-06-15 NOTE — Telephone Encounter (Signed)
LMTCBx2 for Allison Rodgers. Carron Curie, CMA

## 2013-06-15 NOTE — Telephone Encounter (Signed)
Will sign off message 

## 2013-06-16 NOTE — Telephone Encounter (Signed)
LMTCBx3. Jennifer Castillo, CMA  

## 2013-06-17 NOTE — Telephone Encounter (Signed)
Per protocol I will sign off on message. Jennifer Castillo, CMA  

## 2013-06-18 ENCOUNTER — Telehealth: Payer: Self-pay | Admitting: Internal Medicine

## 2013-06-18 DIAGNOSIS — R1310 Dysphagia, unspecified: Secondary | ICD-10-CM

## 2013-06-18 NOTE — Telephone Encounter (Signed)
I spoke with Allison Rodgers she is aware referral placed. Nothing further needed

## 2013-06-18 NOTE — Telephone Encounter (Signed)
Pt nurse states she is having difficulty swallowing and loss of appetite and she wants a GI referral for the pt. Please advise. Carron Curie, CMA

## 2013-06-18 NOTE — Telephone Encounter (Signed)
Fine with me to refer to [facility]

## 2013-06-22 ENCOUNTER — Ambulatory Visit: Payer: Medicare PPO | Admitting: Gastroenterology

## 2013-06-22 ENCOUNTER — Encounter: Payer: Self-pay | Admitting: Adult Health

## 2013-06-22 ENCOUNTER — Ambulatory Visit (INDEPENDENT_AMBULATORY_CARE_PROVIDER_SITE_OTHER): Payer: Medicare PPO | Admitting: Adult Health

## 2013-06-22 VITALS — BP 128/86 | HR 92 | Temp 96.9°F | Ht 60.0 in | Wt 150.4 lb

## 2013-06-22 DIAGNOSIS — K219 Gastro-esophageal reflux disease without esophagitis: Secondary | ICD-10-CM

## 2013-06-22 DIAGNOSIS — F0390 Unspecified dementia without behavioral disturbance: Secondary | ICD-10-CM

## 2013-06-22 NOTE — Assessment & Plan Note (Signed)
Cont to monitor  Advised to increase fluids intake Decrease spironolactone 25mg  Twice daily   Repeat bmet on return  If not improving consider referral to renal

## 2013-06-22 NOTE — Assessment & Plan Note (Signed)
Memory impairment ? FTT  Has multiple co -morbidities  Consider adding Aricept or Namenda however son declines as she is on so many meds right now.  Will adjust meds to see if will help with appetite . If not improving consider referral to GI for second opinion- son declined today.

## 2013-06-22 NOTE — Progress Notes (Signed)
Subjective:     Patient ID: Allison Rodgers, female   DOB: [birth year]   MRN: [id number]  Brief patient profile:  66  yobf with morbid obesity complicated by DM, HTN and Dyslipidemia and chronic venous insufficiency with dependent edema.    August 28, 2010 cpx getting all better. limited by legs/hip/ knees no sob or increase leg swelling. >>labs done w/no rx changes   September 11, 2010 --Pt here for follow and med calendar. Last ov with CPX. Labs were w/no significant changes. A1C at 6.7 , s.CR holding at 1.4 . We reviewed all her labs in detail.Urine did show microscopic hematuria . Discussed a DM and low cholestrol diet. She is recovering from a prolonged bronchitis-feeling better but not back to baseline. She has clear drainge and energy level is low. We reviewed all her meds and organized her med calendar.  rec follow calendar   05/10/2011. Follow up and med review.  Seen 2 weeks ago for follow up . Labs showed BS elevated at 300 nonfasting.  Disucssed diet and exercise. Weight is going down with dietary changes.  Will check A1C today to evaluate average control.   We reviewed all her meds today and updated her med calendar w/ pt education  Appears she is taking her meds correctly.   Complains that she has seen small specks of red blood on tissues with wiping after bowel movement   Last colonoscopy was 2009.  No abdominal pain or n/v/d.  rec Follow med calendar closely and bring to each visit.  Use Preparation H Hemorrhoid cream after bowel movement  Low sweet diet.   Flu shot today   07/26/2011 Wert/ ov cc Pt c/o increased edema "from the hips down" x 2 months - taking an extra furosemide as per calendar "maybe once a week" with no benefit (calendar clearly says take one extra daily if needed for swelling).  Denies orthopnea. [place]ed [place] to office on day of ov s doe. Denies salt excess and takes her maintenance doses of furosemide and spironlactone as per calendar  instructions rec Change the as needed furosemide to 40 mg 2 extra daily if needed (can use up to 2 extra daily or a total of 4) Watch the salt and salty food  08/23/2011 f/u ov/Wert cc no better swelling in legs on max doses of furosemide as per calendar, new  Bifrontal L > R bandlike pressure  HA typically after lunch daily x sev weeks, ? viz Floaters not directely related to ha with no nausea  Or sinus complaints, better p tylenol and never in ams rec Please see patient coordinator before you leave today  to schedule venous dopplers> neg bilateral 08/27/11 Stop all caffeine - pay attention to the chronologic pattern of the headache and try alprazolam to see if it helps and call if worsens See [name] for arthritis > did not go     11/25/2011 f/u ov/Wert for multiple chronic medical problems cc worse arthritis esp R back and leg x one year, swelling is some better on present rx reviewed with her on med calendar. No sob, cough. Better control of leg swelling, no overt polydypsia, polyuria, viz changes rec Be sure to see Dr Gladstone Lighter about your back and leg pain Please remember to go to the lab  department downstairs for your tests - we will call you with the results when they are available.   03/02/2012 f/u ov/Wert not using med calendar cc  c/o having slight  HA x 3 months and states "feels bad and weak all of the time". Sugars fasting running as high has 300 fasting on glimepririede 2 mg one half each am, persistent leg swelling despite furosemide and lasix. Admits getting very forgetful re details of care and trying to use the alphabetized epic list for med rec purposes.  HA is generalized, in pms, better with tylenol with no assoc viz or neuro c/os or nausea, always absent in ams >>Change the furosemide to 2 in am and at lunch with additonal 2 at supper if needed  Increase Glimperide to 2 mg each am   03/18/2012 Follow up and MMSE /Med calendar  We reviewed all her meds and organized  them into a med calendar with pt education  She has a lot of old bottles that she says she keeps and put new meds into old bottles  Advised this is not a good idea.  Says her memory is slipping. Gets anxious when she can not remember things. Has gotten lost a couple of times. Son in [place]. She is considering going to live with him or him come here.  Does not want to go to NH.  MMSE scored a 29/30 but slow responses.  Clock draw was very abnormal. With hands of clock outside clock.  We dicussed referral to neuro due to concern for possible dementia  She refuses to go to neuro wants to be treated here.  Labs were unremarkable with neg RPR , nml tsh and nml b12 She denies headache, visual/speech changes.  Does have anxiety at times. No significant depression like symptoms.  Sugars has been running 200-250 . Amaryl was increased 2 weeks ago w/ A1C at 8.4  No low sugars.    04/15/2012 f/u ov/Wert confused with instructions, meds, "son took her list back to [place]" felt worse x 2 weeks with weak and dizziness but no specific pattern (not related to meals).  Fasting bs remain in low 200s  >med review   04/29/12 Follow up and med review     [name] Her last visit. Patient was having difficulty remembering her medications and had misplaced her medication calendar. She is brought all of her medications in today. We reviewed all her medications updated. Her medication calendar with patient education. It does appear that all her medications are up-to-date and have been refilled on time, according to the medication bottles She does easily get confused if you cannot slow down and go over the medications and with slowly We discussed the importance of keeping her list up to date and keep it with her at all times especially when she is seen at the doctor's office Blood sugars are slowly decreasing with her medication adjustments recently She has  had no low sugars She denies any chest pain, shortness of breath, abdominal pain, or increased edema We discussed once again about her memory and referral to neurology. However, she declined Referral  05/28/2012 Acute OV  Complains of right back and hip pain.  pt fell at home 05-25-12, right hip and groin area/right elbow are sore.   reports lost her balance and fell forward landing on right side.  She was able to get herself, and ambulates with minimal pain. Over the last 2 days, has been progressively worsening over and tender along the right side of her lower back into her right hip and right groin area, especially with walking. She denies any urinary symptoms, chest pain, loss of  consciousness, visual or speech changes, extremity weakness, radicular symptoms, or leg swelling >xray w/ no acute process   06/08/2012 Follow up  Returns for follow up for hip and back pain, seen 2 weeks ago after a fall.  Xrays showed no fracture.  Feeling better but still some residual soreness.  Does have some drainage and dry cough for 2 weeks.  rec Alternate ice and heat to back and hip  May use Tylenol arthritis for pain As needed   IF severe pain may use Roxicet As needed  -may cause you to be sleepy and off balance.   06/18/2012 f/u ov/Wert cc  Hips/ back better, no need for narcotic rx.  Memory no better on aricept and pt wants to stop.  Seems to using med calendar well. rec Stop fluticasone and the aricept Dymista one twice daily GERD .     09/16/2012 f/u ov/Wert cc pt states having increase sob, wheezing when moving and sitting still. along with heart palp. x 49month - seems worse around 4pm and better p supper >>RAST + /IGE ~319 , CT sinus neg   09/29/2012 Follow u p Pt returns for a 2 week follow up and med review  Her son from [place] is in town. He is trying to help her with her meds  We organized all her meds and updated her med calendar w/ pt education  She has a couple of old/expired  bottles -it is unclear if she is putting new meds into old bottles or they are expired.  She does admit she does not take her fluid pills as recommended.  I emphasized the importance of med compliance.  Son is concerned regarding memory . She does have mild forgetfullness but mostly seems to not process information well sometimes.  Repeated MMSE exam today. Tested well w/ good flow at 30/30 . Clock draw was slightly abn as she wrote the time instead of the hands of clock.   Previous MMSE 03/2012 29/30 . rpr /b12 nml 8/13 .  Labs last ov showed A1C tr up at 8.4.  No polyuria/polydipsia.  Previously started on Aricept 03/2012 . Stoppled 06/2012   12/22/2012 f/u ov/Wert re dm/hbp using calendar well  Chief Complaint  Patient presents with  . Follow-up    She c/o numbness and pain in her right arm since Sept 2013. She has been having a hard time picking things up. She also c/o continuing to have black stools and vomiting several times per month.  vomiting sev x per months, assoc with nausea,  black stools daily, no abd pain, maintaining on asa and ppi/hs h2. No wt loss.  Not clear whether has arm/ hand are really weak or numb or just weak due to pain when gripping, persistent daily symptoms snc Sept 2013.  >>referral to GI and Neuro   03/24/2013 Follow up  She returns for a follow up office visit.  Seen by Dr. Sharlett Iles earlier this year in May  Underwent endoscopy 12/30/12 that was unremarkable except for H Pylori .  Tx w/ PYLERA therapy. She complains that stomach still upset on/off and dark stools on /off. Last colonscopy 2009 with severe diverticular dz. HBG in 12/2012 was Nml.  Was suppose to follow up with Neurology however appointment was changed .  Continues to have memory issues.  Son is with her today but is leaving to go back home.  She is leaving alone . We went over meds it is obvious that she is unclear of what she  is taking and gets confused easily.  BS at home 100-200 . Does have  polyuria.  No fever, chest pain, dyspnea, edema.  ? taking Mobic on/off.  >>refer to GI , d/c mobic   05/05/13 Post Hospital follow up  Admitted 8/19-25 for acute encephalopathy with DKA , possible Mennigitis Bacterial vs Viral . Tx with abx and antivirals. CT head with mod atrophy, no acute finding.  DM meds were adjusted.  Discharged to rehab center.  Returns today with son, feeling better. Has been undergoing rehab with PT /OT. Getting stronger but still weak.  Is going to transfer this week to Assisted Living.  Has been referred to Endocrinology with Dr Lucianne Muss for DM management.-Lantus was changed to 18 u in am and 14 u in pm. Amaryl/Januvia were stopped.   Was referred back to Neuro for dementia - felt aricept +/- namenda may be indicated but will hold off for now d/t recent acute illness. Leave to PCP for decision in future.  >>no changes   06/22/2013 Post Hospital follow up  Recently admitted for dehydration and FTT complicated by acute on chronic renal failure.  Improved with IVF hydration.  Wt down 18lbs since last ov.  Not eating. Feels like everything tastes bad. Has seen Dr. Jarold Motto in GI for decreased appetite , wt loss, and diverticulitis . Tx w/ flagyl and cipro . No improvement.  Insulin stopped in hospital due to hypoglycemia and started on januvia.  Was seen by endrocrinology last week. Insulin restated with dose adjusted by Dr. Lucianne Muss.  Labs last ov with Dr. Lucianne Muss scr tr up 2.6. Denies vomiting or diarrhea, fever, leg swelling , bloody stools , urinary symptoms. No worsening of memory or behavior issues.  Does feel anxious a lot . Denies depression or cyring episodes.  Feels she is more active , working with PT/OT/speech.  At [facility] with son today.        ROS  The following are not active complaints unless bolded sore throat, dysphagia, dental problems, itching, sneezing,  nasal congestion or excess/ purulent secretions, ear ache,   fever, chills, sweats,  unintended wt loss, pleuritic or exertional cp, hemoptysis,  orthopnea pnd or leg swelling, presyncope, palpitations, heartburn, abdominal pain, anorexia,    dysuria,hematuria,  rash, arthralgias, visual complaints, headache, numbness weakness or ataxia or problems with walking or coordination,  change in mood/affect or memory.             Allergies    1) Pcn  2) Bactrim      Family History:   asthma in her brother who also has hypertension  mother died of stroke, also had diabetes and arthritis  father had multiple myeloma  4 siblings all in relatively good health  Neg for dementia     Social History:  Patient never smoked.  positive for second-hand smoke exposure  exercises 2-3 times a week  1 cup caffeine a day  married  1 child       Past Medical History:  GERD (ICD-530.81)  ABDOMINAL PAIN, CHRONIC (ICD-789.00)  FATIGUE, CHRONIC (ICD-780.79)  DEGENERATIVE JOINT DISEASE (ICD-715.90)  - L5 radiculopathy confirmed by mri 08/2005  --refer to ortho August 24, 2008 >>> Dr Darrelyn Hillock  HYPERTENSIVE CARDIOVASCULAR DISEASE (ICD-402.90)  OBESITY (ICD-278.00)  - Target wt = 158 for BMI < 30  - Referred [facility] 04/2005  GASTRITIS, CHRONIC (ICD-535.10)  BENIGN NEOPLASM OTH&UNSPEC SITE DIGESTIVE SYSTEM (ICD-211.9)  ESOPHAGEAL STRICTURE (ICD-530.3)..............................................Marland KitchenPatterson  - EGD 12/14/07  HIATAL HERNIA (ICD-553.3)  COLONIC POLYPS, ADENOMATOUS (ICD-211.3)  - Colonoscopy 12/14/07  DIVERTICULOSIS, COLON (ICD-562.10)  DYSLIPIDEMIA (ICD-272.4)  ARTHRITIS (ICD-716.90)  DM (ICD-250.00)  HYPERTENSION (ICD-401.9)  HEALTH MAINTENANCE  - dt 03/2003  - Pneumovax 03/2003  - CPX August 28, 2010  -Mammogram 10/2009, ? abn asymmetry >>repeat ok, last mammo 4/13 >neg  MICROCYTIC ANEMA  - DX 02/03/09, Stool g neg x 6, rx Fe 65 mg /day > improving March 17, 2009  Edema--08/10/08--venous doppler neg for dvt, ?ruptured bakers cyst-right  March 20, 2009 repeat  venous doppler > neg bilaterally  May 21, 2010 repeat venous dopplers >> neg bilat  Complex med regimen>>Meds reviewed with pt education and computerized med calendar September 11, 2010 , 05/10/2011 , 03/18/2012 . 04/29/12 Memory loss  MMSE 03/18/2012 > 29/30 , Clock Draw abn , neg RPR , nml b12 , 09/29/2012 >30/30 , clock abn >refer to neuro  Probable dementia, rx held due to multiple co-morbidities  09/29/2012 RAST + w/ IGE at 319        Objective:   Physical Exam obese ambulatory bf nad  Can no longer get on exam table without assistance with nl vital signs wt 213 10/21/08 > 204 April 26, 2009 > 08/23/2011  191> 11/25/2011  201 > 03/02/2012  193 > 198 03/18/2012 > 186 04/15/2012 >191 05/28/2012 >184 06/08/2012 > 06/18/2012 186 > 189 09/16/2012 > 12/22/2012  198 > 183 03/24/2013  >168 05/05/13 >150 06/22/2013  HEENT: nl dentition, turbinates, and orophanx. Nl external ear canals without cough reflex  Neck without JVD/Nodes/TM  Lungs clear to A and P bilaterally without cough on insp or exp maneuvers  RRR no s3 or murmur or increase in P2,tr edema   Abd soft and benign with nl excursion in the supine position. No bruits or organomegaly   EXT/Skin  :  venous insufficiency changes , no rash  Neuro:  No focal motor deficits , alert to person and place.     Assessment:        Plan:

## 2013-06-22 NOTE — Assessment & Plan Note (Addendum)
Weight loss, FTT and nausea/anorexia ? Etiology  Cont on GERD regimen Add Probiotic daily  Add glucerna shakes Three times a day   D/c calcium and chlortrimeton D/c percocet off MAR  Repeat CBC on return

## 2013-06-22 NOTE — Patient Instructions (Addendum)
Discontinue Percocet , Calcium  And Chlortrimeton.  Decrease Spironolactone 25mg  Twice daily    Begin Glucerna drinks Three times a day  .  follow up Dr. [name]  In 3 weeks and As needed   BMET prior to visit.  Please contact office for sooner follow up if symptoms do not improve or worsen or seek emergency care    Late add : check cbc on return ov.

## 2013-06-28 ENCOUNTER — Telehealth: Payer: Self-pay | Admitting: Internal Medicine

## 2013-06-28 NOTE — Telephone Encounter (Signed)
I spoke with Charmoin. I advised her pt DX code listed in chart ckd. Nothing further needed

## 2013-07-05 ENCOUNTER — Ambulatory Visit (INDEPENDENT_AMBULATORY_CARE_PROVIDER_SITE_OTHER): Payer: Medicare PPO | Admitting: Endocrinology

## 2013-07-05 ENCOUNTER — Encounter: Payer: Self-pay | Admitting: Endocrinology

## 2013-07-05 VITALS — BP 128/82 | HR 115 | Temp 98.6°F | Resp 12 | Ht 60.0 in | Wt 148.0 lb

## 2013-07-05 DIAGNOSIS — E1149 Type 2 diabetes mellitus with other diabetic neurological complication: Secondary | ICD-10-CM

## 2013-07-05 DIAGNOSIS — N184 Chronic kidney disease, stage 4 (severe): Secondary | ICD-10-CM

## 2013-07-05 NOTE — Patient Instructions (Signed)
LANTUS 10 UNITS TWICE DAILY  CHECK CBG AT 8 AM AND 4 PM ONE DAY AND NOON AND 9 PM THE NEXT DAY, KEEP alternating schedule  Fax me CBG readings on 07/16/13

## 2013-07-05 NOTE — Progress Notes (Signed)
Patient ID: Allison Rodgers, female   DOB: [birth year], 79 y.o.   MRN: [id number]  Eats ok at lunch  Reason for Appointment : Followup for Type 2 Diabetes  History of Present llness          Diagnosis: Type 2 diabetes mellitus, date of diagnosis: ? 2007       Past history:  Apparently she had blood sugars over 500 at the time of diagnosis She was started on insulin at the time of diagnosis and has been continuing on this with various regimens Also her weight has been previously significantly more, about 200 pounds  Recent history: The patient is  at a  nursing home now.  When she was admitted for acute renal failure she had hypoglycemia and her insulin was stopped Previously however she was taking Lantus twice a day, total daily dose 32 units with still some high readings She has had difficulty with appetite and food intake but has been doing better now; she thinks she is eating fairly good at lunch. Also has been started on Glucerna 3 times a day by her PCP On her last visit she was started on 5 units of Lantus because of mild increase in blood sugars not controlled with Januvia. No followup blood sugars were faxed from the nursing home as requested She continues on low-dose Januvia, has had renal insufficiency She now appears to be having much higher blood sugars at least in the last 10 days which are getting progressively higher. Blood sugars are being monitored in the morning and bedtime by the nursing home   INSULIN regimen: Lantus 5 units daily  Glucose monitoring:  done 2 times  a day.   Glucometer: ?     Blood Glucose readings in the morning: Since 06/26/13:165-312 with most readings over 200 Bedtime readings recently 212-307 Previously her blood sugars were running mostly around 1:30-190 Hypoglycemia frequency: None recently             Meals: 3 meals per day.  poor appetite    Physical activity: exercise: not able to walk much Dietician consultations: Several years ago              Retinal exam: Most recent:.2014    Wt Readings from Last 3 Encounters:  07/05/13 148 lb (67.132 kg)  06/22/13 150 lb 6.4 oz (68.221 kg)  06/07/13 160 lb (72.576 kg)   LABS:  Lab Results  Component Value Date   HGBA1C 6.3 06/07/2013   HGBA1C 6.2* 05/30/2013   HGBA1C 9.9* 03/24/2013   Lab Results  Component Value Date   MICROALBUR 1.5 08/28/2010   LDLCALC 86 06/07/2013   CREATININE 2.6* 06/07/2013   CREATININE 2.6* 06/07/2013       Medication List       This list is accurate as of: 07/05/13 11:27 AM.  Always use your most recent med list.               acetaminophen 500 MG tablet  Commonly known as:  TYLENOL  500-1,000 mg every 6 (six) hours as needed for pain. Per bottle as needed for pain     ALPRAZolam 0.25 MG tablet  Commonly known as:  XANAX  Take 1 tablet (0.25 mg total) by mouth every 6 (six) hours as needed for anxiety.     aspirin 81 MG chewable tablet  Chew 81 mg by mouth daily.     CALTRATE 600 PO  Take 600 mg by mouth.     chlorpheniramine  4 MG tablet  Commonly known as:  CHLOR-TRIMETON  Take 4 mg by mouth 2 (two) times daily as needed (for nasal drip, drainage).     dorzolamide 2 % ophthalmic solution  Commonly known as:  TRUSOPT  Place 1 drop into both eyes at bedtime.     fluocinonide cream 0.05 %  Commonly known as:  LIDEX  Apply 1 application topically 2 (two) times daily as needed (rash).     furosemide 20 MG tablet  Commonly known as:  LASIX  Take 1 tablet (20 mg total) by mouth daily.     guaiFENesin-dextromethorphan 100-10 MG/5ML syrup  Commonly known as:  ROBITUSSIN DM  Take 5 mLs by mouth 3 (three) times daily as needed for cough.     insulin glargine 100 UNIT/ML injection  Commonly known as:  LANTUS  Inject 0.05 mLs (5 Units total) into the skin daily.     oxyCODONE-acetaminophen 5-325 MG per tablet  Commonly known as:  PERCOCET  Take 1 tablet by mouth every 6 (six) hours as needed for pain.     pantoprazole 40 MG  tablet  Commonly known as:  PROTONIX  Take 1 tablet (40 mg total) by mouth 2 (two) times daily.     sitaGLIPtin 25 MG tablet  Commonly known as:  JANUVIA  Take 1 tablet (25 mg total) by mouth daily.     sodium chloride 0.65 % nasal spray  Commonly known as:  OCEAN  Place 2 sprays into the nose as needed for congestion.     spironolactone 100 MG tablet  Commonly known as:  ALDACTONE  Take 1 tablet (100 mg total) by mouth 2 (two) times daily.     XALATAN 0.005 % ophthalmic solution  Generic drug:  latanoprost  1 drop at bedtime.        Allergies:  Allergies  Allergen Reactions  . Penicillins Other (See Comments)    REACTION: causes hallucinations  . Sulfamethoxazole-Trimethoprim Swelling    REACTION: rash  . Bactrim     Unknown per mar  . Trimethoprim     Unknown per mar     Past Medical History  Diagnosis Date  . GERD (gastroesophageal reflux disease)   . Chronic abdominal pain   . DJD (degenerative joint disease)   . Hypertensive cardiovascular disease   . Obesity   . Gastritis, chronic   . 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 Esophageal stricture   . Hiatal hernia   . Hx of adenomatous colonic polyps   . Diverticulosis   . Dyslipidemia   . Arthritis   . DM (diabetes mellitus)   . Hypertension   . Microcytic anemia   . Edema   . Renal disorder     Past Surgical History  Procedure Laterality Date  . Appendectomy    . Cholecystectomy  1999  . Vesicovaginal fistula closure w/ tah  1957  . Bilateral oophorectomy  1957  . Hernia repair  08/2005    umbilical and ventral hernia repair by Dr. Jamey Ripa  . Rotator cuff repair  7/05    right  . Back surgery      Family History  Problem Relation Age of Onset  . Stroke Mother   . Cancer Father   . Hypertension Other     Social History:  reports that she has never smoked. She has never used smokeless tobacco. She reports that she does not drink alcohol or use illicit  drugs.    Review of Systems       Lipids: She is currently not on any medications for this   She is still taking diuretics including spironolactone   Physical Examination:  BP 128/82  Pulse 115  Temp(Src) 98.6 F (37 C)  Resp 12  Ht 5' (1.524 m)  Wt 148 lb (67.132 kg)  BMI 28.90 kg/m2  SpO2 98%     ASSESSMENT:  Diabetes type 2  Her blood sugars are markedly increased possibly from adding Glucerna and improved food intake. Her A1c has been relatively low possibly because of renal failure and anemia Her diet is reasonably good, still has a relatively small portions. She has always been insulin-dependent and will need to continue adjusting her dose based on blood sugar patterns  PLAN:   Lantus 10 units twice a day and then gradually increase if needed She can continue Januvia 25 mg to help with postprandial hyperglycemia Her readings will be reviewed next week by fax   [facility] 07/05/2013, 11:27 AM

## 2013-07-09 ENCOUNTER — Telehealth: Payer: Self-pay | Admitting: Internal Medicine

## 2013-07-09 NOTE — Telephone Encounter (Signed)
Spoke with CJ at [facility]  She states that the pt is still having dysphagia, and therefore not eating much  She wants VO for pt to attend out pt PT and speech therapy x 5 wks to help with this  Please advise if this is okay thanks!

## 2013-07-09 NOTE — Telephone Encounter (Signed)
Spoke with Allison Rodgers and gave VO  She verbalized understanding, nothing further needed

## 2013-07-09 NOTE — Telephone Encounter (Signed)
ok 

## 2013-07-13 ENCOUNTER — Encounter: Payer: Self-pay | Admitting: Endocrinology

## 2013-07-13 ENCOUNTER — Ambulatory Visit (INDEPENDENT_AMBULATORY_CARE_PROVIDER_SITE_OTHER): Payer: Medicare PPO | Admitting: Internal Medicine

## 2013-07-13 ENCOUNTER — Other Ambulatory Visit (INDEPENDENT_AMBULATORY_CARE_PROVIDER_SITE_OTHER): Payer: Medicare PPO

## 2013-07-13 ENCOUNTER — Encounter: Payer: Self-pay | Admitting: Internal Medicine

## 2013-07-13 VITALS — BP 122/70 | HR 92 | Temp 98.3°F | Ht 64.0 in | Wt 149.0 lb

## 2013-07-13 DIAGNOSIS — IMO0001 Reserved for inherently not codable concepts without codable children: Secondary | ICD-10-CM

## 2013-07-13 DIAGNOSIS — F0390 Unspecified dementia without behavioral disturbance: Secondary | ICD-10-CM

## 2013-07-13 DIAGNOSIS — R109 Unspecified abdominal pain: Secondary | ICD-10-CM

## 2013-07-13 DIAGNOSIS — N184 Chronic kidney disease, stage 4 (severe): Secondary | ICD-10-CM

## 2013-07-13 LAB — BASIC METABOLIC PANEL
BUN: 39 mg/dL — ABNORMAL HIGH (ref 6–23)
CO2: 28 mEq/L (ref 19–32)
Glucose, Bld: 197 mg/dL — ABNORMAL HIGH (ref 70–99)
Potassium: 4.5 mEq/L (ref 3.5–5.1)
Sodium: 131 mEq/L — ABNORMAL LOW (ref 135–145)

## 2013-07-13 LAB — CBC WITH DIFFERENTIAL/PLATELET
Basophils Absolute: 0.1 10*3/uL (ref 0.0–0.1)
Eosinophils Absolute: 0 10*3/uL (ref 0.0–0.7)
HCT: 44.4 % (ref 36.0–46.0)
Hemoglobin: 14.2 g/dL (ref 12.0–15.0)
Lymphs Abs: 2.3 10*3/uL (ref 0.7–4.0)
MCHC: 31.9 g/dL (ref 30.0–36.0)
MCV: 74.6 fl — ABNORMAL LOW (ref 78.0–100.0)
Monocytes Absolute: 0.9 10*3/uL (ref 0.1–1.0)
Neutro Abs: 5.6 10*3/uL (ref 1.4–7.7)
Platelets: 212 10*3/uL (ref 150.0–400.0)
RDW: 14.3 % (ref 11.5–14.6)
WBC: 8.9 10*3/uL (ref 4.5–10.5)

## 2013-07-13 NOTE — Patient Instructions (Addendum)
Please see patient coordinator before you leave today  to schedule GI eval for abd pain vomitng  and weight loss   Please remember to go to the lab  department downstairs for your tests - we will call you with the results when they are available  Please schedule a follow up office visit in 6 weeks, call sooner if needed to see Tammy NP on return

## 2013-07-13 NOTE — Progress Notes (Signed)
Subjective:     Patient ID: Allison Rodgers, female   DOB: [birth year]   MRN: [id number]  Brief patient profile:  66  yobf with morbid obesity complicated by DM, HTN and Dyslipidemia and chronic venous insufficiency with dependent edema.    August 28, 2010 cpx getting all better. limited by legs/hip/ knees no sob or increase leg swelling. >>labs done w/no rx changes   September 11, 2010 --Pt here for follow and med calendar. Last ov with CPX. Labs were w/no significant changes. A1C at 6.7 , s.CR holding at 1.4 . We reviewed all her labs in detail.Urine did show microscopic hematuria . Discussed a DM and low cholestrol diet. She is recovering from a prolonged bronchitis-feeling better but not back to baseline. She has clear drainge and energy level is low. We reviewed all her meds and organized her med calendar.  rec follow calendar   05/10/2011. Follow up and med review.  Seen 2 weeks ago for follow up . Labs showed BS elevated at 300 nonfasting.  Disucssed diet and exercise. Weight is going down with dietary changes.  Will check A1C today to evaluate average control.   We reviewed all her meds today and updated her med calendar w/ pt education  Appears she is taking her meds correctly.   Complains that she has seen small specks of red blood on tissues with wiping after bowel movement   Last colonoscopy was 2009.  No abdominal pain or n/v/d.  rec Follow med calendar closely and bring to each visit.  Use Preparation H Hemorrhoid cream after bowel movement  Low sweet diet.   Flu shot today   07/26/2011 Allison Rodgers/ ov cc Pt c/o increased edema "from the hips down" x 2 months - taking an extra furosemide as per calendar "maybe once a week" with no benefit (calendar clearly says take one extra daily if needed for swelling).  Denies orthopnea. [place]ed [place] to office on day of ov s doe. Denies salt excess and takes her maintenance doses of furosemide and spironlactone as per calendar  instructions rec Change the as needed furosemide to 40 mg 2 extra daily if needed (can use up to 2 extra daily or a total of 4) Watch the salt and salty food  08/23/2011 f/u ov/Allison Rodgers cc no better swelling in legs on max doses of furosemide as per calendar, new  Bifrontal L > R bandlike pressure  HA typically after lunch daily x sev weeks, ? viz Floaters not directely related to ha with no nausea  Or sinus complaints, better p tylenol and never in ams rec Please see patient coordinator before you leave today  to schedule venous dopplers> neg bilateral 08/27/11 Stop all caffeine - pay attention to the chronologic pattern of the headache and try alprazolam to see if it helps and call if worsens See [name] for arthritis > did not go     11/25/2011 f/u ov/[name] for multiple chronic medical problems cc worse arthritis esp R back and leg x one year, swelling is some better on present rx reviewed with her on med calendar. No sob, cough. Better control of leg swelling, no overt polydypsia, polyuria, viz changes rec Be sure to see Dr Gladstone Lighter about your back and leg pain Please remember to go to the lab  department downstairs for your tests - we will call you with the results when they are available.   03/02/2012 f/u ov/Allison Rodgers not using med calendar cc  c/o having slight  HA x 3 months and states "feels bad and weak all of the time". Sugars fasting running as high has 300 fasting on glimepririede 2 mg one half each am, persistent leg swelling despite furosemide and lasix. Admits getting very forgetful re details of care and trying to use the alphabetized epic list for med rec purposes.  HA is generalized, in pms, better with tylenol with no assoc viz or neuro c/os or nausea, always absent in ams >>Change the furosemide to 2 in am and at lunch with additonal 2 at supper if needed  Increase Glimperide to 2 mg each am   03/18/2012 Follow up and MMSE /Med calendar  We reviewed all her meds and organized  them into a med calendar with pt education  She has a lot of old bottles that she says she keeps and put new meds into old bottles  Advised this is not a good idea.  Says her memory is slipping. Gets anxious when she can not remember things. Has gotten lost a couple of times. Son in [place]. She is considering going to live with him or him come here.  Does not want to go to NH.  MMSE scored a 29/30 but slow responses.  Clock draw was very abnormal. With hands of clock outside clock.  We dicussed referral to neuro due to concern for possible dementia  She refuses to go to neuro wants to be treated here.  Labs were unremarkable with neg RPR , nml tsh and nml b12 She denies headache, visual/speech changes.  Does have anxiety at times. No significant depression like symptoms.  Sugars has been running 200-250 . Amaryl was increased 2 weeks ago w/ A1C at 8.4  No low sugars.    04/15/2012 f/u ov/Allison Rodgers confused with instructions, meds, "son took her list back to [place]" felt worse x 2 weeks with weak and dizziness but no specific pattern (not related to meals).  Fasting bs remain in low 200s  >med review   04/29/12 Follow up and med review     [name] Her last visit. Patient was having difficulty remembering her medications and had misplaced her medication calendar. She is brought all of her medications in today. We reviewed all her medications updated. Her medication calendar with patient education. It does appear that all her medications are up-to-date and have been refilled on time, according to the medication bottles She does easily get confused if you cannot slow down and go over the medications and with slowly We discussed the importance of keeping her list up to date and keep it with her at all times especially when she is seen at the doctor's office Blood sugars are slowly decreasing with her medication adjustments recently She has  had no low sugars She denies any chest pain, shortness of breath, abdominal pain, or increased edema We discussed once again about her memory and referral to neurology. However, she declined Referral  05/28/2012 Acute OV  Complains of right back and hip pain.  pt fell at home 05-25-12, right hip and groin area/right elbow are sore.   reports lost her balance and fell forward landing on right side.  She was able to get herself, and ambulates with minimal pain. Over the last 2 days, has been progressively worsening over and tender along the right side of her lower back into her right hip and right groin area, especially with walking. She denies any urinary symptoms, chest pain, loss of  consciousness, visual or speech changes, extremity weakness, radicular symptoms, or leg swelling >xray w/ no acute process   06/08/2012 Follow up  Returns for follow up for hip and back pain, seen 2 weeks ago after a fall.  Xrays showed no fracture.  Feeling better but still some residual soreness.  Does have some drainage and dry cough for 2 weeks.  rec Alternate ice and heat to back and hip  May use Tylenol arthritis for pain As needed   IF severe pain may use Roxicet As needed  -may cause you to be sleepy and off balance.   06/18/2012 f/u ov/Chrishelle Zito cc  Hips/ back better, no need for narcotic rx.  Memory no better on aricept and pt wants to stop.  Seems to using med calendar well. rec Stop fluticasone and the aricept Dymista one twice daily GERD .     09/16/2012 f/u ov/Alexandrina Fiorini cc pt states having increase sob, wheezing when moving and sitting still. along with heart palp. x 49month - seems worse around 4pm and better p supper >>RAST + /IGE ~319 , CT sinus neg   09/29/2012 Follow u p Pt returns for a 2 week follow up and med review  Her son from [place] is in town. He is trying to help her with her meds  We organized all her meds and updated her med calendar w/ pt education  She has a couple of old/expired  bottles -it is unclear if she is putting new meds into old bottles or they are expired.  She does admit she does not take her fluid pills as recommended.  I emphasized the importance of med compliance.  Son is concerned regarding memory . She does have mild forgetfullness but mostly seems to not process information well sometimes.  Repeated MMSE exam today. Tested well w/ good flow at 30/30 . Clock draw was slightly abn as she wrote the time instead of the hands of clock.   Previous MMSE 03/2012 29/30 . rpr /b12 nml 8/13 .  Labs last ov showed A1C tr up at 8.4.  No polyuria/polydipsia.  Previously started on Aricept 03/2012 . Stoppled 06/2012   12/22/2012 f/u ov/Hillman Attig re dm/hbp using calendar well  Chief Complaint  Patient presents with  . Follow-up    She c/o numbness and pain in her right arm since Sept 2013. She has been having a hard time picking things up. She also c/o continuing to have black stools and vomiting several times per month.  vomiting sev x per months, assoc with nausea,  black stools daily, no abd pain, maintaining on asa and ppi/hs h2. No wt loss.  Not clear whether has arm/ hand are really weak or numb or just weak due to pain when gripping, persistent daily symptoms snc Sept 2013.  >>referral to GI and Neuro   03/24/2013 Follow up  She returns for a follow up office visit.  Seen by Dr. Sharlett Iles earlier this year in May  Underwent endoscopy 12/30/12 that was unremarkable except for H Pylori .  Tx w/ PYLERA therapy. She complains that stomach still upset on/off and dark stools on /off. Last colonscopy 2009 with severe diverticular dz. HBG in 12/2012 was Nml.  Was suppose to follow up with Neurology however appointment was changed .  Continues to have memory issues.  Son is with her today but is leaving to go back home.  She is leaving alone . We went over meds it is obvious that she is unclear of what she  is taking and gets confused easily.  BS at home 100-200 . Does have  polyuria.  No fever, chest pain, dyspnea, edema.  ? taking Mobic on/off.  >>refer to GI , d/c mobic   05/05/13 Post Hospital follow up  Admitted 8/19-25 for acute encephalopathy with DKA , possible Mennigitis Bacterial vs Viral . Tx with abx and antivirals. CT head with mod atrophy, no acute finding.  DM meds were adjusted.  Discharged to rehab center.  Returns today with son, feeling better. Has been undergoing rehab with PT /OT. Getting stronger but still weak.  Is going to transfer this week to Assisted Living.  Has been referred to Endocrinology with Dr Lucianne Muss for DM management.-Lantus was changed to 18 u in am and 14 u in pm. Amaryl/Januvia were stopped.   Was referred back to Neuro for dementia - felt aricept +/- namenda may be indicated but will hold off for now d/t recent acute illness. Leave to PCP for decision in future.  >>no changes   06/22/2013 Post Hospital follow up  Recently admitted for dehydration and FTT complicated by acute on chronic renal failure.  Improved with IVF hydration.  Wt down 18lbs since last ov.  Not eating. Feels like everything tastes bad. Has seen Dr. Jarold Motto in GI for decreased appetite , wt loss, and diverticulitis . Tx w/ flagyl and cipro . No improvement.  Insulin stopped in hospital due to hypoglycemia and started on januvia.  Was seen by endrocrinology last week. Insulin restated with dose adjusted by Dr. Lucianne Muss.  Labs last ov with Dr. Lucianne Muss scr tr up 2.6. Denies vomiting or diarrhea, fever, leg swelling , bloody stools , urinary symptoms. No worsening of memory or behavior issues.  Does feel anxious a lot . Denies depression or cyring episodes.  Feels she is more active , working with PT/OT/speech.  At [facility] rec Discontinue Percocet , Calcium  And Chlortrimeton.  Decrease Spironolactone 25mg  Twice daily    Begin Glucerna drinks Three times a day  .  follow up Dr. [name]  In 3 weeks and As needed    07/13/2013 f/u ov/Meagan Spease re: wt loss Chief  Complaint  Patient presents with  . Follow-up    Breathing doing well and cough.  Over all up and down, good and bad days  does not where she lives but knows date, year, Thxgiving last week.  Has chronic constipation and diffuse abd pain 24/7 not worse eating, not better p flatus or belching, present x years, worse x months.  occ nausea with vomiting.  No obvious day to day or daytime variabilty or assoc   cp or chest tightness, subjective wheeze overt sinus or hb symptoms. No unusual exp hx or h/o childhood pna/ asthma or knowledge of premature birth.  Sleeping ok without nocturnal  or early am exacerbation  of respiratory  c/o's or need for noct saba. Also denies any obvious fluctuation of symptoms with weather or environmental changes or other aggravating or alleviating factors except as outlined above   Current Medications, Allergies, Complete Past Medical History, Past Surgical History, Family History, and Social History were reviewed in [employer] record.  ROS  The following are not active complaints unless bolded sore throat, dysphagia, dental problems, itching, sneezing,  nasal congestion or excess/ purulent secretions, ear ache,   fever, chills, sweats, unintended wt loss, pleuritic or exertional cp, hemoptysis,  orthopnea pnd or leg swelling, presyncope, palpitations, heartburn, abdominal pain, anorexia, nausea, vomiting, diarrhea  or change  in bowel or urinary habits, change in stools or urine, dysuria,hematuria,  rash, arthralgias, visual complaints, headache, numbness weakness or ataxia or problems with walking or coordination,  change in mood/affect or memory.                        Family History:   asthma in her brother who also has hypertension  mother died of stroke, also had diabetes and arthritis  father had multiple myeloma  4 siblings all in relatively good health  Neg for dementia     Social History:  Patient never smoked.   positive for second-hand smoke exposure  exercises 2-3 times a week  1 cup caffeine a day  married  1 child       Past Medical History:  GERD (ICD-530.81)  ABDOMINAL PAIN, CHRONIC (ICD-789.00)  FATIGUE, CHRONIC (ICD-780.79)  DEGENERATIVE JOINT DISEASE (ICD-715.90)  - L5 radiculopathy confirmed by mri 08/2005  --refer to ortho August 24, 2008 >>> Dr Darrelyn Hillock  HYPERTENSIVE CARDIOVASCULAR DISEASE (ICD-402.90)  OBESITY (ICD-278.00)  - Target wt = 158 for BMI < 30  - Referred [facility] 04/2005  GASTRITIS, CHRONIC (ICD-535.10)  BENIGN NEOPLASM OTH&UNSPEC SITE DIGESTIVE SYSTEM (ICD-211.9)  ESOPHAGEAL STRICTURE (ICD-530.3)..............................................Marland KitchenPatterson  - EGD 12/14/07  HIATAL HERNIA (ICD-553.3)  COLONIC POLYPS, ADENOMATOUS (ICD-211.3)  - Colonoscopy 12/14/07  DIVERTICULOSIS, COLON (ICD-562.10)  DYSLIPIDEMIA (ICD-272.4)  ARTHRITIS (ICD-716.90)  DM (ICD-250.00)  HYPERTENSION (ICD-401.9)  HEALTH MAINTENANCE  - dt 03/2003  - Pneumovax 03/2003  - CPX August 28, 2010  -Mammogram 10/2009, ? abn asymmetry >>repeat ok, last mammo 4/13 >neg  MICROCYTIC ANEMA  - DX 02/03/09, Stool g neg x 6, rx Fe 65 mg /day > improving March 17, 2009  Edema--08/10/08--venous doppler neg for dvt, ?ruptured bakers cyst-right  March 20, 2009 repeat venous doppler > neg bilaterally  May 21, 2010 repeat venous dopplers >> neg bilat  Complex med regimen>>Meds reviewed with pt education and computerized med calendar September 11, 2010 , 05/10/2011 , 03/18/2012 . 04/29/12 Memory loss  MMSE 03/18/2012 > 29/30 , Clock Draw abn , neg RPR , nml b12 , 09/29/2012 >30/30 , clock abn >refer to neuro  Probable dementia, rx held due to multiple co-morbidities  09/29/2012 RAST + w/ IGE at 319        Objective:   Physical Exam obese ambulatory bf nad Partial rolling walker  wt 213 10/21/08 > 204 April 26, 2009 > 08/23/2011  191> 11/25/2011  201 > 03/02/2012  193 > 198 03/18/2012 > 186 04/15/2012 >191  05/28/2012 >184 06/08/2012 > 06/18/2012 186 > 189 09/16/2012 > 12/22/2012  198 > 183 03/24/2013  >168 05/05/13 >150 06/22/2013  > 07/13/2013   149  HEENT: nl dentition, turbinates, and orophanx. Nl external ear canals without cough reflex  Neck without JVD/Nodes/TM  Lungs clear to A and P bilaterally without cough on insp or exp maneuvers  RRR no s3 or murmur or increase in P2,tr edema   Abd soft min distended  and benign with nl excursion in the supine position. No bruits or organomegaly   EXT/Skin  :  venous insufficiency changes , no rash  Neuro:  No focal motor deficits , alert to person and place.         Assessment:

## 2013-07-14 NOTE — Assessment & Plan Note (Signed)
All calls referred to Dr Lucianne Muss,  Cautioned that if po intake changes related to abdominal complaints then will need to have dm meds adjusted accordingly

## 2013-07-14 NOTE — Assessment & Plan Note (Signed)
Lab Results  Component Value Date   CREATININE 2.7* 07/13/2013   CREATININE 2.6* 06/07/2013   CREATININE 2.6* 06/07/2013   CREATININE 1.75* 06/01/2013     Poor candidate for HD, vol status about nl so no change in RX

## 2013-07-14 NOTE — Progress Notes (Signed)
Quick Note:  Spoke with pt's sonand notified of results per Dr. Wert. He verbalized understanding and denied any questions.  ______ 

## 2013-07-14 NOTE — Assessment & Plan Note (Signed)
Appears chronic but assoc with wt loss and occ vomiting ? Gastroparesis / ? Functional constipation > rec sorbitol 70% 2 tbsp daily prn pending gi f/u requested

## 2013-07-14 NOTE — Assessment & Plan Note (Addendum)
Does not appear to be any worse than baselin > F/u neurology planned

## 2013-07-16 ENCOUNTER — Telehealth: Payer: Self-pay | Admitting: Endocrinology

## 2013-07-16 NOTE — Telephone Encounter (Signed)
Blood sugars recently after starting Lantus 10 units twice a day: Morning readings are about 170-180 and the rest of the day they are in the low 200 range Ordered faxed for increasing the morning dose to 14 units

## 2013-07-20 ENCOUNTER — Encounter: Payer: Self-pay | Admitting: Gastroenterology

## 2013-07-20 ENCOUNTER — Ambulatory Visit (INDEPENDENT_AMBULATORY_CARE_PROVIDER_SITE_OTHER): Payer: Medicare PPO | Admitting: Gastroenterology

## 2013-07-20 VITALS — BP 108/68 | HR 86 | Ht 60.0 in | Wt 151.2 lb

## 2013-07-20 DIAGNOSIS — R109 Unspecified abdominal pain: Secondary | ICD-10-CM

## 2013-07-20 DIAGNOSIS — E119 Type 2 diabetes mellitus without complications: Secondary | ICD-10-CM

## 2013-07-20 DIAGNOSIS — R1110 Vomiting, unspecified: Secondary | ICD-10-CM

## 2013-07-20 DIAGNOSIS — R634 Abnormal weight loss: Secondary | ICD-10-CM

## 2013-07-20 NOTE — Patient Instructions (Signed)
You have been scheduled for a gastric emptying scan at [facility] on 08-04-2013 at 930 am. Please arrive at least 15 minutes prior to your appointment for registration. Please make certain not to have anything to eat or drink after midnight the night before your test. Hold all stomach medications (ex: Zofran, phenergan, Reglan) 48 hours prior to your test. If you need to reschedule your appointment, please contact radiology scheduling at [phone]. _____________________________________________________________________ A gastric-emptying study measures how long it takes for food to move through your stomach. There are several ways to measure stomach emptying. In the most common test, you eat food that contains a small amount of radioactive material. A scanner that detects the movement of the radioactive material is placed over your abdomen to monitor the rate at which food leaves your stomach. This test normally takes about 2 hours to complete. _____________________________________________________________________

## 2013-07-20 NOTE — Progress Notes (Signed)
This is a complicated 79 year old African American female with mild dementia accompanied by her son.  She has severe sigmoid colon diverticulosis which is been managed for many years with high fiber diets and bulking agents.  She currently has vague abdominal discomfort but denies significant left lower quadrant pain, melena or hematochezia.  She continues with feeding difficulties with intermittent regurgitation.  Endoscopy in May was unremarkable, and treatment for H. pylori.  Did not improve any of her symptoms.  She is chronically on pantoprazole 40 mg twice a day for acid reflux.  Her main complaint is early satiety and some regurgitation, and she is an insulin-dependent diabetic managed by Dr. Lucianne Muss.  Her last colonosco of 2009, last endoscopy of May 2014.  Her son gives most of her history, and he says that her bowels are doing fairly well and she within the last several weeks has had improvement in her general sense of well-being and general medical status.  This perhaps is related to better diabetic control.  He confirmed that she denies severe lower abdominal pain, melena or hematochezia.  Her labs show mild chronic renal insufficiency but otherwise are unremarkable with a normal CBC a normal hemoglobin of 14.    Current Medications, Allergies, Past Medical History, Past Surgical History, Family History and Social History were reviewed in [employer] record.  ROS: All systems were reviewed and are neg   ative unless otherwise stated in the HPI.   Allergies  Allergen Reactions  . Penicillins Other (See Comments)    REACTION: causes hallucinations  . Sulfamethoxazole-Trimethoprim Swelling    REACTION: rash  . Bactrim     Unknown per mar  . Trimethoprim     Unknown per mar    Outpatient Prescriptions Prior to Visit  Medication Sig Dispense Refill  . acetaminophen (TYLENOL) 500 MG tablet 500-1,000 mg every 6 (six) hours as needed for pain. Per bottle as needed for  pain      . ALPRAZolam (XANAX) 0.25 MG tablet Take 1 tablet (0.25 mg total) by mouth every 6 (six) hours as needed for anxiety.  30 tablet  0  . aspirin 81 MG chewable tablet Chew 81 mg by mouth daily.        . dorzolamide (TRUSOPT) 2 % ophthalmic solution Place 1 drop into both eyes at bedtime.      . furosemide (LASIX) 20 MG tablet Take 1 tablet (20 mg total) by mouth daily.  30 tablet  1  . guaiFENesin-dextromethorphan (ROBITUSSIN DM) 100-10 MG/5ML syrup Take 5 mLs by mouth 3 (three) times daily as needed for cough.      . latanoprost (XALATAN) 0.005 % ophthalmic solution 1 drop at bedtime.      . pantoprazole (PROTONIX) 40 MG tablet Take 1 tablet (40 mg total) by mouth 2 (two) times daily.  60 tablet  0  . sitaGLIPtin (JANUVIA) 25 MG tablet Take 1 tablet (25 mg total) by mouth daily.  30 tablet  1  . sodium chloride (OCEAN) 0.65 % nasal spray Place 2 sprays into the nose as needed for congestion.      Marland Kitchen spironolactone (ALDACTONE) 100 MG tablet Take 1 tablet (100 mg total) by mouth 2 (two) times daily.  60 tablet  0  . insulin glargine (LANTUS) 100 UNIT/ML injection Inject 0.05 mLs (5 Units total) into the skin daily.  10 mL  1  . Calcium Carbonate (CALTRATE 600 PO) Take 600 mg by mouth.      Marland Kitchen  chlorpheniramine (CHLOR-TRIMETON) 4 MG tablet Take 4 mg by mouth 2 (two) times daily as needed (for nasal drip, drainage).      . fluocinonide cream (LIDEX) 0.05 % Apply 1 application topically 2 (two) times daily as needed (rash).      Marland Kitchen oxyCODONE-acetaminophen (PERCOCET) 5-325 MG per tablet Take 1 tablet by mouth every 6 (six) hours as needed for pain.  30 tablet  0   No facility-administered medications prior to visit.   Past Medical History  Diagnosis Date  . GERD (gastroesophageal reflux disease)   . Chronic abdominal pain   . DJD (degenerative joint disease)   . Hypertensive cardiovascular disease   . Obesity   . Gastritis, chronic   . Benign neoplasm of other and unspecified site of the  digestive system   . Esophageal stricture   . Hiatal hernia   . Hx of adenomatous colonic polyps   . Diverticulosis   . Dyslipidemia   . Arthritis   . DM (diabetes mellitus)   . Hypertension   . Microcytic anemia   . Edema   . Renal disorder   . Bacterial meningitis    Past Surgical History  Procedure Laterality Date  . Appendectomy    . Cholecystectomy  1999  . Vesicovaginal fistula closure w/ tah  1957  . Bilateral oophorectomy  1957  . Hernia repair  08/2005    umbilical and ventral hernia repair by Dr. Jamey Ripa  . Rotator cuff repair  7/05    right  . Back surgery     History   Social History  . Marital Status: Widowed    Spouse Name: N/A    Number of Children: 1  . Years of Education: N/A   Occupational History  . Retired    Social History Main Topics  . Smoking status: Never Smoker   . Smokeless tobacco: Never Used  . Alcohol Use: No  . Drug Use: No  . Sexual Activity: None   Other Topics Concern  . None   Social History Narrative   Pt lives at home alone. 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 She drinks 1 cup of caffeine per month and rarely drinks sodas.    Family History  Problem Relation Age of Onset  . Stroke Mother   . Cancer Father   . Hypertension Other          Physical Elderly patient in no acute distress appearing as well as I have seen her in several years.  Blood pressure 108/68, pulse 88 and regular, and weight 151.  Patient is oriented x3.  Abdominal exam shows no distention, organomegaly, masses, but there is some tenderness to deep palpation the left lower quadrant area.  There is no rebound tenderness.  Bowel sounds are normal I cannot appreciate a succussion splash.    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  However, the patient denies any passage of stool or air from her vagina, and is  fairly asymptomatic in terms of her diverticular disease despite some tenderness on exam.  She has a normal hemoglobin, and I see no need for repeat colonoscopy at this time.  If she has a vaginal fistula, it is asymptomatic at this time.  Her main complaint seems to be one of intermittent emesis, and she may have mild gastroparesis associated with her insulin-dependent diabetes.  I  have set her up for technetium gastric emptying scan,and she may benefit from domperidone therapy.  She is not a good candidate for metoclopramide with her CNS difficulties.  This is a difficult patient to treat because of her age and mental status, but she appears to be fairly stable at this time.  I do not think there are any symptoms or evidence of ischemic bowel problems.  Additional CT scan findings include previous hysterectomy and a somewhat atrophic pancreas.  There no results of her emptying scan she may need a trial of pancreatic extract therapy.  I've informed her and her son today that I will be retiring soon, and I will transfer her to the care of Dr.Jay Pyrtle.  CC: Dr Drinda Butts and Dr. Francella Solian

## 2013-08-04 ENCOUNTER — Encounter (HOSPITAL_COMMUNITY)
Admission: RE | Admit: 2013-08-04 | Discharge: 2013-08-04 | Disposition: A | Discharge status: Home / Self Care | Payer: Medicare PPO | Source: Ambulatory Visit | Attending: Gastroenterology | Admitting: Gastroenterology

## 2013-08-04 DIAGNOSIS — R109 Unspecified abdominal pain: Secondary | ICD-10-CM | POA: Insufficient documentation

## 2013-08-04 MED ORDER — TECHNETIUM TC 99M SULFUR COLLOID
2.2000 | Freq: Once | INTRAVENOUS | Status: AC | PRN
  Administered 2013-08-04: 2.2 via ORAL

## 2013-08-13 ENCOUNTER — Telehealth: Payer: Self-pay | Admitting: Adult Health

## 2013-08-13 NOTE — Telephone Encounter (Signed)
These forms have been placed on TP desk and i will forward message to Janett Billow jones to follow up with.

## 2013-08-16 ENCOUNTER — Encounter: Payer: Self-pay | Admitting: Endocrinology

## 2013-08-16 ENCOUNTER — Other Ambulatory Visit (INDEPENDENT_AMBULATORY_CARE_PROVIDER_SITE_OTHER): Payer: Medicare PPO

## 2013-08-16 ENCOUNTER — Ambulatory Visit (INDEPENDENT_AMBULATORY_CARE_PROVIDER_SITE_OTHER): Payer: Medicare PPO | Admitting: Endocrinology

## 2013-08-16 VITALS — BP 120/58 | HR 80 | Temp 98.2°F | Resp 12 | Ht 60.0 in | Wt 157.7 lb

## 2013-08-16 DIAGNOSIS — E1129 Type 2 diabetes mellitus with other diabetic kidney complication: Secondary | ICD-10-CM

## 2013-08-16 DIAGNOSIS — E1165 Type 2 diabetes mellitus with hyperglycemia: Principal | ICD-10-CM

## 2013-08-16 DIAGNOSIS — E1149 Type 2 diabetes mellitus with other diabetic neurological complication: Secondary | ICD-10-CM

## 2013-08-16 LAB — BASIC METABOLIC PANEL
BUN: 24 mg/dL — AB (ref 6–23)
CHLORIDE: 104 meq/L (ref 96–112)
CO2: 28 mEq/L (ref 19–32)
Calcium: 9.1 mg/dL (ref 8.4–10.5)
Creatinine, Ser: 2 mg/dL — ABNORMAL HIGH (ref 0.4–1.2)
GFR: 30.96 mL/min — AB (ref >60.00)
GLUCOSE: 175 mg/dL — AB (ref 70–99)
Potassium: 4 mEq/L (ref 3.5–5.1)
SODIUM: 138 meq/L (ref 135–145)

## 2013-08-16 NOTE — Progress Notes (Signed)
Patient ID: Allison Rodgers, female   DOB: 07/04/1924, 80 y.o.   MRN: [id number]   Reason for Appointment : Followup for Type 2 Diabetes  History of Present llness          Diagnosis: Type 2 diabetes mellitus, date of diagnosis: ? 2007       Past history:  Apparently she had blood sugars over 500 at the time of diagnosis She was started on insulin at the time of diagnosis and has been continuing on this with various regimens Also her weight has been previously significantly more, about 200 pounds  Recent history: The patient has been at the nursing home with occasionally visiting her home.  Apparently her appetite is improved and she is gaining weight. Also her primary care physician has put her on Glucerna 3 times a day although she does not always get 3 times a day Her blood sugars were high about a month ago and instructions were sent to increase her Lantus to 14 units in the morning but she still appears to be getting 10 units in the morning INSULIN regimen: Lantus 10 units twice daily  Glucose monitoring:  done 2 times  a day.   Glucometer: ?     Blood Glucose readings in the morning: 96-206 Lunchtime 157-203. Suppertime 133-178 and bedtime recently 157-200 Hypoglycemia frequency: None recently             Meals: 3 meals per day.   Physical activity: exercise: not able to walk much Dietician consultations: Several years ago             Retinal exam: Most recent:.2014    Wt Readings from Last 3 Encounters:  08/16/13 157 lb 11.2 oz (71.532 kg)  07/20/13 151 lb 4 oz (68.607 kg)  07/13/13 149 lb (67.586 kg)   LABS:  Lab Results  Component Value Date   HGBA1C 6.3 06/07/2013   HGBA1C 6.2* 05/30/2013   HGBA1C 9.9* 03/24/2013   Lab Results  Component Value Date   MICROALBUR 1.5 08/28/2010   LDLCALC 86 06/07/2013   CREATININE 2.7* 07/13/2013       Medication List       This list is accurate as of: 08/16/13 11:09 AM.  Always use your most recent med list.                acetaminophen 500 MG tablet  Commonly known as:  TYLENOL  500-1,000 mg every 6 (six) hours as needed for pain. Per bottle as needed for pain     ALIGN 4 MG Caps  Take 1 tablet by mouth daily.     ALPRAZolam 0.25 MG tablet  Commonly known as:  XANAX  Take 1 tablet (0.25 mg total) by mouth every 6 (six) hours as needed for anxiety.     aspirin 81 MG chewable tablet  Chew 81 mg by mouth daily.     dorzolamide 2 % ophthalmic solution  Commonly known as:  TRUSOPT  Place 1 drop into both eyes at bedtime.     fluocinonide cream 0.05 %  Commonly known as:  LIDEX     furosemide 20 MG tablet  Commonly known as:  LASIX  Take 1 tablet (20 mg total) by mouth daily.     GLUCERNA Liqd  Take 237 mLs by mouth 3 (three) times daily between meals.     guaiFENesin-dextromethorphan 100-10 MG/5ML syrup  Commonly known as:  ROBITUSSIN DM  Take 5 mLs by mouth 3 (three) times daily as  needed for cough.     insulin glargine 100 UNIT/ML injection  Commonly known as:  LANTUS  Inject 10 Units into the skin 2 (two) times daily.     loperamide 2 MG tablet  Commonly known as:  IMODIUM A-D  Take 2 mg by mouth daily as needed for diarrhea or loose stools.     pantoprazole 40 MG tablet  Commonly known as:  PROTONIX  Take 1 tablet (40 mg total) by mouth 2 (two) times daily.     sitaGLIPtin 25 MG tablet  Commonly known as:  JANUVIA  Take 1 tablet (25 mg total) by mouth daily.     sodium chloride 0.65 % nasal spray  Commonly known as:  OCEAN  Place 2 sprays into the nose as needed for congestion.     sorbitol 70 % solution  Take 15 mLs by mouth daily as needed.     spironolactone 100 MG tablet  Commonly known as:  ALDACTONE  Take 1 tablet (100 mg total) by mouth 2 (two) times daily.     XALATAN 0.005 % ophthalmic solution  Generic drug:  latanoprost  1 drop at bedtime.        Allergies:  Allergies  Allergen Reactions  . Penicillins Other (See Comments)    REACTION: causes  hallucinations  . Sulfamethoxazole-Trimethoprim Swelling    REACTION: rash  . Bactrim     Unknown per mar  . Trimethoprim     Unknown per mar     Past Medical History  Diagnosis Date  . GERD (gastroesophageal reflux disease)   . Chronic abdominal pain   . DJD (degenerative joint disease)   . Hypertensive cardiovascular disease   . Obesity   . Gastritis, chronic   . 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 Esophageal stricture   . Hiatal hernia   . Hx of adenomatous colonic polyps   . Diverticulosis   . Dyslipidemia   . Arthritis   . DM (diabetes mellitus)   . Hypertension   . Microcytic anemia   . Edema   . Renal disorder   . Bacterial meningitis     Past Surgical History  Procedure Laterality Date  . Appendectomy    . Cholecystectomy  1999  . Vesicovaginal fistula closure w/ tah  1957  . Bilateral oophorectomy  1957  . Hernia repair  [id number]/[id number]    umbilical and ventral hernia repair by Dr. Margot Chimes  . Rotator cuff repair  7/05    right  . Back surgery      Family History  Problem Relation Age of Onset  . Stroke Mother   . Cancer Father   . Hypertension Other     Social History:  reports that she has never smoked. She has never used smokeless tobacco. She reports that she does not drink alcohol or use illicit drugs.    Review of Systems    Lipids: She is currently not on any medications for this   She is still taking diuretics including spironolactone but has edema. Also has mild hyponatremia   Physical Examination:  BP 120/58  Pulse 80  Temp(Src) 98.2 F (36.8 C)  Resp 12  Ht 5' (1.524 m)  Wt 157 lb 11.2 oz (71.532 kg)  BMI 30.80 kg/m2  SpO2 97%     ASSESSMENT:  Diabetes type 2   She is back on insulin but the glucoses are fairly stable with using 10 units twice a day.  She did have high readings about a month ago but they are mostly below 200 nonfasting Her control is somewhat variable based on her diet and  also sometimes getting nutritional supplement With her age her blood sugar targets are more flexible  Most of her blood sugars are below 200 She is gaining weight and may need to stop her Glucerna to prevent excessive weight gain and further insulin resistance  PLAN:   Continue Lantus 10 units twice a day and may gradually increase if glucose readings are consistently over 180 She can continue Januvia 25 mg to help with postprandial hyperglycemia Her readings will be reviewed  by fax if they go up excessively, written instructions given  PCP to monitor her hyponatremia and nutritional status  Gervis Gaba 08/16/2013, 11:09 AM

## 2013-08-17 LAB — FRUCTOSAMINE: Fructosamine: 251 umol/L (ref <285)

## 2013-08-18 ENCOUNTER — Encounter: Payer: Self-pay | Admitting: Podiatry

## 2013-08-18 ENCOUNTER — Ambulatory Visit (INDEPENDENT_AMBULATORY_CARE_PROVIDER_SITE_OTHER): Payer: Medicare HMO | Admitting: Podiatry

## 2013-08-18 VITALS — BP 131/73 | HR 77 | Resp 16

## 2013-08-18 DIAGNOSIS — B351 Tinea unguium: Secondary | ICD-10-CM

## 2013-08-18 DIAGNOSIS — M79609 Pain in unspecified limb: Secondary | ICD-10-CM

## 2013-08-18 NOTE — Patient Instructions (Signed)
Patient presents for ongoing debridement of painful mycotic toenails at three-month intervals. Reappoint at 3 months

## 2013-08-18 NOTE — Progress Notes (Signed)
Patient ID: Allison Rodgers, female   DOB: Aug 01, 1924, 80 y.o.   MRN: [id number]  Subjective: Orientated x42 80 year old black female presents for ongoing debridement of painful mycotic toenails. She was last seen for this service on 05/19/2013.  Dermatological: Elongated, discolored, hypertrophic toenails x10.  Assessment: Symptomatic onychomycoses x10  Plan: Debridement of toenails x10 without a bleeding. Reappoint at three-month intervals.

## 2013-08-23 ENCOUNTER — Encounter: Payer: Self-pay | Admitting: Adult Health

## 2013-08-23 ENCOUNTER — Ambulatory Visit (INDEPENDENT_AMBULATORY_CARE_PROVIDER_SITE_OTHER): Payer: Medicare PPO | Admitting: Adult Health

## 2013-08-23 VITALS — BP 102/56 | HR 67 | Temp 97.7°F | Ht 64.0 in | Wt 162.0 lb

## 2013-08-23 DIAGNOSIS — K3184 Gastroparesis: Secondary | ICD-10-CM | POA: Insufficient documentation

## 2013-08-23 DIAGNOSIS — E1165 Type 2 diabetes mellitus with hyperglycemia: Secondary | ICD-10-CM

## 2013-08-23 DIAGNOSIS — E1129 Type 2 diabetes mellitus with other diabetic kidney complication: Secondary | ICD-10-CM

## 2013-08-23 NOTE — Assessment & Plan Note (Signed)
Improved control  Cont on current regimen  Follow up with Endocrinology as planned and As needed   Cont to monitor renal fxn may need referral to Nephrology in future

## 2013-08-23 NOTE — Patient Instructions (Addendum)
Continue with same regimen . Follow up with Dr. Dwyane Dee for your Diabetes.  Follow up Dr. Melvyn Novas  In 3 months.

## 2013-08-23 NOTE — Telephone Encounter (Signed)
Forms completed and given to pt's son at ov today w/ TP. Copy made and sent for scan into pt's chart. Will sign off.

## 2013-08-23 NOTE — Assessment & Plan Note (Signed)
Gastroparesis -  Cont on diet recs from GI

## 2013-08-23 NOTE — Progress Notes (Signed)
Subjective:     Patient ID: Allison Rodgers, female   DOB: [birth year]   MRN: [id number]  Brief patient profile:  66  yobf with morbid obesity complicated by DM, HTN and Dyslipidemia and chronic venous insufficiency with dependent edema.    August 28, 2010 cpx getting all better. limited by legs/hip/ knees no sob or increase leg swelling. >>labs done w/no rx changes   September 11, 2010 --Pt here for follow and med calendar. Last ov with CPX. Labs were w/no significant changes. A1C at 6.7 , s.CR holding at 1.4 . We reviewed all her labs in detail.Urine did show microscopic hematuria . Discussed a DM and low cholestrol diet. She is recovering from a prolonged bronchitis-feeling better but not back to baseline. She has clear drainge and energy level is low. We reviewed all her meds and organized her med calendar.  rec follow calendar   05/10/2011. Follow up and med review.  Seen 2 weeks ago for follow up . Labs showed BS elevated at 300 nonfasting.  Disucssed diet and exercise. Weight is going down with dietary changes.  Will check A1C today to evaluate average control.   We reviewed all her meds today and updated her med calendar w/ pt education  Appears she is taking her meds correctly.   Complains that she has seen small specks of red blood on tissues with wiping after bowel movement   Last colonoscopy was 2009.  No abdominal pain or n/v/d.  rec Follow med calendar closely and bring to each visit.  Use Preparation H Hemorrhoid cream after bowel movement  Low sweet diet.   Flu shot today   07/26/2011 Wert/ ov cc Pt c/o increased edema "from the hips down" x 2 months - taking an extra furosemide as per calendar "maybe once a week" with no benefit (calendar clearly says take one extra daily if needed for swelling).  Denies orthopnea. Walked [place] to office on day of ov s doe. Denies salt excess and takes her maintenance doses of furosemide and spironlactone as per calendar  instructions rec Change the as needed furosemide to 40 mg 2 extra daily if needed (can use up to 2 extra daily or a total of 4) Watch the salt and salty food  08/23/2011 f/u ov/Wert cc no better swelling in legs on max doses of furosemide as per calendar, new  Bifrontal L > R bandlike pressure  HA typically after lunch daily x sev weeks, ? viz Floaters not directely related to ha with no nausea  Or sinus complaints, better p tylenol and never in ams rec Please see patient coordinator before you leave today  to schedule venous dopplers> neg bilateral 08/27/11 Stop all caffeine - pay attention to the chronologic pattern of the headache and try alprazolam to see if it helps and call if worsens See [name] for arthritis > did not go     11/25/2011 f/u ov/Wert for multiple chronic medical problems cc worse arthritis esp R back and leg x one year, swelling is some better on present rx reviewed with her on med calendar. No sob, cough. Better control of leg swelling, no overt polydypsia, polyuria, viz changes rec Be sure to see Dr Gladstone Lighter about your back and leg pain Please remember to go to the lab  department downstairs for your tests - we will call you with the results when they are available.   03/02/2012 f/u ov/Wert not using med calendar cc  c/o having slight  HA x 3 months and states "feels bad and weak all of the time". Sugars fasting running as high has 300 fasting on glimepririede 2 mg one half each am, persistent leg swelling despite furosemide and lasix. Admits getting very forgetful re details of care and trying to use the alphabetized epic list for med rec purposes.  HA is generalized, in pms, better with tylenol with no assoc viz or neuro c/os or nausea, always absent in ams >>Change the furosemide to 2 in am and at lunch with additonal 2 at supper if needed  Increase Glimperide to 2 mg each am   03/18/2012 Follow up and MMSE /Med calendar  We reviewed all her meds and organized  them into a med calendar with pt education  She has a lot of old bottles that she says she keeps and put new meds into old bottles  Advised this is not a good idea.  Says her memory is slipping. Gets anxious when she can not remember things. Has gotten lost a couple of times. Son in [place]. She is considering going to live with him or him come here.  Does not want to go to NH.  MMSE scored a 29/30 but slow responses.  Clock draw was very abnormal. With hands of clock outside clock.  We dicussed referral to neuro due to concern for possible dementia  She refuses to go to neuro wants to be treated here.  Labs were unremarkable with neg RPR , nml tsh and nml b12 She denies headache, visual/speech changes.  Does have anxiety at times. No significant depression like symptoms.  Sugars has been running 200-250 . Amaryl was increased 2 weeks ago w/ A1C at 8.4  No low sugars.    04/15/2012 f/u ov/Wert confused with instructions, meds, "son took her list back to [place]" felt worse x 2 weeks with weak and dizziness but no specific pattern (not related to meals).  Fasting bs remain in low 200s  >med review   04/29/12 Follow up and med review     [name] Her last visit. Patient was having difficulty remembering her medications and had misplaced her medication calendar. She is brought all of her medications in today. We reviewed all her medications updated. Her medication calendar with patient education. It does appear that all her medications are up-to-date and have been refilled on time, according to the medication bottles She does easily get confused if you cannot slow down and go over the medications and with slowly We discussed the importance of keeping her list up to date and keep it with her at all times especially when she is seen at the doctor's office Blood sugars are slowly decreasing with her medication adjustments recently She has  had no low sugars She denies any chest pain, shortness of breath, abdominal pain, or increased edema We discussed once again about her memory and referral to neurology. However, she declined Referral  05/28/2012 Acute OV  Complains of right back and hip pain.  pt fell at home 05-25-12, right hip and groin area/right elbow are sore.   reports lost her balance and fell forward landing on right side.  She was able to get herself, and ambulates with minimal pain. Over the last 2 days, has been progressively worsening over and tender along the right side of her lower back into her right hip and right groin area, especially with walking. She denies any urinary symptoms, chest pain, loss of  consciousness, visual or speech changes, extremity weakness, radicular symptoms, or leg swelling >xray w/ no acute process   06/08/2012 Follow up  Returns for follow up for hip and back pain, seen 2 weeks ago after a fall.  Xrays showed no fracture.  Feeling better but still some residual soreness.  Does have some drainage and dry cough for 2 weeks.  rec Alternate ice and heat to back and hip  May use Tylenol arthritis for pain As needed   IF severe pain may use Roxicet As needed  -may cause you to be sleepy and off balance.   06/18/2012 f/u ov/Wert cc  Hips/ back better, no need for narcotic rx.  Memory no better on aricept and pt wants to stop.  Seems to using med calendar well. rec Stop fluticasone and the aricept Dymista one twice daily GERD .     09/16/2012 f/u ov/Wert cc pt states having increase sob, wheezing when moving and sitting still. along with heart palp. x 54month - seems worse around 4pm and better p supper >>RAST + /IGE ~319 , CT sinus neg   09/29/2012 Follow u p Pt returns for a 2 week follow up and med review  Her son from [place] is in town. He is trying to help her with her meds  We organized all her meds and updated her med calendar w/ pt education  She has a couple of old/expired  bottles -it is unclear if she is putting new meds into old bottles or they are expired.  She does admit she does not take her fluid pills as recommended.  I emphasized the importance of med compliance.  Son is concerned regarding memory . She does have mild forgetfullness but mostly seems to not process information well sometimes.  Repeated MMSE exam today. Tested well w/ good flow at 30/30 . Clock draw was slightly abn as she wrote the time instead of the hands of clock.   Previous MMSE 03/2012 29/30 . rpr /b12 nml 8/13 .  Labs last ov showed A1C tr up at 8.4.  No polyuria/polydipsia.  Previously started on Aricept 03/2012 . Stoppled 06/2012   12/22/2012 f/u ov/Wert re dm/hbp using calendar well  Chief Complaint  Patient presents with  . Follow-up    She c/o numbness and pain in her right arm since Sept 2013. She has been having a hard time picking things up. She also c/o continuing to have black stools and vomiting several times per month.  vomiting sev x per months, assoc with nausea,  black stools daily, no abd pain, maintaining on asa and ppi/hs h2. No wt loss.  Not clear whether has arm/ hand are really weak or numb or just weak due to pain when gripping, persistent daily symptoms snc Sept 2013.  >>referral to GI and Neuro   03/24/2013 Follow up  She returns for a follow up office visit.  Seen by Dr. Jarold Motto earlier this year in May  Underwent endoscopy 12/30/12 that was unremarkable except for H Pylori .  Tx w/ PYLERA therapy. She complains that stomach still upset on/off and dark stools on /off. Last colonscopy 2009 with severe diverticular dz. HBG in 12/2012 was Nml.  Was suppose to follow up with Neurology however appointment was changed .  Continues to have memory issues.  Son is with her today but is leaving to go back home.  She is leaving alone . We went over meds it is obvious that she is unclear of what she  is taking and gets confused easily.  BS at home 100-200 . Does have  polyuria.  No fever, chest pain, dyspnea, edema.  ? taking Mobic on/off.  >>refer to GI , d/c mobic   05/05/13 [facility] follow up  Admitted 8/19-25 for acute encephalopathy with DKA , possible Mennigitis Bacterial vs Viral . Tx with abx and antivirals. CT head with mod atrophy, no acute finding.  DM meds were adjusted.  Discharged to rehab center.  Returns today with son, feeling better. Has been undergoing rehab with PT /OT. Getting stronger but still weak.  Is going to transfer this week to Assisted Living.  Has been referred to Endocrinology with Dr Dwyane Dee for DM management.-Lantus was changed to 18 u in am and 14 u in pm. Amaryl/Januvia were stopped.   Was referred back to Neuro for dementia - felt aricept +/- namenda may be indicated but will hold off for now d/t recent acute illness. Leave to PCP for decision in future.  >>no changes   06/22/2013 [facility] follow up  Recently admitted for dehydration and FTT complicated by acute on chronic renal failure.  Improved with IVF hydration.  Wt down 18lbs since last ov.  Not eating. Feels like everything tastes bad. Has seen Dr. Sharlett Iles in GI for decreased appetite , wt loss, and diverticulitis . Tx w/ flagyl and cipro . No improvement.  Insulin stopped in hospital due to hypoglycemia and started on januvia.  Was seen by endrocrinology last week. Insulin restated with dose adjusted by Dr. Dwyane Dee.  Labs last ov with Dr. Dwyane Dee scr tr up 2.6. Denies vomiting or diarrhea, fever, leg swelling , bloody stools , urinary symptoms. No worsening of memory or behavior issues.  Does feel anxious a lot . Denies depression or cyring episodes.  Feels she is more active , working with PT/OT/speech.  At [facility] rec Discontinue Percocet , Calcium  And Chlortrimeton.  Decrease Spironolactone 25mg  Twice daily    Begin Glucerna drinks Three times a day  .  follow up Dr. Melvyn Novas  In 3 weeks and As needed    07/13/2013 f/u ov/Wert re: wt loss Chief  Complaint  Patient presents with  . Follow-up    Breathing doing well and cough.  Over all up and down, good and bad days  does not where she lives but knows date, year, Thxgiving last week.  Has chronic constipation and diffuse abd pain 24/7 not worse eating, not better p flatus or belching, present x years, worse x months.  occ nausea with vomiting. >no changes   08/23/2013 Follow up  6 week follow up - son feels there is vast improvement since last ov w/ GI issues.  mental status is unchanged; weight is up. Has noticed since several of her meds were stopped (chlortrimeton, percocet and calcium ) along w/ decreased dose of spironolactone she is feeling better. Appetite has returned. She was seen by GI and gastric emptying test showed gastroparesis . Not a candidate for prokinetics.  She says she is getting stronger, able to walk at times in her room without walker. Thinking clearer.   Continues follow up with Dr. Dwyane Dee for DM. Last A1C ~6.3.  Has follow up with neuro soon for memory issues.   She denies any hemoptysis, chest pain, orthopnea, PND, or leg swelling    Current Medications, Allergies, Complete Past Medical History, Past Surgical History, Family History, and Social History were reviewed in [employer] record.  ROS  Constitutional:   No  weight loss, night sweats,  Fevers, chills, + fatigue, or  lassitude.  HEENT:   No headaches,  Difficulty swallowing,  Tooth/dental problems, or  Sore throat,                No sneezing, itching, ear ache, nasal congestion, post nasal drip,   CV:  No chest pain,  Orthopnea, PND, swelling in lower extremities, anasarca, dizziness, palpitations, syncope.   GI  No heartburn, indigestion, abdominal pain, nausea, vomiting, diarrhea, change in bowel habits, loss of appetite, bloody stools.   Resp:   No chest wall deformity  Skin: no rash or lesions.  GU: no dysuria, change in color of urine, no urgency or  frequency.  No flank pain, no hematuria   MS:  No joint pain or swelling.  No decreased range of motion.  No back pain.  Psych:  No change in mood or affect. No depression or anxiety.  No memory loss.    Family History:   asthma in her brother who also has hypertension  mother died of stroke, also had diabetes and arthritis  father had multiple myeloma  4 siblings all in relatively good health  Neg for dementia     Social History:  Patient never smoked.  positive for second-hand smoke exposure  exercises 2-3 times a week  1 cup caffeine a day  married  1 child       Past Medical History:  GERD (ICD-530.81)  ABDOMINAL PAIN, CHRONIC (ICD-789.00)  FATIGUE, CHRONIC (ICD-780.79)  DEGENERATIVE JOINT DISEASE (ICD-715.90)  - L5 radiculopathy confirmed by mri 08/2005  --refer to ortho August 24, 2008 >>> Dr Gladstone Lighter  HYPERTENSIVE CARDIOVASCULAR DISEASE (ICD-402.90)  OBESITY (ICD-278.00)  - Target wt = 158 for BMI < 30  - Referred [facility] 04/2005  GASTRITIS, CHRONIC (ICD-535.10)  BENIGN NEOPLASM OTH&UNSPEC SITE DIGESTIVE SYSTEM (ICD-211.9)  ESOPHAGEAL STRICTURE (ICD-530.3)..............................................Marland KitchenPatterson  - EGD 12/14/07  HIATAL HERNIA (ICD-553.3)  COLONIC POLYPS, ADENOMATOUS (ICD-211.3)  - Colonoscopy 12/14/07  DIVERTICULOSIS, COLON (ICD-562.10)  DYSLIPIDEMIA (ICD-272.4)  ARTHRITIS (ICD-716.90)  DM (ICD-250.00)  HYPERTENSION (ICD-401.9)  [place] 03/2003  - Pneumovax 03/2003  - CPX August 28, 2010  -Mammogram 10/2009, ? abn asymmetry >>repeat ok, last mammo 4/13 >neg  MICROCYTIC ANEMA  - DX 02/03/09, Stool g neg x 6, rx Fe 65 mg /day > improving March 17, 2009  Edema--08/10/08--venous doppler neg for dvt, ?ruptured bakers cyst-right  March 20, 2009 repeat venous doppler > neg bilaterally  May 21, 2010 repeat venous dopplers >> neg bilat  Complex med regimen>>Meds reviewed with pt education and computerized med calendar  September 11, 2010 , 05/10/2011 , 03/18/2012 . 04/29/12 Memory loss  MMSE 03/18/2012 > 29/30 , Clock Draw abn , neg RPR , nml b12 , 09/29/2012 >30/30 , clock abn >refer to neuro  Probable dementia, rx held due to multiple co-morbidities  09/29/2012 RAST + w/ IGE at 319        Objective:   Physical Exam obese ambulatory bf nad Partial rolling walker  wt 213 10/21/08 > 204 April 26, 2009 > 08/23/2011  191> 11/25/2011  201 > 03/02/2012  193 > 198 03/18/2012 > 186 04/15/2012 >191 05/28/2012 >184 06/08/2012 > 06/18/2012 186 > 189 09/16/2012 > 12/22/2012  198 > 183 03/24/2013  >168 05/05/13 >150 06/22/2013  > 07/13/2013   149 >162 08/23/2013  HEENT: nl dentition, turbinates, and orophanx. Nl external ear canals without cough reflex  Neck without JVD/Nodes/TM  Lungs  clear to A and P bilaterally without cough on insp or exp maneuvers  RRR no s3 or murmur or increase in P2,tr edema   Abd soft min distended  and benign with nl excursion in the supine position. No bruits or organomegaly   EXT/Skin  :  venous insufficiency changes , no rash  Neuro:  No focal motor deficits , alert to person and place.         Assessment:

## 2013-08-23 NOTE — Assessment & Plan Note (Signed)
Continue follow up with neuro

## 2013-09-13 ENCOUNTER — Telehealth: Payer: Self-pay | Admitting: Internal Medicine

## 2013-09-13 NOTE — Telephone Encounter (Signed)
No fax yet in triage. Will forward to leslie to look out for fax.

## 2013-09-13 NOTE — Telephone Encounter (Signed)
Fax never received  [facility] for [facility] to fax this to the fax machine up front

## 2013-09-13 NOTE — Telephone Encounter (Signed)
Called and spoke with Allison Rodgers. She willr efax the order form over to triage. Will await fax

## 2013-09-20 ENCOUNTER — Telehealth: Payer: Self-pay | Admitting: Internal Medicine

## 2013-09-20 NOTE — Telephone Encounter (Signed)
On 09-13-13 Magda Paganini called and asked that order be faxed to up front fax. Magda Paganini did you ever receive this fax? Hartly Bing, CMA

## 2013-09-22 NOTE — Telephone Encounter (Signed)
Yes- and it was already signed and faxed back  Nothing further needed

## 2013-09-27 ENCOUNTER — Ambulatory Visit: Payer: Medicare HMO | Admitting: Diagnostic Neuroimaging

## 2013-10-19 NOTE — Telephone Encounter (Signed)
done

## 2013-10-28 ENCOUNTER — Ambulatory Visit (INDEPENDENT_AMBULATORY_CARE_PROVIDER_SITE_OTHER): Payer: Medicare HMO | Admitting: Diagnostic Neuroimaging

## 2013-10-28 ENCOUNTER — Encounter: Payer: Self-pay | Admitting: Diagnostic Neuroimaging

## 2013-10-28 ENCOUNTER — Encounter (INDEPENDENT_AMBULATORY_CARE_PROVIDER_SITE_OTHER): Payer: Self-pay

## 2013-10-28 VITALS — BP 126/54 | HR 52 | Temp 98.1°F | Ht 60.0 in | Wt 160.0 lb

## 2013-10-28 DIAGNOSIS — R413 Other amnesia: Secondary | ICD-10-CM

## 2013-10-28 DIAGNOSIS — G3184 Mild cognitive impairment, so stated: Secondary | ICD-10-CM

## 2013-10-28 NOTE — Patient Instructions (Signed)
Continue current medications. 

## 2013-10-28 NOTE — Progress Notes (Signed)
[facility]  PATIENT: Letonia Nicodemus Turbyfill DOB: 04-Mar-1924  REFERRING CLINICIAN: Wert HISTORY FROM: patient and family friend (son via phone from [place]) REASON FOR VISIT: follow up   [place]:  No chief complaint on file.   HISTORY OF PRESENT ILLNESS:   UPDATE 10/28/13: Since last visit patient is doing better. She is in better spirits and mood. She feels like her memory symptoms are better. Apparently her PCP made some medication adjustments which have contributed to this stabilization/improvement. Doing well at [facility] assisted [facility].  UPDATE 04/15/13: Since last visit, patient had MRI of the brain in April which showed some atrophy but no acute findings. Patient continued to have problems with stomach pain, constipation, diabetes control. In August 2014, patient was found confused at home, and admitted to the hospital with fever, encephalopathy, elevated blood sugar/dka. Patient was stabilized medically and treated empirically for meningitis (although LP was not able to successfully be performed due to scar tissue). Now patient has transition to skilled nursing facility.  Patient continues to have problems with poor appetite, hoarse voice, trouble swallowing, nausea, constipation, bilateral shoulder pain, poor energy, decreased motivation, anxiety and depression.  PRIOR HPI (11/12/12): 80 year old right-handed female with hypertension, diabetes, arthritis, anxiety, here for evaluation of memory problems. Patient accompanied by son today.  Patient reports 2-3 month history of short-term memory loss, forgetting recent conversations, tasks, events. Sometimes she forgets to pay her bills. Sometimes she forgets to lock doors. Patient continues to live alone and takes care of her activities of daily living. She has been more withdrawn and less active socially. Patient having more difficulty with processing written language and documents.  According to  the son her memory problems have actually been going on for at least one year. He was out of town but checks in on patient fairly frequently.  Patient also having intermittent episodes of auditory and visual hallucinations. Sometimes she sees someone in the driveway, then looked back again and does not see anyone there. Sometimes she hears someone knocking on her door but when she checks no one is there. This been going on for the past few months.  Patient was treated with Aricept for a few months, and then discontinued. Patient was restarted on this again a second time but discontinued. Patient did not notice any benefit or side effects of the medication.  REVIEW OF SYSTEMS: Full 14 system review of systems performed and notable only weakness back pain walking difficulty coordination problem constipation cold intolerance cough leg swelling.   ALLERGIES: Allergies  Allergen Reactions  . Penicillins Other (See Comments)    REACTION: causes hallucinations  . Sulfamethoxazole-Trimethoprim Swelling    REACTION: rash  . Bactrim     Unknown per mar  . Trimethoprim     Unknown per mar     HOME MEDICATIONS: Outpatient Prescriptions Prior to Visit  Medication Sig Dispense Refill  . acetaminophen (TYLENOL) 500 MG tablet 500-1,000 mg every 6 (six) hours as needed for pain. Per bottle as needed for pain      . ALPRAZolam (XANAX) 0.25 MG tablet Take 1 tablet (0.25 mg total) by mouth every 6 (six) hours as needed for anxiety.  30 tablet  0  . aspirin 81 MG chewable tablet Chew 81 mg by mouth daily.        . dorzolamide (TRUSOPT) 2 % ophthalmic solution Place 1 drop into both eyes at bedtime.      . fluocinonide cream (LIDEX) 0.05 %  Apply 1 application topically 2 (two) times daily as needed.       . furosemide (LASIX) 20 MG tablet Take 1 tablet (20 mg total) by mouth daily.  30 tablet  1  . guaiFENesin-dextromethorphan (ROBITUSSIN DM) 100-10 MG/5ML syrup Take 5 mLs by mouth 3 (three) times daily as  needed for cough.      . insulin glargine (LANTUS) 100 UNIT/ML injection Inject 10 Units into the skin 2 (two) times daily.      Marland Kitchen latanoprost (XALATAN) 0.005 % ophthalmic solution 1 drop at bedtime.      . pantoprazole (PROTONIX) 40 MG tablet Take 1 tablet (40 mg total) by mouth 2 (two) times daily.  60 tablet  0  . Probiotic Product (ALIGN) 4 MG CAPS Take 1 tablet by mouth daily.      . sitaGLIPtin (JANUVIA) 25 MG tablet Take 1 tablet (25 mg total) by mouth daily.  30 tablet  1  . sorbitol 70 % solution Take 15 mLs by mouth daily as needed.      Marland Kitchen spironolactone (ALDACTONE) 25 MG tablet Take 25 mg by mouth 2 (two) times daily.      Marland Kitchen GLUCERNA (GLUCERNA) LIQD Take 237 mLs by mouth 3 (three) times daily between meals.      Marland Kitchen loperamide (IMODIUM A-D) 2 MG tablet Take 2 mg by mouth daily as needed for diarrhea or loose stools.      . sodium chloride (OCEAN) 0.65 % nasal spray Place 2 sprays into the nose as needed for congestion.       No facility-administered medications prior to visit.    PAST MEDICAL HISTORY: Past Medical History  Diagnosis Date  . GERD (gastroesophageal reflux disease)   . Chronic abdominal pain   . DJD (degenerative joint disease)   . Hypertensive cardiovascular disease   . Obesity   . Gastritis, chronic   . 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 Esophageal stricture   . Hiatal hernia   . Hx of adenomatous colonic polyps   . Diverticulosis   . Dyslipidemia   . Arthritis   . DM (diabetes mellitus)   . Hypertension   . Microcytic anemia   . Edema   . Renal disorder   . Bacterial meningitis     PAST SURGICAL HISTORY: Past Surgical History  Procedure Laterality Date  . Appendectomy    . Cholecystectomy  1999  . Vesicovaginal fistula closure w/ tah  1957  . Bilateral oophorectomy  1957  . Hernia repair  [id number]    umbilical and ventral hernia repair by Dr. Margot Chimes  . Rotator cuff repair  7/05    right  . Back surgery        FAMILY HISTORY: Family History  Problem Relation Age of Onset  . Stroke Mother   . Cancer Father   . Hypertension Other     SOCIAL HISTORY:  History   Social History  . Marital Status: Widowed    Spouse Name: N/A    Number of Children: 1  . Years of Education: College   Occupational History  . Retired    Social History Main Topics  . Smoking status: Never Smoker   . Smokeless tobacco: Never Used  . Alcohol Use: No  . Drug Use: No  . Sexual Activity: Not on file   Other Topics Concern  . Not on file   Social History Narrative   Pt lives at home alone.  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 She drinks 1 cup of caffeine per month and rarely drinks sodas.     PHYSICAL EXAM  Filed Vitals:   10/28/13 1338  BP: 126/54  Pulse: 52  Temp: 98.1 F (36.7 C)  TempSrc: Oral  Height: 5' (1.524 m)  Weight: 160 lb (72.576 kg)   Body mass index is 31.25 kg/(m^2).  GENERAL EXAM: Patient is in no distress  CARDIOVASCULAR: Regular rate and rhythm, no murmurs, no carotid bruits  NEUROLOGIC: MENTAL STATUS: awake, alert, language fluent, comprehension intact, naming intact;  POSITIVE SNOUT, MYERSONS. MMSE 25/30 (misses 1 for city, 2 for world backwards, 1 for recall and 1 for pentagons). AFT 6. CANNOT DRAW CLOCK (numbers only, no circle or hands). GDS 4.  CRANIAL NERVE: pupils equal and reactive to light, visual fields full to confrontation, extraocular muscles intact, no nystagmus, facial sensation and strength symmetric, uvula midline, shoulder shrug symmetric, tongue midline.  MOTOR: normal bulk and tone, DIFFUSE 4/5. LIMITED IN BUE DUE TO SHOULDER PAIN. SENSORY: DECR VIB AT TOES (< 5 SECS) COORDINATION: finger-nose-finger, fine finger movements SLOW THROUGHOUT. REFLEXES: BUE 1, BLE TRACE GAIT/STATION: IN WHEEL CHAIR.   DIAGNOSTIC DATA (LABS, IMAGING, TESTING) - I reviewed patient records, labs, notes, testing and imaging myself  where available.  Lab Results  Component Value Date   WBC 8.9 07/13/2013   HGB 14.2 07/13/2013   HCT 44.4 07/13/2013   MCV 74.6* 07/13/2013   PLT 212.0 07/13/2013      Component Value Date/Time   NA 138 08/16/2013 1049   K 4.0 08/16/2013 1049   CL 104 08/16/2013 1049   CO2 28 08/16/2013 1049   GLUCOSE 175* 08/16/2013 1049   GLUCOSE 97 07/16/2006 1041   BUN 24* 08/16/2013 1049   CREATININE 2.0* 08/16/2013 1049   CALCIUM 9.1 08/16/2013 1049   PROT 7.4 06/07/2013 1205   ALBUMIN 3.7 06/07/2013 1205   AST 26 06/07/2013 1205   ALT 18 06/07/2013 1205   ALKPHOS 62 06/07/2013 1205   BILITOT 0.7 06/07/2013 1205   GFRNONAA 25* 06/01/2013 0612   GFRAA 29* 06/01/2013 0612   Lab Results  Component Value Date   CHOL 170 06/07/2013   HDL 49.80 06/07/2013   LDLCALC 86 06/07/2013   TRIG 172.0* 06/07/2013   CHOLHDL 3 06/07/2013   Lab Results  Component Value Date   HGBA1C 6.3 06/07/2013   Lab Results  Component Value Date   [id number] 026 03/18/2012   Lab Results  Component Value Date   TSH 0.930 05/29/2013    04/01/13 MRI brain - Motion degraded exam demonstrating no acute stroke or hemorrhage.  Mild dural thickening around the foramen magnum of uncertain  significance. In the setting of fever and encephalopathy,  meningitis cannot completely be excluded. If there are no  contraindications, lumbar puncture may be helpful in further  Evaluation.   04/01/13 MRA head - Suspected mid to distal basilar stenosis on this motion degraded  exam. Gross patency of internal carotid arteries is established.  I reviewed images myself and agree with interpretation. Also there is mild-moderate atrophy and ventriculomegaly on ex vacuo basis. -VRP   ASSESSMENT AND PLAN  80 y.o. year old female  has a past medical history of GERD (gastroesophageal reflux disease); Chronic abdominal pain; DJD (degenerative joint disease); Hypertensive cardiovascular disease; Obesity; Gastritis, chronic; Benign neoplasm of other  and unspecified site of the digestive system; Esophageal stricture; Hiatal hernia; adenomatous colonic polyps;  Diverticulosis; Dyslipidemia; Arthritis; DM (diabetes mellitus); Hypertension; Microcytic anemia; Edema; Renal disorder; and Bacterial meningitis. here with progressive short term memory loss (2013-present).  Has intermittent anxiety, auditory and visual hallucinations. MMSE was 26/30 in March 2014, now 25/30 in March 2015. Has frontal release signs (snout, myersons) which raises possibility for an underlying neurodegenerative process. Had some anxiety and depression symptoms which have improved.  Ddx: neurodegenerative (MCI vs alzheimers) vs pseudo-dementia of depression/anxiety vs medication side effect  PLAN: 1. Overall doing better; will hold off on aricept versus namenda (given patient's age and stable course)  Return in about 1 year (around 10/29/2014), or if symptoms worsen or fail to improve.    Penni Bombard, MD 1/51/7616, 07:37TG Certified in Neurology, Neurophysiology and Neuroimaging  [facility] [address] [place],  [address] [phone]

## 2013-11-15 ENCOUNTER — Encounter: Payer: Self-pay | Admitting: Endocrinology

## 2013-11-15 ENCOUNTER — Ambulatory Visit (INDEPENDENT_AMBULATORY_CARE_PROVIDER_SITE_OTHER): Payer: Medicare PPO | Admitting: Endocrinology

## 2013-11-15 VITALS — BP 122/56 | HR 69 | Temp 98.1°F | Resp 16 | Ht 60.0 in | Wt 161.0 lb

## 2013-11-15 DIAGNOSIS — N184 Chronic kidney disease, stage 4 (severe): Secondary | ICD-10-CM

## 2013-11-15 DIAGNOSIS — E1165 Type 2 diabetes mellitus with hyperglycemia: Principal | ICD-10-CM

## 2013-11-15 DIAGNOSIS — E785 Hyperlipidemia, unspecified: Secondary | ICD-10-CM

## 2013-11-15 DIAGNOSIS — IMO0001 Reserved for inherently not codable concepts without codable children: Secondary | ICD-10-CM

## 2013-11-15 LAB — COMPREHENSIVE METABOLIC PANEL
ALBUMIN: 3.4 g/dL — AB (ref 3.5–5.2)
ALK PHOS: 65 U/L (ref 39–117)
ALT: 12 U/L (ref 0–35)
AST: 15 U/L (ref 0–37)
BUN: 42 mg/dL — AB (ref 6–23)
CO2: 27 mEq/L (ref 19–32)
CREATININE: 2.1 mg/dL — AB (ref 0.4–1.2)
Calcium: 9.4 mg/dL (ref 8.4–10.5)
Chloride: 104 mEq/L (ref 96–112)
GFR: 29.05 mL/min — ABNORMAL LOW (ref >60.00)
GLUCOSE: 161 mg/dL — AB (ref 70–99)
POTASSIUM: 4.2 meq/L (ref 3.5–5.1)
Sodium: 140 mEq/L (ref 135–145)
Total Bilirubin: 0.4 mg/dL (ref 0.3–1.2)
Total Protein: 7.5 g/dL (ref 6.0–8.3)

## 2013-11-15 LAB — URINALYSIS, ROUTINE W REFLEX MICROSCOPIC
Bilirubin Urine: NEGATIVE
Ketones, ur: NEGATIVE
Nitrite: NEGATIVE
Specific Gravity, Urine: 1.01 (ref 1.000–1.030)
Total Protein, Urine: NEGATIVE
URINE GLUCOSE: NEGATIVE
Urobilinogen, UA: 0.2 (ref 0.0–1.0)
pH: 5.5 (ref 5.0–8.0)

## 2013-11-15 LAB — MICROALBUMIN / CREATININE URINE RATIO
Creatinine,U: 53.5 mg/dL
MICROALB/CREAT RATIO: 0.4 mg/g (ref 0.0–30.0)
Microalb, Ur: 0.2 mg/dL (ref 0.0–1.9)

## 2013-11-15 LAB — HEMOGLOBIN A1C: Hgb A1c MFr Bld: 6.9 % — ABNORMAL HIGH (ref 4.6–6.5)

## 2013-11-15 LAB — LIPID PANEL
CHOLESTEROL: 146 mg/dL (ref 0–200)
HDL: 55.6 mg/dL (ref >39.00)
LDL Cholesterol: 72 mg/dL (ref 0–99)
Total CHOL/HDL Ratio: 3
Triglycerides: 90 mg/dL (ref 0.0–149.0)
VLDL: 18 mg/dL (ref 0.0–40.0)

## 2013-11-15 MED ORDER — REPAGLINIDE 1 MG PO TABS: 1.0000 mg | ORAL_TABLET | Freq: Every day | ORAL | Status: DC

## 2013-11-15 NOTE — Patient Instructions (Signed)
Stop Januvia and start Prandin 1mg  before supper

## 2013-11-15 NOTE — Progress Notes (Signed)
Patient ID: Allison Rodgers, female   DOB: Oct 23, 1923, 80 y.o.   MRN: [id number]   Reason for Appointment : Followup for Type 2 Diabetes  History of Present llness          Diagnosis: Type 2 diabetes mellitus, date of diagnosis: ? 2007       Past history:  Apparently she had blood sugars over 500 at the time of diagnosis She was started on insulin at the time of diagnosis and has been continuing on this with various regimens Also her weight has been previously significantly more, about 200 pounds  Recent history: The patient has been at the nursing home and occasionally visiting her home.  She continues to be on low-dose insulin and no changes were made on the last visit but has not followed up since 1/14 Not clear if she is benefiting reduced dose Januvia as renal dysfunction is inadequate She is still on Glucerna 3 times a day although she is doing 1/2 can tid INSULIN regimen: Lantus 10 units twice daily  Glucose monitoring:  done 2 times  a day.   Glucometer: ?     Blood Glucose readings in the morning: 118, 112 Lunchtime 132, 171 Suppertime 140 and bedtime 173-215 Hypoglycemia frequency: None recently             Meals: 3 meals per day.   Physical activity: exercise: not able to walk much Dietician consultations: Several years ago             Retinal exam: Most recent:.2014    Wt Readings from Last 3 Encounters:  11/15/13 161 lb (73.029 kg)  10/28/13 160 lb (72.576 kg)  08/23/13 162 lb (73.483 kg)   LABS:  Lab Results  Component Value Date   HGBA1C 6.3 06/07/2013   HGBA1C 6.2* 05/30/2013   HGBA1C 9.9* 03/24/2013   Lab Results  Component Value Date   MICROALBUR 1.5 08/28/2010   LDLCALC 86 06/07/2013   CREATININE 2.0* 08/16/2013       Medication List       This list is accurate as of: 11/15/13 11:23 AM.  Always use your most recent med list.               acetaminophen 500 MG tablet  Commonly known as:  TYLENOL  500-1,000 mg every 6 (six) hours as needed for  pain. Per bottle as needed for pain     ALIGN 4 MG Caps  Take 1 tablet by mouth daily.     ALPRAZolam 0.25 MG tablet  Commonly known as:  XANAX  Take 1 tablet (0.25 mg total) by mouth every 6 (six) hours as needed for anxiety.     aspirin 81 MG chewable tablet  Chew 81 mg by mouth daily.     dorzolamide 2 % ophthalmic solution  Commonly known as:  TRUSOPT  Place 1 drop into both eyes at bedtime.     fluocinonide cream 0.05 %  Commonly known as:  LIDEX  Apply 1 application topically 2 (two) times daily as needed.     furosemide 20 MG tablet  Commonly known as:  LASIX  Take 1 tablet (20 mg total) by mouth daily.     guaiFENesin-dextromethorphan 100-10 MG/5ML syrup  Commonly known as:  ROBITUSSIN DM  Take 5 mLs by mouth 3 (three) times daily as needed for cough.     insulin glargine 100 UNIT/ML injection  Commonly known as:  LANTUS  Inject 10 Units into the skin 2 (  two) times daily.     loperamide 2 MG capsule  Commonly known as:  IMODIUM  Take by mouth as needed for diarrhea or loose stools.     pantoprazole 40 MG tablet  Commonly known as:  PROTONIX  Take 1 tablet (40 mg total) by mouth 2 (two) times daily.     sitaGLIPtin 25 MG tablet  Commonly known as:  JANUVIA  Take 1 tablet (25 mg total) by mouth daily.     sorbitol 70 % solution  Take 15 mLs by mouth daily as needed.     spironolactone 25 MG tablet  Commonly known as:  ALDACTONE  Take 25 mg by mouth 2 (two) times daily.     XALATAN 0.005 % ophthalmic solution  Generic drug:  latanoprost  1 drop at bedtime.        Allergies:  Allergies  Allergen Reactions  . Penicillins Other (See Comments)    REACTION: causes hallucinations  . Sulfamethoxazole-Trimethoprim Swelling    REACTION: rash  . Bactrim     Unknown per mar  . Trimethoprim     Unknown per mar     Past Medical History  Diagnosis Date  . GERD (gastroesophageal reflux disease)   . Chronic abdominal pain   . DJD (degenerative joint  disease)   . Hypertensive cardiovascular disease   . Obesity   . Gastritis, chronic   . 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 Esophageal stricture   . Hiatal hernia   . Hx of adenomatous colonic polyps   . Diverticulosis   . Dyslipidemia   . Arthritis   . DM (diabetes mellitus)   . Hypertension   . Microcytic anemia   . Edema   . Renal disorder   . Bacterial meningitis     Past Surgical History  Procedure Laterality Date  . Appendectomy    . Cholecystectomy  1999  . Vesicovaginal fistula closure w/ tah  1957  . Bilateral oophorectomy  1957  . Hernia repair  [id number]    umbilical and ventral hernia repair by Dr. Margot Chimes  . Rotator cuff repair  7/05    right  . Back surgery      Family History  Problem Relation Age of Onset  . Stroke Mother   . Cancer Father   . Hypertension Other     Social History:  reports that she has never smoked. She has never used smokeless tobacco. She reports that she does not drink alcohol or use illicit drugs.    Review of Systems    Lipids: She is currently not on any medications for this  Lab Results  Component Value Date   CHOL 170 06/07/2013   HDL 49.80 06/07/2013   LDLCALC 86 06/07/2013   TRIG 172.0* 06/07/2013   CHOLHDL 3 06/07/2013  \   She is still taking diuretics including spironolactone but has edema. Also has mild hyponatremia   Physical Examination:  BP 122/56  Pulse 69  Temp(Src) 98.1 F (36.7 C)  Resp 16  Ht 5' (1.524 m)  Wt 161 lb (73.029 kg)  BMI 31.44 kg/m2  SpO2 97%     ASSESSMENT:  Diabetes type 2   Her glucose readings seem to be fairly stable with using 10 units  Lantustwice a day. She  does have relatively  high readings  after dinner but only 3 readings available for review from nursing home. Most of her blood sugars  otherwise are below  200 Also taking Januvia with questionable benefit With her age her blood sugar targets  did not have to be tight   Her  weight is stable  PLAN:   Continue Lantus 10 units twice a day  She can  Discontinue Januvia 25 mg and instead try Prandin 1 mg before supper to help with postprandial readings, consider taking this at breakfast also  Have asked the nursing home to send readings from last month also for review   Jeannemarie Sawaya 11/15/2013, 11:23 AM   Addendum: Labs as follows, A1c reasonably good  Office Visit on 11/15/2013  Component Date Value Ref Range Status  . Hemoglobin A1C 11/15/2013 6.9* 4.6 - 6.5 % Final   Glycemic Control Guidelines for People with Diabetes:Non Diabetic:  <6%Goal of Therapy: <7%Additional Action Suggested:  >8%   . Sodium 11/15/2013 140  135 - 145 mEq/L Final  . Potassium 11/15/2013 4.2  3.5 - 5.1 mEq/L Final  . Chloride 11/15/2013 104  96 - 112 mEq/L Final  . CO2 11/15/2013 27  19 - 32 mEq/L Final  . Glucose, Bld 11/15/2013 161* 70 - 99 mg/dL Final  . BUN 11/15/2013 42* 6 - 23 mg/dL Final  . Creatinine, Ser 11/15/2013 2.1* 0.4 - 1.2 mg/dL Final  . Total Bilirubin 11/15/2013 0.4  0.3 - 1.2 mg/dL Final  . Alkaline Phosphatase 11/15/2013 65  39 - 117 U/L Final  . AST 11/15/2013 15  0 - 37 U/L Final  . ALT 11/15/2013 12  0 - 35 U/L Final  . Total Protein 11/15/2013 7.5  6.0 - 8.3 g/dL Final  . Albumin 11/15/2013 3.4* 3.5 - 5.2 g/dL Final  . Calcium 11/15/2013 9.4  8.4 - 10.5 mg/dL Final  . GFR 11/15/2013 29.05* >60.00 mL/min Final  . Cholesterol 11/15/2013 146  0 - 200 mg/dL Final   ATP III Classification       Desirable:  < 200 mg/dL               Borderline High:  200 - 239 mg/dL          High:  > = 240 mg/dL  . Triglycerides 11/15/2013 90.0  0.0 - 149.0 mg/dL Final   Normal:  <150 mg/dLBorderline High:  150 - 199 mg/dL  . HDL 11/15/2013 55.60  >39.00 mg/dL Final  . VLDL 11/15/2013 18.0  0.0 - 40.0 mg/dL Final  . LDL Cholesterol 11/15/2013 72  0 - 99 mg/dL Final  . Total CHOL/HDL Ratio 11/15/2013 3   Final                  Men          Women1/2 Average Risk     3.4           3.3Average Risk          5.0          4.42X Average Risk          9.6          7.13X Average Risk          15.0          11.0                      . Microalb, Ur 11/15/2013 0.2  0.0 - 1.9 mg/dL Final  . Creatinine,U 11/15/2013 53.5   Final  . Microalb Creat Ratio 11/15/2013 0.4  0.0 - 30.0 mg/g Final  . Color, Urine 11/15/2013 YELLOW  Yellow;Lt. Yellow Final  . APPearance 11/15/2013 CLEAR  Clear Final  . Specific Gravity, Urine 11/15/2013 1.010  1.000-1.030 Final  . pH 11/15/2013 5.5  5.0 - 8.0 Final  . Total Protein, Urine 11/15/2013 NEGATIVE  Negative Final  . Urine Glucose 11/15/2013 NEGATIVE  Negative Final  . Ketones, ur 11/15/2013 NEGATIVE  Negative Final  . Bilirubin Urine 11/15/2013 NEGATIVE  Negative Final  . Hgb urine dipstick 11/15/2013 TRACE-LYSED* Negative Final  . Urobilinogen, UA 11/15/2013 0.2  0.0 - 1.0 Final  . Leukocytes, UA 11/15/2013 SMALL* Negative Final  . Nitrite 11/15/2013 NEGATIVE  Negative Final  . WBC, UA 11/15/2013 7-10/hpf* 0-2/hpf Final  . RBC / HPF 11/15/2013 0-2/hpf  0-2/hpf Final  . Squamous Epithelial / LPF 11/15/2013 Rare(0-4/hpf)  Rare(0-4/hpf) Final  . Hyaline Casts, UA 11/15/2013 Presence of* None Final

## 2013-11-17 ENCOUNTER — Ambulatory Visit (INDEPENDENT_AMBULATORY_CARE_PROVIDER_SITE_OTHER): Payer: Medicare HMO | Admitting: Podiatry

## 2013-11-17 ENCOUNTER — Encounter: Payer: Self-pay | Admitting: Podiatry

## 2013-11-17 VITALS — BP 140/62 | HR 63 | Resp 17 | Ht 60.0 in | Wt 160.0 lb

## 2013-11-17 DIAGNOSIS — B351 Tinea unguium: Secondary | ICD-10-CM

## 2013-11-17 DIAGNOSIS — M79609 Pain in unspecified limb: Secondary | ICD-10-CM

## 2013-11-17 NOTE — Patient Instructions (Signed)
Return at three-month intervals for debridement of mycotic toenails.

## 2013-11-17 NOTE — Progress Notes (Signed)
Subjective: Orientated x70 80 year old black female presents for ongoing debridement of painful mycotic toenails. She was last seen for this service on 08/18/2013  Dermatological: Elongated, discolored, hypertrophic toenails x10.   Assessment: Symptomatic onychomycoses x10   Plan: Debridement of toenails x10 without a bleeding. Reappoint at three-month intervals.

## 2013-11-22 ENCOUNTER — Encounter: Payer: Self-pay | Admitting: Internal Medicine

## 2013-11-22 ENCOUNTER — Ambulatory Visit (INDEPENDENT_AMBULATORY_CARE_PROVIDER_SITE_OTHER): Payer: Medicare PPO | Admitting: Internal Medicine

## 2013-11-22 ENCOUNTER — Other Ambulatory Visit (INDEPENDENT_AMBULATORY_CARE_PROVIDER_SITE_OTHER): Payer: Medicare PPO

## 2013-11-22 VITALS — BP 112/58 | HR 54 | Temp 98.6°F | Ht 64.0 in | Wt 161.0 lb

## 2013-11-22 DIAGNOSIS — N898 Other specified noninflammatory disorders of vagina: Secondary | ICD-10-CM

## 2013-11-22 DIAGNOSIS — R109 Unspecified abdominal pain: Secondary | ICD-10-CM

## 2013-11-22 DIAGNOSIS — R5381 Other malaise: Secondary | ICD-10-CM

## 2013-11-22 DIAGNOSIS — R5383 Other fatigue: Principal | ICD-10-CM

## 2013-11-22 DIAGNOSIS — I10 Essential (primary) hypertension: Secondary | ICD-10-CM

## 2013-11-22 DIAGNOSIS — N189 Chronic kidney disease, unspecified: Secondary | ICD-10-CM

## 2013-11-22 DIAGNOSIS — Z23 Encounter for immunization: Secondary | ICD-10-CM

## 2013-11-22 LAB — CBC WITH DIFFERENTIAL/PLATELET
BASOS ABS: 0 10*3/uL (ref 0.0–0.1)
Basophils Relative: 0.6 % (ref 0.0–3.0)
Eosinophils Absolute: 0.2 10*3/uL (ref 0.0–0.7)
Eosinophils Relative: 2.7 % (ref 0.0–5.0)
HCT: 37.2 % (ref 36.0–46.0)
Hemoglobin: 11.7 g/dL — ABNORMAL LOW (ref 12.0–15.0)
LYMPHS ABS: 2.4 10*3/uL (ref 0.7–4.0)
Lymphocytes Relative: 30.4 % (ref 12.0–46.0)
MCHC: 31.5 g/dL (ref 30.0–36.0)
MCV: 77 fl — AB (ref 78.0–100.0)
MONO ABS: 0.6 10*3/uL (ref 0.1–1.0)
Monocytes Relative: 7.2 % (ref 3.0–12.0)
NEUTROS PCT: 59.1 % (ref 43.0–77.0)
Neutro Abs: 4.6 10*3/uL (ref 1.4–7.7)
PLATELETS: 235 10*3/uL (ref 150.0–400.0)
RBC: 4.84 Mil/uL (ref 3.87–5.11)
RDW: 13.3 % (ref 11.5–14.6)
WBC: 7.8 10*3/uL (ref 4.5–10.5)

## 2013-11-22 LAB — TSH: TSH: 1.11 u[IU]/mL (ref 0.35–5.50)

## 2013-11-22 NOTE — Patient Instructions (Addendum)
Your back and finger problems should be directed Dr Gladstone Lighter  Your stomach problems should be brought to Dr Buel Ream service if then get worse  Please see patient coordinator before you leave today  to schedule gyn eval for your discharge  I will Dr Dwyane Dee to address your primary care issues going forward and in meaintime would like to know if one of doctors rounding there can start seeing you for that purpose.  Tetanus shot today.   Follow up here will be as needed for specified.

## 2013-11-22 NOTE — Progress Notes (Signed)
Quick Note:  LMTCB ______ 

## 2013-11-22 NOTE — Progress Notes (Signed)
Subjective:     Patient ID: Allison Rodgers, female   DOB: 1923/11/10   MRN: [id number]  Brief patient profile:  46  yobf with morbid obesity complicated by DM, HTN and Dyslipidemia and chronic venous insufficiency with dependent edema.        12/22/2012 f/u ov/Wert re dm/hbp using calendar well  Chief Complaint  Patient presents with  . Follow-up    She c/o numbness and pain in her right arm since Sept 2013. She has been having a hard time picking things up. She also c/o continuing to have black stools and vomiting several times per month.  vomiting sev x per months, assoc with nausea,  black stools daily, no abd pain, maintaining on asa and ppi/hs h2. No wt loss. Not clear whether has arm/ hand are really weak or numb or just weak due to pain when gripping, persistent daily symptoms snc Sept 2013.  >>referral to GI and Neuro   03/24/2013 Follow up  She returns for a follow up office visit.  Seen by Dr. Sharlett Iles earlier this year in May  Underwent endoscopy 12/30/12 that was unremarkable except for H Pylori .  Tx w/ PYLERA therapy. She complains that stomach still upset on/off and dark stools on /off. Last colonscopy 2009 with severe diverticular dz. HBG in 12/2012 was Nml.  Was supposed to follow up with Neurology however appointment was changed .  Continues to have memory issues.  Son is with her today but is leaving to go back home.  She is leaving alone . We went over meds it is obvious that she is unclear of what she is taking and gets confused easily.  BS at home 100-200 . Does have polyuria.  No fever, chest pain, dyspnea, edema.  ? taking Mobic on/off.  >>refer to GI , d/c mobic   05/05/13 [facility] follow up  Admitted 8/19-25 for acute encephalopathy with DKA , possible Mennigitis Bacterial vs Viral . Tx with abx and antivirals. CT head with mod atrophy, no acute finding.  DM meds were adjusted.  Discharged to rehab center.  Returns today with son, feeling better. Has  been undergoing rehab with PT /OT. Getting stronger but still weak.  Is going to transfer this week to Assisted Living.  Has been referred to Endocrinology with Dr Dwyane Dee for DM management.-Lantus was changed to 18 u in am and 14 u in pm. Amaryl/Januvia were stopped.   Was referred back to Neuro for dementia - felt aricept +/- namenda may be indicated but will hold off for now d/t recent acute illness. Leave to PCP for decision in future.  >>no changes   06/22/2013 [facility] follow up  Recently admitted for dehydration and FTT complicated by acute on chronic renal failure.  Improved with IVF hydration.  Wt down 18lbs since last ov.  Not eating. Feels like everything tastes bad. Has seen Dr. Sharlett Iles in GI for decreased appetite , wt loss, and diverticulitis . Tx w/ flagyl and cipro . No improvement.  Insulin stopped in hospital due to hypoglycemia and started on januvia.  Was seen by endrocrinology last week. Insulin restated with dose adjusted by Dr. Dwyane Dee.  Labs last ov with Dr. Dwyane Dee scr tr up 2.6. Denies vomiting or diarrhea, fever, leg swelling , bloody stools , urinary symptoms. No worsening of memory or behavior issues.  Does feel anxious a lot . Denies depression or cyring episodes.  Feels she is more active , working with PT/OT/speech.  At [facility]  rec Discontinue Percocet , Calcium  And Chlortrimeton.  Decrease Spironolactone 25mg  Twice daily    Begin Glucerna drinks Three times a day  > referred to [facility].       11/22/2013 f/u ov/Wert re: abd pain, vaginal discharge/chronic back pain/ L middle finger ganglion cyst   Chief Complaint  Patient presents with  . Follow-up    Pt c/o increased SOB with exertion, nonprod cough with exertion.    now in assisted living with multiple chronic complaints  No reported fever or wt loss  Extremely sedentary and more limited by gen weakness than sob/ no change chronic cough   No obvious day to day or daytime variabilty or assoc chronic cough  or cp or chest tightness, subjective wheeze overt sinus or hb symptoms. No unusual exp hx or h/o childhood pna/ asthma or knowledge of premature birth.  Sleeping ok without nocturnal  or early am exacerbation  of respiratory  c/o's or need for noct saba. Also denies any obvious fluctuation of symptoms with weather or environmental changes or other aggravating or alleviating factors except as outlined above   Current Medications, Allergies, Complete Past Medical History, Past Surgical History, Family History, and Social History were reviewed in [employer] record.  ROS  The following are not active complaints unless bolded sore throat, dysphagia, dental problems, itching, sneezing,  nasal congestion or excess/ purulent secretions, ear ache,   fever, chills, sweats, unintended wt loss, pleuritic or exertional cp, hemoptysis,  orthopnea pnd or leg swelling, presyncope, palpitations, heartburn, abdominal pain, anorexia, nausea, vomiting, diarrhea  or change in bowel or urinary habits, change in stools or urine, dysuria,hematuria,  rash, arthralgias, visual complaints, headache, numbness weakness or ataxia or problems with walking or coordination,  change in mood/affect or memory.                 Family History:   asthma in her brother who also has hypertension  mother died of stroke, also had diabetes and arthritis  father had multiple myeloma  4 siblings all in relatively good health  Neg for dementia     Social History:  Patient never smoked.  positive for second-hand smoke exposure  exercises 2-3 times a week  1 cup caffeine a day  married  1 child       Past Medical History:  GERD (ICD-530.81)  ABDOMINAL PAIN, CHRONIC (ICD-789.00)  FATIGUE, CHRONIC (ICD-780.79)  DEGENERATIVE JOINT DISEASE (ICD-715.90)  - L5 radiculopathy confirmed by mri 08/2005  --refer to ortho August 24, 2008 >>> Dr Gladstone Lighter  HYPERTENSIVE CARDIOVASCULAR DISEASE (ICD-402.90)   OBESITY (ICD-278.00)  - Target wt = 158 for BMI < 30  - Referred [facility] 04/2005  GASTRITIS, CHRONIC (ICD-535.10)  BENIGN NEOPLASM OTH&UNSPEC SITE DIGESTIVE SYSTEM (ICD-211.9)  ESOPHAGEAL STRICTURE (ICD-530.3)..............................................Marland KitchenPatterson  - EGD 12/14/07  HIATAL HERNIA (ICD-553.3)  COLONIC POLYPS, ADENOMATOUS (ICD-211.3)  - Colonoscopy 12/14/07  DIVERTICULOSIS, COLON (ICD-562.10)  DYSLIPIDEMIA (ICD-272.4)  ARTHRITIS (ICD-716.90)  DM (ICD-250.00)  HYPERTENSION (ICD-401.9)  [place] 03/2003   11/22/2013  - Pneumovax 03/2003  - CPX August 28, 2010  -Mammogram 10/2009, ? abn asymmetry >>repeat ok, last mammo 4/13 >neg  MICROCYTIC ANEMA  - DX 02/03/09, Stool g neg x 6, rx Fe 65 mg /day > improving March 17, 2009  Edema--08/10/08--venous doppler neg for dvt, ?ruptured bakers cyst-right  March 20, 2009 repeat venous doppler > neg bilaterally  May 21, 2010 repeat venous dopplers >> neg bilat  Complex med regimen>>Meds reviewed  with pt education and computerized med calendar September 11, 2010 , 05/10/2011 , 03/18/2012 . 04/29/12 Memory loss  MMSE 03/18/2012 > 29/30 , Clock Draw abn , neg RPR , nml b12 , 09/29/2012 >30/30 , clock abn >refer to neuro  Probable dementia, rx held due to multiple co-morbidities  09/29/2012 RAST + w/ IGE at 319        Objective:   Physical Exam  obese ambulatory bf nad using partial rolling walker  wt 213 10/21/08 > 204 April 26, 2009 > 08/23/2011  191> 11/25/2011  201 > 03/02/2012  193 > 198 03/18/2012 > 186 04/15/2012 >191 05/28/2012 >184 06/08/2012 > 06/18/2012 186 > 189 09/16/2012 > 12/22/2012  198 > 183 03/24/2013  >168 05/05/13 >150 06/22/2013  > 07/13/2013   149 >162 08/23/2013 > 11/22/2013  161 HEENT: nl dentition, turbinates, and orophanx. Nl external ear canals without cough reflex  Neck without JVD/Nodes/TM  Lungs clear to A and P bilaterally without cough on insp or exp maneuvers  RRR no s3 or murmur or increase in P2,tr  edema   Abd soft min distended  and benign with nl excursion in the supine position. No bruits or organomegaly   EXT/Skin  :  venous insufficiency changes , no rash  Neuro:  No focal motor deficits , alert to person and place.         Assessment:

## 2013-11-24 NOTE — Assessment & Plan Note (Signed)
-   CTabd 04/01/13 1. Gas in the vagina, immediately adjacent to the mid sigmoid  colon, without demonstration of definite fistula. Rectal contrast  administration may be useful to more definitively exclude  persistent colovaginal fistula.  2. Descending and sigmoid diverticulosis - referred back to GI  11/22/13   No results found for this basename: NA, K, CL, CO2, BUN, CREATININE, GLUCOSE,  in the last 168 hours  Recent Labs Lab 11/22/13 1204  HGB 11.7*  HCT 37.2  WBC 7.8  PLT 235.0      Also vag d/c ? Related to abd pain which is chronic > referred to GYN

## 2013-11-24 NOTE — Assessment & Plan Note (Signed)
Adequate control on present rx, reviewed > no change in rx needed   

## 2013-11-24 NOTE — Assessment & Plan Note (Signed)
Lab Results  Component Value Date   CREATININE 2.1* 11/15/2013   CREATININE 2.0* 08/16/2013   CREATININE 2.7* 07/13/2013     No change in rx needed

## 2013-11-24 NOTE — Assessment & Plan Note (Signed)
Referred to gyn 11/22/2013

## 2013-11-24 NOTE — Progress Notes (Signed)
Quick Note:  ATC NA and no option to leave a msg, WCB ______

## 2013-11-26 ENCOUNTER — Encounter: Payer: Self-pay | Admitting: *Deleted

## 2013-11-26 NOTE — Progress Notes (Signed)
Quick Note:  Still unable to reach the pt  Will mail her letter ______

## 2013-12-27 ENCOUNTER — Ambulatory Visit (INDEPENDENT_AMBULATORY_CARE_PROVIDER_SITE_OTHER): Payer: Medicare PPO | Admitting: Endocrinology

## 2013-12-27 ENCOUNTER — Encounter: Payer: Self-pay | Admitting: Endocrinology

## 2013-12-27 VITALS — BP 118/72 | HR 81 | Temp 98.1°F | Resp 16 | Ht 60.0 in | Wt 165.8 lb

## 2013-12-27 DIAGNOSIS — N184 Chronic kidney disease, stage 4 (severe): Secondary | ICD-10-CM

## 2013-12-27 DIAGNOSIS — E1165 Type 2 diabetes mellitus with hyperglycemia: Principal | ICD-10-CM

## 2013-12-27 DIAGNOSIS — E1129 Type 2 diabetes mellitus with other diabetic kidney complication: Secondary | ICD-10-CM

## 2013-12-27 NOTE — Patient Instructions (Addendum)
Insulin LANTUS 12 UNITS IIN AM and 8 in pm  Give Prandin just before dinner, upto 30 min before  No added sugar to diet, no sweet drinks. Not over 2 starch servings per meal

## 2013-12-27 NOTE — Progress Notes (Signed)
Patient ID: Allison Rodgers, female   DOB: [birth year], 80 y.o.   MRN: [id number]   Reason for Appointment : Followup for Type 2 Diabetes  History of Present llness          Diagnosis: Type 2 diabetes mellitus, date of diagnosis: ? 2007       Past history:  Apparently she had blood sugars over 500 at the time of diagnosis She was started on insulin at the time of diagnosis and has been continuing on this with various regimens Also her weight has been previously significantly more, about 200 pounds  Recent history: The patient has still been at the nursing home and occasionally visiting her home.  She continues to be on low-dose insulin  Because of relatively high postprandial readings on her last visit she was switched from Januvia to Prandin 1 mg at suppertime. This may be administered around 4 PM an hour before supper Her blood sugars are still relatively high postprandially although not as consistently after supper She is still on Glucerna 3 times a day although she is doing 1/2 can tid INSULIN regimen: Lantus 10 units twice daily  Glucose monitoring:  done 2 times  a day.   Glucometer: ?     Blood Glucose readings  PREMEAL Breakfast Lunch Dinner Bedtime Overall  Glucose range:  92-132   123-193   148-236   151-240    Mean/median:         Hypoglycemia : None recently             Meals: 3 meals per day.   Physical activity: exercise: Trying to walk a little indoors Dietician consultations: Several years ago             Retinal exam: Most recent:.2014    Wt Readings from Last 3 Encounters:  12/27/13 165 lb 12.8 oz (75.206 kg)  11/22/13 161 lb (73.029 kg)  11/17/13 160 lb (72.576 kg)   LABS:  Lab Results  Component Value Date   HGBA1C 6.9* 11/15/2013   HGBA1C 6.3 06/07/2013   HGBA1C 6.2* 05/30/2013   Lab Results  Component Value Date   MICROALBUR 0.2 11/15/2013   LDLCALC 72 11/15/2013   CREATININE 2.1* 11/15/2013       Medication List       This list is accurate as of:  12/27/13  1:37 PM.  Always use your most recent med list.               acetaminophen 500 MG tablet  Commonly known as:  TYLENOL  500-1,000 mg every 6 (six) hours as needed for pain. Per bottle as needed for pain     ALIGN 4 MG Caps  Take 1 tablet by mouth daily.     ALPRAZolam 0.25 MG tablet  Commonly known as:  XANAX  Take 1 tablet (0.25 mg total) by mouth every 6 (six) hours as needed for anxiety.     aspirin 81 MG chewable tablet  Chew 81 mg by mouth daily.     dorzolamide 2 % ophthalmic solution  Commonly known as:  TRUSOPT  Place 1 drop into both eyes at bedtime.     feeding supplement (GLUCERNA SHAKE) Liqd  Take 237 mLs by mouth 3 (three) times daily between meals.     fluocinonide cream 0.05 %  Commonly known as:  LIDEX  Apply 1 application topically 2 (two) times daily as needed.     furosemide 20 MG tablet  Commonly known as:  LASIX  Take 1 tablet (20 mg total) by mouth daily.     guaiFENesin-dextromethorphan 100-10 MG/5ML syrup  Commonly known as:  ROBITUSSIN DM  Take 5 mLs by mouth 3 (three) times daily as needed for cough.     insulin glargine 100 UNIT/ML injection  Commonly known as:  LANTUS  Inject 10 Units into the skin 2 (two) times daily.     loperamide 2 MG capsule  Commonly known as:  IMODIUM  Take by mouth as needed for diarrhea or loose stools.     pantoprazole 40 MG tablet  Commonly known as:  PROTONIX  Take 1 tablet (40 mg total) by mouth 2 (two) times daily.     repaglinide 1 MG tablet  Commonly known as:  PRANDIN  Take 1 tablet (1 mg total) by mouth daily before supper.     sodium chloride 0.65 % Soln nasal spray  Commonly known as:  OCEAN  Place 2 sprays into both nostrils as needed for congestion.     sorbitol 70 % solution  Take 15 mLs by mouth daily as needed.     spironolactone 25 MG tablet  Commonly known as:  ALDACTONE  Take 25 mg by mouth 2 (two) times daily.     XALATAN 0.005 % ophthalmic solution  Generic drug:   latanoprost  1 drop at bedtime.        Allergies:  Allergies  Allergen Reactions  . Penicillins Other (See Comments)    REACTION: causes hallucinations  . Sulfamethoxazole-Trimethoprim Swelling    REACTION: rash  . Bactrim     Unknown per mar  . Trimethoprim     Unknown per mar     Past Medical History  Diagnosis Date  . GERD (gastroesophageal reflux disease)   . Chronic abdominal pain   . DJD (degenerative joint disease)   . Hypertensive cardiovascular disease   . Obesity   . Gastritis, chronic   . 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 Esophageal stricture   . Hiatal hernia   . Hx of adenomatous colonic polyps   . Diverticulosis   . Dyslipidemia   . Arthritis   . DM (diabetes mellitus)   . Hypertension   . Microcytic anemia   . Edema   . Renal disorder   . Bacterial meningitis     Past Surgical History  Procedure Laterality Date  . Appendectomy    . Cholecystectomy  1999  . Vesicovaginal fistula closure w/ tah  1957  . Bilateral oophorectomy  1957  . Hernia repair  08/6107    umbilical and ventral hernia repair by Dr. Margot Chimes  . Rotator cuff repair  7/05    right  . Back surgery      Family History  Problem Relation Age of Onset  . Stroke Mother   . Cancer Father   . Hypertension Other     Social History:  reports that she has never smoked. She has never used smokeless tobacco. She reports that she does not drink alcohol or use illicit drugs.    Review of Systems    Lipids: She is currently not on any medications for this  Lab Results  Component Value Date   CHOL 146 11/15/2013   HDL 55.60 11/15/2013   LDLCALC 72 11/15/2013   TRIG 90.0 11/15/2013   CHOLHDL 3 11/15/2013  \   She is still taking diuretics including spironolactone but has edema. Also has mild hyponatremia  Physical Examination:  BP 118/72  Pulse 81  Temp(Src) 98.1 F (36.7 C)  Resp 16  Ht 5' (1.524 m)  Wt 165 lb 12.8 oz (75.206 kg)  BMI  32.38 kg/m2  SpO2 98%     ASSESSMENT:  Diabetes type 2   Her glucose readings seem to be fair with using 10 units  Lantus twice a day and low-dose Prandin. A1c appears to be lower than expected for her home blood sugars which are probably averaging about 150-160 She  does have relatively high readings after certain meals but not consistently, this may be related to her diet not being restricted  Her weight is stable  PLAN:   Change Lantus to 12 units in the morning and reduce evening dose to 8 units Given written instructions for nursing home to give her Prandin within 30 minutes at dinnertime Consider increasing Prandin to before lunch also  Followup in 3 months unless blood sugars are not controlled  Elayne Snare 12/27/2013, 1:37 PM

## 2014-02-16 ENCOUNTER — Encounter: Payer: Self-pay | Admitting: Podiatry

## 2014-02-16 ENCOUNTER — Ambulatory Visit (INDEPENDENT_AMBULATORY_CARE_PROVIDER_SITE_OTHER): Payer: Medicare HMO | Admitting: Podiatry

## 2014-02-16 VITALS — BP 138/56 | HR 66 | Resp 12

## 2014-02-16 DIAGNOSIS — M79609 Pain in unspecified limb: Secondary | ICD-10-CM

## 2014-02-16 DIAGNOSIS — B351 Tinea unguium: Secondary | ICD-10-CM

## 2014-02-16 DIAGNOSIS — M79673 Pain in unspecified foot: Secondary | ICD-10-CM

## 2014-02-16 NOTE — Progress Notes (Signed)
Patient ID: Allison Rodgers, female   DOB: 1923-11-12, 80 y.o.   MRN: [id number]  Subjective: This patient presents complaining of painful toenails when walking wearing shoes.  Objective: Black female orientated x3  Dermatological: Hypertrophic, elongated, discolored, incurvated toenails 6-10  Assessment: Symptomatic onychomycoses 6-10  Plan: Nails x10 are debrided without any bleeding  Reappoint at three-month

## 2014-02-18 ENCOUNTER — Encounter: Payer: Self-pay | Admitting: Internal Medicine

## 2014-02-18 DIAGNOSIS — Z0000 Encounter for general adult medical examination without abnormal findings: Secondary | ICD-10-CM | POA: Insufficient documentation

## 2014-03-10 ENCOUNTER — Encounter: Payer: Self-pay | Admitting: Internal Medicine

## 2014-03-28 ENCOUNTER — Other Ambulatory Visit (INDEPENDENT_AMBULATORY_CARE_PROVIDER_SITE_OTHER): Payer: Medicare PPO

## 2014-03-28 DIAGNOSIS — IMO0001 Reserved for inherently not codable concepts without codable children: Secondary | ICD-10-CM

## 2014-03-28 DIAGNOSIS — E119 Type 2 diabetes mellitus without complications: Secondary | ICD-10-CM

## 2014-03-28 DIAGNOSIS — E1165 Type 2 diabetes mellitus with hyperglycemia: Principal | ICD-10-CM

## 2014-03-28 DIAGNOSIS — E1129 Type 2 diabetes mellitus with other diabetic kidney complication: Secondary | ICD-10-CM

## 2014-03-28 LAB — BASIC METABOLIC PANEL
BUN: 49 mg/dL — ABNORMAL HIGH (ref 6–23)
CALCIUM: 9.2 mg/dL (ref 8.4–10.5)
CHLORIDE: 106 meq/L (ref 96–112)
CO2: 27 mEq/L (ref 19–32)
Creatinine, Ser: 2.5 mg/dL — ABNORMAL HIGH (ref 0.4–1.2)
GFR: 23.45 mL/min — ABNORMAL LOW (ref >60.00)
GLUCOSE: 227 mg/dL — AB (ref 70–99)
Potassium: 4.3 mEq/L (ref 3.5–5.1)
Sodium: 141 mEq/L (ref 135–145)

## 2014-03-28 LAB — HEMOGLOBIN A1C: Hgb A1c MFr Bld: 6.3 % (ref 4.6–6.5)

## 2014-03-31 ENCOUNTER — Encounter: Payer: Self-pay | Admitting: Endocrinology

## 2014-03-31 ENCOUNTER — Ambulatory Visit (INDEPENDENT_AMBULATORY_CARE_PROVIDER_SITE_OTHER): Payer: Medicare PPO | Admitting: Endocrinology

## 2014-03-31 VITALS — BP 138/63 | HR 62 | Temp 98.3°F | Resp 16 | Ht 60.0 in | Wt 172.4 lb

## 2014-03-31 DIAGNOSIS — E1165 Type 2 diabetes mellitus with hyperglycemia: Principal | ICD-10-CM

## 2014-03-31 DIAGNOSIS — E1129 Type 2 diabetes mellitus with other diabetic kidney complication: Secondary | ICD-10-CM

## 2014-03-31 NOTE — Progress Notes (Signed)
Patient ID: Allison Rodgers, female   DOB: Jul 15, 1924, 80 y.o.   MRN: [id number]   Reason for Appointment : Followup for Type 2 Diabetes  History of Present llness          Diagnosis: Type 2 diabetes mellitus, date of diagnosis: ? 2007       Past history:  Apparently she had blood sugars over 500 at the time of diagnosis She was started on insulin at the time of diagnosis and has been continued on insulin with various regimens Also her weight had been previously significantly more, about 200 pounds  Recent history: The patient is still at the nursing home and occasionally visiting her home.  She continues to be on low-dose insulin  Because of relatively high postprandial readings  she was switched from Januvia to Prandin 1 mg at suppertime. This is being  be administered around 4 PM sometime before supper. With this blood sugars are fairly good most of the time after supper Because of higher readings in the afternoons and low normal readings in the mornings her Lantus was increased in the morning and reduced in the evening in 5/50 With this sugars appear to be more consistent Her blood sugars are still  high postprandially  after breakfast and this may be from her getting more carbohydrate with juice, fruit She is still on Glucerna with only 1 can a day  INSULIN regimen: Lantus 12 units a.m. and 8 units p.m.  Glucose monitoring:  done 2 times  a day.   Glucometer: ?     Blood Glucose readings  PREMEAL Breakfast Lunch Dinner Bedtime Overall  Glucose range: 103-123 136-254 140-186 118-231   Mean/median:        Hypoglycemia : None recently             Meals: 3 meals per day.   Physical activity: exercise: Trying to walk  Dietician consultations: Several years ago             Retinal exam: Most recent:.2014    Wt Readings from Last 3 Encounters:  03/31/14 172 lb 6.4 oz (78.2 kg)  12/27/13 165 lb 12.8 oz (75.206 kg)  11/22/13 161 lb (73.029 kg)   LABS:  Lab Results  Component  Value Date   HGBA1C 6.3 03/28/2014   HGBA1C 6.9* 11/15/2013   HGBA1C 6.3 06/07/2013   Lab Results  Component Value Date   MICROALBUR 0.2 11/15/2013   LDLCALC 72 11/15/2013   CREATININE 2.5* 03/28/2014       Medication List       This list is accurate as of: 03/31/14 11:13 AM.  Always use your most recent med list.               acetaminophen 500 MG tablet  Commonly known as:  TYLENOL  500-1,000 mg every 6 (six) hours as needed for pain. Per bottle as needed for pain     ALIGN 4 MG Caps  Take 1 tablet by mouth daily.     ALPRAZolam 0.25 MG tablet  Commonly known as:  XANAX  Take 1 tablet (0.25 mg total) by mouth every 6 (six) hours as needed for anxiety.     aspirin 81 MG chewable tablet  Chew 81 mg by mouth daily.     dorzolamide 2 % ophthalmic solution  Commonly known as:  TRUSOPT  Place 1 drop into both eyes at bedtime.     feeding supplement (GLUCERNA SHAKE) Liqd  Take 237 mLs by mouth  3 (three) times daily between meals.     fluconazole 200 MG tablet  Commonly known as:  DIFLUCAN     fluocinonide cream 0.05 %  Commonly known as:  LIDEX  Apply 1 application topically 2 (two) times daily as needed.     furosemide 20 MG tablet  Commonly known as:  LASIX  Take 1 tablet (20 mg total) by mouth daily.     guaiFENesin-dextromethorphan 100-10 MG/5ML syrup  Commonly known as:  ROBITUSSIN DM  Take 5 mLs by mouth 3 (three) times daily as needed for cough.     insulin glargine 100 UNIT/ML injection  Commonly known as:  LANTUS  Inject 10 Units into the skin 2 (two) times daily.     loperamide 2 MG capsule  Commonly known as:  IMODIUM  Take by mouth as needed for diarrhea or loose stools.     metroNIDAZOLE 500 MG tablet  Commonly known as:  FLAGYL     nystatin 100000 UNIT/ML suspension  Commonly known as:  MYCOSTATIN     pantoprazole 40 MG tablet  Commonly known as:  PROTONIX  Take 1 tablet (40 mg total) by mouth 2 (two) times daily.     repaglinide 1 MG  tablet  Commonly known as:  PRANDIN  Take 1 tablet (1 mg total) by mouth daily before supper.     sodium chloride 0.65 % Soln nasal spray  Commonly known as:  OCEAN  Place 2 sprays into both nostrils as needed for congestion.     sorbitol 70 % solution  Take 15 mLs by mouth daily as needed.     spironolactone 25 MG tablet  Commonly known as:  ALDACTONE  Take 25 mg by mouth 2 (two) times daily.     XALATAN 0.005 % ophthalmic solution  Generic drug:  latanoprost  1 drop at bedtime.        Allergies:  Allergies  Allergen Reactions  . Penicillins Other (See Comments)    REACTION: causes hallucinations  . Sulfamethoxazole-Trimethoprim Swelling    REACTION: rash  . Bactrim     Unknown per mar  . Trimethoprim     Unknown per mar     Past Medical History  Diagnosis Date  . GERD (gastroesophageal reflux disease)   . Chronic abdominal pain   . DJD (degenerative joint disease)   . Hypertensive cardiovascular disease   . Obesity   . Gastritis, chronic   . 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 Esophageal stricture   . Hiatal hernia   . Hx of adenomatous colonic polyps   . Diverticulosis   . Dyslipidemia   . Arthritis   . DM (diabetes mellitus)   . Hypertension   . Microcytic anemia   . Edema   . Renal disorder   . Bacterial meningitis     Past Surgical History  Procedure Laterality Date  . Appendectomy    . Cholecystectomy  1999  . Vesicovaginal fistula closure w/ tah  1957  . Bilateral oophorectomy  1957  . Hernia repair  08/4429    umbilical and ventral hernia repair by Dr. Margot Chimes  . Rotator cuff repair  7/05    right  . Back surgery      Family History  Problem Relation Age of Onset  . Stroke Mother   . Cancer Father   . Hypertension Other     Social History:  reports that she has never smoked. She has  never used smokeless tobacco. She reports that she does not drink alcohol or use illicit drugs.    Review of  Systems    Lipids: She is currently not on any medications for this  Lab Results  Component Value Date   CHOL 146 11/15/2013   HDL 55.60 11/15/2013   LDLCALC 72 11/15/2013   TRIG 90.0 11/15/2013   CHOLHDL 3 11/15/2013  \   She is still taking diuretics including spironolactone. Also has had mild hyponatremia   Physical Examination:  BP 138/63  Pulse 62  Temp(Src) 98.3 F (36.8 C)  Resp 16  Ht 5' (1.524 m)  Wt 172 lb 6.4 oz (78.2 kg)  BMI 33.67 kg/m2  SpO2 93%  Trace left leg edema    ASSESSMENT:  Diabetes type 2   Her glucose readings seem to be fairly good with using current regimen of Lantus twice a day and low-dose Prandin. A1c appears to be lower than expected for her home blood sugars but has improved and she has no hypoglycemia She  does have relatively high readings after breakfast and will try to modify her diet rather than adding mealtime insulin Her weight has gone up and she has limited activity level  PLAN:   Continue Lantus  12 units in the morning and  8 units in the evening She will stop drinking juice at breakfast  Given written instructions for nursing home  Consider adding Prandin to before breakfast also  Followup in 3 months unless blood sugars are not controlled  Sherlynn Tourville 03/31/2014, 11:13 AM

## 2014-05-18 ENCOUNTER — Ambulatory Visit (INDEPENDENT_AMBULATORY_CARE_PROVIDER_SITE_OTHER): Payer: Medicare HMO | Admitting: Podiatry

## 2014-05-18 ENCOUNTER — Encounter: Payer: Self-pay | Admitting: Podiatry

## 2014-05-18 VITALS — BP 142/86 | HR 69 | Resp 12

## 2014-05-18 DIAGNOSIS — M79676 Pain in unspecified toe(s): Secondary | ICD-10-CM

## 2014-05-18 DIAGNOSIS — B351 Tinea unguium: Secondary | ICD-10-CM

## 2014-05-18 NOTE — Progress Notes (Signed)
Patient ID: Allison Rodgers, female   DOB: 1924/04/08, 80 y.o.   MRN: [id number]  Subjective: This patient presents for ongoing debridement of painful mycotic toenails.  Objective: The toenails are elongated, hypertrophic, incurvated 6-10  Assessment: Symptomatic onychomycoses 6-10  Plan: Nails x10 are debrided without a bleeding  Reappoint x3 months

## 2014-05-18 NOTE — Patient Instructions (Signed)
Debridement of mycotic toenails today without a bleeding

## 2014-07-01 ENCOUNTER — Ambulatory Visit (INDEPENDENT_AMBULATORY_CARE_PROVIDER_SITE_OTHER): Payer: Medicare PPO | Admitting: Endocrinology

## 2014-07-01 ENCOUNTER — Encounter: Payer: Self-pay | Admitting: Endocrinology

## 2014-07-01 VITALS — BP 141/85 | HR 57 | Temp 98.3°F | Resp 14 | Ht 61.0 in | Wt 181.6 lb

## 2014-07-01 DIAGNOSIS — E1165 Type 2 diabetes mellitus with hyperglycemia: Secondary | ICD-10-CM

## 2014-07-01 DIAGNOSIS — IMO0002 Reserved for concepts with insufficient information to code with codable children: Secondary | ICD-10-CM

## 2014-07-01 DIAGNOSIS — E1129 Type 2 diabetes mellitus with other diabetic kidney complication: Secondary | ICD-10-CM

## 2014-07-01 LAB — COMPREHENSIVE METABOLIC PANEL
ALBUMIN: 3.6 g/dL (ref 3.5–5.2)
ALT: 14 U/L (ref 0–35)
AST: 17 U/L (ref 0–37)
Alkaline Phosphatase: 107 U/L (ref 39–117)
BILIRUBIN TOTAL: 0.4 mg/dL (ref 0.2–1.2)
BUN: 53 mg/dL — ABNORMAL HIGH (ref 6–23)
CHLORIDE: 107 meq/L (ref 96–112)
CO2: 25 mEq/L (ref 19–32)
Calcium: 9.1 mg/dL (ref 8.4–10.5)
Creatinine, Ser: 2.5 mg/dL — ABNORMAL HIGH (ref 0.4–1.2)
GFR: 23.22 mL/min — AB (ref >60.00)
GLUCOSE: 190 mg/dL — AB (ref 70–99)
Potassium: 4.4 mEq/L (ref 3.5–5.1)
SODIUM: 138 meq/L (ref 135–145)
Total Protein: 7.5 g/dL (ref 6.0–8.3)

## 2014-07-01 LAB — HEMOGLOBIN A1C: Hgb A1c MFr Bld: 6.5 % (ref 4.6–6.5)

## 2014-07-01 NOTE — Progress Notes (Signed)
Patient ID: Allison Rodgers, female   DOB: 08/10/1924, 80 y.o.   MRN: [id number]   Reason for Appointment : Followup for Type 2 Diabetes  History of Present llness          Diagnosis: Type 2 diabetes mellitus, date of diagnosis: ? 2007       Past history:  Apparently she had blood sugars over 500 at the time of diagnosis She was started on insulin at the time of diagnosis and has been continued on insulin with various regimens Also her weight had been previously significantly more, about 200 pounds  Recent history: The patient is still at the nursing home and occasionally visiting her home.  She continues to be on low-dose Lantus insulin which has not been changed for sometime Because of high postprandial readings after supper she is also taking Prandin before supper. This is being  be administered around 4 PM sometime before supper.  Again her blood sugars are fairly good most of the time after supper Her blood sugars appear to be very similar to her previous visit She thinks her blood sugars are higher at lunchtime because of getting set up with her pancakes but she thinks this is a diabetic syrup.  Also getting her relatively high carbohydrate intake along with her pancakes Her A1c has been fairly good previously She is still on Glucerna but using small amounts when she is not eating well INSULIN regimen: Lantus 12 units a.m. and 8 units p.m.  Glucose monitoring:  done 2 times  a day.   Glucometer: ?     Blood Glucose readings  PREMEAL Breakfast Lunch Dinner Bedtime Overall  Glucose range: 97-142 153-239 127-197 119-203   Mean/median:        Hypoglycemia : None recently             Meals: 3 meals per day.   Physical activity: exercise: Trying to walk in her nursing home Dietician consultations: Several years ago             Retinal exam: Most recent:.2014    Wt Readings from Last 3 Encounters:  07/01/14 181 lb 9.6 oz (82.373 kg)  03/31/14 172 lb 6.4 oz (78.2 kg)  12/27/13  165 lb 12.8 oz (75.206 kg)   LABS:  Lab Results  Component Value Date   HGBA1C 6.3 03/28/2014   HGBA1C 6.9* 11/15/2013   HGBA1C 6.3 06/07/2013   Lab Results  Component Value Date   MICROALBUR 0.2 11/15/2013   LDLCALC 72 11/15/2013   CREATININE 2.5* 03/28/2014       Medication List       This list is accurate as of: 07/01/14 11:28 AM.  Always use your most recent med list.               acetaminophen 500 MG tablet  Commonly known as:  TYLENOL  500-1,000 mg every 6 (six) hours as needed for pain. Per bottle as needed for pain     ALIGN 4 MG Caps  Take 1 tablet by mouth daily.     ALPRAZolam 0.25 MG tablet  Commonly known as:  XANAX  Take 1 tablet (0.25 mg total) by mouth every 6 (six) hours as needed for anxiety.     aspirin 81 MG chewable tablet  Chew 81 mg by mouth daily.     dorzolamide 2 % ophthalmic solution  Commonly known as:  TRUSOPT  Place 1 drop into both eyes at bedtime.     feeding supplement (  GLUCERNA SHAKE) Liqd  Take 237 mLs by mouth 3 (three) times daily between meals.     fluconazole 200 MG tablet  Commonly known as:  DIFLUCAN     fluocinonide cream 0.05 %  Commonly known as:  LIDEX  Apply 1 application topically 2 (two) times daily as needed.     furosemide 20 MG tablet  Commonly known as:  LASIX  Take 1 tablet (20 mg total) by mouth daily.     guaiFENesin-dextromethorphan 100-10 MG/5ML syrup  Commonly known as:  ROBITUSSIN DM  Take 5 mLs by mouth 3 (three) times daily as needed for cough.     insulin glargine 100 UNIT/ML injection  Commonly known as:  LANTUS  Inject 12 Units into the skin 2 (two) times daily. Inject 12 units in am and 8 units in pm     loperamide 2 MG capsule  Commonly known as:  IMODIUM  Take by mouth as needed for diarrhea or loose stools.     metroNIDAZOLE 500 MG tablet  Commonly known as:  FLAGYL     nystatin 100000 UNIT/ML suspension  Commonly known as:  MYCOSTATIN     pantoprazole 40 MG tablet   Commonly known as:  PROTONIX  Take 1 tablet (40 mg total) by mouth 2 (two) times daily.     repaglinide 1 MG tablet  Commonly known as:  PRANDIN  Take 1 tablet (1 mg total) by mouth daily before supper.     sodium chloride 0.65 % Soln nasal spray  Commonly known as:  OCEAN  Place 2 sprays into both nostrils as needed for congestion.     sorbitol 70 % solution  Take 15 mLs by mouth daily as needed.     spironolactone 25 MG tablet  Commonly known as:  ALDACTONE  Take 25 mg by mouth 2 (two) times daily.     XALATAN 0.005 % ophthalmic solution  Generic drug:  latanoprost  1 drop at bedtime.        Allergies:  Allergies  Allergen Reactions  . Penicillins Other (See Comments)    REACTION: causes hallucinations  . Sulfamethoxazole-Trimethoprim Swelling    REACTION: rash  . Bactrim     Unknown per mar  . Trimethoprim     Unknown per mar     Past Medical History  Diagnosis Date  . GERD (gastroesophageal reflux disease)   . Chronic abdominal pain   . DJD (degenerative joint disease)   . Hypertensive cardiovascular disease   . Obesity   . Gastritis, chronic   . 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 Esophageal stricture   . Hiatal hernia   . Hx of adenomatous colonic polyps   . Diverticulosis   . Dyslipidemia   . Arthritis   . DM (diabetes mellitus)   . Hypertension   . Microcytic anemia   . Edema   . Renal disorder   . Bacterial meningitis     Past Surgical History  Procedure Laterality Date  . Appendectomy    . Cholecystectomy  1999  . Vesicovaginal fistula closure w/ tah  1957  . Bilateral oophorectomy  1957  . Hernia repair  01/629    umbilical and ventral hernia repair by Dr. Margot Chimes  . Rotator cuff repair  7/05    right  . Back surgery      Family History  Problem Relation Age of Onset  . Stroke Mother   . Cancer Father   .  Hypertension Other     Social History:  reports that she has never smoked. She has  never used smokeless tobacco. She reports that she does not drink alcohol or use illicit drugs.    Review of Systems    Lipids: She is currently not on any medications for this  Lab Results  Component Value Date   CHOL 146 11/15/2013   HDL 55.60 11/15/2013   LDLCALC 72 11/15/2013   TRIG 90.0 11/15/2013   CHOLHDL 3 11/15/2013  \   She is still taking diuretics including spironolactone. Also has had mild hyponatremia   Physical Examination:  BP 141/85 mmHg  Pulse 57  Temp(Src) 98.3 F (36.8 C)  Resp 14  Ht 5\' 1"  (1.549 m)  Wt 181 lb 9.6 oz (82.373 kg)  BMI 34.33 kg/m2  SpO2 97%     ASSESSMENT:  Diabetes type 2   Her glucose readings seem to be fairly good with using current regimen of Lantus twice a day and low-dose Prandin before supper. A1c needs to be checked Previously A1c has been  lower than expected for her home blood sugars but has improved  For her age and multiple medical problems her control is adequate She  does have relatively high readings after breakfast and will try to modify her diet with less carbohydrate, may also leave off sausage in the morning Her weight has gone up further, unlikely to be from insulin since it is a small dose Does not need supplemental Glucerna  PLAN:   Continue Lantus unchanged Given written instructions for nursing home  Consider adding Prandin to before breakfast also  Followup in 3 months unless blood sugars are not controlled  Corben Auzenne 07/01/2014, 11:28 AM   Addendum: A1c about the same  Office Visit on 07/01/2014  Component Date Value Ref Range Status  . Hgb A1c MFr Bld 07/01/2014 6.5  4.6 - 6.5 % Final   Glycemic Control Guidelines for People with Diabetes:Non Diabetic:  <6%Goal of Therapy: <7%Additional Action Suggested:  >8%   . Sodium 07/01/2014 138  135 - 145 mEq/L Final  . Potassium 07/01/2014 4.4  3.5 - 5.1 mEq/L Final  . Chloride 07/01/2014 107  96 - 112 mEq/L Final  . CO2 07/01/2014 25  19 - 32 mEq/L  Final  . Glucose, Bld 07/01/2014 190* 70 - 99 mg/dL Final  . BUN 07/01/2014 53* 6 - 23 mg/dL Final  . Creatinine, Ser 07/01/2014 2.5* 0.4 - 1.2 mg/dL Final  . Total Bilirubin 07/01/2014 0.4  0.2 - 1.2 mg/dL Final  . Alkaline Phosphatase 07/01/2014 107  39 - 117 U/L Final  . AST 07/01/2014 17  0 - 37 U/L Final  . ALT 07/01/2014 14  0 - 35 U/L Final  . Total Protein 07/01/2014 7.5  6.0 - 8.3 g/dL Final  . Albumin 07/01/2014 3.6  3.5 - 5.2 g/dL Final  . Calcium 07/01/2014 9.1  8.4 - 10.5 mg/dL Final  . GFR 07/01/2014 23.22* >60.00 mL/min Final

## 2014-08-17 ENCOUNTER — Encounter: Payer: Self-pay | Admitting: Podiatry

## 2014-08-17 ENCOUNTER — Ambulatory Visit (INDEPENDENT_AMBULATORY_CARE_PROVIDER_SITE_OTHER): Payer: Medicare HMO | Admitting: Podiatry

## 2014-08-17 DIAGNOSIS — B351 Tinea unguium: Secondary | ICD-10-CM

## 2014-08-17 DIAGNOSIS — M79676 Pain in unspecified toe(s): Secondary | ICD-10-CM

## 2014-08-17 NOTE — Patient Instructions (Signed)
Return every 3 months for debridement of mycotic toenails 

## 2014-08-17 NOTE — Progress Notes (Signed)
Patient ID: Allison Rodgers, female   DOB: [birth year], 81 y.o.   MRN: [id number]  Subjective: This patient presents again complaining of painful mycotic toenails  Objective: The toenails are elongated, incurvated, discolored and tender to palpation 6-10  Assessment: Symptomatic onychomycosis 6-10 Diabetic with a history of neurological manifestations  Plan: Debrided toenails 10 without a bleeding  Reappoint 3 months

## 2014-09-06 ENCOUNTER — Other Ambulatory Visit: Payer: Self-pay | Admitting: Endocrinology

## 2014-09-06 NOTE — Progress Notes (Unsigned)
Sugar log received from nursing home shows fasting glucose below 200 but nonfasting readings ranging from 169-320, highest at lunch and dinner Will start her on NovoLog insulin 4 units before breakfast and lunch and 3 units before dinner Order to be faxed to nursing home, order to be sent to pharmacy if needed

## 2014-09-26 ENCOUNTER — Telehealth: Payer: Self-pay | Admitting: Endocrinology

## 2014-09-26 NOTE — Telephone Encounter (Signed)
[facility] [employer] message: Durenda Age Dr. Is calling regarding pt having blood sugar 57 with med questions sent to urgent MD for assistance

## 2014-09-28 ENCOUNTER — Other Ambulatory Visit (INDEPENDENT_AMBULATORY_CARE_PROVIDER_SITE_OTHER): Payer: Medicare PPO

## 2014-09-28 DIAGNOSIS — IMO0002 Reserved for concepts with insufficient information to code with codable children: Secondary | ICD-10-CM

## 2014-09-28 DIAGNOSIS — E1165 Type 2 diabetes mellitus with hyperglycemia: Secondary | ICD-10-CM

## 2014-09-28 LAB — HEMOGLOBIN A1C: Hgb A1c MFr Bld: 7.1 % — ABNORMAL HIGH (ref 4.6–6.5)

## 2014-09-28 LAB — GLUCOSE, RANDOM: Glucose, Bld: 108 mg/dL — ABNORMAL HIGH (ref 70–99)

## 2014-09-29 NOTE — Telephone Encounter (Signed)
Be advised.

## 2014-10-03 ENCOUNTER — Encounter: Payer: Self-pay | Admitting: Endocrinology

## 2014-10-03 ENCOUNTER — Ambulatory Visit (INDEPENDENT_AMBULATORY_CARE_PROVIDER_SITE_OTHER): Payer: Medicare PPO | Admitting: Endocrinology

## 2014-10-03 VITALS — BP 120/42 | HR 61 | Temp 98.3°F | Resp 16 | Ht 61.0 in | Wt 186.6 lb

## 2014-10-03 DIAGNOSIS — E1165 Type 2 diabetes mellitus with hyperglycemia: Secondary | ICD-10-CM

## 2014-10-03 DIAGNOSIS — IMO0002 Reserved for concepts with insufficient information to code with codable children: Secondary | ICD-10-CM

## 2014-10-03 NOTE — Patient Instructions (Addendum)
Stop Novolog at lunch  Dietician to see her at nursing home  Walk 10 minutes daily at least  Stop Repeglinide

## 2014-10-03 NOTE — Progress Notes (Signed)
Patient ID: Allison Rodgers, female   DOB: 1924-04-29, 81 y.o.   MRN: [id number]   Reason for Appointment : Followup for Type 2 Diabetes  History of Present llness          Diagnosis: Type 2 diabetes mellitus, date of diagnosis: ? 2007       Past history:  Apparently she had blood sugars over 500 at the time of diagnosis She was started on insulin at the time of diagnosis and has been continued on insulin with various regimens Also her weight had been previously significantly more, about 200 pounds  Recent history:  Her blood sugars starting increasing consistently in late December/early January especially after meals.  Her highest blood sugar was 356 With this she was started on NovoLog 5 units before meals at the end of January Since fasting blood sugars were fairly good her Lantus was not increase She does not think she had changed her diet.  Also since she did not like drinking Glucerna she is eating a normal meal especially at lunchtime. She has however been gaining weight consistently; she used to weigh about 200 pounds prior to her medical problems starting A1c has increased somewhat as expected She is experiencing occasional low blood sugars recently before supper time, lowest reading 55 with only mild symptoms  INSULIN regimen: Lantus 12 units a.m. and 8 units p.m..  NovoLog 5 units before meals  Glucose monitoring:  done 2 times  a day.   Glucometer: ?     Blood Glucose readings  PRE-MEAL Breakfast Lunch Dinner Bedtime Overall  Glucose range:  103-155 133-212 55-238  99-174   Mean/median:          Hypoglycemia : As above        Meals: 3 meals per day.  generally portions are small.    Physical activity: exercise: Trying to walk in her nursing home, her son say that she is not doing this often Dietician consultations: Several years ago             Retinal exam: Most recent:.2014    Wt Readings from Last 3 Encounters:  10/03/14 186 lb 9.6 oz (84.641 kg)    07/01/14 181 lb 9.6 oz (82.373 kg)  03/31/14 172 lb 6.4 oz (78.2 kg)   LABS:  Lab Results  Component Value Date   HGBA1C 7.1* 09/28/2014   HGBA1C 6.5 07/01/2014   HGBA1C 6.3 03/28/2014   Lab Results  Component Value Date   MICROALBUR 0.2 11/15/2013   LDLCALC 72 11/15/2013   CREATININE 2.5* 07/01/2014       Medication List       This list is accurate as of: 10/03/14 11:17 AM.  Always use your most recent med list.               acetaminophen 500 MG tablet  Commonly known as:  TYLENOL  500-1,000 mg every 6 (six) hours as needed for pain. Per bottle as needed for pain     ALIGN 4 MG Caps  Take 1 tablet by mouth daily.     ALPRAZolam 0.25 MG tablet  Commonly known as:  XANAX  Take 1 tablet (0.25 mg total) by mouth every 6 (six) hours as needed for anxiety.     aspirin 81 MG chewable tablet  Chew 81 mg by mouth daily.     dorzolamide 2 % ophthalmic solution  Commonly known as:  TRUSOPT  Place 1 drop into both eyes at bedtime.  feeding supplement (GLUCERNA SHAKE) Liqd  Take 237 mLs by mouth 3 (three) times daily between meals.     fluconazole 200 MG tablet  Commonly known as:  DIFLUCAN     fluocinonide cream 0.05 %  Commonly known as:  LIDEX  Apply 1 application topically 2 (two) times daily as needed.     furosemide 20 MG tablet  Commonly known as:  LASIX  Take 1 tablet (20 mg total) by mouth daily.     gabapentin 100 MG capsule  Commonly known as:  NEURONTIN     guaiFENesin-dextromethorphan 100-10 MG/5ML syrup  Commonly known as:  ROBITUSSIN DM  Take 5 mLs by mouth 3 (three) times daily as needed for cough.     insulin glargine 100 UNIT/ML injection  Commonly known as:  LANTUS  Inject 12 Units into the skin 2 (two) times daily. Inject 12 units in am and 8 units in pm     loperamide 2 MG capsule  Commonly known as:  IMODIUM  Take by mouth as needed for diarrhea or loose stools.     metroNIDAZOLE 500 MG tablet  Commonly known as:  FLAGYL      NOVOLOG 100 UNIT/ML injection  Generic drug:  insulin aspart     nystatin 100000 UNIT/ML suspension  Commonly known as:  MYCOSTATIN     pantoprazole 40 MG tablet  Commonly known as:  PROTONIX  Take 1 tablet (40 mg total) by mouth 2 (two) times daily.     repaglinide 1 MG tablet  Commonly known as:  PRANDIN  Take 1 tablet (1 mg total) by mouth daily before supper.     sodium chloride 0.65 % Soln nasal spray  Commonly known as:  OCEAN  Place 2 sprays into both nostrils as needed for congestion.     sorbitol 70 % solution  Take 15 mLs by mouth daily as needed.     spironolactone 25 MG tablet  Commonly known as:  ALDACTONE  Take 25 mg by mouth 2 (two) times daily.     XALATAN 0.005 % ophthalmic solution  Generic drug:  latanoprost  1 drop at bedtime.        Allergies:  Allergies  Allergen Reactions  . Penicillins Other (See Comments)    REACTION: causes hallucinations  . Sulfamethoxazole-Trimethoprim Swelling    REACTION: rash  . Bactrim     Unknown per mar  . Trimethoprim     Unknown per mar     Past Medical History  Diagnosis Date  . GERD (gastroesophageal reflux disease)   . Chronic abdominal pain   . DJD (degenerative joint disease)   . Hypertensive cardiovascular disease   . Obesity   . Gastritis, chronic   . 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 Esophageal stricture   . Hiatal hernia   . Hx of adenomatous colonic polyps   . Diverticulosis   . Dyslipidemia   . Arthritis   . DM (diabetes mellitus)   . Hypertension   . Microcytic anemia   . Edema   . Renal disorder   . Bacterial meningitis     Past Surgical History  Procedure Laterality Date  . Appendectomy    . Cholecystectomy  1999  . Vesicovaginal fistula closure w/ tah  1957  . Bilateral oophorectomy  1957  . Hernia repair  09/7515    umbilical and ventral hernia repair by Dr. Margot Chimes  . Rotator cuff repair  7/05  right  . Back surgery       Family History  Problem Relation Age of Onset  . Stroke Mother   . Cancer Father   . Hypertension Other     Social History:  reports that she has never smoked. She has never used smokeless tobacco. She reports that she does not drink alcohol or use illicit drugs.    Review of Systems    Lipids: She is currently not on any medications   Lab Results  Component Value Date   CHOL 146 11/15/2013   HDL 55.60 11/15/2013   LDLCALC 72 11/15/2013   TRIG 90.0 11/15/2013   CHOLHDL 3 11/15/2013  \   She is still taking diuretics including spironolactone. Also has had mild hyponatremia   Physical Examination:  BP 120/42 mmHg  Pulse 61  Temp(Src) 98.3 F (36.8 C)  Resp 16  Ht 5\' 1"  (1.549 m)  Wt 186 lb 9.6 oz (84.641 kg)  BMI 35.28 kg/m2  SpO2 96%     ASSESSMENT:  Diabetes type 2   Her glucose readings seem to be fairly good with using NovoLog in addition to her twice a day Lantus regimen She may be requiring more insulin because of her continued weight gain over the last few months. Most likely is not benefiting from Bouse which she had been taking. However she is probably getting lower readings after lunch from decreased carbohydrate intake at that time Has had one episode of hypoglycemia  PLAN:   Continue Lantus unchanged Stop NovoLog at lunch She is a good candidate for adding Victoza but her son is reluctant to change her regimen as yet Stop Prandin Her son will ask the nursing home to get a dietitian to see her for her weight maintenance Given written instructions for nursing home  Followup in 6 weeks to reassess her control and consider Victoza  Aiva Miskell 10/03/2014, 11:17 AM

## 2014-10-31 ENCOUNTER — Encounter: Payer: Self-pay | Admitting: Diagnostic Neuroimaging

## 2014-10-31 ENCOUNTER — Ambulatory Visit (INDEPENDENT_AMBULATORY_CARE_PROVIDER_SITE_OTHER): Payer: Medicare PPO | Admitting: Diagnostic Neuroimaging

## 2014-10-31 VITALS — Ht 60.0 in | Wt 188.5 lb

## 2014-10-31 DIAGNOSIS — F0390 Unspecified dementia without behavioral disturbance: Secondary | ICD-10-CM

## 2014-10-31 DIAGNOSIS — F03B0 Unspecified dementia, moderate, without behavioral disturbance, psychotic disturbance, mood disturbance, and anxiety: Secondary | ICD-10-CM | POA: Insufficient documentation

## 2014-10-31 NOTE — Progress Notes (Signed)
[facility]  PATIENT: Allison Rodgers DOB: [birth year]  REFERRING CLINICIAN:  HISTORY FROM: patient and son REASON FOR VISIT: follow up   HISTORICAL  CHIEF COMPLAINT:  Chief Complaint  Patient presents with  . Follow-up    mild cognitive changes with memory loss     HISTORY OF PRESENT ILLNESS:   UPDATE 10/31/14: Since last visit, memory issues (short term) have progressed. No hallucinations or behavior disturbances. Some mild generalized aches and pains. Good mood. Enjoying life.   UPDATE 10/28/13: Since last visit patient is doing better. She is in better spirits and mood. She feels like her memory symptoms are better. Apparently her PCP made some medication adjustments which have contributed to this stabilization/improvement. Doing well at [facility] assisted living.  UPDATE 04/15/13: Since last visit, patient had MRI of the brain in April which showed some atrophy but no acute findings. Patient continued to have problems with stomach pain, constipation, diabetes control. In August 2014, patient was found confused at home, and admitted to the hospital with fever, encephalopathy, elevated blood sugar/dka. Patient was stabilized medically and treated empirically for meningitis (although LP was not able to successfully be performed due to scar tissue). Now patient has transition to skilled nursing facility. Patient continues to have problems with poor appetite, hoarse voice, trouble swallowing, nausea, constipation, bilateral shoulder pain, poor energy, decreased motivation, anxiety and depression.  PRIOR HPI (11/12/12): 81 year old right-handed female with hypertension, diabetes, arthritis, anxiety, here for evaluation of memory problems. Patient accompanied by son today. Patient reports 2-3 month history of short-term memory loss, forgetting recent conversations, tasks, events. Sometimes she forgets to pay her bills. Sometimes she forgets to lock doors. Patient continues  to live alone and takes care of her activities of daily living. She has been more withdrawn and less active socially. Patient having more difficulty with processing written language and documents. According to the son her memory problems have actually been going on for at least one year. He was out of town but checks in on patient fairly frequently. Patient also having intermittent episodes of auditory and visual hallucinations. Sometimes she sees someone in the driveway, then looked back again and does not see anyone there. Sometimes she hears someone knocking on her door but when she checks no one is there. This been going on for the past few months. Patient was treated with Aricept for a few months, and then discontinued. Patient was restarted on this again a second time but discontinued. Patient did not notice any benefit or side effects of the medication.   REVIEW OF SYSTEMS: Full 14 system review of systems performed and notable only for fatigue trouble swallowing back pain aching muscles abdominal pain cold intolerance.   ALLERGIES: Allergies  Allergen Reactions  . Penicillins Other (See Comments)    REACTION: causes hallucinations  . Sulfamethoxazole-Trimethoprim Swelling    REACTION: rash  . Bactrim     Unknown per mar  . Trimethoprim     Unknown per mar     HOME MEDICATIONS: Outpatient Prescriptions Prior to Visit  Medication Sig Dispense Refill  . acetaminophen (TYLENOL) 500 MG tablet 500-1,000 mg every 6 (six) hours as needed for pain. Per bottle as needed for pain    . aspirin 81 MG chewable tablet Chew 81 mg by mouth daily.      . dorzolamide (TRUSOPT) 2 % ophthalmic solution Place 1 drop into both eyes at bedtime.    . feeding supplement, GLUCERNA SHAKE, ([place]) LIQD  Take 237 mLs by mouth 3 (three) times daily between meals.    . fluocinonide cream (LIDEX) 3.79 % Apply 1 application topically 2 (two) times daily as needed.     . furosemide (LASIX) 20 MG tablet Take  1 tablet (20 mg total) by mouth daily. 30 tablet 1  . gabapentin (NEURONTIN) 100 MG capsule     . insulin glargine (LANTUS) 100 UNIT/ML injection Inject 12 Units into the skin 2 (two) times daily. Inject 12 units in am and 8 units in pm    . latanoprost (XALATAN) 0.005 % ophthalmic solution 1 drop at bedtime.    Marland Kitchen loperamide (IMODIUM) 2 MG capsule Take by mouth as needed for diarrhea or loose stools.    Marland Kitchen NOVOLOG 100 UNIT/ML injection     . nystatin (MYCOSTATIN) 100000 UNIT/ML suspension     . pantoprazole (PROTONIX) 40 MG tablet Take 1 tablet (40 mg total) by mouth 2 (two) times daily. 60 tablet 0  . Probiotic Product (ALIGN) 4 MG CAPS Take 1 tablet by mouth daily.    . sodium chloride (OCEAN) 0.65 % SOLN nasal spray Place 2 sprays into both nostrils as needed for congestion.    . sorbitol 70 % solution Take 15 mLs by mouth daily as needed.    Marland Kitchen spironolactone (ALDACTONE) 25 MG tablet Take 25 mg by mouth 2 (two) times daily.    Marland Kitchen guaiFENesin-dextromethorphan (ROBITUSSIN DM) 100-10 MG/5ML syrup Take 5 mLs by mouth 3 (three) times daily as needed for cough.    . ALPRAZolam (XANAX) 0.25 MG tablet Take 1 tablet (0.25 mg total) by mouth every 6 (six) hours as needed for anxiety. 30 tablet 0  . fluconazole (DIFLUCAN) 200 MG tablet     . metroNIDAZOLE (FLAGYL) 500 MG tablet     . repaglinide (PRANDIN) 1 MG tablet Take 1 tablet (1 mg total) by mouth daily before supper. 30 tablet 2   No facility-administered medications prior to visit.    PAST MEDICAL HISTORY: Past Medical History  Diagnosis Date  . GERD (gastroesophageal reflux disease)   . Chronic abdominal pain   . DJD (degenerative joint disease)   . Hypertensive cardiovascular disease   . Obesity   . Gastritis, chronic   . 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 Esophageal stricture   . Hiatal hernia   . Hx of adenomatous colonic polyps   . Diverticulosis   . Dyslipidemia   . Arthritis   . DM  (diabetes mellitus)   . Hypertension   . Microcytic anemia   . Edema   . Renal disorder   . Bacterial meningitis     PAST SURGICAL HISTORY: Past Surgical History  Procedure Laterality Date  . Appendectomy    . Cholecystectomy  1999  . Vesicovaginal fistula closure w/ tah  1957  . Bilateral oophorectomy  1957  . Hernia repair  0/2409    umbilical and ventral hernia repair by Dr. Margot Chimes  . Rotator cuff repair  7/05    right  . Back surgery      FAMILY HISTORY: Family History  Problem Relation Age of Onset  . Stroke Mother   . Cancer Father   . Hypertension Other     SOCIAL HISTORY:  History   Social History  . Marital Status: Widowed    Spouse Name: N/A  . Number of Children: 1  . Years of Education: College   Occupational History  . Retired  Social History Main Topics  . Smoking status: Never Smoker   . Smokeless tobacco: Never Used  . Alcohol Use: No  . Drug Use: No  . Sexual Activity: Not on file   Other Topics Concern  . 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   She drinks 1 cup of caffeine per month and rarely drinks sodas.     PHYSICAL EXAM  Filed Vitals:   10/31/14 1141  Height: 5' (1.524 m)  Weight: 188 lb 8 oz (85.503 kg)   Body mass index is 36.81 kg/(m^2).   MMSE - Mini Mental State Exam 10/31/2014  Orientation to time 2  Orientation to Place 4  Registration 3  Attention/ Calculation 1  Recall 0  Language- name 2 objects 2  Language- repeat 0  Language- follow 3 step command 1  Language- read & follow direction 0  Write a sentence 1  Copy design 0  Total score 14     GENERAL EXAM: Patient is in no distress  CARDIOVASCULAR: Regular rate and rhythm, no murmurs, no carotid bruits  NEUROLOGIC: MENTAL STATUS: awake, alert, language fluent, comprehension intact, naming intact; POSITIVE SNOUT, MYERSONS.    CRANIAL NERVE: pupils equal and reactive to light, visual fields full to confrontation, extraocular muscles intact, no nystagmus, facial sensation and strength symmetric, uvula midline, shoulder shrug symmetric, tongue midline.  MOTOR: normal bulk and tone, DIFFUSE 4/5. LIMITED IN BUE DUE TO SHOULDER PAIN. RARE REST TREMOR WITH CONTRALATERAL RAM; MOD BRADYKINESIA IN BUE AND BLE, RIGHT SLOWER THAN LEFT SENSORY: DECR VIB AT TOES (< 5 SECS) COORDINATION: finger-nose-finger, fine finger movements SLOW THROUGHOUT. REFLEXES: BUE 1, BLE TRACE GAIT/STATION: ABLE TO TAKE STEPS WITHOUT WALKER, SLOW AND CAREFUL; USES WALKER FOR LONGER DISTANCES. MILDLY ANTALGIC GAIT   DIAGNOSTIC DATA (LABS, IMAGING, TESTING) - I reviewed patient records, labs, notes, testing and imaging myself where available.  Lab Results  Component Value Date   WBC 7.8 11/22/2013   HGB 11.7* 11/22/2013   HCT 37.2 11/22/2013   MCV 77.0* 11/22/2013   PLT 235.0 11/22/2013      Component Value Date/Time   NA 138 07/01/2014 1149   K 4.4 07/01/2014 1149   CL 107 07/01/2014 1149   CO2 25 07/01/2014 1149   GLUCOSE 108* 09/28/2014 1104   GLUCOSE 97 07/16/2006 1041   BUN 53* 07/01/2014 1149   CREATININE 2.5* 07/01/2014 1149   CALCIUM 9.1 07/01/2014 1149   PROT 7.5 07/01/2014 1149   ALBUMIN 3.6 07/01/2014 1149   AST 17 07/01/2014 1149   ALT 14 07/01/2014 1149   ALKPHOS 107 07/01/2014 1149   BILITOT 0.4 07/01/2014 1149   GFRNONAA 25* 06/01/2013 0612   GFRAA 29* 06/01/2013 0612   Lab Results  Component Value Date   CHOL 146 11/15/2013   HDL 55.60 11/15/2013   LDLCALC 72 11/15/2013   TRIG 90.0 11/15/2013   CHOLHDL 3 11/15/2013   Lab Results  Component Value Date   HGBA1C 7.1* 09/28/2014   Lab Results  Component Value Date   MVEHMCNO70 962 03/18/2012   Lab Results  Component Value Date   TSH 1.11 11/22/2013    04/01/13 MRI brain - Motion degraded exam demonstrating no acute stroke or hemorrhage. Mild dural  thickening around the foramen magnum of uncertain significance. In the setting of fever and encephalopathy, meningitis cannot completely be  excluded. If there are no contraindications, lumbar puncture may be helpful in further evaluation.   04/01/13 MRA head - Suspected mid to distal basilar stenosis on this motion degraded exam. Gross patency of internal carotid arteries is established.  I reviewed images myself and agree with interpretation. Also there is mild-moderate atrophy and ventriculomegaly on ex vacuo basis. -VRP   ASSESSMENT AND PLAN  81 y.o. year old female  has a past medical history of GERD (gastroesophageal reflux disease); Chronic abdominal pain; DJD (degenerative joint disease); Hypertensive cardiovascular disease; Obesity; Gastritis, chronic; Benign neoplasm of other and unspecified site of the digestive system; Esophageal stricture; Hiatal hernia; adenomatous colonic polyps; Diverticulosis; Dyslipidemia; Arthritis; DM (diabetes mellitus); Hypertension; Microcytic anemia; Edema; Renal disorder; and Bacterial meningitis. here with progressive short term memory loss (2013-present).  Has intermittent anxiety, auditory and visual hallucinations. MMSE was 26/30 in March 2014, 25/30 in March 2015 and 14/30 in March 2016. Has frontal release signs (snout, myersons).  Dx: neurodegenerative (dementia with lewy bodies vs alzheimers)   PLAN: 1. Advanced age, polypharmacy, multiple conditions, so overall we decided to not institute donepezil/memantine therapy; would advise palliative/ conservative approach.  Return if symptoms worsen or fail to improve, for return to PCP.   I spent 15 minutes of face to face time with patient. Greater than 50% of time was spent in counseling and coordination of care with patient. In summary we discussed dementia diagnosis and treatment options, including code status (patient is DNR) and end of life planning.   Penni Bombard, MD 1/68/3729,  02:11DB Certified in Neurology, Neurophysiology and Neuroimaging  [facility] [address] [place], [place] [address] [phone]

## 2014-11-14 ENCOUNTER — Encounter: Payer: Self-pay | Admitting: Endocrinology

## 2014-11-14 ENCOUNTER — Ambulatory Visit (INDEPENDENT_AMBULATORY_CARE_PROVIDER_SITE_OTHER): Payer: Medicare PPO | Admitting: Endocrinology

## 2014-11-14 VITALS — BP 118/78 | HR 55 | Temp 97.9°F | Resp 16 | Ht 60.0 in | Wt 188.8 lb

## 2014-11-14 DIAGNOSIS — E1165 Type 2 diabetes mellitus with hyperglycemia: Secondary | ICD-10-CM

## 2014-11-14 DIAGNOSIS — IMO0002 Reserved for concepts with insufficient information to code with codable children: Secondary | ICD-10-CM

## 2014-11-14 NOTE — Progress Notes (Signed)
Patient ID: XARENI KELCH, female   DOB: February 06, 1924, 81 y.o.   MRN: [id number]   Reason for Appointment : Followup for Type 2 Diabetes  History of Present llness          Diagnosis: Type 2 diabetes mellitus, date of diagnosis: ? 2007       Past history:  Apparently she had blood sugars over 500 at the time of diagnosis She was started on insulin at the time of diagnosis and has been continued on insulin with various regimens Also her weight had been previously significantly more, about 200 pounds  Recent history:  Her blood sugars starting increasing consistently in late December/early January especially after meals.  Her highest blood sugar was 356 With this she was started on NovoLog 5 units before meals at the end of January She continues to take LANTUS twice a day also On her last visit her blood sugars were sometimes low at suppertime and her NovoLog was stopped at lunchtime Blood sugars reviewed by fax subsequently looked fairly good  More recently her blood sugars have been somewhat variable but in the last few days starting to be higher at lunch and supper most of the time but only occasionally over 200  Fasting readings are reasonably good and most of her evening readings after supper are reasonable She did have a reading of 345 yesterday after a heavy late lunch with dessert and did not take any evening insulin because of skipping her evening meal  Her diet is usually variable, depending on what she is getting at the nursing home However her weight has been about the same recently  Hemoglobin A1c: This tends to be lower than expected probably because of her renal insufficiency but was relatively higher at 7.1 in 2/16  INSULIN regimen: Lantus 12 units a.m. and 8 units p.m..  NovoLog 4--3 units before meals  Glucose monitoring:  done 2 times  a day.   Glucometer: ?     Blood Glucose readings from nursing home record over the last 2 weeks or so:  PRE-MEAL  Breakfast Lunch Dinner Bedtime Overall  Glucose range:  118-169   132-212   121-262   105-191    Mean/median:        Hypoglycemia : None       Meals: 3 meals per day.  breakfast:  Egg, toast, juice and sausage  Physical activity: exercise: Trying to walk in her nursing home, her son say that she is not doing this often Dietician consultations: Several years ago             Retinal exam: Most recent:.2014    Wt Readings from Last 3 Encounters:  11/14/14 188 lb 12.8 oz (85.639 kg)  10/31/14 188 lb 8 oz (85.503 kg)  10/03/14 186 lb 9.6 oz (84.641 kg)   LABS:  Lab Results  Component Value Date   HGBA1C 7.1* 09/28/2014   HGBA1C 6.5 07/01/2014   HGBA1C 6.3 03/28/2014   Lab Results  Component Value Date   MICROALBUR 0.2 11/15/2013   LDLCALC 72 11/15/2013   CREATININE 2.5* 07/01/2014       Medication List       This list is accurate as of: 11/14/14 11:33 AM.  Always use your most recent med list.               acetaminophen 500 MG tablet  Commonly known as:  TYLENOL  500-1,000 mg every 6 (six) hours as needed for pain.  Per bottle as needed for pain     ALIGN 4 MG Caps  Take 1 tablet by mouth daily.     aspirin 81 MG chewable tablet  Chew 81 mg by mouth daily.     Cranberry 200 MG Caps  Take 2 capsules by mouth 2 (two) times daily.     dorzolamide 2 % ophthalmic solution  Commonly known as:  TRUSOPT  Place 1 drop into both eyes at bedtime.     feeding supplement (GLUCERNA SHAKE) Liqd  Take 237 mLs by mouth 3 (three) times daily between meals.     fluocinonide cream 0.05 %  Commonly known as:  LIDEX  Apply 1 application topically 2 (two) times daily as needed.     furosemide 20 MG tablet  Commonly known as:  LASIX  Take 1 tablet (20 mg total) by mouth daily.     gabapentin 100 MG capsule  Commonly known as:  NEURONTIN     guaiFENesin-dextromethorphan 100-10 MG/5ML syrup  Commonly known as:  ROBITUSSIN DM  Take 5 mLs by mouth 3 (three) times daily as  needed for cough.     insulin glargine 100 UNIT/ML injection  Commonly known as:  LANTUS  Inject 12 Units into the skin 2 (two) times daily. Inject 12 units in am and 8 units in pm     loperamide 2 MG capsule  Commonly known as:  IMODIUM  Take by mouth as needed for diarrhea or loose stools.     NOVOLOG 100 UNIT/ML injection  Generic drug:  insulin aspart     nystatin 100000 UNIT/ML suspension  Commonly known as:  MYCOSTATIN     pantoprazole 40 MG tablet  Commonly known as:  PROTONIX  Take 1 tablet (40 mg total) by mouth 2 (two) times daily.     repaglinide 1 MG tablet  Commonly known as:  PRANDIN     sodium chloride 0.65 % Soln nasal spray  Commonly known as:  OCEAN  Place 2 sprays into both nostrils as needed for congestion.     sorbitol 70 % solution  Take 15 mLs by mouth daily as needed.     spironolactone 25 MG tablet  Commonly known as:  ALDACTONE  Take 25 mg by mouth 2 (two) times daily.     XALATAN 0.005 % ophthalmic solution  Generic drug:  latanoprost  1 drop at bedtime.        Allergies:  Allergies  Allergen Reactions  . Penicillins Other (See Comments)    REACTION: causes hallucinations  . Sulfamethoxazole-Trimethoprim Swelling    REACTION: rash  . Bactrim     Unknown per mar  . Trimethoprim     Unknown per mar     Past Medical History  Diagnosis Date  . GERD (gastroesophageal reflux disease)   . Chronic abdominal pain   . DJD (degenerative joint disease)   . Hypertensive cardiovascular disease   . Obesity   . Gastritis, chronic   . 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 Esophageal stricture   . Hiatal hernia   . Hx of adenomatous colonic polyps   . Diverticulosis   . Dyslipidemia   . Arthritis   . DM (diabetes mellitus)   . Hypertension   . Microcytic anemia   . Edema   . Renal disorder   . Bacterial meningitis     Past Surgical History  Procedure Laterality Date  . Appendectomy    .  Cholecystectomy  1999  . Vesicovaginal fistula closure w/ tah  1957  . Bilateral oophorectomy  1957  . Hernia repair  0/7121    umbilical and ventral hernia repair by Dr. Margot Chimes  . Rotator cuff repair  7/05    right  . Back surgery      Family History  Problem Relation Age of Onset  . Stroke Mother   . Cancer Father   . Hypertension Other     Social History:  reports that she has never smoked. She has never used smokeless tobacco. She reports that she does not drink alcohol or use illicit drugs.    Review of Systems    Lipids: She is currently not on any medications   Lab Results  Component Value Date   CHOL 146 11/15/2013   HDL 55.60 11/15/2013   LDLCALC 72 11/15/2013   TRIG 90.0 11/15/2013   CHOLHDL 3 11/15/2013  \   She is still taking diuretics including spironolactone. Also has had mild hyponatremia   Physical Examination:  BP 118/78 mmHg  Pulse 55  Temp(Src) 97.9 F (36.6 C)  Resp 16  Ht 5' (1.524 m)  Wt 188 lb 12.8 oz (85.639 kg)  BMI 36.87 kg/m2  SpO2 93%     ASSESSMENT:  Diabetes type 2   Her glucose readings seem to be fairly good with using low doses of NovoLog in addition to her twice a day Lantus regimen More recently her blood sugars are higher during the day at lunch and supper although somewhat inconsistent She continues to be able to take her basal bolus insulin regimen as she is in a nursing home No hypoglycemia with stopping lunchtime NovoLog  PLAN:   Increase morning Lantus by 2 units to cover daytime hyperglycemia Continue same doses of NovoLog for now Review blood sugars by fax in about 2 weeks   Valjean Ruppel 11/14/2014, 11:33 AM

## 2014-11-14 NOTE — Patient Instructions (Signed)
Increase Lantus to 14 units in am

## 2014-11-16 ENCOUNTER — Ambulatory Visit (INDEPENDENT_AMBULATORY_CARE_PROVIDER_SITE_OTHER): Payer: Medicare PPO | Admitting: Podiatry

## 2014-11-16 DIAGNOSIS — M79676 Pain in unspecified toe(s): Secondary | ICD-10-CM

## 2014-11-16 DIAGNOSIS — B351 Tinea unguium: Secondary | ICD-10-CM

## 2014-11-16 NOTE — Patient Instructions (Signed)
Diabetes and Foot Care Diabetes may cause you to have problems because of poor blood supply (circulation) to your feet and legs. This may cause the skin on your feet to become thinner, break easier, and heal more slowly. Your skin may become dry, and the skin may peel and crack. You may also have nerve damage in your legs and feet causing decreased feeling in them. You may not notice minor injuries to your feet that could lead to infections or more serious problems. Taking care of your feet is one of the most important things you can do for yourself.  HOME CARE INSTRUCTIONS  Wear shoes at all times, even in the house. Do not go barefoot. Bare feet are easily injured.  Check your feet daily for blisters, cuts, and redness. If you cannot see the bottom of your feet, use a mirror or ask someone for help.  Wash your feet with warm water (do not use hot water) and mild soap. Then pat your feet and the areas between your toes until they are completely dry. Do not soak your feet as this can dry your skin.  Apply a moisturizing lotion or petroleum jelly (that does not contain alcohol and is unscented) to the skin on your feet and to dry, brittle toenails. Do not apply lotion between your toes.  Trim your toenails straight across. Do not dig under them or around the cuticle. File the edges of your nails with an emery board or nail file.  Do not cut corns or calluses or try to remove them with medicine.  Wear clean socks or stockings every day. Make sure they are not too tight. Do not wear knee-high stockings since they may decrease blood flow to your legs.  Wear shoes that fit properly and have enough cushioning. To break in new shoes, wear them for just a few hours a day. This prevents you from injuring your feet. Always look in your shoes before you put them on to be sure there are no objects inside.  Do not cross your legs. This may decrease the blood flow to your feet.  If you find a minor scrape,  cut, or break in the skin on your feet, keep it and the skin around it clean and dry. These areas may be cleansed with mild soap and water. Do not cleanse the area with peroxide, alcohol, or iodine.  When you remove an adhesive bandage, be sure not to damage the skin around it.  If you have a wound, look at it several times a day to make sure it is healing.  Do not use heating pads or hot water bottles. They may burn your skin. If you have lost feeling in your feet or legs, you may not know it is happening until it is too late.  Make sure your health care provider performs a complete foot exam at least annually or more often if you have foot problems. Report any cuts, sores, or bruises to your health care provider immediately. SEEK MEDICAL CARE IF:   You have an injury that is not healing.  You have cuts or breaks in the skin.  You have an ingrown nail.  You notice redness on your legs or feet.  You feel burning or tingling in your legs or feet.  You have pain or cramps in your legs and feet.  Your legs or feet are numb.  Your feet always feel cold. SEEK IMMEDIATE MEDICAL CARE IF:   There is increasing redness,   swelling, or pain in or around a wound.  There is a red line that goes up your leg.  Pus is coming from a wound.  You develop a fever or as directed by your health care provider.  You notice a bad smell coming from an ulcer or wound. Document Released: 07/26/2000 Document Revised: 03/31/2013 Document Reviewed: 01/05/2013 Exit[employer] Patient Information 2015 [employer]. This information is not intended to replace advice given to you by your health care provider. Make sure you discuss any questions you have with your health care provider.  

## 2014-11-17 NOTE — Progress Notes (Signed)
Patient ID: Allison Rodgers, female   DOB: 07-29-1924, 81 y.o.   MRN: [id number]  Subjective: Patient presents again complaining of painful toenails and requests toenail debridement  Objective: Orientated 3 The toenails are elongated, hypertrophic, discolored and tender to palpation 6-10  Assessment: Symptomatic onychomycoses 6-10 Diabetic with a history of neurological manifestations  Plan: Debridement toenails 10 without any bleeding  Reappoint 3 months

## 2015-02-11 ENCOUNTER — Emergency Department (HOSPITAL_COMMUNITY): Payer: No Typology Code available for payment source

## 2015-02-11 ENCOUNTER — Emergency Department (HOSPITAL_COMMUNITY)
Admission: EM | Admit: 2015-02-11 | Discharge: 2015-02-11 | Disposition: A | Discharge status: Home / Self Care | Payer: No Typology Code available for payment source | Attending: Emergency Medicine | Admitting: Emergency Medicine

## 2015-02-11 ENCOUNTER — Encounter (HOSPITAL_COMMUNITY): Payer: Self-pay | Admitting: *Deleted

## 2015-02-11 DIAGNOSIS — Z880 Allergy status to penicillin: Secondary | ICD-10-CM | POA: Insufficient documentation

## 2015-02-11 DIAGNOSIS — Y92410 Unspecified street and highway as the place of occurrence of the external cause: Secondary | ICD-10-CM | POA: Insufficient documentation

## 2015-02-11 DIAGNOSIS — Z79899 Other long term (current) drug therapy: Secondary | ICD-10-CM | POA: Diagnosis not present

## 2015-02-11 DIAGNOSIS — Z7982 Long term (current) use of aspirin: Secondary | ICD-10-CM | POA: Insufficient documentation

## 2015-02-11 DIAGNOSIS — I119 Hypertensive heart disease without heart failure: Secondary | ICD-10-CM | POA: Insufficient documentation

## 2015-02-11 DIAGNOSIS — S4991XA Unspecified injury of right shoulder and upper arm, initial encounter: Secondary | ICD-10-CM | POA: Insufficient documentation

## 2015-02-11 DIAGNOSIS — G8929 Other chronic pain: Secondary | ICD-10-CM | POA: Insufficient documentation

## 2015-02-11 DIAGNOSIS — K219 Gastro-esophageal reflux disease without esophagitis: Secondary | ICD-10-CM | POA: Diagnosis not present

## 2015-02-11 DIAGNOSIS — M1990 Unspecified osteoarthritis, unspecified site: Secondary | ICD-10-CM | POA: Insufficient documentation

## 2015-02-11 DIAGNOSIS — Y998 Other external cause status: Secondary | ICD-10-CM | POA: Diagnosis not present

## 2015-02-11 DIAGNOSIS — Z794 Long term (current) use of insulin: Secondary | ICD-10-CM | POA: Insufficient documentation

## 2015-02-11 DIAGNOSIS — Z87448 Personal history of other diseases of urinary system: Secondary | ICD-10-CM | POA: Insufficient documentation

## 2015-02-11 DIAGNOSIS — S29001A Unspecified injury of muscle and tendon of front wall of thorax, initial encounter: Secondary | ICD-10-CM | POA: Diagnosis not present

## 2015-02-11 DIAGNOSIS — M25511 Pain in right shoulder: Secondary | ICD-10-CM

## 2015-02-11 DIAGNOSIS — E669 Obesity, unspecified: Secondary | ICD-10-CM | POA: Insufficient documentation

## 2015-02-11 DIAGNOSIS — E119 Type 2 diabetes mellitus without complications: Secondary | ICD-10-CM | POA: Diagnosis not present

## 2015-02-11 DIAGNOSIS — Z86018 Personal history of other benign neoplasm: Secondary | ICD-10-CM | POA: Diagnosis not present

## 2015-02-11 DIAGNOSIS — Z862 Personal history of diseases of the blood and blood-forming organs and certain disorders involving the immune mechanism: Secondary | ICD-10-CM | POA: Insufficient documentation

## 2015-02-11 DIAGNOSIS — Y9389 Activity, other specified: Secondary | ICD-10-CM | POA: Diagnosis not present

## 2015-02-11 MED ORDER — ACETAMINOPHEN 500 MG PO TABS: 500.0000 mg | ORAL_TABLET | Freq: Four times a day (QID) | ORAL | Status: DC | PRN

## 2015-02-11 MED ORDER — ACETAMINOPHEN 325 MG PO TABS
325.0000 mg | ORAL_TABLET | Freq: Once | ORAL | Status: AC
  Administered 2015-02-11: 325 mg via ORAL
  Filled 2015-02-11: qty 1

## 2015-02-11 NOTE — ED Notes (Signed)
  Pt able to ambulate to wheelchair from bed.  Gait steady and even.

## 2015-02-11 NOTE — ED Notes (Signed)
To x-ray
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
## 2015-02-14 ENCOUNTER — Encounter: Payer: Self-pay | Admitting: Endocrinology

## 2015-02-14 ENCOUNTER — Ambulatory Visit (INDEPENDENT_AMBULATORY_CARE_PROVIDER_SITE_OTHER): Payer: Medicare PPO | Admitting: Endocrinology

## 2015-02-14 VITALS — BP 132/82 | HR 79 | Temp 98.3°F | Resp 16 | Ht 64.0 in | Wt 188.6 lb

## 2015-02-14 DIAGNOSIS — E1165 Type 2 diabetes mellitus with hyperglycemia: Secondary | ICD-10-CM

## 2015-02-14 DIAGNOSIS — IMO0002 Reserved for concepts with insufficient information to code with codable children: Secondary | ICD-10-CM

## 2015-02-14 DIAGNOSIS — N184 Chronic kidney disease, stage 4 (severe): Secondary | ICD-10-CM | POA: Diagnosis not present

## 2015-02-14 LAB — BASIC METABOLIC PANEL
BUN: 27 mg/dL — AB (ref 6–23)
CO2: 24 mEq/L (ref 19–32)
Calcium: 9.3 mg/dL (ref 8.4–10.5)
Chloride: 110 mEq/L (ref 96–112)
Creatinine, Ser: 1.7 mg/dL — ABNORMAL HIGH (ref 0.40–1.20)
GFR: 36.36 mL/min — AB (ref >60.00)
Glucose, Bld: 137 mg/dL — ABNORMAL HIGH (ref 70–99)
Potassium: 3.9 mEq/L (ref 3.5–5.1)
Sodium: 142 mEq/L (ref 135–145)

## 2015-02-14 LAB — HEMOGLOBIN A1C: HEMOGLOBIN A1C: 5.9 % (ref 4.6–6.5)

## 2015-02-14 NOTE — Progress Notes (Signed)
Patient ID: Allison Rodgers, female   DOB: 1924/01/21, 81 y.o.   MRN: [id number]   Reason for Appointment : Followup for Type 2 Diabetes  History of Present llness          Diagnosis: Type 2 diabetes mellitus, date of diagnosis: ? 2007       Past history:  Apparently she had blood sugars over 500 at the time of diagnosis She was started on insulin at the time of diagnosis and has been continued on insulin with various regimens Also her weight had been previously significantly more, about 200 pounds Her blood sugars  were higher in late December/early January especially after meals, with highest 356.  With this she was started on NovoLog 5 units before meals at the end of January  Recent history:   INSULIN regimen: Lantus 14 units a.m. and 8 units p.m..  NovoLog 4 before breakfast--3 units before dinner  She continues to take LANTUS twice a day along with small doses of Novolog at breakfast and supper On her last visit her blood sugars were relatively higher in the afternoon and her Lantus was increased by 2 units  More recently her blood sugars have been excellent with nearly normal readings without hypoglycemia Her lowest readings are probably at lunchtime and she does not appear to have any hypoglycemia She thinks she is eating fairly well at all meals  Her diet is usually variable, depending on what she is getting at the nursing home However her weight has been about the same recently  Hemoglobin A1c: This tends to be lower than expected probably because of her renal insufficiency but was relatively higher at 7.1 in 2/16  Glucose monitoring:  done 2 times  a day.   Glucometer: ?     Blood Glucose readings from nursing home record over the last 2 weeks:  Mean values apply above for all meters except median for One Touch  PRE-MEAL Fasting Lunch Dinner Bedtime Overall  Glucose range:  97-132   76-170   88-177   84-149    Mean/median:         Hypoglycemia : None    Meals: 3 meals per day.  breakfast:  Egg, toast, juice and sausage  Physical activity: exercise: Trying to walk in her nursing home, her son say that she is not doing this often Dietician consultations: Several years ago             Retinal exam: Most recent:.2014    Wt Readings from Last 3 Encounters:  02/14/15 188 lb 9.6 oz (85.548 kg)  02/11/15 182 lb (82.555 kg)  11/14/14 188 lb 12.8 oz (85.639 kg)   LABS:  Lab Results  Component Value Date   HGBA1C 7.1* 09/28/2014   HGBA1C 6.5 07/01/2014   HGBA1C 6.3 03/28/2014   Lab Results  Component Value Date   MICROALBUR 0.2 11/15/2013   LDLCALC 72 11/15/2013   CREATININE 2.5* 07/01/2014       Medication List       This list is accurate as of: 02/14/15 10:51 AM.  Always use your most recent med list.               acetaminophen 500 MG tablet  Commonly known as:  TYLENOL  Take 1 tablet (500 mg total) by mouth every 6 (six) hours as needed for moderate pain.     ALIGN 4 MG Caps  Take 1 tablet by mouth daily.     aspirin 81  MG chewable tablet  Chew 81 mg by mouth daily.     Cranberry 200 MG Caps  Take 2 capsules by mouth 2 (two) times daily.     dorzolamide 2 % ophthalmic solution  Commonly known as:  TRUSOPT  Place 1 drop into both eyes at bedtime.     feeding supplement (GLUCERNA SHAKE) Liqd  Take 237 mLs by mouth 3 (three) times daily between meals.     fluocinonide cream 0.05 %  Commonly known as:  LIDEX  Apply 1 application topically 2 (two) times daily as needed.     furosemide 20 MG tablet  Commonly known as:  LASIX  Take 1 tablet (20 mg total) by mouth daily.     gabapentin 100 MG capsule  Commonly known as:  NEURONTIN     guaifenesin 100 MG/5ML syrup  Commonly known as:  ROBITUSSIN  Take 200 mg by mouth as needed for cough. Take 10 ML every 6 hours as needed for cough     guaiFENesin-dextromethorphan 100-10 MG/5ML syrup  Commonly known as:  ROBITUSSIN DM  Take 5 mLs by mouth 3 (three) times  daily as needed for cough.     insulin glargine 100 UNIT/ML injection  Commonly known as:  LANTUS  Inject 8-14 Units into the skin 2 (two) times daily. Inject 14 units in am and 8 units in pm     lisinopril 2.5 MG tablet  Commonly known as:  PRINIVIL,ZESTRIL  Take 1.25 mg by mouth daily.     loperamide 2 MG capsule  Commonly known as:  IMODIUM  Take by mouth as needed for diarrhea or loose stools.     NOVOLOG 100 UNIT/ML injection  Generic drug:  insulin aspart  Inject 3 units subcutaneously before dinner     nystatin 100000 UNIT/ML suspension  Commonly known as:  MYCOSTATIN     omeprazole 20 MG capsule  Commonly known as:  PRILOSEC  Take 20 mg by mouth daily.     pantoprazole 40 MG tablet  Commonly known as:  PROTONIX  Take 1 tablet (40 mg total) by mouth 2 (two) times daily.     ranitidine 150 MG tablet  Commonly known as:  ZANTAC  Take 150 mg by mouth 2 (two) times daily.     repaglinide 1 MG tablet  Commonly known as:  PRANDIN     sodium chloride 0.65 % Soln nasal spray  Commonly known as:  OCEAN  Place 2 sprays into both nostrils as needed for congestion.     sorbitol 70 % solution  Take 15 mLs by mouth daily as needed.     spironolactone 25 MG tablet  Commonly known as:  ALDACTONE  Take 25 mg by mouth 2 (two) times daily.     XALATAN 0.005 % ophthalmic solution  Generic drug:  latanoprost  1 drop at bedtime.        Allergies:  Allergies  Allergen Reactions  . Penicillins Other (See Comments)    REACTION: causes hallucinations  . Sulfamethoxazole-Trimethoprim Swelling    REACTION: rash  . Bactrim     Unknown per mar  . Trimethoprim     Unknown per mar     Past Medical History  Diagnosis Date  . GERD (gastroesophageal reflux disease)   . Chronic abdominal pain   . DJD (degenerative joint disease)   . Hypertensive cardiovascular disease   . Obesity   . Gastritis, chronic   . Benign neoplasm of other and unspecified site of  the digestive  system   . Esophageal stricture   . Hiatal hernia   . Hx of adenomatous colonic polyps   . Diverticulosis   . Dyslipidemia   . Arthritis   . DM (diabetes mellitus)   . Hypertension   . Microcytic anemia   . Edema   . Renal disorder   . Bacterial meningitis     Past Surgical History  Procedure Laterality Date  . Appendectomy    . Cholecystectomy  1999  . Vesicovaginal fistula closure w/ tah  1957  . Bilateral oophorectomy  1957  . Hernia repair  12/537    umbilical and ventral hernia repair by Dr. Margot Chimes  . Rotator cuff repair  7/05    right  . Back surgery      Family History  Problem Relation Age of Onset  . Stroke Mother   . Cancer Father   . Hypertension Other     Social History:  reports that she has never smoked. She has never used smokeless tobacco. She reports that she does not drink alcohol or use illicit drugs.    Review of Systems    Lipids: She is currently not on any medications   Lab Results  Component Value Date   CHOL 146 11/15/2013   HDL 55.60 11/15/2013   LDLCALC 72 11/15/2013   TRIG 90.0 11/15/2013   CHOLHDL 3 11/15/2013     She is still taking diuretics including spironolactone. Also has had mild hyponatremia   Physical Examination:  BP 132/82 mmHg  Pulse 79  Temp(Src) 98.3 F (36.8 C)  Resp 16  Ht 5\' 4"  (1.626 m)  Wt 188 lb 9.6 oz (85.548 kg)  BMI 32.36 kg/m2  SpO2 98%  Diabetic foot exam shows normal monofilament sensation in the toes and plantar surfaces, no skin lesions or ulcers on the feet and normal pedal pulses 1+ edema present    ASSESSMENT:  Diabetes type 2   Her glucose readings seem to be fairly good with using low doses of NovoLog twice a in addition to her twice a day Lantus regimen More recently her blood sugars are occasionally low normal at lunchtime otherwise they are fairly stable throughout the day at various times She continues to be able to take her basal bolus insulin regimen administered at the  nursing home  PLAN:   Will reduce her Novolog to 3 units in the morning also but continue other doses unchanged A1c to be checked today Encouraged her to be as active as possible   Lamoine Magallon 02/14/2015, 10:51 AM   Addendum: Labs stable  Office Visit on 02/14/2015  Component Date Value Ref Range Status  . Hgb A1c MFr Bld 02/14/2015 5.9  4.6 - 6.5 % Final   Glycemic Control Guidelines for People with Diabetes:Non Diabetic:  <6%Goal of Therapy: <7%Additional Action Suggested:  >8%   . Sodium 02/14/2015 142  135 - 145 mEq/L Final  . Potassium 02/14/2015 3.9  3.5 - 5.1 mEq/L Final  . Chloride 02/14/2015 110  96 - 112 mEq/L Final  . CO2 02/14/2015 24  19 - 32 mEq/L Final  . Glucose, Bld 02/14/2015 137* 70 - 99 mg/dL Final  . BUN 02/14/2015 27* 6 - 23 mg/dL Final  . Creatinine, Ser 02/14/2015 1.70* 0.40 - 1.20 mg/dL Final  . Calcium 02/14/2015 9.3  8.4 - 10.5 mg/dL Final  . GFR 02/14/2015 36.36* >60.00 mL/min Final

## 2015-02-14 NOTE — Patient Instructions (Signed)
Reduce Novolog to 3 in am

## 2015-02-22 ENCOUNTER — Ambulatory Visit (INDEPENDENT_AMBULATORY_CARE_PROVIDER_SITE_OTHER): Payer: Medicare PPO | Admitting: Podiatry

## 2015-02-22 ENCOUNTER — Encounter: Payer: Self-pay | Admitting: Podiatry

## 2015-02-22 DIAGNOSIS — M79676 Pain in unspecified toe(s): Secondary | ICD-10-CM

## 2015-02-22 DIAGNOSIS — B351 Tinea unguium: Secondary | ICD-10-CM | POA: Diagnosis not present

## 2015-02-22 NOTE — Progress Notes (Signed)
Patient ID: Allison Rodgers, female   DOB: 02-27-1924, 81 y.o.   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

## 2015-02-22 NOTE — Patient Instructions (Signed)
Diabetes and Foot Care Diabetes may cause you to have problems because of poor blood supply (circulation) to your feet and legs. This may cause the skin on your feet to become thinner, break easier, and heal more slowly. Your skin may become dry, and the skin may peel and crack. You may also have nerve damage in your legs and feet causing decreased feeling in them. You may not notice minor injuries to your feet that could lead to infections or more serious problems. Taking care of your feet is one of the most important things you can do for yourself.  HOME CARE INSTRUCTIONS  Wear shoes at all times, even in the house. Do not go barefoot. Bare feet are easily injured.  Check your feet daily for blisters, cuts, and redness. If you cannot see the bottom of your feet, use a mirror or ask someone for help.  Wash your feet with warm water (do not use hot water) and mild soap. Then pat your feet and the areas between your toes until they are completely dry. Do not soak your feet as this can dry your skin.  Apply a moisturizing lotion or petroleum jelly (that does not contain alcohol and is unscented) to the skin on your feet and to dry, brittle toenails. Do not apply lotion between your toes.  Trim your toenails straight across. Do not dig under them or around the cuticle. File the edges of your nails with an emery board or nail file.  Do not cut corns or calluses or try to remove them with medicine.  Wear clean socks or stockings every day. Make sure they are not too tight. Do not wear knee-high stockings since they may decrease blood flow to your legs.  Wear shoes that fit properly and have enough cushioning. To break in new shoes, wear them for just a few hours a day. This prevents you from injuring your feet. Always look in your shoes before you put them on to be sure there are no objects inside.  Do not cross your legs. This may decrease the blood flow to your feet.  If you find a minor scrape,  cut, or break in the skin on your feet, keep it and the skin around it clean and dry. These areas may be cleansed with mild soap and water. Do not cleanse the area with peroxide, alcohol, or iodine.  When you remove an adhesive bandage, be sure not to damage the skin around it.  If you have a wound, look at it several times a day to make sure it is healing.  Do not use heating pads or hot water bottles. They may burn your skin. If you have lost feeling in your feet or legs, you may not know it is happening until it is too late.  Make sure your health care provider performs a complete foot exam at least annually or more often if you have foot problems. Report any cuts, sores, or bruises to your health care provider immediately. SEEK MEDICAL CARE IF:   You have an injury that is not healing.  You have cuts or breaks in the skin.  You have an ingrown nail.  You notice redness on your legs or feet.  You feel burning or tingling in your legs or feet.  You have pain or cramps in your legs and feet.  Your legs or feet are numb.  Your feet always feel cold. SEEK IMMEDIATE MEDICAL CARE IF:   There is increasing redness,   swelling, or pain in or around a wound.  There is a red line that goes up your leg.  Pus is coming from a wound.  You develop a fever or as directed by your health care provider.  You notice a bad smell coming from an ulcer or wound. Document Released: 07/26/2000 Document Revised: 03/31/2013 Document Reviewed: 01/05/2013 Exit[employer] Patient Information 2015 [employer]. This information is not intended to replace advice given to you by your health care provider. Make sure you discuss any questions you have with your health care provider.  

## 2015-03-01 ENCOUNTER — Other Ambulatory Visit: Payer: Self-pay | Admitting: Gastroenterology

## 2015-03-01 DIAGNOSIS — R1033 Periumbilical pain: Secondary | ICD-10-CM

## 2015-03-06 ENCOUNTER — Inpatient Hospital Stay: Admission: RE | Admit: 2015-03-06 | Payer: Medicare PPO | Source: Ambulatory Visit

## 2015-03-09 ENCOUNTER — Ambulatory Visit
Admission: RE | Admit: 2015-03-09 | Discharge: 2015-03-09 | Disposition: A | Discharge status: Home / Self Care | Payer: Medicare PPO | Source: Ambulatory Visit | Attending: Gastroenterology | Admitting: Gastroenterology

## 2015-03-09 DIAGNOSIS — R1033 Periumbilical pain: Secondary | ICD-10-CM

## 2015-03-13 ENCOUNTER — Other Ambulatory Visit: Payer: Self-pay | Admitting: Hematology and Oncology

## 2015-05-17 ENCOUNTER — Ambulatory Visit (INDEPENDENT_AMBULATORY_CARE_PROVIDER_SITE_OTHER): Payer: Medicare PPO | Admitting: Endocrinology

## 2015-05-17 VITALS — BP 126/58 | HR 66 | Temp 98.9°F | Resp 16 | Ht 64.0 in | Wt 187.6 lb

## 2015-05-17 DIAGNOSIS — E1165 Type 2 diabetes mellitus with hyperglycemia: Secondary | ICD-10-CM | POA: Diagnosis not present

## 2015-05-17 DIAGNOSIS — E1121 Type 2 diabetes mellitus with diabetic nephropathy: Secondary | ICD-10-CM

## 2015-05-17 DIAGNOSIS — Z794 Long term (current) use of insulin: Secondary | ICD-10-CM

## 2015-05-17 DIAGNOSIS — E119 Type 2 diabetes mellitus without complications: Secondary | ICD-10-CM

## 2015-05-17 DIAGNOSIS — IMO0002 Reserved for concepts with insufficient information to code with codable children: Secondary | ICD-10-CM

## 2015-05-17 LAB — LIPID PANEL
CHOLESTEROL: 128 mg/dL (ref 0–200)
HDL: 44 mg/dL (ref >39.00)
LDL Cholesterol: 66 mg/dL (ref 0–99)
NonHDL: 83.56
TRIGLYCERIDES: 86 mg/dL (ref 0.0–149.0)
Total CHOL/HDL Ratio: 3
VLDL: 17.2 mg/dL (ref 0.0–40.0)

## 2015-05-17 LAB — HEMOGLOBIN A1C: Hgb A1c MFr Bld: 6 % (ref 4.6–6.5)

## 2015-05-17 LAB — GLUCOSE, RANDOM: Glucose, Bld: 147 mg/dL — ABNORMAL HIGH (ref 70–99)

## 2015-05-17 NOTE — Patient Instructions (Signed)
Novolog should be given at the time when the patient is getting her meal tray

## 2015-05-17 NOTE — Progress Notes (Signed)
Patient ID: Allison Rodgers, female   DOB: [birth year], 81 y.o.   MRN: [id number]   Reason for Appointment : Followup for Type 2 Diabetes  History of Present llness          Diagnosis: Type 2 diabetes mellitus, date of diagnosis: ? 2007       Past history:  Apparently she had blood sugars over 500 at the time of diagnosis She was started on insulin at the time of diagnosis and has been continued on insulin with various regimens Also her weight had been previously significantly more, about 200 pounds Her blood sugars  were higher in late December/early January especially after meals, with highest 356.  With this she was started on NovoLog 5 units before meals at the end of January  Recent history:   INSULIN regimen: Lantus 14 units a.m. and 8 units p.m..  NovoLog 3 before breakfast--3 units before dinner  She continues to take LANTUS twice a day along with small doses of Novolog at breakfast and supper After discussing with the nursing home it appears that she is getting insulin doses are fixed times rather than when she is ready to eat On her last visit her blood sugars were fairly good and no changes were made  More recently her blood sugars have been fairly good with sporadic high readings before supper and occasionally at night Currently not needing coverage for lunch Fasting blood sugars are very stable and usually fairly good at lunch also She thinks she is eating fairly well at all meals, diet is variable and may not be always low fat  Is not very active and is not able to lose weight  Hemoglobin A1c: This tends to be lower than expected probably because of her renal insufficiency but was relatively higher at 7.1 in 2/16  Glucose monitoring:  done 2 times  a day.   Glucometer: ?     Blood Glucose readings from nursing home record over the last 2 weeks:  Mean values apply above for all meters except median for One Touch  PRE-MEAL Fasting Lunch Dinner Bedtime Overall   Glucose range:  90-138   97-152   130-232   125-203    Mean/median:    150       Hypoglycemia : None       Meals: 3 meals per day.  breakfast:  Egg, toast, juice and sausage  Physical activity: exercise: Trying to walk in her nursing home, her son say that she is not doing this often Dietician consultations: Several years ago             Retinal exam: Most recent:.2014    Wt Readings from Last 3 Encounters:  05/17/15 187 lb 9.6 oz (85.095 kg)  02/14/15 188 lb 9.6 oz (85.548 kg)  02/11/15 182 lb (82.555 kg)   LABS:  Lab Results  Component Value Date   HGBA1C 6.0 05/17/2015   HGBA1C 5.9 02/14/2015   HGBA1C 7.1* 09/28/2014   Lab Results  Component Value Date   MICROALBUR 0.2 11/15/2013   LDLCALC 66 05/17/2015   CREATININE 1.70* 02/14/2015       Medication List       This list is accurate as of: 05/17/15 11:59 PM.  Always use your most recent med list.               acetaminophen 500 MG tablet  Commonly known as:  TYLENOL  Take 1 tablet (500 mg total) by mouth  every 6 (six) hours as needed for moderate pain.     ALIGN 4 MG Caps  Take 1 tablet by mouth daily.     aspirin 81 MG chewable tablet  Chew 81 mg by mouth daily.     Cranberry 200 MG Caps  Take 2 capsules by mouth 2 (two) times daily.     dorzolamide 2 % ophthalmic solution  Commonly known as:  TRUSOPT  Place 1 drop into both eyes at bedtime.     feeding supplement (GLUCERNA SHAKE) Liqd  Take 237 mLs by mouth 3 (three) times daily between meals.     fluocinonide cream 0.05 %  Commonly known as:  LIDEX  Apply 1 application topically 2 (two) times daily as needed.     furosemide 20 MG tablet  Commonly known as:  LASIX  Take 1 tablet (20 mg total) by mouth daily.     gabapentin 100 MG capsule  Commonly known as:  NEURONTIN     guaifenesin 100 MG/5ML syrup  Commonly known as:  ROBITUSSIN  Take 200 mg by mouth as needed for cough. Take 10 ML every 6 hours as needed for cough      guaiFENesin-dextromethorphan 100-10 MG/5ML syrup  Commonly known as:  ROBITUSSIN DM  Take 5 mLs by mouth 3 (three) times daily as needed for cough.     insulin glargine 100 UNIT/ML injection  Commonly known as:  LANTUS  Inject 8-14 Units into the skin 2 (two) times daily. Inject 14 units in am and 8 units in pm     lisinopril 2.5 MG tablet  Commonly known as:  PRINIVIL,ZESTRIL  Take 1.25 mg by mouth daily.     loperamide 2 MG capsule  Commonly known as:  IMODIUM  Take by mouth as needed for diarrhea or loose stools.     NOVOLOG 100 UNIT/ML injection  Generic drug:  insulin aspart  Inject 3 units subcutaneously before dinner     nystatin 100000 UNIT/ML suspension  Commonly known as:  MYCOSTATIN     omeprazole 20 MG capsule  Commonly known as:  PRILOSEC  Take 20 mg by mouth daily.     pantoprazole 40 MG tablet  Commonly known as:  PROTONIX  Take 1 tablet (40 mg total) by mouth 2 (two) times daily.     ranitidine 150 MG tablet  Commonly known as:  ZANTAC  Take 150 mg by mouth 2 (two) times daily.     sodium chloride 0.65 % Soln nasal spray  Commonly known as:  OCEAN  Place 2 sprays into both nostrils as needed for congestion.     sorbitol 70 % solution  Take 15 mLs by mouth daily as needed.     spironolactone 25 MG tablet  Commonly known as:  ALDACTONE  Take 25 mg by mouth 2 (two) times daily.     XALATAN 0.005 % ophthalmic solution  Generic drug:  latanoprost  1 drop at bedtime.        Allergies:  Allergies  Allergen Reactions  . Penicillins Other (See Comments)    REACTION: causes hallucinations  . Sulfamethoxazole-Trimethoprim Swelling    REACTION: rash  . Bactrim     Unknown per mar  . Trimethoprim     Unknown per mar     Past Medical History  Diagnosis Date  . GERD (gastroesophageal reflux disease)   . Chronic abdominal pain   . DJD (degenerative joint disease)   . Hypertensive cardiovascular disease   . Obesity   .  Gastritis, chronic   .  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 Esophageal stricture   . Hiatal hernia   . Hx of adenomatous colonic polyps   . Diverticulosis   . Dyslipidemia   . Arthritis   . DM (diabetes mellitus)   . Hypertension   . Microcytic anemia   . Edema   . Renal disorder   . Bacterial meningitis     Past Surgical History  Procedure Laterality Date  . Appendectomy    . Cholecystectomy  1999  . Vesicovaginal fistula closure w/ tah  1957  . Bilateral oophorectomy  1957  . Hernia repair  [id number]    umbilical and ventral hernia repair by Dr. Margot Chimes  . Rotator cuff repair  7/05    right  . Back surgery      Family History  Problem Relation Age of Onset  . Stroke Mother   . Cancer Father   . Hypertension Other     Social History:  reports that she has never smoked. She has never used smokeless tobacco. She reports that she does not drink alcohol or use illicit drugs.    Review of Systems    Lipids: She is currently not on any medications   Lab Results  Component Value Date   CHOL 128 05/17/2015   HDL 44.00 05/17/2015   LDLCALC 66 05/17/2015   TRIG 86.0 05/17/2015   CHOLHDL 3 05/17/2015     She is still taking diuretics including spironolactone. Also has had mild hyponatremia  Last diabetic foot exam was done in 7/16  Physical Examination:  BP 126/58 mmHg  Pulse 66  Temp(Src) 98.9 F (37.2 C)  Resp 16  Ht 5\' 4"  (1.626 m)  Wt 187 lb 9.6 oz (85.095 kg)  BMI 32.19 kg/m2  SpO2 97%     ASSESSMENT:  Diabetes type 2   Her glucose readings seem to be fairly good with using low doses of NovoLog twice a in addition to her twice a day Lantus regimen A1c to be checked today Despite her variable diet her blood sugars are reasonably good with only occasional high readings in the afternoon when she is not getting mealtime coverage She continues to be able to take her basal bolus insulin regimen administered at the nursing home  PLAN:    Will send instructions the nursing home to make sure her Novolog was given right before eating No change in insulin dosages A1c to be checked today Encouraged her to be as active as possible   Tyeson Tanimoto 05/18/2015, 8:30 AM   Addendum: Labs stable  Office Visit on 05/17/2015  Component Date Value Ref Range Status  . Hgb A1c MFr Bld 05/17/2015 6.0  4.6 - 6.5 % Final   Glycemic Control Guidelines for People with Diabetes:Non Diabetic:  <6%Goal of Therapy: <7%Additional Action Suggested:  >8%   . Glucose, Bld 05/17/2015 147* 70 - 99 mg/dL Final  . Cholesterol 05/17/2015 128  0 - 200 mg/dL Final   ATP III Classification       Desirable:  < 200 mg/dL               Borderline High:  200 - 239 mg/dL          High:  > = 240 mg/dL  . Triglycerides 05/17/2015 86.0  0.0 - 149.0 mg/dL Final   Normal:  <150 mg/dLBorderline High:  150 - 199 mg/dL  . HDL 05/17/2015 44.00  >39.00  mg/dL Final  . VLDL 05/17/2015 17.2  0.0 - 40.0 mg/dL Final  . LDL Cholesterol 05/17/2015 66  0 - 99 mg/dL Final  . Total CHOL/HDL Ratio 05/17/2015 3   Final                  Men          Women1/2 Average Risk     3.4          3.3Average Risk          5.0          4.42X Average Risk          9.6          7.13X Average Risk          15.0          11.0                      . NonHDL 05/17/2015 83.56   Final   NOTE:  Non-HDL goal should be 30 mg/dL higher than patient's LDL goal (i.e. LDL goal of < 70 mg/dL, would have non-HDL goal of < 100 mg/dL)

## 2015-05-22 ENCOUNTER — Other Ambulatory Visit: Payer: Self-pay | Admitting: Obstetrics and Gynecology

## 2015-05-22 DIAGNOSIS — R102 Pelvic and perineal pain: Secondary | ICD-10-CM

## 2015-05-24 ENCOUNTER — Encounter: Payer: Self-pay | Admitting: Podiatry

## 2015-05-24 ENCOUNTER — Ambulatory Visit (INDEPENDENT_AMBULATORY_CARE_PROVIDER_SITE_OTHER): Payer: Medicare PPO | Admitting: Podiatry

## 2015-05-24 DIAGNOSIS — B351 Tinea unguium: Secondary | ICD-10-CM | POA: Diagnosis not present

## 2015-05-24 DIAGNOSIS — M79676 Pain in unspecified toe(s): Secondary | ICD-10-CM

## 2015-05-24 NOTE — Patient Instructions (Signed)
Today I debrided the mycotic toenails right and left feet Return at three-month intervals for nail debridement  Diabetes and Foot Care Diabetes may cause you to have problems because of poor blood supply (circulation) to your feet and legs. This may cause the skin on your feet to become thinner, break easier, and heal more slowly. Your skin may become dry, and the skin may peel and crack. You may also have nerve damage in your legs and feet causing decreased feeling in them. You may not notice minor injuries to your feet that could lead to infections or more serious problems. Taking care of your feet is one of the most important things you can do for yourself.  HOME CARE INSTRUCTIONS  Wear shoes at all times, even in the house. Do not go barefoot. Bare feet are easily injured.  Check your feet daily for blisters, cuts, and redness. If you cannot see the bottom of your feet, use a mirror or ask someone for help.  Wash your feet with warm water (do not use hot water) and mild soap. Then pat your feet and the areas between your toes until they are completely dry. Do not soak your feet as this can dry your skin.  Apply a moisturizing lotion or petroleum jelly (that does not contain alcohol and is unscented) to the skin on your feet and to dry, brittle toenails. Do not apply lotion between your toes.  Trim your toenails straight across. Do not dig under them or around the cuticle. File the edges of your nails with an emery board or nail file.  Do not cut corns or calluses or try to remove them with medicine.  Wear clean socks or stockings every day. Make sure they are not too tight. Do not wear knee-high stockings since they may decrease blood flow to your legs.  Wear shoes that fit properly and have enough cushioning. To break in new shoes, wear them for just a few hours a day. This prevents you from injuring your feet. Always look in your shoes before you put them on to be sure there are no objects  inside.  Do not cross your legs. This may decrease the blood flow to your feet.  If you find a minor scrape, cut, or break in the skin on your feet, keep it and the skin around it clean and dry. These areas may be cleansed with mild soap and water. Do not cleanse the area with peroxide, alcohol, or iodine.  When you remove an adhesive bandage, be sure not to damage the skin around it.  If you have a wound, look at it several times a day to make sure it is healing.  Do not use heating pads or hot water bottles. They may burn your skin. If you have lost feeling in your feet or legs, you may not know it is happening until it is too late.  Make sure your health care provider performs a complete foot exam at least annually or more often if you have foot problems. Report any cuts, sores, or bruises to your health care provider immediately. SEEK MEDICAL CARE IF:   You have an injury that is not healing.  You have cuts or breaks in the skin.  You have an ingrown nail.  You notice redness on your legs or feet.  You feel burning or tingling in your legs or feet.  You have pain or cramps in your legs and feet.  Your legs or feet are  numb.  Your feet always feel cold. SEEK IMMEDIATE MEDICAL CARE IF:   There is increasing redness, swelling, or pain in or around a wound.  There is a red line that goes up your leg.  Pus is coming from a wound.  You develop a fever or as directed by your health care provider.  You notice a bad smell coming from an ulcer or wound.   This information is not intended to replace advice given to you by your health care provider. Make sure you discuss any questions you have with your health care provider.   Document Released: 07/26/2000 Document Revised: 03/31/2013 Document Reviewed: 01/05/2013 Elsevier Interactive Patient Education [employer].

## 2015-05-24 NOTE — Progress Notes (Signed)
Patient ID: Allison Rodgers, female   DOB: 1923-11-14, 81 y.o.   MRN: [id number]  Subjective: This patient presents for a scheduled visit complaining of painful toenails and requests toenail debridement  Objective: Orientated 3 No open skin lesions bilaterally The toenails are elongated, brittle, incurvated, discolored and tender to direct palpation 6-10  Assessment: Symptomatic onychomycoses 6-10 Diabetic with a history of neurological manifestations  Plan: Debridement toenails 10 mechanically and electronically without a bleeding  Reappoint 3 months

## 2015-05-29 ENCOUNTER — Ambulatory Visit
Admission: RE | Admit: 2015-05-29 | Discharge: 2015-05-29 | Disposition: A | Discharge status: Home / Self Care | Payer: Medicare PPO | Source: Ambulatory Visit | Attending: Obstetrics and Gynecology | Admitting: Obstetrics and Gynecology

## 2015-05-29 DIAGNOSIS — R102 Pelvic and perineal pain: Secondary | ICD-10-CM

## 2015-06-28 ENCOUNTER — Encounter: Payer: Self-pay | Admitting: Cardiology

## 2015-08-17 ENCOUNTER — Ambulatory Visit (INDEPENDENT_AMBULATORY_CARE_PROVIDER_SITE_OTHER): Payer: 59 | Admitting: Endocrinology

## 2015-08-17 ENCOUNTER — Encounter: Payer: Self-pay | Admitting: Endocrinology

## 2015-08-17 VITALS — BP 122/78 | HR 62 | Temp 97.7°F | Resp 16 | Ht 64.0 in | Wt 188.4 lb

## 2015-08-17 DIAGNOSIS — Z794 Long term (current) use of insulin: Secondary | ICD-10-CM | POA: Diagnosis not present

## 2015-08-17 DIAGNOSIS — E1121 Type 2 diabetes mellitus with diabetic nephropathy: Secondary | ICD-10-CM

## 2015-08-17 DIAGNOSIS — E1165 Type 2 diabetes mellitus with hyperglycemia: Secondary | ICD-10-CM | POA: Diagnosis not present

## 2015-08-17 DIAGNOSIS — IMO0002 Reserved for concepts with insufficient information to code with codable children: Secondary | ICD-10-CM

## 2015-08-17 DIAGNOSIS — IMO0001 Reserved for inherently not codable concepts without codable children: Secondary | ICD-10-CM

## 2015-08-17 LAB — LIPID PANEL
CHOLESTEROL: 136 mg/dL (ref 0–200)
HDL: 42.8 mg/dL (ref >39.00)
LDL Cholesterol: 80 mg/dL (ref 0–99)
NonHDL: 93.08
Total CHOL/HDL Ratio: 3
Triglycerides: 65 mg/dL (ref 0.0–149.0)
VLDL: 13 mg/dL (ref 0.0–40.0)

## 2015-08-17 LAB — COMPREHENSIVE METABOLIC PANEL
ALBUMIN: 3.4 g/dL — AB (ref 3.5–5.2)
ALK PHOS: 84 U/L (ref 39–117)
ALT: 9 U/L (ref 0–35)
AST: 15 U/L (ref 0–37)
BUN: 28 mg/dL — ABNORMAL HIGH (ref 6–23)
CALCIUM: 9.5 mg/dL (ref 8.4–10.5)
CHLORIDE: 109 meq/L (ref 96–112)
CO2: 28 mEq/L (ref 19–32)
Creatinine, Ser: 1.67 mg/dL — ABNORMAL HIGH (ref 0.40–1.20)
GFR: 37.07 mL/min — AB (ref >60.00)
Glucose, Bld: 109 mg/dL — ABNORMAL HIGH (ref 70–99)
POTASSIUM: 4.1 meq/L (ref 3.5–5.1)
Sodium: 142 mEq/L (ref 135–145)
TOTAL PROTEIN: 7.2 g/dL (ref 6.0–8.3)
Total Bilirubin: 0.4 mg/dL (ref 0.2–1.2)

## 2015-08-17 LAB — POCT GLYCOSYLATED HEMOGLOBIN (HGB A1C): HEMOGLOBIN A1C: 5.4

## 2015-08-17 NOTE — Progress Notes (Signed)
Patient ID: Allison Rodgers, female   DOB: 1923-10-10, 82 y.o.   MRN: [id number]   Reason for Appointment : Followup for Type 2 Diabetes  History of Present llness          Diagnosis: Type 2 diabetes mellitus, date of diagnosis: ? 2007       Past history:  Apparently she had blood sugars over 500 at the time of diagnosis She was started on insulin at the time of diagnosis and has been continued on insulin with various regimens Also her weight had been previously significantly more, about 200 pounds Her blood sugars  were higher in late December/early January especially after meals, with highest 356.  With this she was started on NovoLog 5 units before meals at the end of January  Recent history:   INSULIN regimen: Lantus 14 units a.m. and 8 units p.m..  NovoLog 3 before breakfast--3 units before dinner  She has been on LANTUS twice a day along with small doses of Novolog at breakfast and supper No changes in dosage was made on the last visit On her last visit her nursing home was instructed to make sure her Novolog is given that before eating  Blood sugars by review of the last month show excellent readings with only occasional mild increase in blood sugar at suppertime or bedtime even without any coverage for lunch Is not very active and is not able to lose weight  Hemoglobin A1c: This tends to be lower than expected probably because of her renal insufficiency and is now down to 5.4  Glucose monitoring:  done 2 times  a day.   Glucometer: ?     Blood Glucose readings from nursing home record in December:  Mean values apply above for all meters except median for One Touch  PRE-MEAL Fasting Lunch Dinner Bedtime Overall  Glucose range: 88-144 101-169  130-185 130-223   Mean/median:           Hypoglycemia : None       Meals: 3 meals per day.  breakfast:  Egg, toast, juice and sausage  Physical activity: exercise: Trying to walk in her nursing home, her son say that  she is not doing this often Dietician consultations: Several years ago             Retinal exam: Most recent:.2014    Wt Readings from Last 3 Encounters:  08/17/15 188 lb 6.4 oz (85.458 kg)  05/17/15 187 lb 9.6 oz (85.095 kg)  02/14/15 188 lb 9.6 oz (85.548 kg)   LABS:  Lab Results  Component Value Date   HGBA1C 5.4 08/17/2015   HGBA1C 6.0 05/17/2015   HGBA1C 5.9 02/14/2015   Lab Results  Component Value Date   MICROALBUR 0.2 11/15/2013   LDLCALC 66 05/17/2015   CREATININE 1.70* 02/14/2015       Medication List       This list is accurate as of: 08/17/15  1:24 PM.  Always use your most recent med list.               acetaminophen 500 MG tablet  Commonly known as:  TYLENOL  Take 1 tablet (500 mg total) by mouth every 6 (six) hours as needed for moderate pain.     ALIGN 4 MG Caps  Take 1 tablet by mouth daily.     aspirin 81 MG chewable tablet  Chew 81 mg by mouth daily.     Cranberry 200 MG Caps  Take  2 capsules by mouth 2 (two) times daily.     dorzolamide 2 % ophthalmic solution  Commonly known as:  TRUSOPT  Place 1 drop into both eyes at bedtime.     feeding supplement (GLUCERNA SHAKE) Liqd  Take 237 mLs by mouth 3 (three) times daily between meals.     fluocinonide cream 0.05 %  Commonly known as:  LIDEX  Apply 1 application topically 2 (two) times daily as needed.     FLUZONE HIGH-DOSE 0.5 ML Susy  Generic drug:  Influenza Vac Split High-Dose  ADM 0.5ML IM UTD     furosemide 20 MG tablet  Commonly known as:  LASIX  Take 1 tablet (20 mg total) by mouth daily.     gabapentin 100 MG capsule  Commonly known as:  NEURONTIN     guaifenesin 100 MG/5ML syrup  Commonly known as:  ROBITUSSIN  Take 200 mg by mouth as needed for cough. Take 10 ML every 6 hours as needed for cough     guaiFENesin-dextromethorphan 100-10 MG/5ML syrup  Commonly known as:  ROBITUSSIN DM  Take 5 mLs by mouth 3 (three) times daily as needed for cough.     insulin glargine  100 UNIT/ML injection  Commonly known as:  LANTUS  Inject 8-14 Units into the skin 2 (two) times daily. Inject 14 units in am and 8 units in pm     lisinopril 2.5 MG tablet  Commonly known as:  PRINIVIL,ZESTRIL  Take 1.25 mg by mouth daily.     loperamide 2 MG capsule  Commonly known as:  IMODIUM  Take by mouth as needed for diarrhea or loose stools.     NOVOLOG 100 UNIT/ML injection  Generic drug:  insulin aspart  Inject 3 units subcutaneously before dinner     nystatin 100000 UNIT/ML suspension  Commonly known as:  MYCOSTATIN     omeprazole 20 MG capsule  Commonly known as:  PRILOSEC  Take 20 mg by mouth daily.     pantoprazole 40 MG tablet  Commonly known as:  PROTONIX  Take 1 tablet (40 mg total) by mouth 2 (two) times daily.     ranitidine 150 MG tablet  Commonly known as:  ZANTAC  Take 150 mg by mouth 2 (two) times daily.     sodium chloride 0.65 % Soln nasal spray  Commonly known as:  OCEAN  Place 2 sprays into both nostrils as needed for congestion.     sorbitol 70 % solution  Take 15 mLs by mouth daily as needed.     spironolactone 25 MG tablet  Commonly known as:  ALDACTONE  Take 25 mg by mouth 2 (two) times daily.     XALATAN 0.005 % ophthalmic solution  Generic drug:  latanoprost  1 drop at bedtime.        Allergies:  Allergies  Allergen Reactions  . Penicillins Other (See Comments)    REACTION: causes hallucinations  . Sulfamethoxazole-Trimethoprim Swelling    REACTION: rash  . Bactrim     Unknown per mar  . Trimethoprim     Unknown per mar     Past Medical History  Diagnosis Date  . GERD (gastroesophageal reflux disease)   . Chronic abdominal pain   . DJD (degenerative joint disease)   . Hypertensive cardiovascular disease   . Obesity   . Gastritis, chronic   . 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 Esophageal stricture   . Hiatal  hernia   . Hx of adenomatous colonic polyps   . Diverticulosis     . Dyslipidemia   . Arthritis   . DM (diabetes mellitus) ([place])   . Hypertension   . Microcytic anemia   . Edema   . Renal disorder   . Bacterial meningitis     Past Surgical History  Procedure Laterality Date  . Appendectomy    . Cholecystectomy  1999  . Vesicovaginal fistula closure w/ tah  1957  . Bilateral oophorectomy  1957  . Hernia repair  [id number]    umbilical and ventral hernia repair by Dr. Margot Chimes  . Rotator cuff repair  7/05    right  . Back surgery      Family History  Problem Relation Age of Onset  . Stroke Mother   . Cancer Father   . Hypertension Other     Social History:  reports that she has never smoked. She has never used smokeless tobacco. She reports that she does not drink alcohol or use illicit drugs.    Review of Systems    RENAL insufficiency: She does not see a nephrologist for this   Lipids: She is currently not on any medications   Lab Results  Component Value Date   CHOL 128 05/17/2015   HDL 44.00 05/17/2015   LDLCALC 66 05/17/2015   TRIG 86.0 05/17/2015   CHOLHDL 3 05/17/2015     She is taking diuretics including spironolactone.   Lab Results  Component Value Date   CREATININE 1.70* 02/14/2015   BUN 27* 02/14/2015   NA 142 02/14/2015   K 3.9 02/14/2015   CL 110 02/14/2015   CO2 24 02/14/2015     Last diabetic foot exam was done in 7/16  Physical Examination:  BP 122/78 mmHg  Pulse 62  Temp(Src) 97.7 F (36.5 C)  Resp 16  Ht 5\' 4"  (1.626 m)  Wt 188 lb 6.4 oz (85.458 kg)  BMI 32.32 kg/m2  SpO2 95%     ASSESSMENT:  Diabetes type 2   A1c  is excellent at 5.4 and improved See history of present illness for detailed discussion of his current management, blood sugar patterns and problems identified to be checked today She continues to be on low-dose basal bolus insulin with coverage of meals at breakfast and supper only Blood sugars are fairly good at the nursing home despite some variability in her diet and  minimal exercise  PLAN:   No change. Discussed with her son that if he wants to take her home he could probably arrange for someone to give her insulin at breakfast and supper and she will not be able to get off insulin    Allison Rodgers 08/17/2015, 1:24 PM

## 2015-09-06 ENCOUNTER — Encounter: Payer: Self-pay | Admitting: Podiatry

## 2015-09-06 ENCOUNTER — Ambulatory Visit (INDEPENDENT_AMBULATORY_CARE_PROVIDER_SITE_OTHER): Payer: Medicare Other | Admitting: Podiatry

## 2015-09-06 DIAGNOSIS — M79676 Pain in unspecified toe(s): Secondary | ICD-10-CM

## 2015-09-06 DIAGNOSIS — B351 Tinea unguium: Secondary | ICD-10-CM | POA: Diagnosis not present

## 2015-09-06 NOTE — Patient Instructions (Signed)
Diabetes and Foot Care Diabetes may cause you to have problems because of poor blood supply (circulation) to your feet and legs. This may cause the skin on your feet to become thinner, break easier, and heal more slowly. Your skin may become dry, and the skin may peel and crack. You may also have nerve damage in your legs and feet causing decreased feeling in them. You may not notice minor injuries to your feet that could lead to infections or more serious problems. Taking care of your feet is one of the most important things you can do for yourself.  HOME CARE INSTRUCTIONS  Wear shoes at all times, even in the house. Do not go barefoot. Bare feet are easily injured.  Check your feet daily for blisters, cuts, and redness. If you cannot see the bottom of your feet, use a mirror or ask someone for help.  Wash your feet with warm water (do not use hot water) and mild soap. Then pat your feet and the areas between your toes until they are completely dry. Do not soak your feet as this can dry your skin.  Apply a moisturizing lotion or petroleum jelly (that does not contain alcohol and is unscented) to the skin on your feet and to dry, brittle toenails. Do not apply lotion between your toes.  Trim your toenails straight across. Do not dig under them or around the cuticle. File the edges of your nails with an emery board or nail file.  Do not cut corns or calluses or try to remove them with medicine.  Wear clean socks or stockings every day. Make sure they are not too tight. Do not wear knee-high stockings since they may decrease blood flow to your legs.  Wear shoes that fit properly and have enough cushioning. To break in new shoes, wear them for just a few hours a day. This prevents you from injuring your feet. Always look in your shoes before you put them on to be sure there are no objects inside.  Do not cross your legs. This may decrease the blood flow to your feet.  If you find a minor scrape,  cut, or break in the skin on your feet, keep it and the skin around it clean and dry. These areas may be cleansed with mild soap and water. Do not cleanse the area with peroxide, alcohol, or iodine.  When you remove an adhesive bandage, be sure not to damage the skin around it.  If you have a wound, look at it several times a day to make sure it is healing.  Do not use heating pads or hot water bottles. They may burn your skin. If you have lost feeling in your feet or legs, you may not know it is happening until it is too late.  Make sure your health care provider performs a complete foot exam at least annually or more often if you have foot problems. Report any cuts, sores, or bruises to your health care provider immediately. SEEK MEDICAL CARE IF:   You have an injury that is not healing.  You have cuts or breaks in the skin.  You have an ingrown nail.  You notice redness on your legs or feet.  You feel burning or tingling in your legs or feet.  You have pain or cramps in your legs and feet.  Your legs or feet are numb.  Your feet always feel cold. SEEK IMMEDIATE MEDICAL CARE IF:   There is increasing redness,   swelling, or pain in or around a wound.  There is a red line that goes up your leg.  Pus is coming from a wound.  You develop a fever or as directed by your health care provider.  You notice a bad smell coming from an ulcer or wound.   This information is not intended to replace advice given to you by your health care provider. Make sure you discuss any questions you have with your health care provider.   Document Released: 07/26/2000 Document Revised: 03/31/2013 Document Reviewed: 01/05/2013 Elsevier Interactive Patient Education 2016 [employer]  

## 2015-09-07 NOTE — Progress Notes (Signed)
Patient ID: Allison Rodgers, female   DOB: July 29, 1924, 82 y.o.   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

## 2015-11-15 ENCOUNTER — Ambulatory Visit (INDEPENDENT_AMBULATORY_CARE_PROVIDER_SITE_OTHER): Payer: 59 | Admitting: Endocrinology

## 2015-11-15 ENCOUNTER — Encounter: Payer: Self-pay | Admitting: Endocrinology

## 2015-11-15 VITALS — BP 120/62 | HR 61 | Temp 98.3°F | Resp 14 | Ht 64.0 in | Wt 182.0 lb

## 2015-11-15 DIAGNOSIS — E1165 Type 2 diabetes mellitus with hyperglycemia: Secondary | ICD-10-CM

## 2015-11-15 DIAGNOSIS — Z794 Long term (current) use of insulin: Secondary | ICD-10-CM | POA: Diagnosis not present

## 2015-11-15 DIAGNOSIS — N183 Chronic kidney disease, stage 3 (moderate): Secondary | ICD-10-CM | POA: Diagnosis not present

## 2015-11-15 DIAGNOSIS — IMO0002 Reserved for concepts with insufficient information to code with codable children: Secondary | ICD-10-CM

## 2015-11-15 DIAGNOSIS — E1122 Type 2 diabetes mellitus with diabetic chronic kidney disease: Secondary | ICD-10-CM

## 2015-11-15 LAB — BASIC METABOLIC PANEL
BUN: 30 mg/dL — ABNORMAL HIGH (ref 6–23)
CALCIUM: 9.7 mg/dL (ref 8.4–10.5)
CO2: 28 mEq/L (ref 19–32)
Chloride: 107 mEq/L (ref 96–112)
Creatinine, Ser: 1.79 mg/dL — ABNORMAL HIGH (ref 0.40–1.20)
GFR: 34.2 mL/min — AB (ref >60.00)
GLUCOSE: 122 mg/dL — AB (ref 70–99)
Potassium: 4.2 mEq/L (ref 3.5–5.1)
SODIUM: 140 meq/L (ref 135–145)

## 2015-11-15 LAB — MICROALBUMIN / CREATININE URINE RATIO
CREATININE, U: 93 mg/dL
Microalb Creat Ratio: 0.8 mg/g (ref 0.0–30.0)
Microalb, Ur: <0.7 mg/dL (ref 0.0–1.9)

## 2015-11-15 LAB — POCT GLYCOSYLATED HEMOGLOBIN (HGB A1C): HEMOGLOBIN A1C: 6

## 2015-11-15 NOTE — Patient Instructions (Signed)
May switch Novolog to same dose as Humalog  Same dose of Lantus

## 2015-11-15 NOTE — Progress Notes (Signed)
Patient ID: Allison Rodgers, female   DOB: [birth year], 82 y.o.   MRN: [id number]   Reason for Appointment : Followup for Type 2 Diabetes  History of Present llness          Diagnosis: Type 2 diabetes mellitus, date of diagnosis: ? 2007       Past history:  Apparently she had blood sugars over 500 at the time of diagnosis She was started on insulin at the time of diagnosis and has been continued on insulin with various regimens Also her weight had been previously significantly more, about 200 pounds Her blood sugars  were higher in late December/early January especially after meals, with highest 356.  With this she was started on NovoLog 5 units before meals at the end of January  Recent history:   INSULIN regimen: Lantus 14 units a.m. and 8 units p.m..  NovoLog 3 before breakfast--3 units before dinner  She has been on LANTUS twice a day along with small doses of Novolog at breakfast and supper No changes in dosage was made on the last visit  Blood sugars by review of the her nursing home records show the following readings and ranges Her highest readings are overall before supper and also variably after supper Blood sugars are usually not significantly high fasting or at lunchtime No hypoglycemia  Diet: She thinks she is trying to watch her portions and sometimes eating only salads at lunch and has lost weight  Hemoglobin A1c: This tends to be lower than expected probably because of her renal insufficiency and is now 6%, previously down to 5.4  Glucose monitoring:  done 2 times  a day.   Glucometer: ?     Blood Glucose readings from nursing home record in March/April:  Mean values apply above for all meters except median for One Touch  PRE-MEAL Fasting Lunch Dinner Bedtime Overall  Glucose range: 111-187 97-180 123-252 101-302   Mean/median:           Hypoglycemia : None       Meals: 3 meals per day.  breakfast:  Egg, toast, juice and sausage  Physical activity:  exercise: Trying to walk in her nursing home, her son say that she is not doing this often Dietician consultations: Several years ago            Wt Readings from Last 3 Encounters:  11/15/15 182 lb (82.555 kg)  08/17/15 188 lb 6.4 oz (85.458 kg)  05/17/15 187 lb 9.6 oz (85.095 kg)   LABS:  Lab Results  Component Value Date   HGBA1C 5.4 08/17/2015   HGBA1C 6.0 05/17/2015   HGBA1C 5.9 02/14/2015   Lab Results  Component Value Date   MICROALBUR 0.2 11/15/2013   LDLCALC 80 08/17/2015   CREATININE 1.67* 08/17/2015       Medication List       This list is accurate as of: 11/15/15 11:07 AM.  Always use your most recent med list.               acetaminophen 500 MG tablet  Commonly known as:  TYLENOL  Take 1 tablet (500 mg total) by mouth every 6 (six) hours as needed for moderate pain.     ALIGN 4 MG Caps  Take 1 tablet by mouth daily.     aspirin 81 MG chewable tablet  Chew 81 mg by mouth daily.     Cranberry 200 MG Caps  Take 2 capsules by mouth 2 (two)  times daily.     dorzolamide 2 % ophthalmic solution  Commonly known as:  TRUSOPT  Place 1 drop into both eyes at bedtime.     feeding supplement (GLUCERNA SHAKE) Liqd  Take 237 mLs by mouth 3 (three) times daily between meals.     fluocinonide cream 0.05 %  Commonly known as:  LIDEX  Apply 1 application topically 2 (two) times daily as needed.     FLUZONE HIGH-DOSE 0.5 ML Susy  Generic drug:  Influenza Vac Split High-Dose  ADM 0.5ML IM UTD     furosemide 20 MG tablet  Commonly known as:  LASIX  Take 1 tablet (20 mg total) by mouth daily.     gabapentin 100 MG capsule  Commonly known as:  NEURONTIN     guaifenesin 100 MG/5ML syrup  Commonly known as:  ROBITUSSIN  Take 200 mg by mouth as needed for cough. Take 10 ML every 6 hours as needed for cough     guaiFENesin-dextromethorphan 100-10 MG/5ML syrup  Commonly known as:  ROBITUSSIN DM  Take 5 mLs by mouth 3 (three) times daily as needed for cough.      insulin glargine 100 UNIT/ML injection  Commonly known as:  LANTUS  Inject 8-14 Units into the skin 2 (two) times daily. Inject 14 units in am and 8 units in pm     lisinopril 2.5 MG tablet  Commonly known as:  PRINIVIL,ZESTRIL  Take 1.25 mg by mouth daily.     lisinopril 5 MG tablet  Commonly known as:  PRINIVIL,ZESTRIL     loperamide 2 MG capsule  Commonly known as:  IMODIUM  Take by mouth as needed for diarrhea or loose stools.     NOVOLOG 100 UNIT/ML injection  Generic drug:  insulin aspart  Inject 3 Units into the skin 2 (two) times daily. Inject 3 units subcutaneously before dinner     nystatin 100000 UNIT/ML suspension  Commonly known as:  MYCOSTATIN     omeprazole 20 MG capsule  Commonly known as:  PRILOSEC  Take 20 mg by mouth daily.     pantoprazole 40 MG tablet  Commonly known as:  PROTONIX  Take 1 tablet (40 mg total) by mouth 2 (two) times daily.     ranitidine 150 MG tablet  Commonly known as:  ZANTAC  Take 150 mg by mouth 2 (two) times daily.     ranitidine 150 MG capsule  Commonly known as:  ZANTAC     sodium chloride 0.65 % Soln nasal spray  Commonly known as:  OCEAN  Place 2 sprays into both nostrils as needed for congestion.     sorbitol 70 % solution  Take 15 mLs by mouth daily as needed.     spironolactone 25 MG tablet  Commonly known as:  ALDACTONE  Take 25 mg by mouth 2 (two) times daily.     XALATAN 0.005 % ophthalmic solution  Generic drug:  latanoprost  1 drop at bedtime.        Allergies:  Allergies  Allergen Reactions  . Penicillins Other (See Comments)    REACTION: causes hallucinations  . Sulfamethoxazole-Trimethoprim Swelling    REACTION: rash  . Bactrim     Unknown per mar  . Trimethoprim     Unknown per mar     Past Medical History  Diagnosis Date  . GERD (gastroesophageal reflux disease)   . Chronic abdominal pain   . DJD (degenerative joint disease)   . Hypertensive cardiovascular disease   .  Obesity   .  Gastritis, chronic   . 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 Esophageal stricture   . Hiatal hernia   . Hx of adenomatous colonic polyps   . Diverticulosis   . Dyslipidemia   . Arthritis   . DM (diabetes mellitus) ([place])   . Hypertension   . Microcytic anemia   . Edema   . Renal disorder   . Bacterial meningitis     Past Surgical History  Procedure Laterality Date  . Appendectomy    . Cholecystectomy  1999  . Vesicovaginal fistula closure w/ tah  1957  . Bilateral oophorectomy  1957  . Hernia repair  [id number]    umbilical and ventral hernia repair by Dr. Margot Chimes  . Rotator cuff repair  7/05    right  . Back surgery      Family History  Problem Relation Age of Onset  . Stroke Mother   . Cancer Father   . Hypertension Other     Social History:  reports that she has never smoked. She has never used smokeless tobacco. She reports that she does not drink alcohol or use illicit drugs.    Review of Systems     Lipids: She is  not on any Statin drugs   Lab Results  Component Value Date   CHOL 136 08/17/2015   HDL 42.80 08/17/2015   LDLCALC 80 08/17/2015   TRIG 65.0 08/17/2015   CHOLHDL 3 08/17/2015    RENAL insufficiency: She does not see a nephrologist for this, Appears to be stable   She is taking diuretics including spironolactone.   Lab Results  Component Value Date   CREATININE 1.67* 08/17/2015   BUN 28* 08/17/2015   NA 142 08/17/2015   K 4.1 08/17/2015   CL 109 08/17/2015   CO2 28 08/17/2015     Last diabetic foot exam was done in 7/16  Physical Examination:  BP 120/62 mmHg  Pulse 61  Temp(Src) 98.3 F (36.8 C)  Resp 14  Ht 5\' 4"  (1.626 m)  Wt 182 lb (82.555 kg)  BMI 31.22 kg/m2  SpO2 96%     ASSESSMENT:  Diabetes type 2   A1c  is fairly good at 6% although this may be falsely low because of renal insufficiency  See history of present illness for detailed discussion of his current management, blood  sugar patterns and problems identified She continues to be on low-dose basal bolus insulin with coverage of meals at breakfast and supper only Blood sugars are variable after evening meal Although they are mostly higher after lunch/before supper they are not consistent and on some days she will eat only salads without carbohydrate.  Her nursing home probably will not be able to vary her insulin doses based on her intake especially since insulin was given before she goes to the dining room Fasting readings are mostly near target, slightly higher compared to last time  Since she has reasonable control considering her age will not need more aggressive treatment   PLAN:   No change in insulin. Also assessed fructosamine Follow-up labs today   [facility] 11/15/2015, 11:07 AM

## 2015-11-16 LAB — FRUCTOSAMINE: Fructosamine: 296 umol/L — ABNORMAL HIGH (ref 0–285)

## 2015-12-13 ENCOUNTER — Ambulatory Visit: Payer: Medicare Other | Admitting: Podiatry

## 2015-12-20 ENCOUNTER — Encounter: Payer: Self-pay | Admitting: Podiatry

## 2015-12-20 ENCOUNTER — Ambulatory Visit (INDEPENDENT_AMBULATORY_CARE_PROVIDER_SITE_OTHER): Payer: Medicare Other | Admitting: Podiatry

## 2015-12-20 DIAGNOSIS — B351 Tinea unguium: Secondary | ICD-10-CM | POA: Diagnosis not present

## 2015-12-20 DIAGNOSIS — M79676 Pain in unspecified toe(s): Secondary | ICD-10-CM

## 2015-12-20 NOTE — Patient Instructions (Signed)
Diabetes and Foot Care Diabetes may cause you to have problems because of poor blood supply (circulation) to your feet and legs. This may cause the skin on your feet to become thinner, break easier, and heal more slowly. Your skin may become dry, and the skin may peel and crack. You may also have nerve damage in your legs and feet causing decreased feeling in them. You may not notice minor injuries to your feet that could lead to infections or more serious problems. Taking care of your feet is one of the most important things you can do for yourself.  HOME CARE INSTRUCTIONS  Wear shoes at all times, even in the house. Do not go barefoot. Bare feet are easily injured.  Check your feet daily for blisters, cuts, and redness. If you cannot see the bottom of your feet, use a mirror or ask someone for help.  Wash your feet with warm water (do not use hot water) and mild soap. Then pat your feet and the areas between your toes until they are completely dry. Do not soak your feet as this can dry your skin.  Apply a moisturizing lotion or petroleum jelly (that does not contain alcohol and is unscented) to the skin on your feet and to dry, brittle toenails. Do not apply lotion between your toes.  Trim your toenails straight across. Do not dig under them or around the cuticle. File the edges of your nails with an emery board or nail file.  Do not cut corns or calluses or try to remove them with medicine.  Wear clean socks or stockings every day. Make sure they are not too tight. Do not wear knee-high stockings since they may decrease blood flow to your legs.  Wear shoes that fit properly and have enough cushioning. To break in new shoes, wear them for just a few hours a day. This prevents you from injuring your feet. Always look in your shoes before you put them on to be sure there are no objects inside.  Do not cross your legs. This may decrease the blood flow to your feet.  If you find a minor scrape,  cut, or break in the skin on your feet, keep it and the skin around it clean and dry. These areas may be cleansed with mild soap and water. Do not cleanse the area with peroxide, alcohol, or iodine.  When you remove an adhesive bandage, be sure not to damage the skin around it.  If you have a wound, look at it several times a day to make sure it is healing.  Do not use heating pads or hot water bottles. They may burn your skin. If you have lost feeling in your feet or legs, you may not know it is happening until it is too late.  Make sure your health care provider performs a complete foot exam at least annually or more often if you have foot problems. Report any cuts, sores, or bruises to your health care provider immediately. SEEK MEDICAL CARE IF:   You have an injury that is not healing.  You have cuts or breaks in the skin.  You have an ingrown nail.  You notice redness on your legs or feet.  You feel burning or tingling in your legs or feet.  You have pain or cramps in your legs and feet.  Your legs or feet are numb.  Your feet always feel cold. SEEK IMMEDIATE MEDICAL CARE IF:   There is increasing redness,   swelling, or pain in or around a wound.  There is a red line that goes up your leg.  Pus is coming from a wound.  You develop a fever or as directed by your health care provider.  You notice a bad smell coming from an ulcer or wound.   This information is not intended to replace advice given to you by your health care provider. Make sure you discuss any questions you have with your health care provider.   Document Released: 07/26/2000 Document Revised: 03/31/2013 Document Reviewed: 01/05/2013 Elsevier Interactive Patient Education 2016 [employer]  

## 2015-12-21 NOTE — Progress Notes (Signed)
Patient ID: Allison Rodgers, female   DOB: November 03, 1923, 82 y.o.   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

## 2016-02-14 ENCOUNTER — Ambulatory Visit (INDEPENDENT_AMBULATORY_CARE_PROVIDER_SITE_OTHER): Payer: 59 | Admitting: Endocrinology

## 2016-02-14 ENCOUNTER — Encounter: Payer: Self-pay | Admitting: Endocrinology

## 2016-02-14 VITALS — BP 118/60 | HR 68 | Ht 64.0 in | Wt 176.0 lb

## 2016-02-14 DIAGNOSIS — E119 Type 2 diabetes mellitus without complications: Secondary | ICD-10-CM

## 2016-02-14 DIAGNOSIS — N184 Chronic kidney disease, stage 4 (severe): Secondary | ICD-10-CM | POA: Diagnosis not present

## 2016-02-14 LAB — COMPREHENSIVE METABOLIC PANEL
ALK PHOS: 71 U/L (ref 39–117)
ALT: 7 U/L (ref 0–35)
AST: 13 U/L (ref 0–37)
Albumin: 3.4 g/dL — ABNORMAL LOW (ref 3.5–5.2)
BUN: 28 mg/dL — AB (ref 6–23)
CO2: 28 mEq/L (ref 19–32)
Calcium: 9.7 mg/dL (ref 8.4–10.5)
Chloride: 109 mEq/L (ref 96–112)
Creatinine, Ser: 1.74 mg/dL — ABNORMAL HIGH (ref 0.40–1.20)
GFR: 35.31 mL/min — ABNORMAL LOW (ref >60.00)
GLUCOSE: 124 mg/dL — AB (ref 70–99)
Potassium: 4.2 mEq/L (ref 3.5–5.1)
SODIUM: 140 meq/L (ref 135–145)
TOTAL PROTEIN: 7.3 g/dL (ref 6.0–8.3)
Total Bilirubin: 0.4 mg/dL (ref 0.2–1.2)

## 2016-02-14 LAB — POCT GLYCOSYLATED HEMOGLOBIN (HGB A1C): Hemoglobin A1C: 5.5

## 2016-02-14 NOTE — Addendum Note (Signed)
Addended by: Verlin Grills T on: 02/14/2016 02:29 PM   Modules accepted: Orders

## 2016-02-14 NOTE — Patient Instructions (Signed)
No change in insulin

## 2016-02-14 NOTE — Progress Notes (Signed)
Patient ID: Allison Rodgers, female   DOB: 09/10/1923, 82 y.o.   MRN: [id number]   Reason for Appointment : Followup for Type 2 Diabetes  History of Present llness          Diagnosis: Type 2 diabetes mellitus, date of diagnosis: ? 2007       Past history:  Apparently she had blood sugars over 500 at the time of diagnosis She was started on insulin at the time of diagnosis and has been continued on insulin with various regimens Also her weight had been previously significantly more, about 200 pounds Her blood sugars  were higher in late December/early January especially after meals, with highest 356.  With this she was started on NovoLog 5 units before meals at the end of January  Recent history:   INSULIN regimen: Lantus 14 units a.m. and 8 units p.m..  NovoLog 3 before breakfast--3 units before dinner  She has been on LANTUS twice a day along with small doses of Novolog at breakfast and supper No changes in dosage was made on the last visit  Blood sugars by review of the her nursing home records show some variability as before Her highest readings are tending to be after supper but are finally after lunch and not consistent Blood sugars are usually not significantly high fasting or at lunchtime No hypoglycemia  Diet: She apparently is not eating large portion and weight is coming down  Hemoglobin A1c: This tends to be lower than expected probably because of her renal insufficiency and is now 5.5  Glucose monitoring:  done 2-3 times  a day.   Glucometer: ?     Blood Glucose readings from nursing home record recently:  Mean values apply above for all meters except median for One Touch  PRE-MEAL Fasting Lunch Dinner Bedtime Overall  Glucose range: 101-155 6-171  70-208 102-351   Mean/median:           Hypoglycemia : None       Meals: 3 meals per day.  breakfast:  Egg, toast, juice and sausage  Physical activity: exercise: Trying to walk in her nursing home But  overall not very active Dietician consultations: Several years ago            Wt Readings from Last 3 Encounters:  02/14/16 176 lb (79.833 kg)  11/15/15 182 lb (82.555 kg)  08/17/15 188 lb 6.4 oz (85.458 kg)   LABS:  Lab Results  Component Value Date   HGBA1C 6.0 11/15/2015   HGBA1C 5.4 08/17/2015   HGBA1C 6.0 05/17/2015   Lab Results  Component Value Date   MICROALBUR <0.7 11/15/2015   LDLCALC 80 08/17/2015   CREATININE 1.79* 11/15/2015       Medication List       This list is accurate as of: 02/14/16  1:12 PM.  Always use your most recent med list.               acetaminophen 500 MG tablet  Commonly known as:  TYLENOL  Take 1 tablet (500 mg total) by mouth every 6 (six) hours as needed for moderate pain.     ALIGN 4 MG Caps  Take 1 tablet by mouth daily.     aspirin 81 MG chewable tablet  Chew 81 mg by mouth daily.     Cranberry 200 MG Caps  Take 2 capsules by mouth 2 (two) times daily.     dorzolamide 2 % ophthalmic solution  Commonly known  as:  TRUSOPT  Place 1 drop into both eyes at bedtime.     feeding supplement (GLUCERNA SHAKE) Liqd  Take 237 mLs by mouth 3 (three) times daily between meals.     fluocinonide cream 0.05 %  Commonly known as:  LIDEX  Apply 1 application topically 2 (two) times daily as needed.     FLUZONE HIGH-DOSE 0.5 ML Susy  Generic drug:  Influenza Vac Split High-Dose  Reported on 02/14/2016     furosemide 20 MG tablet  Commonly known as:  LASIX  Take 1 tablet (20 mg total) by mouth daily.     gabapentin 100 MG capsule  Commonly known as:  NEURONTIN  Reported on 02/14/2016     guaifenesin 100 MG/5ML syrup  Commonly known as:  ROBITUSSIN  Take 200 mg by mouth as needed for cough. Reported on 02/14/2016     guaiFENesin-dextromethorphan 100-10 MG/5ML syrup  Commonly known as:  ROBITUSSIN DM  Take 5 mLs by mouth 3 (three) times daily as needed for cough. Reported on 02/14/2016     insulin glargine 100 UNIT/ML injection    Commonly known as:  LANTUS  Inject 8-14 Units into the skin 2 (two) times daily. Inject 14 units in am and 8 units in pm     insulin lispro 100 UNIT/ML injection  Commonly known as:  HUMALOG  Inject 3 units before breakfast and 3 units before dinner.     lisinopril 2.5 MG tablet  Commonly known as:  PRINIVIL,ZESTRIL  Take 1.25 mg by mouth daily. Reported on 02/14/2016     lisinopril 5 MG tablet  Commonly known as:  PRINIVIL,ZESTRIL  Reported on 02/14/2016     loperamide 2 MG capsule  Commonly known as:  IMODIUM  Take by mouth as needed for diarrhea or loose stools.     NOVOLOG 100 UNIT/ML injection  Generic drug:  insulin aspart  Inject 3 Units into the skin 2 (two) times daily. Reported on 02/14/2016     nystatin 100000 UNIT/ML suspension  Commonly known as:  MYCOSTATIN  Reported on 02/14/2016     omeprazole 20 MG capsule  Commonly known as:  PRILOSEC  Take 20 mg by mouth daily. Reported on 02/14/2016     pantoprazole 40 MG tablet  Commonly known as:  PROTONIX  Take 1 tablet (40 mg total) by mouth 2 (two) times daily.     ranitidine 150 MG tablet  Commonly known as:  ZANTAC  Take 150 mg by mouth 2 (two) times daily.     ranitidine 150 MG capsule  Commonly known as:  ZANTAC     sodium chloride 0.65 % Soln nasal spray  Commonly known as:  OCEAN  Place 2 sprays into both nostrils as needed for congestion. Reported on 02/14/2016     sorbitol 70 % solution  Take 15 mLs by mouth daily as needed.     spironolactone 25 MG tablet  Commonly known as:  ALDACTONE  Take 25 mg by mouth 2 (two) times daily.     XALATAN 0.005 % ophthalmic solution  Generic drug:  latanoprost  1 drop at bedtime.        Allergies:  Allergies  Allergen Reactions  . Penicillins Other (See Comments)    REACTION: causes hallucinations  . Sulfamethoxazole-Trimethoprim Swelling    REACTION: rash  . Bactrim     Unknown per mar  . Trimethoprim     Unknown per mar     Past Medical History  Diagnosis Date  . GERD (gastroesophageal reflux disease)   . Chronic abdominal pain   . DJD (degenerative joint disease)   . Hypertensive cardiovascular disease   . Obesity   . Gastritis, chronic   . 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 Esophageal stricture   . Hiatal hernia   . Hx of adenomatous colonic polyps   . Diverticulosis   . Dyslipidemia   . Arthritis   . DM (diabetes mellitus) ([place])   . Hypertension   . Microcytic anemia   . Edema   . Renal disorder   . Bacterial meningitis     Past Surgical History  Procedure Laterality Date  . Appendectomy    . Cholecystectomy  1999  . Vesicovaginal fistula closure w/ tah  1957  . Bilateral oophorectomy  1957  . Hernia repair  [id number]    umbilical and ventral hernia repair by Dr. Margot Chimes  . Rotator cuff repair  7/05    right  . Back surgery      Family History  Problem Relation Age of Onset  . Stroke Mother   . Cancer Father   . Hypertension Other     Social History:  reports that she has never smoked. She has never used smokeless tobacco. She reports that she does not drink alcohol or use illicit drugs.    Review of Systems     Lipids: She is  not on any Statin drugs   Lab Results  Component Value Date   CHOL 136 08/17/2015   HDL 42.80 08/17/2015   LDLCALC 80 08/17/2015   TRIG 65.0 08/17/2015   CHOLHDL 3 08/17/2015    RENAL insufficiency: She does not see a nephrologist for this, Appears to be stable   She is taking diuretics including spironolactone.   Lab Results  Component Value Date   CREATININE 1.79* 11/15/2015   BUN 30* 11/15/2015   NA 140 11/15/2015   K 4.2 11/15/2015   CL 107 11/15/2015   CO2 28 11/15/2015     Last diabetic foot exam was done in 7/16  Physical Examination:  BP 118/60 mmHg  Pulse 68  Ht 5\' 4"  (1.626 m)  Wt 176 lb (79.833 kg)  BMI 30.20 kg/m2  SpO2 97%     ASSESSMENT:  Diabetes type 2 On insulin   See history of present  illness for detailed discussion of his current management, blood sugar patterns and problems identified She continues to be on  basal bolus insulin with coverage of meals at breakfast and supper only with small doses of Novolog  She is showing some variability in her blood sugars after lunch and dinner but this is based on her meals which generally cannot be regulated A1c is below 6, usually lower than expected because of her renal insufficiency No hypoglycemia Fasting blood sugars are fairly stable indicating adequate doses of Lantus  Since she has reasonable control considering her age will not need more aggressive treatment  PLAN:   No change in insulin. Follow-up labs today for chemistry Continue monitoring blood sugars as before at the nursing home   [facility] 02/14/2016, 1:12 PM

## 2016-03-27 ENCOUNTER — Encounter: Payer: Self-pay | Admitting: Podiatry

## 2016-03-27 ENCOUNTER — Ambulatory Visit (INDEPENDENT_AMBULATORY_CARE_PROVIDER_SITE_OTHER): Payer: Medicare Other | Admitting: Podiatry

## 2016-03-27 DIAGNOSIS — M79676 Pain in unspecified toe(s): Secondary | ICD-10-CM | POA: Diagnosis not present

## 2016-03-27 DIAGNOSIS — B351 Tinea unguium: Secondary | ICD-10-CM

## 2016-03-27 NOTE — Patient Instructions (Signed)
Diabetes and Foot Care Diabetes may cause you to have problems because of poor blood supply (circulation) to your feet and legs. This may cause the skin on your feet to become thinner, break easier, and heal more slowly. Your skin may become dry, and the skin may peel and crack. You may also have nerve damage in your legs and feet causing decreased feeling in them. You may not notice minor injuries to your feet that could lead to infections or more serious problems. Taking care of your feet is one of the most important things you can do for yourself.  HOME CARE INSTRUCTIONS  Wear shoes at all times, even in the house. Do not go barefoot. Bare feet are easily injured.  Check your feet daily for blisters, cuts, and redness. If you cannot see the bottom of your feet, use a mirror or ask someone for help.  Wash your feet with warm water (do not use hot water) and mild soap. Then pat your feet and the areas between your toes until they are completely dry. Do not soak your feet as this can dry your skin.  Apply a moisturizing lotion or petroleum jelly (that does not contain alcohol and is unscented) to the skin on your feet and to dry, brittle toenails. Do not apply lotion between your toes.  Trim your toenails straight across. Do not dig under them or around the cuticle. File the edges of your nails with an emery board or nail file.  Do not cut corns or calluses or try to remove them with medicine.  Wear clean socks or stockings every day. Make sure they are not too tight. Do not wear knee-high stockings since they may decrease blood flow to your legs.  Wear shoes that fit properly and have enough cushioning. To break in new shoes, wear them for just a few hours a day. This prevents you from injuring your feet. Always look in your shoes before you put them on to be sure there are no objects inside.  Do not cross your legs. This may decrease the blood flow to your feet.  If you find a minor scrape,  cut, or break in the skin on your feet, keep it and the skin around it clean and dry. These areas may be cleansed with mild soap and water. Do not cleanse the area with peroxide, alcohol, or iodine.  When you remove an adhesive bandage, be sure not to damage the skin around it.  If you have a wound, look at it several times a day to make sure it is healing.  Do not use heating pads or hot water bottles. They may burn your skin. If you have lost feeling in your feet or legs, you may not know it is happening until it is too late.  Make sure your health care provider performs a complete foot exam at least annually or more often if you have foot problems. Report any cuts, sores, or bruises to your health care provider immediately. SEEK MEDICAL CARE IF:   You have an injury that is not healing.  You have cuts or breaks in the skin.  You have an ingrown nail.  You notice redness on your legs or feet.  You feel burning or tingling in your legs or feet.  You have pain or cramps in your legs and feet.  Your legs or feet are numb.  Your feet always feel cold. SEEK IMMEDIATE MEDICAL CARE IF:   There is increasing redness,   swelling, or pain in or around a wound.  There is a red line that goes up your leg.  Pus is coming from a wound.  You develop a fever or as directed by your health care provider.  You notice a bad smell coming from an ulcer or wound.   This information is not intended to replace advice given to you by your health care provider. Make sure you discuss any questions you have with your health care provider.   Document Released: 07/26/2000 Document Revised: 03/31/2013 Document Reviewed: 01/05/2013 Elsevier Interactive Patient Education 2016 [employer]  

## 2016-03-27 NOTE — Progress Notes (Signed)
Patient ID: Allison Rodgers, female   DOB: 03-05-1924, 82 y.o.   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

## 2016-04-25 ENCOUNTER — Other Ambulatory Visit: Payer: Self-pay | Admitting: Internal Medicine

## 2016-04-25 DIAGNOSIS — R634 Abnormal weight loss: Secondary | ICD-10-CM

## 2016-04-25 DIAGNOSIS — R1030 Lower abdominal pain, unspecified: Secondary | ICD-10-CM

## 2016-05-03 ENCOUNTER — Ambulatory Visit
Admission: RE | Admit: 2016-05-03 | Discharge: 2016-05-03 | Disposition: A | Discharge status: Home / Self Care | Payer: Medicare Other | Source: Ambulatory Visit | Attending: Internal Medicine | Admitting: Internal Medicine

## 2016-05-03 ENCOUNTER — Other Ambulatory Visit: Payer: Self-pay | Admitting: Internal Medicine

## 2016-05-03 DIAGNOSIS — R634 Abnormal weight loss: Secondary | ICD-10-CM

## 2016-05-03 DIAGNOSIS — R1030 Lower abdominal pain, unspecified: Secondary | ICD-10-CM

## 2016-05-03 MED ORDER — IOPAMIDOL (ISOVUE-300) INJECTION 61%: 100.0000 mL | Freq: Once | INTRAVENOUS | Status: DC | PRN

## 2016-05-15 ENCOUNTER — Ambulatory Visit (INDEPENDENT_AMBULATORY_CARE_PROVIDER_SITE_OTHER): Payer: Medicare Other | Admitting: Endocrinology

## 2016-05-15 ENCOUNTER — Encounter: Payer: Self-pay | Admitting: Endocrinology

## 2016-05-15 VITALS — BP 123/67 | HR 73 | Ht 60.0 in | Wt 161.0 lb

## 2016-05-15 DIAGNOSIS — N184 Chronic kidney disease, stage 4 (severe): Secondary | ICD-10-CM | POA: Diagnosis not present

## 2016-05-15 DIAGNOSIS — E1165 Type 2 diabetes mellitus with hyperglycemia: Secondary | ICD-10-CM | POA: Diagnosis not present

## 2016-05-15 DIAGNOSIS — Z794 Long term (current) use of insulin: Secondary | ICD-10-CM

## 2016-05-15 LAB — LIPID PANEL
CHOLESTEROL: 128 mg/dL (ref 0–200)
HDL: 41.8 mg/dL (ref >39.00)
LDL CALC: 66 mg/dL (ref 0–99)
NonHDL: 86.05
TRIGLYCERIDES: 98 mg/dL (ref 0.0–149.0)
Total CHOL/HDL Ratio: 3
VLDL: 19.6 mg/dL (ref 0.0–40.0)

## 2016-05-15 LAB — CBC
HCT: 32.6 % — ABNORMAL LOW (ref 36.0–46.0)
Hemoglobin: 10.2 g/dL — ABNORMAL LOW (ref 12.0–15.0)
MCHC: 31.3 g/dL (ref 30.0–36.0)
MCV: 76.1 fl — AB (ref 78.0–100.0)
Platelets: 278 10*3/uL (ref 150.0–400.0)
RBC: 4.28 Mil/uL (ref 3.87–5.11)
RDW: 15.1 % (ref 11.5–15.5)
WBC: 9.7 10*3/uL (ref 4.0–10.5)

## 2016-05-15 LAB — HEMOGLOBIN A1C: Hgb A1c MFr Bld: 5.2 % (ref 4.6–6.5)

## 2016-05-15 NOTE — Progress Notes (Signed)
Patient ID: Allison Rodgers, female   DOB: [birth year], 82 y.o.   MRN: [id number]   Reason for Appointment : Followup for Type 2 Diabetes  History of Present llness          Diagnosis: Type 2 diabetes mellitus, date of diagnosis: ? 2007       Past history:  Apparently she had blood sugars over 500 at the time of diagnosis She was started on insulin at the time of diagnosis and has been continued on insulin with various regimens Also her weight had been previously significantly more, about 200 pounds Her blood sugars  were higher in late December/early January especially after meals, with highest 356.  With this she was started on NovoLog 5 units before meals at the end of January  Recent history:   INSULIN regimen: Lantus 14 units a.m. and 8 units p.m..  NovoLog 3 before breakfast--3 units before dinner  She has been on LANTUS twice a day along with small doses of Novolog at breakfast and supper No changes in dosage was made on the last visit  Blood sugars by review of the her nursing home records show overall good control She thinks she is not always finishing her meals and her appetite is variable Continues to lose some weight She does not have any particular time for high readings which are only occasional and very few recently May occasionally have higher readings possibly when going out to eat with family; also family brings in food for her  No hypoglycemia, lowest reading was 66 at lunchtime a week ago  Hemoglobin A1c: This tends to be lower than expected probably because of her renal insufficiency  Glucose monitoring:  done 2-3 times  a day.   Glucometer: ?     Blood Glucose readings from nursing home record recently:  Mean values apply above for all meters except median for One Touch  PRE-MEAL Fasting Lunch Dinner Bedtime Overall  Glucose range: 88-160  56-217  97-202  67-182    Mean/median:           Hypoglycemia : None       Meals: 3 meals per day.   breakfast:  Egg, toast, juice and sausage  Physical activity: exercise: Trying to walk in her nursing home But overall not very active Dietician consultations: Several years ago            Wt Readings from Last 3 Encounters:  05/15/16 161 lb (73 kg)  02/14/16 176 lb (79.8 kg)  11/15/15 182 lb (82.6 kg)   LABS:  Lab Results  Component Value Date   HGBA1C 5.5 02/14/2016   HGBA1C 6.0 11/15/2015   HGBA1C 5.4 08/17/2015   Lab Results  Component Value Date   MICROALBUR <0.7 11/15/2015   LDLCALC 80 08/17/2015   CREATININE 1.74 (H) 02/14/2016       Medication List       Accurate as of 05/15/16 11:09 AM. Always use your most recent med list.          acetaminophen 500 MG tablet Commonly known as:  TYLENOL Take 1 tablet (500 mg total) by mouth every 6 (six) hours as needed for moderate pain.   ALIGN 4 MG Caps Take 1 tablet by mouth daily.   aspirin 81 MG chewable tablet Chew 81 mg by mouth daily.   Cranberry 200 MG Caps Take 2 capsules by mouth 2 (two) times daily.   dorzolamide 2 % ophthalmic solution Commonly known as:  TRUSOPT Place 1 drop into both eyes at bedtime.   feeding supplement (GLUCERNA SHAKE) Liqd Take 237 mLs by mouth 3 (three) times daily between meals.   fluocinonide cream 0.05 % Commonly known as:  LIDEX Apply 1 application topically 2 (two) times daily as needed.   FLUZONE HIGH-DOSE 0.5 ML Susy Generic drug:  Influenza Vac Split High-Dose Reported on 02/14/2016   furosemide 20 MG tablet Commonly known as:  LASIX Take 1 tablet (20 mg total) by mouth daily.   gabapentin 100 MG capsule Commonly known as:  NEURONTIN Reported on 02/14/2016   guaifenesin 100 MG/5ML syrup Commonly known as:  ROBITUSSIN Take 200 mg by mouth as needed for cough. Reported on 02/14/2016   guaiFENesin-dextromethorphan 100-10 MG/5ML syrup Commonly known as:  ROBITUSSIN DM Take 5 mLs by mouth 3 (three) times daily as needed for cough. Reported on 02/14/2016   insulin  glargine 100 UNIT/ML injection Commonly known as:  LANTUS Inject 8-14 Units into the skin 2 (two) times daily. Inject 14 units in am and 8 units in pm   insulin lispro 100 UNIT/ML injection Commonly known as:  HUMALOG Inject 3 units before breakfast and 3 units before dinner.   lisinopril 2.5 MG tablet Commonly known as:  PRINIVIL,ZESTRIL Take 1.25 mg by mouth daily. Reported on 02/14/2016   lisinopril 5 MG tablet Commonly known as:  PRINIVIL,ZESTRIL Reported on 02/14/2016   loperamide 2 MG capsule Commonly known as:  IMODIUM Take by mouth as needed for diarrhea or loose stools.   NOVOLOG 100 UNIT/ML injection Generic drug:  insulin aspart Inject 3 Units into the skin 2 (two) times daily. Reported on 02/14/2016   nystatin 100000 UNIT/ML suspension Commonly known as:  MYCOSTATIN Reported on 02/14/2016   omeprazole 20 MG capsule Commonly known as:  PRILOSEC Take 20 mg by mouth daily. Reported on 02/14/2016   pantoprazole 40 MG tablet Commonly known as:  PROTONIX Take 1 tablet (40 mg total) by mouth 2 (two) times daily.   ranitidine 150 MG tablet Commonly known as:  ZANTAC Take 150 mg by mouth 2 (two) times daily.   ranitidine 150 MG capsule Commonly known as:  ZANTAC   sodium chloride 0.65 % Soln nasal spray Commonly known as:  OCEAN Place 2 sprays into both nostrils as needed for congestion. Reported on 02/14/2016   sorbitol 70 % solution Take 15 mLs by mouth daily as needed.   spironolactone 25 MG tablet Commonly known as:  ALDACTONE Take 25 mg by mouth 2 (two) times daily.   XALATAN 0.005 % ophthalmic solution Generic drug:  latanoprost 1 drop at bedtime.       Allergies:  Allergies  Allergen Reactions  . Penicillins Other (See Comments)    REACTION: causes hallucinations  . Sulfamethoxazole-Trimethoprim Swelling    REACTION: rash  . Bactrim     Unknown per mar  . Trimethoprim     Unknown per mar     Past Medical History:  Diagnosis Date  . Arthritis    . Bacterial meningitis   . 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 Chronic abdominal pain   . Diverticulosis   . DJD (degenerative joint disease)   . DM (diabetes mellitus) ([place])   . Dyslipidemia   . Edema   . Esophageal stricture   . Gastritis, chronic   . GERD (gastroesophageal reflux disease)   . Hiatal hernia   . Hx of adenomatous colonic polyps   . Hypertension   .  Hypertensive cardiovascular disease   . Microcytic anemia   . Obesity   . Renal disorder     Past Surgical History:  Procedure Laterality Date  . APPENDECTOMY    . BACK SURGERY    . BILATERAL OOPHORECTOMY  1957  . CHOLECYSTECTOMY  1999  . HERNIA REPAIR  [id number]   umbilical and ventral hernia repair by Dr. Margot Chimes  . ROTATOR CUFF REPAIR  7/05   right  . VESICOVAGINAL FISTULA CLOSURE W/ TAH  1957    Family History  Problem Relation Age of Onset  . Stroke Mother   . Cancer Father   . Hypertension Other     Social History:  reports that she has never smoked. She has never used smokeless tobacco. She reports that she does not drink alcohol or use drugs.    Review of Systems     Lipids: She is  not on any Statin drugs And usually lipids are fairly good  Lab Results  Component Value Date   CHOL 136 08/17/2015   HDL 42.80 08/17/2015   LDLCALC 80 08/17/2015   TRIG 65.0 08/17/2015   CHOLHDL 3 08/17/2015    RENAL insufficiency: She does not see a nephrologist    She is taking diuretics including spironolactone.   Lab Results  Component Value Date   CREATININE 1.74 (H) 02/14/2016   BUN 28 (H) 02/14/2016   NA 140 02/14/2016   K 4.2 02/14/2016   CL 109 02/14/2016   CO2 28 02/14/2016     Last diabetic foot exam was done in 7/16  Physical Examination:  BP 123/67   Pulse 73   Ht 5' (1.524 m)   Wt 161 lb (73 kg)   BMI 31.44 kg/m      ASSESSMENT:  Diabetes type 2 On insulin   See history of present illness for detailed discussion of his current  management, blood sugar patterns and problems identified She continues to be Well controlled on  basal bolus insulin  She is needing only small amounts of Novolog to cover meals at breakfast and supper only  She is showing some variability in her blood sugars but less recently Overall blood sugars appear to be lower A1c is usually below 6, usually lower than expected because of her renal insufficiency No hypoglycemia but since she has a couple of low-normal readings around lunchtime and occasionally suppertime will probably need less basal insulin during the day Fasting blood sugars are fairly stable indicating adequate dose of Lantus in the evening  Since she has reasonable control considering her age will not need more aggressive treatment  PLAN:   Reduce morning Lantus by 2 units Check A1c today Follow-up labs today for chemistry and anemia Continue monitoring blood sugars as before at the nursing home   [facility] 05/15/2016, 11:09 AM    Office Visit on 05/15/2016  Component Date Value Ref Range Status  . Hgb A1c MFr Bld 05/15/2016 5.2  4.6 - 6.5 % Final  . WBC 05/15/2016 9.7  4.0 - 10.5 K/uL Final  . RBC 05/15/2016 4.28  3.87 - 5.11 Mil/uL Final  . Platelets 05/15/2016 278.0  150.0 - 400.0 K/uL Final  . Hemoglobin 05/15/2016 10.2* 12.0 - 15.0 g/dL Final  . HCT 05/15/2016 32.6* 36.0 - 46.0 % Final  . MCV 05/15/2016 76.1* 78.0 - 100.0 fl Final  . MCHC 05/15/2016 31.3  30.0 - 36.0 g/dL Final  . RDW 05/15/2016 15.1  11.5 - 15.5 % Final  . Cholesterol 05/15/2016 128  0 - 200 mg/dL Final  . Triglycerides 05/15/2016 98.0  0.0 - 149.0 mg/dL Final  . HDL 05/15/2016 41.80  >39.00 mg/dL Final  . VLDL 05/15/2016 19.6  0.0 - 40.0 mg/dL Final  . LDL Cholesterol 05/15/2016 66  0 - 99 mg/dL Final  . Total CHOL/HDL Ratio 05/15/2016 3   Final  . NonHDL 05/15/2016 86.05   Final

## 2016-05-15 NOTE — Patient Instructions (Signed)
Reduce Lantus to 12 in ams, no other change

## 2016-05-16 LAB — FRUCTOSAMINE: Fructosamine: 256 umol/L (ref 0–285)

## 2016-06-26 ENCOUNTER — Encounter: Payer: Self-pay | Admitting: Podiatry

## 2016-06-26 ENCOUNTER — Ambulatory Visit (INDEPENDENT_AMBULATORY_CARE_PROVIDER_SITE_OTHER): Payer: Medicare Other | Admitting: Podiatry

## 2016-06-26 VITALS — BP 115/58 | HR 87 | Resp 18

## 2016-06-26 DIAGNOSIS — B351 Tinea unguium: Secondary | ICD-10-CM

## 2016-06-26 DIAGNOSIS — E1142 Type 2 diabetes mellitus with diabetic polyneuropathy: Secondary | ICD-10-CM | POA: Diagnosis not present

## 2016-06-26 DIAGNOSIS — M79676 Pain in unspecified toe(s): Secondary | ICD-10-CM

## 2016-06-26 NOTE — Patient Instructions (Signed)

## 2016-06-27 NOTE — Progress Notes (Signed)
Patient ID: Allison Rodgers, female   DOB: [birth year], 82 y.o.   MRN: [id number]    Subjective: This patient presents again for scheduled visit complaining of elongated and thickened toenails which are uncomfortable walking wearing shoes and requests toenail debridement  Assessment: Orientated 3 DP and PT pulses 2/4 bilaterally Capillary reflex immediate bilaterally Sensation to 10 g monofilament wire intact 1/5 right 2/5 left Vibratory sensation reactive right nonreactive left Ankle reflexes reactive bilaterally Pes planus bilaterally Patient walks slowly with assistance of a walker No open skin lesions bilaterally The toenails are hypertrophic, elongated, discolored, incurvated and tender to direct palpation 6-10  Assessment: Symptomatic onychomycoses 6-10 Satisfactory vascular status Diabetic peripheral neuropathy  Plan: Debridement of toenails 6-10 mechanically and electronically without any bleeding  Reappoint 3 month

## 2016-08-15 ENCOUNTER — Ambulatory Visit (INDEPENDENT_AMBULATORY_CARE_PROVIDER_SITE_OTHER): Payer: Medicare Other | Admitting: Endocrinology

## 2016-08-15 ENCOUNTER — Encounter: Payer: Self-pay | Admitting: Endocrinology

## 2016-08-15 VITALS — BP 116/60 | HR 68 | Ht 60.24 in | Wt 150.4 lb

## 2016-08-15 DIAGNOSIS — N184 Chronic kidney disease, stage 4 (severe): Secondary | ICD-10-CM | POA: Diagnosis not present

## 2016-08-15 DIAGNOSIS — R634 Abnormal weight loss: Secondary | ICD-10-CM | POA: Diagnosis not present

## 2016-08-15 DIAGNOSIS — Z794 Long term (current) use of insulin: Secondary | ICD-10-CM

## 2016-08-15 DIAGNOSIS — D509 Iron deficiency anemia, unspecified: Secondary | ICD-10-CM

## 2016-08-15 DIAGNOSIS — E1165 Type 2 diabetes mellitus with hyperglycemia: Secondary | ICD-10-CM | POA: Diagnosis not present

## 2016-08-15 LAB — COMPREHENSIVE METABOLIC PANEL
ALBUMIN: 3.7 g/dL (ref 3.5–5.2)
ALK PHOS: 70 U/L (ref 39–117)
ALT: 7 U/L (ref 0–35)
AST: 12 U/L (ref 0–37)
BILIRUBIN TOTAL: 0.4 mg/dL (ref 0.2–1.2)
BUN: 25 mg/dL — AB (ref 6–23)
CALCIUM: 9.9 mg/dL (ref 8.4–10.5)
CHLORIDE: 107 meq/L (ref 96–112)
CO2: 29 mEq/L (ref 19–32)
CREATININE: 1.76 mg/dL — AB (ref 0.40–1.20)
GFR: 34.81 mL/min — ABNORMAL LOW (ref >60.00)
Glucose, Bld: 146 mg/dL — ABNORMAL HIGH (ref 70–99)
Potassium: 4.7 mEq/L (ref 3.5–5.1)
SODIUM: 139 meq/L (ref 135–145)
TOTAL PROTEIN: 8.2 g/dL (ref 6.0–8.3)

## 2016-08-15 LAB — CBC WITH DIFFERENTIAL/PLATELET
BASOS ABS: 0 10*3/uL (ref 0.0–0.1)
BASOS PCT: 0.3 % (ref 0.0–3.0)
EOS ABS: 0.1 10*3/uL (ref 0.0–0.7)
Eosinophils Relative: 0.6 % (ref 0.0–5.0)
HEMATOCRIT: 34.5 % — AB (ref 36.0–46.0)
HEMOGLOBIN: 10.8 g/dL — AB (ref 12.0–15.0)
LYMPHS PCT: 9 % — AB (ref 12.0–46.0)
Lymphs Abs: 0.9 10*3/uL (ref 0.7–4.0)
MCHC: 31.4 g/dL (ref 30.0–36.0)
MCV: 75.5 fl — ABNORMAL LOW (ref 78.0–100.0)
MONO ABS: 0.6 10*3/uL (ref 0.1–1.0)
Monocytes Relative: 5.8 % (ref 3.0–12.0)
Neutro Abs: 8.5 10*3/uL — ABNORMAL HIGH (ref 1.4–7.7)
Neutrophils Relative %: 84.3 % — ABNORMAL HIGH (ref 43.0–77.0)
Platelets: 313 10*3/uL (ref 150.0–400.0)
RBC: 4.57 Mil/uL (ref 3.87–5.11)
RDW: 14.6 % (ref 11.5–15.5)
WBC: 10 10*3/uL (ref 4.0–10.5)

## 2016-08-15 LAB — IBC PANEL
IRON: 24 ug/dL — AB (ref 42–145)
SATURATION RATIOS: 8.5 % — AB (ref 20.0–50.0)
TRANSFERRIN: 202 mg/dL — AB (ref 212.0–360.0)

## 2016-08-15 LAB — TSH: TSH: 1.47 u[IU]/mL (ref 0.35–4.50)

## 2016-08-15 LAB — POCT GLYCOSYLATED HEMOGLOBIN (HGB A1C): Hemoglobin A1C: 5.5

## 2016-08-15 NOTE — Patient Instructions (Signed)
Reduce pm Lantus to 6 units

## 2016-08-15 NOTE — Progress Notes (Signed)
Patient ID: Allison Rodgers, female   DOB: 1924/04/30, 83 y.o.   MRN: [id number]   Reason for Appointment : Followup for Type 2 Diabetes  History of Present llness          Diagnosis: Type 2 diabetes mellitus, date of diagnosis: ? 2007       Past history:  Apparently she had blood sugars over 500 at the time of diagnosis She was started on insulin at the time of diagnosis and has been continued on insulin with various regimens Also her weight had been previously significantly more, about 200 pounds Her blood sugars  were higher in late December/early January especially after meals, with highest 356.  With this she was started on NovoLog 5 units before meals at the end of January  Recent history:   INSULIN regimen: Lantus 12 units a.m. and 8 units p.m..  NovoLog 3 before breakfast--3 units before dinner  She has been on LANTUS twice a day along with small doses of Novolog at breakfast and supper Because of occasional low normal readings at lunch and supper morning Lantus was reduced by 2 units on the last visit  Blood sugars by review of the her nursing home records show some fluctuation as usual but monitoring is being done mostly at breakfast and lunchtime Has only mildly increased blood sugars at times and No hypoglycemia She has only a few readings at suppertime and is only high about half the time Has checked only a couple of times after supper in the last couple of months  Continues to lose some weight She says that sometimes she will have regurgitation or vomiting  Hemoglobin A1c: This tends to be lower than expected probably because of her renal insufficiency  Glucose monitoring:  done 2-3 times  a day.   Glucometer: ?     Blood Glucose readings from nursing home record recently:  Mean values apply above for all meters except median for One Touch  PRE-MEAL Fasting Lunch Dinner Bedtime Overall  Glucose range: 102-250 106-193  117-333  190    Mean/median:            Hypoglycemia : None       Meals: 3 meals per day.  breakfast:  Egg, toast, juice and sausage  Physical activity: exercise: Trying to walk in her nursing home But overall not very active Dietician consultations: Several years ago            Wt Readings from Last 3 Encounters:  08/15/16 150 lb 6.4 oz (68.2 kg)  05/15/16 161 lb (73 kg)  02/14/16 176 lb (79.8 kg)   LABS:  Lab Results  Component Value Date   HGBA1C 5.2 05/15/2016   HGBA1C 5.5 02/14/2016   HGBA1C 6.0 11/15/2015   Lab Results  Component Value Date   MICROALBUR <0.7 11/15/2015   LDLCALC 66 05/15/2016   CREATININE 1.74 (H) 02/14/2016   Lab Results  Component Value Date   FRUCTOSAMINE 256 05/15/2016   FRUCTOSAMINE 296 (H) 11/15/2015   FRUCTOSAMINE 251 08/16/2013     Allergies as of 08/15/2016      Reactions   Penicillins Other (See Comments)   REACTION: causes hallucinations   Sulfamethoxazole-trimethoprim Swelling   REACTION: rash   Bactrim    Unknown per mar   Trimethoprim    Unknown per mar      Medication List       Accurate as of 08/15/16  3:03 PM. Always use your most recent med  list.          acetaminophen 500 MG tablet Commonly known as:  TYLENOL Take 1 tablet (500 mg total) by mouth every 6 (six) hours as needed for moderate pain.   ALIGN 4 MG Caps Take 1 tablet by mouth daily.   aspirin 81 MG chewable tablet Chew 81 mg by mouth daily.   Cranberry 200 MG Caps Take 2 capsules by mouth 2 (two) times daily.   dorzolamide 2 % ophthalmic solution Commonly known as:  TRUSOPT Place 1 drop into both eyes at bedtime.   feeding supplement (GLUCERNA SHAKE) Liqd Take 237 mLs by mouth 3 (three) times daily between meals.   fluocinonide cream 0.05 % Commonly known as:  LIDEX Apply 1 application topically 2 (two) times daily as needed.   FLUZONE HIGH-DOSE 0.5 ML Susy Generic drug:  Influenza Vac Split High-Dose Reported on 02/14/2016   furosemide 20 MG tablet Commonly known as:   LASIX Take 1 tablet (20 mg total) by mouth daily.   guaifenesin 100 MG/5ML syrup Commonly known as:  ROBITUSSIN Take 200 mg by mouth as needed for cough. Reported on 02/14/2016   guaiFENesin-dextromethorphan 100-10 MG/5ML syrup Commonly known as:  ROBITUSSIN DM Take 5 mLs by mouth 3 (three) times daily as needed for cough. Reported on 02/14/2016   insulin glargine 100 UNIT/ML injection Commonly known as:  LANTUS Inject 8-14 Units into the skin 2 (two) times daily. Inject 14 units in am and 8 units in pm   insulin lispro 100 UNIT/ML injection Commonly known as:  HUMALOG Inject 3 units before breakfast and 3 units before dinner.   lisinopril 5 MG tablet Commonly known as:  PRINIVIL,ZESTRIL Reported on 02/14/2016   loperamide 2 MG capsule Commonly known as:  IMODIUM Take by mouth as needed for diarrhea or loose stools.   NOVOLOG 100 UNIT/ML injection Generic drug:  insulin aspart Inject 3 Units into the skin 2 (two) times daily. Reported on 02/14/2016   polyethylene glycol powder powder Commonly known as:  GLYCOLAX/MIRALAX   ranitidine 150 MG capsule Commonly known as:  ZANTAC   sodium chloride 0.65 % Soln nasal spray Commonly known as:  OCEAN Place 2 sprays into both nostrils as needed for congestion. Reported on 02/14/2016   sorbitol 70 % solution Take 15 mLs by mouth daily as needed.   spironolactone 25 MG tablet Commonly known as:  ALDACTONE Take 25 mg by mouth 2 (two) times daily.   XALATAN 0.005 % ophthalmic solution Generic drug:  latanoprost 1 drop at bedtime.       Allergies:  Allergies  Allergen Reactions  . Penicillins Other (See Comments)    REACTION: causes hallucinations  . Sulfamethoxazole-Trimethoprim Swelling    REACTION: rash  . Bactrim     Unknown per mar  . Trimethoprim     Unknown per mar     Past Medical History:  Diagnosis Date  . Arthritis   . Bacterial meningitis   . Benign neoplasm of other and unspecified site of the digestive  system   . Chronic abdominal pain   . Diverticulosis   . DJD (degenerative joint disease)   . DM (diabetes mellitus) ([place])   . Dyslipidemia   . Edema   . Esophageal stricture   . Gastritis, chronic   . GERD (gastroesophageal reflux disease)   . Hiatal hernia   . Hx of adenomatous colonic polyps   . Hypertension   . Hypertensive cardiovascular disease   . Microcytic anemia   .  Obesity   . Renal disorder     Past Surgical History:  Procedure Laterality Date  . APPENDECTOMY    . BACK SURGERY    . BILATERAL OOPHORECTOMY  1957  . CHOLECYSTECTOMY  1999  . HERNIA REPAIR  [id number]   umbilical and ventral hernia repair by Dr. Margot Chimes  . ROTATOR CUFF REPAIR  7/05   right  . VESICOVAGINAL FISTULA CLOSURE W/ TAH  1957    Family History  Problem Relation Age of Onset  . Stroke Mother   . Cancer Father   . Hypertension Other     Social History:  reports that she has never smoked. She has never used smokeless tobacco. She reports that she does not drink alcohol or use drugs.    Review of Systems     Lipids: She is  not on any Statin drugs And usually lipids are fairly good  Lab Results  Component Value Date   CHOL 128 05/15/2016   HDL 41.80 05/15/2016   LDLCALC 66 05/15/2016   TRIG 98.0 05/15/2016   CHOLHDL 3 05/15/2016    RENAL insufficiency: She does not see a nephrologist    She is taking diuretics including spironolactone.   Lab Results  Component Value Date   CREATININE 1.74 (H) 02/14/2016   BUN 28 (H) 02/14/2016   NA 140 02/14/2016   K 4.2 02/14/2016   CL 109 02/14/2016   CO2 28 02/14/2016     Last diabetic foot exam was done in 7/16  Physical Examination:  BP 116/60   Pulse 68   Ht 5' 0.24" (1.53 m)   Wt 150 lb 6.4 oz (68.2 kg)   SpO2 94%   BMI 29.14 kg/m      ASSESSMENT:  Diabetes type 2 On insulin   See history of present illness for detailed discussion of his current management, blood sugar patterns and problems  identified  Considering her age her blood sugars are well controlled Her monitoring at the nursing home has been erratic and mostly at breakfast and lunch with some variability  She is continuing to lose weight and not clear why Taking only small doses of mealtime coverage at breakfast and suppertime, since blood sugars are not always high before supper no lunchtime coverage has been given  PLAN:   Reduce evening Lantus by 2 units as a precaution against nocturnal hypoglycemia, most likely is requiring less insulin because of her weight loss Follow-up labs today for chemistry and anemia, will also check iron level She may need nephrology consultation or hematology consultation, will defer to PCP  Continue monitoring blood sugars as before at the nursing home   [facility] 08/15/2016, 3:03 PM

## 2016-08-21 NOTE — Progress Notes (Signed)
Please fax to nursing home

## 2016-09-18 ENCOUNTER — Emergency Department (HOSPITAL_COMMUNITY): Payer: Medicare Other

## 2016-09-18 ENCOUNTER — Inpatient Hospital Stay (HOSPITAL_COMMUNITY)
Admission: EM | Admit: 2016-09-18 | Discharge: 2016-09-22 | DRG: 392 | Disposition: A | Discharge status: Skilled Nursing Facility | Payer: Medicare Other | Attending: Internal Medicine | Admitting: Internal Medicine

## 2016-09-18 ENCOUNTER — Encounter (HOSPITAL_COMMUNITY): Payer: Self-pay

## 2016-09-18 DIAGNOSIS — Z794 Long term (current) use of insulin: Secondary | ICD-10-CM

## 2016-09-18 DIAGNOSIS — E860 Dehydration: Secondary | ICD-10-CM | POA: Diagnosis present

## 2016-09-18 DIAGNOSIS — I129 Hypertensive chronic kidney disease with stage 1 through stage 4 chronic kidney disease, or unspecified chronic kidney disease: Secondary | ICD-10-CM | POA: Diagnosis present

## 2016-09-18 DIAGNOSIS — Z880 Allergy status to penicillin: Secondary | ICD-10-CM

## 2016-09-18 DIAGNOSIS — D509 Iron deficiency anemia, unspecified: Secondary | ICD-10-CM | POA: Diagnosis present

## 2016-09-18 DIAGNOSIS — E1122 Type 2 diabetes mellitus with diabetic chronic kidney disease: Secondary | ICD-10-CM | POA: Diagnosis present

## 2016-09-18 DIAGNOSIS — E11649 Type 2 diabetes mellitus with hypoglycemia without coma: Secondary | ICD-10-CM | POA: Diagnosis present

## 2016-09-18 DIAGNOSIS — K5732 Diverticulitis of large intestine without perforation or abscess without bleeding: Secondary | ICD-10-CM | POA: Diagnosis not present

## 2016-09-18 DIAGNOSIS — E785 Hyperlipidemia, unspecified: Secondary | ICD-10-CM | POA: Diagnosis present

## 2016-09-18 DIAGNOSIS — Z66 Do not resuscitate: Secondary | ICD-10-CM | POA: Diagnosis not present

## 2016-09-18 DIAGNOSIS — N184 Chronic kidney disease, stage 4 (severe): Secondary | ICD-10-CM | POA: Diagnosis present

## 2016-09-18 DIAGNOSIS — E119 Type 2 diabetes mellitus without complications: Secondary | ICD-10-CM | POA: Diagnosis not present

## 2016-09-18 DIAGNOSIS — Z9049 Acquired absence of other specified parts of digestive tract: Secondary | ICD-10-CM

## 2016-09-18 DIAGNOSIS — N183 Chronic kidney disease, stage 3 (moderate): Secondary | ICD-10-CM | POA: Diagnosis present

## 2016-09-18 DIAGNOSIS — K219 Gastro-esophageal reflux disease without esophagitis: Secondary | ICD-10-CM | POA: Diagnosis not present

## 2016-09-18 DIAGNOSIS — K5792 Diverticulitis of intestine, part unspecified, without perforation or abscess without bleeding: Secondary | ICD-10-CM | POA: Diagnosis present

## 2016-09-18 DIAGNOSIS — Z882 Allergy status to sulfonamides status: Secondary | ICD-10-CM

## 2016-09-18 DIAGNOSIS — R112 Nausea with vomiting, unspecified: Secondary | ICD-10-CM | POA: Diagnosis present

## 2016-09-18 DIAGNOSIS — Z8249 Family history of ischemic heart disease and other diseases of the circulatory system: Secondary | ICD-10-CM

## 2016-09-18 DIAGNOSIS — Z8661 Personal history of infections of the central nervous system: Secondary | ICD-10-CM

## 2016-09-18 DIAGNOSIS — T501X5A Adverse effect of loop [high-ceiling] diuretics, initial encounter: Secondary | ICD-10-CM | POA: Diagnosis not present

## 2016-09-18 DIAGNOSIS — E46 Unspecified protein-calorie malnutrition: Secondary | ICD-10-CM

## 2016-09-18 DIAGNOSIS — D529 Folate deficiency anemia, unspecified: Secondary | ICD-10-CM | POA: Diagnosis present

## 2016-09-18 DIAGNOSIS — Z86010 Personal history of colonic polyps: Secondary | ICD-10-CM

## 2016-09-18 DIAGNOSIS — M1990 Unspecified osteoarthritis, unspecified site: Secondary | ICD-10-CM | POA: Diagnosis present

## 2016-09-18 DIAGNOSIS — Z79899 Other long term (current) drug therapy: Secondary | ICD-10-CM

## 2016-09-18 DIAGNOSIS — E43 Unspecified severe protein-calorie malnutrition: Secondary | ICD-10-CM | POA: Insufficient documentation

## 2016-09-18 DIAGNOSIS — Z7982 Long term (current) use of aspirin: Secondary | ICD-10-CM

## 2016-09-18 DIAGNOSIS — Z6823 Body mass index (BMI) 23.0-23.9, adult: Secondary | ICD-10-CM

## 2016-09-18 DIAGNOSIS — Z881 Allergy status to other antibiotic agents status: Secondary | ICD-10-CM

## 2016-09-18 DIAGNOSIS — N179 Acute kidney failure, unspecified: Secondary | ICD-10-CM | POA: Diagnosis present

## 2016-09-18 DIAGNOSIS — E440 Moderate protein-calorie malnutrition: Secondary | ICD-10-CM | POA: Diagnosis present

## 2016-09-18 DIAGNOSIS — I10 Essential (primary) hypertension: Secondary | ICD-10-CM

## 2016-09-18 LAB — URINALYSIS, ROUTINE W REFLEX MICROSCOPIC
Bilirubin Urine: NEGATIVE
Glucose, UA: NEGATIVE mg/dL
HGB URINE DIPSTICK: NEGATIVE
Ketones, ur: 5 mg/dL — AB
Leukocytes, UA: NEGATIVE
Nitrite: NEGATIVE
Protein, ur: NEGATIVE mg/dL
SPECIFIC GRAVITY, URINE: 1.014 (ref 1.005–1.030)
pH: 5 (ref 5.0–8.0)

## 2016-09-18 LAB — CBC WITH DIFFERENTIAL/PLATELET
BASOS PCT: 0 %
Basophils Absolute: 0 10*3/uL (ref 0.0–0.1)
EOS PCT: 0 %
Eosinophils Absolute: 0 10*3/uL (ref 0.0–0.7)
HCT: 31.9 % — ABNORMAL LOW (ref 36.0–46.0)
HEMOGLOBIN: 10 g/dL — AB (ref 12.0–15.0)
LYMPHS PCT: 2 %
Lymphs Abs: 0.3 10*3/uL — ABNORMAL LOW (ref 0.7–4.0)
MCH: 23.3 pg — AB (ref 26.0–34.0)
MCHC: 31.3 g/dL (ref 30.0–36.0)
MCV: 74.4 fL — AB (ref 78.0–100.0)
Monocytes Absolute: 1.1 10*3/uL — ABNORMAL HIGH (ref 0.1–1.0)
Monocytes Relative: 7 %
NEUTROS PCT: 91 %
Neutro Abs: 14.6 10*3/uL — ABNORMAL HIGH (ref 1.7–7.7)
Platelets: 357 10*3/uL (ref 150–400)
RBC: 4.29 MIL/uL (ref 3.87–5.11)
RDW: 15 % (ref 11.5–15.5)
WBC: 16 10*3/uL — ABNORMAL HIGH (ref 4.0–10.5)

## 2016-09-18 LAB — MRSA PCR SCREENING: MRSA by PCR: NEGATIVE

## 2016-09-18 LAB — I-STAT CG4 LACTIC ACID, ED: LACTIC ACID, VENOUS: 1.16 mmol/L (ref 0.5–1.9)

## 2016-09-18 LAB — COMPREHENSIVE METABOLIC PANEL
ALT: 9 U/L — AB (ref 14–54)
ANION GAP: 11 (ref 5–15)
AST: 15 U/L (ref 15–41)
Albumin: 3.1 g/dL — ABNORMAL LOW (ref 3.5–5.0)
Alkaline Phosphatase: 60 U/L (ref 38–126)
BUN: 47 mg/dL — ABNORMAL HIGH (ref 6–20)
CO2: 20 mmol/L — AB (ref 22–32)
Calcium: 9.6 mg/dL (ref 8.9–10.3)
Chloride: 107 mmol/L (ref 101–111)
Creatinine, Ser: 2.51 mg/dL — ABNORMAL HIGH (ref 0.44–1.00)
GFR calc non Af Amer: 16 mL/min — ABNORMAL LOW (ref >60)
GFR, EST AFRICAN AMERICAN: 18 mL/min — AB (ref >60)
Glucose, Bld: 89 mg/dL (ref 65–99)
Potassium: 4.3 mmol/L (ref 3.5–5.1)
SODIUM: 138 mmol/L (ref 135–145)
Total Bilirubin: 1.1 mg/dL (ref 0.3–1.2)
Total Protein: 7.8 g/dL (ref 6.5–8.1)

## 2016-09-18 LAB — C DIFFICILE QUICK SCREEN W PCR REFLEX
C DIFFICILE (CDIFF) INTERP: NOT DETECTED
C DIFFICILE (CDIFF) TOXIN: NEGATIVE
C DIFFICLE (CDIFF) ANTIGEN: NEGATIVE

## 2016-09-18 LAB — CREATININE, SERUM
Creatinine, Ser: 2.04 mg/dL — ABNORMAL HIGH (ref 0.44–1.00)
GFR calc Af Amer: 23 mL/min — ABNORMAL LOW (ref >60)
GFR, EST NON AFRICAN AMERICAN: 20 mL/min — AB (ref >60)

## 2016-09-18 LAB — CBC
HCT: 29.7 % — ABNORMAL LOW (ref 36.0–46.0)
Hemoglobin: 9.3 g/dL — ABNORMAL LOW (ref 12.0–15.0)
MCH: 23.3 pg — ABNORMAL LOW (ref 26.0–34.0)
MCHC: 31.3 g/dL (ref 30.0–36.0)
MCV: 74.3 fL — ABNORMAL LOW (ref 78.0–100.0)
Platelets: 315 10*3/uL (ref 150–400)
RBC: 4 MIL/uL (ref 3.87–5.11)
RDW: 15 % (ref 11.5–15.5)
WBC: 17.6 10*3/uL — ABNORMAL HIGH (ref 4.0–10.5)

## 2016-09-18 LAB — LIPASE, BLOOD: LIPASE: 14 U/L (ref 11–51)

## 2016-09-18 LAB — GLUCOSE, CAPILLARY: Glucose-Capillary: 86 mg/dL (ref 65–99)

## 2016-09-18 MED ORDER — CIPROFLOXACIN IN D5W 400 MG/200ML IV SOLN
400.0000 mg | INTRAVENOUS | Status: DC
  Administered 2016-09-19 – 2016-09-22 (×4): 400 mg via INTRAVENOUS
  Filled 2016-09-18 (×4): qty 200

## 2016-09-18 MED ORDER — ACETAMINOPHEN 325 MG PO TABS
650.0000 mg | ORAL_TABLET | Freq: Once | ORAL | Status: AC
  Administered 2016-09-18: 650 mg via ORAL
  Filled 2016-09-18: qty 2

## 2016-09-18 MED ORDER — INSULIN ASPART 100 UNIT/ML ~~LOC~~ SOLN: 0.0000 [IU] | Freq: Three times a day (TID) | SUBCUTANEOUS | Status: DC

## 2016-09-18 MED ORDER — SODIUM CHLORIDE 0.9 % IV SOLN
INTRAVENOUS | Status: DC
  Administered 2016-09-18 – 2016-09-19 (×2): via INTRAVENOUS

## 2016-09-18 MED ORDER — CIPROFLOXACIN IN D5W 400 MG/200ML IV SOLN
400.0000 mg | Freq: Once | INTRAVENOUS | Status: AC
  Administered 2016-09-18: 400 mg via INTRAVENOUS
  Filled 2016-09-18: qty 200

## 2016-09-18 MED ORDER — METRONIDAZOLE IN NACL 5-0.79 MG/ML-% IV SOLN
500.0000 mg | Freq: Three times a day (TID) | INTRAVENOUS | Status: DC
  Administered 2016-09-18 – 2016-09-22 (×12): 500 mg via INTRAVENOUS
  Filled 2016-09-18 (×12): qty 100

## 2016-09-18 MED ORDER — METRONIDAZOLE IN NACL 5-0.79 MG/ML-% IV SOLN
500.0000 mg | Freq: Once | INTRAVENOUS | Status: AC
  Administered 2016-09-18: 500 mg via INTRAVENOUS
  Filled 2016-09-18: qty 100

## 2016-09-18 MED ORDER — MORPHINE SULFATE (PF) 2 MG/ML IV SOLN: 1.0000 mg | INTRAVENOUS | Status: DC | PRN

## 2016-09-18 MED ORDER — ONDANSETRON HCL 4 MG PO TABS
4.0000 mg | ORAL_TABLET | Freq: Four times a day (QID) | ORAL | Status: DC | PRN
  Administered 2016-09-20: 4 mg via ORAL
  Filled 2016-09-18: qty 1

## 2016-09-18 MED ORDER — LATANOPROST 0.005 % OP SOLN
1.0000 [drp] | Freq: Every day | OPHTHALMIC | Status: DC
  Administered 2016-09-18 – 2016-09-21 (×4): 1 [drp] via OPHTHALMIC
  Filled 2016-09-18: qty 2.5

## 2016-09-18 MED ORDER — FUROSEMIDE 20 MG PO TABS: 10.0000 mg | ORAL_TABLET | Freq: Every day | ORAL | Status: DC

## 2016-09-18 MED ORDER — ONDANSETRON HCL 4 MG/2ML IJ SOLN
4.0000 mg | Freq: Four times a day (QID) | INTRAMUSCULAR | Status: DC | PRN
  Administered 2016-09-18: 4 mg via INTRAVENOUS
  Filled 2016-09-18: qty 2

## 2016-09-18 MED ORDER — HEPARIN SODIUM (PORCINE) 5000 UNIT/ML IJ SOLN
5000.0000 [IU] | Freq: Three times a day (TID) | INTRAMUSCULAR | Status: DC
  Administered 2016-09-18: 5000 [IU] via SUBCUTANEOUS
  Filled 2016-09-18 (×2): qty 1

## 2016-09-18 MED ORDER — SPIRONOLACTONE 25 MG PO TABS: 25.0000 mg | ORAL_TABLET | Freq: Two times a day (BID) | ORAL | Status: DC

## 2016-09-18 MED ORDER — SODIUM CHLORIDE 0.9 % IV BOLUS (SEPSIS)
1000.0000 mL | Freq: Once | INTRAVENOUS | Status: AC
  Administered 2016-09-18: 1000 mL via INTRAVENOUS

## 2016-09-18 MED ORDER — FAMOTIDINE 20 MG PO TABS
20.0000 mg | ORAL_TABLET | Freq: Two times a day (BID) | ORAL | Status: DC
  Administered 2016-09-18 – 2016-09-20 (×3): 20 mg via ORAL
  Filled 2016-09-18 (×4): qty 1

## 2016-09-18 MED ORDER — ASPIRIN 81 MG PO CHEW
81.0000 mg | CHEWABLE_TABLET | Freq: Every day | ORAL | Status: DC
  Administered 2016-09-18 – 2016-09-22 (×5): 81 mg via ORAL
  Filled 2016-09-18 (×6): qty 1

## 2016-09-18 MED ORDER — DORZOLAMIDE HCL 2 % OP SOLN
1.0000 [drp] | Freq: Three times a day (TID) | OPHTHALMIC | Status: DC
  Administered 2016-09-18 – 2016-09-22 (×12): 1 [drp] via OPHTHALMIC
  Filled 2016-09-18: qty 10

## 2016-09-18 MED ORDER — IOPAMIDOL (ISOVUE-300) INJECTION 61%
INTRAVENOUS | Status: AC
  Administered 2016-09-18: 30 mL via ORAL
  Filled 2016-09-18: qty 30

## 2016-09-18 MED ORDER — ACETAMINOPHEN 500 MG PO TABS: 500.0000 mg | ORAL_TABLET | Freq: Four times a day (QID) | ORAL | Status: DC | PRN

## 2016-09-18 MED ORDER — METRONIDAZOLE IN NACL 5-0.79 MG/ML-% IV SOLN: 500.0000 mg | Freq: Three times a day (TID) | INTRAVENOUS | Status: DC

## 2016-09-18 NOTE — Progress Notes (Signed)
Pharmacy Antibiotic Note  Allison Rodgers is a 83 y.o. female with PMH DM, HTN, anemia admitted on 09/18/2016 with diverticulitis. Pharmacy has been consulted for ciprofloxacin dosing. Acute on chronic kidney disease noted.  Plan:  Ciprofloxacin 400 mg IV q24 hr  Flagyl per MD     Temp (24hrs), Avg:99.8 F (37.7 C), Min:98.4 F (36.9 C), Max:101.2 F (38.4 C)   Recent Labs Lab 09/18/16 1017 09/18/16 1020  WBC 16.0*  --   CREATININE 2.51*  --   LATICACIDVEN  --  1.16    CrCl cannot be calculated (Unknown ideal weight.).    Allergies  Allergen Reactions  . Penicillins Other (See Comments)    REACTION: causes hallucinations  . Sulfamethoxazole-Trimethoprim Swelling    REACTION: rash  . Bactrim     Unknown per mar  . Trimethoprim     Unknown per mar     Antimicrobials this admission: 2/7 Cipro >>  2/7 Flagyl >>   Dose adjustments this admission: ---  Microbiology results: 2/7 MRSA PCR: sent 2/7 Cdiff: sent  Thank you for allowing pharmacy to be a part of this patient's care.  Reuel Boom, PharmD, BCPS Pager: [phone] 09/18/2016, 7:43 PM

## 2016-09-18 NOTE — ED Notes (Signed)
Bed: WA15 Expected date:  Expected time:  Means of arrival:  Comments: RES B 

## 2016-09-18 NOTE — Clinical Social Work Note (Signed)
Clinical Social Work Assessment  Patient Details  Name: Allison Rodgers MRN: [id number] Date of Birth: [birth year]  Date of referral:  09/18/16               Reason for consult:  Facility Placement                Permission sought to share information with:  Facility Art therapist granted to share information::  Yes, Verbal Permission Granted  Name::        Agency::     Relationship::     Contact Information:     Housing/Transportation Living arrangements for the past 2 months:  (S)  ([place]) Source of Information:  Adult Children Patient Interpreter Needed:  None Criminal Activity/Legal Involvement Pertinent to Current Situation/Hospitalization:  No - Comment as needed Significant Relationships:  Adult Children Lives with:  Facility Resident Do you feel safe going back to the place where you live?  Yes Need for family participation in patient care:  Yes (Comment)  Care giving concerns:  None given by family   Social Worker assessment / plan:  CSW met with pt and pt's son and daughter-in-law and confirmed their plan to be discharged to [facility] care to live at discharge.  Pt has been living at [facility] since November 2014, prior to being admitted.  Pt's family stated they prefer to drive the pt back to [place] at discharge, rather than using an ambulance.  Employment status:  Retired Nurse, adult PT Recommendations:  Not assessed at this time Information / Referral to community resources:     Patient/Family's Response to care:  Pt demonstrated fluctuating orientation.  Patient and pt's family agreeable to plan.  Pt's son and daughter-in-law supportive and strongly involved in pt.'s care.  Pt.'s family pleasant and appreciated CSW intervention.     Patient/Family's Understanding of and Emotional Response to Diagnosis, Current Treatment, and Prognosis:  Still assessing  Emotional  Assessment Appearance:  Appears younger than stated age Attitude/Demeanor/Rapport:    Affect (typically observed):  Accepting, Calm Orientation:  Fluctuating Orientation (Suspected and/or reported [place]) Alcohol / Substance use:    Psych involvement (Current and /or in the community):     Discharge Needs  Concerns to be addressed:  No discharge needs identified Readmission within the last 30 days:  No Current discharge risk:  None Barriers to Discharge:  No Barriers Identified   Claudine Mouton, LCSWA 09/18/2016, 4:38 PM

## 2016-09-18 NOTE — Progress Notes (Signed)
Aware that patient is here, we have been asked to consult regarding possible sigmoid mass. Will see patient tomorrow morning 09/19/16.  After discussion with Dr. Ardis Hughs, we will likely not pursue emergent sigmoidoscopy unless she does not follow typical course during treatment for her diverticulitis. This procedure will most likely be done in 5-6 w as an outpatient after acute symptoms have resolved and inflammation has had time to decrease.  Ellouise Newer, PA-C

## 2016-09-18 NOTE — H&P (Addendum)
History and Physical    Allison Rodgers [id number] DOB: [birth year]  DOA: 09/18/2016 PCP: Sande Brothers, MD  Patient coming from: SNF  Chief Complaint: Fever, abdominal pain and diarrhea   HPI: Allison Rodgers is a 83 y.o. female with medical history significant of DM type II HTN, Severe diverticulosis and GERD, presented to the ED after was found to be febrile and confuse at her SNF. Son report that her fever and confusion started today, T max 102 per family members, but has been dealing with gastrointestinal symptoms for the past 2 weeks.   Patient has been having persistent mild to moderate, watery diarrhea associated with nausea and vomiting. Also patient has decrease in oral intake with poor appetite. Denies blood stools or recent antibiotics use.   ED Course: CT abdomen show severe diverticulosis with signs of diverticulitis. AKI with Cr of 2.5 and elevated WBC 16.   Review of Systems:   General: Positive for fever, weight loss, poor appetite and general malaise  HEENT: no blurry vision, hearing changes or sore throat Respiratory: no dyspnea, coughing, wheezing CV: no chest pain, no palpitations GI: See HPI  GU: no dysuria, burning on urination, increased urinary frequency, hematuria  Ext:. No deformities,  Neuro: Positive for mild dementia. Denies unilateral weakness, numbness, or tingling, no vision change or hearing loss Skin: No rashes,  MSK: No muscle spasm, no deformity, no limitation of range of movement in spin Heme: No easy bruising.  Travel history: No recent long distant travel.   Past Medical History:  Diagnosis Date  . Arthritis   . Bacterial meningitis   . 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 Chronic abdominal pain   . Diverticulosis   . DJD (degenerative joint disease)   . DM (diabetes mellitus) ([place])   . Dyslipidemia   . Edema   . Esophageal stricture   . Gastritis, chronic   . GERD (gastroesophageal reflux  disease)   . Hiatal hernia   . Hx of adenomatous colonic polyps   . Hypertension   . Hypertensive cardiovascular disease   . Microcytic anemia   . Obesity   . Renal disorder     Past Surgical History:  Procedure Laterality Date  . APPENDECTOMY    . BACK SURGERY    . BILATERAL OOPHORECTOMY  1957  . CHOLECYSTECTOMY  1999  . HERNIA REPAIR  [id number]   umbilical and ventral hernia repair by Dr. Margot Chimes  . ROTATOR CUFF REPAIR  7/05   right  . VESICOVAGINAL FISTULA CLOSURE W/ TAH  1957     reports that she has never smoked. She has never used smokeless tobacco. She reports that she does not drink alcohol or use drugs.  Allergies  Allergen Reactions  . Penicillins Other (See Comments)    REACTION: causes hallucinations  . Sulfamethoxazole-Trimethoprim Swelling    REACTION: rash  . Bactrim     Unknown per mar  . Trimethoprim     Unknown per mar     Family History  Problem Relation Age of Onset  . Stroke Mother   . Cancer Father   . Hypertension Other     Family history reviewed and not pertinent   Prior to Admission medications   Medication Sig Start Date End Date Taking? Authorizing Provider  acetaminophen (TYLENOL) 500 MG tablet Take 1 tablet (500 mg total) by mouth every 6 (six) hours as needed for moderate pain. 02/11/15  Yes Tori Milks, MD  aspirin  81 MG chewable tablet Chew 81 mg by mouth daily.     Yes Historical Provider, MD  cholecalciferol (VITAMIN D) 400 units TABS tablet Take 800 Units by mouth daily.   Yes Historical Provider, MD  clotrimazole-betamethasone (LOTRISONE) cream Apply 1 application topically 2 (two) times daily.   Yes Historical Provider, MD  Cranberry 500 MG CAPS Take 500 mg by mouth 2 (two) times daily.   Yes Historical Provider, MD  dorzolamide (TRUSOPT) 2 % ophthalmic solution Place 1 drop into both eyes 3 (three) times daily.    Yes Historical Provider, MD  furosemide (LASIX) 20 MG tablet Take 1 tablet (20 mg total) by mouth daily. Patient  taking differently: Take 10 mg by mouth daily.  06/01/13  Yes Theodis Blaze, MD  guaifenesin (ROBITUSSIN) 100 MG/5ML syrup Take 200 mg by mouth every 4 (four) hours. Reported on 02/14/2016   Yes Historical Provider, MD  insulin glargine (LANTUS) 100 UNIT/ML injection Inject 6-12 Units into the skin 2 (two) times daily. Hold if fasting, 12 units in the morning and 6 units at night 06/07/13  Yes Elayne Snare, MD  insulin lispro (HUMALOG) 100 UNIT/ML injection Inject 3 units before breakfast and 3 units before dinner.   Yes Historical Provider, MD  latanoprost (XALATAN) 0.005 % ophthalmic solution Place 1 drop into both eyes at bedtime.    Yes Historical Provider, MD  lisinopril (PRINIVIL,ZESTRIL) 5 MG tablet Take 5 mg by mouth daily. Reported on 02/14/2016 10/24/15  Yes Historical Provider, MD  loperamide (IMODIUM) 2 MG capsule Take 2 mg by mouth daily as needed for diarrhea or loose stools.    Yes Historical Provider, MD  polyethylene glycol powder (GLYCOLAX/MIRALAX) powder Take 17 g by mouth 2 (two) times daily.  06/27/16  Yes Historical Provider, MD  Probiotic Product (ALIGN) 4 MG CAPS Take 4 mg by mouth daily.    Yes Historical Provider, MD  ranitidine (ZANTAC) 150 MG capsule Take 150 mg by mouth 2 (two) times daily.  11/01/15  Yes Historical Provider, MD  sodium chloride (OCEAN) 0.65 % SOLN nasal spray Place 2 sprays into both nostrils as needed for congestion. Reported on 02/14/2016   Yes Historical Provider, MD  sorbitol 70 % solution Take 15 mLs by mouth daily as needed (constipation).    Yes Historical Provider, MD  spironolactone (ALDACTONE) 25 MG tablet Take 25 mg by mouth 2 (two) times daily.   Yes Historical Provider, MD    Physical Exam: Vitals:   09/18/16 1245 09/18/16 1300 09/18/16 1330 09/18/16 1513  BP:  132/96 122/62 (!) 103/40  Pulse: 86 88  78  Resp:    18  Temp:    98.4 F (36.9 C)  TempSrc:    Oral  SpO2: 99% 100%  100%    Constitutional: NAD, calm, stool smell  Eyes:  PERRL ENMT: Mucous membranes are moist. Posterior pharynx clear  Neck: normal, supple, no masses, no thyromegaly Respiratory: clear to auscultation bilaterally, no wheezing, no crackles. Normal respiratory effort. No accessory muscle use.  Cardiovascular: Regular rate and rhythm, no murmurs / rubs / gallops. No extremity edema. 2+ pedal pulses. Abdomen: Abd soft, diffuse tenderness Left > right, no guarding. Positive bowel sounds.  Musculoskeletal: no clubbing / cyanosis. No joint deformity upper and lower extremities. Good ROM  Skin: no rashes, lesions, ulcers. No induration Neurologic: CN 2-12 grossly intact. Sensation intact. Strength 5/5 in all 4.  Psychiatric: Alert to person and place, but not to time. No focal deficits.  Labs on Admission: I have personally reviewed following labs and imaging studies  CBC:  Recent Labs Lab 09/18/16 1017  WBC 16.0*  NEUTROABS 14.6*  HGB 10.0*  HCT 31.9*  MCV 74.4*  PLT [id number]   Basic Metabolic Panel:  Recent Labs Lab 09/18/16 1017  NA 138  K 4.3  CL 107  CO2 20*  GLUCOSE 89  BUN 47*  CREATININE 2.51*  CALCIUM 9.6   Liver Function Tests:  Recent Labs Lab 09/18/16 1017  AST 15  ALT 9*  ALKPHOS 60  BILITOT 1.1  PROT 7.8  ALBUMIN 3.1*    Recent Labs Lab 09/18/16 1017  LIPASE 14   Urine analysis:    Component Value Date/Time   COLORURINE YELLOW 09/18/2016 1140   APPEARANCEUR CLEAR 09/18/2016 1140   LABSPEC 1.014 09/18/2016 1140   PHURINE 5.0 09/18/2016 1140   GLUCOSEU NEGATIVE 09/18/2016 1140   GLUCOSEU NEGATIVE 11/15/2013 1134   HGBUR NEGATIVE 09/18/2016 1140   BILIRUBINUR NEGATIVE 09/18/2016 1140   KETONESUR 5 (A) 09/18/2016 1140   PROTEINUR NEGATIVE 09/18/2016 1140   UROBILINOGEN 0.2 11/15/2013 1134   NITRITE NEGATIVE 09/18/2016 1140   LEUKOCYTESUR NEGATIVE 09/18/2016 1140    Radiological Exams on Admission: Ct Abdomen Pelvis Wo Contrast  Result Date: 09/18/2016 CLINICAL DATA:  Left lower quadrant  abdominal pain. EXAM: CT ABDOMEN AND PELVIS WITHOUT CONTRAST TECHNIQUE: Multidetector CT imaging of the abdomen and pelvis was performed following the standard protocol without IV contrast. COMPARISON:  CT scan of May 03, 2016. FINDINGS: Lower chest: 6 mm nodule is noted in in lingular segment of left upper lobe. Multiple other smaller nodules are noted in the same area. Hepatobiliary: No focal liver abnormality is seen. Status post cholecystectomy. No biliary dilatation. Pancreas: Unremarkable. No pancreatic ductal dilatation or surrounding inflammatory changes. Spleen: Normal in size without focal abnormality. Adrenals/Urinary Tract: Adrenal glands are unremarkable. Kidneys are normal, without renal calculi, focal lesion, or hydronephrosis. Bladder is unremarkable. Stomach/Bowel: There is no evidence of bowel obstruction. There is again noted diverticulosis of the sigmoid colon with surrounding inflammatory changes most consistent with sigmoid diverticulitis. However, possible mass cannot be excluded. Vascular/Lymphatic: Aortic atherosclerosis. No enlarged abdominal or pelvic lymph nodes. Reproductive: Status post hysterectomy. No adnexal masses. Other: No abdominal wall hernia or abnormality. No abdominopelvic ascites. Musculoskeletal: No acute or significant osseous findings. IMPRESSION: 6 mm nodule seen in left upper lobe. Multiple smaller nodules are noted in the adjacent area. Non-contrast chest CT at 3-6 months is recommended. If the nodules are stable at time of repeat CT, then future CT at 18-24 months (from today's scan) is considered optional for low-risk patients, but is recommended for high-risk patients. This recommendation follows the consensus statement: Guidelines for Management of Incidental Pulmonary Nodules Detected on CT Images: From the Fleischner Society 2017; Radiology 2017; 284:228-243. Diverticulosis of sigmoid colon is noted with wall thickening and surrounding inflammation most  consistent with sigmoid diverticulitis. However, the possibility of mass or neoplasm cannot be excluded given the amount of wall thickening present, and sigmoidoscopy is recommended for further evaluation. Electronically Signed   By: Marijo Conception, M.D.   On: 09/18/2016 14:20   Dg Chest 2 View  Result Date: 09/18/2016 CLINICAL DATA:  Fever EXAM: CHEST  2 VIEW COMPARISON:  02/11/2015 FINDINGS: Patchy nodular opacities are noted in the left upper lobe. This could reflect nodular infiltrate/pneumonia. Right lung is clear. Heart is normal size. No effusions or acute bony abnormality. IMPRESSION: Patchy nodular airspace opacities in the left  upper lobe, likely pneumonia. Followup PA and lateral chest X-ray is recommended in 3-4 weeks following trial of antibiotic therapy to ensure resolution and exclude underlying malignancy. Electronically Signed   By: Rolm Baptise M.D.   On: 09/18/2016 10:46    EKG: Ordered   Assessment/Plan Diverticulitis - Hx of severe diverticulosis last colonoscopy on 2009 - CT shows signs of inflammation consistent with sigmoid diverticulitis, elevated WBC and fever. Admit to Medsurg NPO Start IVF  Cipro/Flagyl  GI consulted - due to possible mass at the sigmoid colon, does need further evaluation at this time?  Check for c diff  Pain control PRN   Acute on CKD stage III - likely 2/2 dehydration from diarrhea, and continuation of diuretics  IVF - NS @ 75  Hold Lasix and aldactone  BMP in AM ordered  Avoid nephrotoxins   DM type II - well controlled last A1C 5.5 - seen by Endocrinology on 08/15/16 Hold home insuline given NPO status SSI  Monitor CBG's    HTN soft likely from dehydration  Monitor BP  Holding Lasix and Aldactone   Iron deficiency anemia - stable, no signs of overt bleeding  Monitor for now  Start iron supplement prior to discharge   DVT prophylaxis: Heparin  Code Status: DNR  Family Communication: Son at bedside - POA Disposition Plan:  Anticipate discharge to previous home environment.  Consults called: GI  Admission status: Inpatient Medsurg    Chipper Oman MD [facility] Pager [phone]  If 7PM-7AM, please contact night-coverage [email or web address] Password [email or web address]  09/18/2016, 4:16 PM

## 2016-09-18 NOTE — ED Provider Notes (Signed)
[facility] DEPT Provider Note   CSN: [id number] Arrival date & time: 09/18/16  0957     History   Chief Complaint Chief Complaint  Patient presents with  . Abdominal Pain    HPI Allison Rodgers is a 83 y.o. female.  The history is provided by the patient.  Abdominal Pain   This is a new problem. Episode onset: 1 week. The problem occurs constantly. The problem has been gradually worsening. The pain is associated with an unknown factor. The pain is located in the periumbilical region, suprapubic region and LLQ. The pain is moderate. Associated symptoms include fever, diarrhea, nausea and vomiting. The symptoms are aggravated by palpation. Nothing relieves the symptoms. Past medical history comments: diverticulosis.    Past Medical History:  Diagnosis Date  . Arthritis   . Bacterial meningitis   . 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 Chronic abdominal pain   . Diverticulosis   . DJD (degenerative joint disease)   . DM (diabetes mellitus) ([place])   . Dyslipidemia   . Edema   . Esophageal stricture   . Gastritis, chronic   . GERD (gastroesophageal reflux disease)   . Hiatal hernia   . Hx of adenomatous colonic polyps   . Hypertension   . Hypertensive cardiovascular disease   . Microcytic anemia   . Obesity   . Renal disorder     Patient Active Problem List   Diagnosis Date Noted  . Diverticulitis 09/18/2016  . Moderate dementia 10/31/2014  . Health care maintenance 02/18/2014  . Other malaise and fatigue 11/22/2013  . Vaginal discharge 11/22/2013  . Gastroparesis 08/23/2013  . Abdominal pain, other specified site 07/13/2013  . Chronic renal failure 05/29/2013  . Volume depletion 05/29/2013  . Type II or unspecified type diabetes mellitus with renal manifestations, uncontrolled(250.42) 05/10/2013  . 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 Hyponatremia 05/10/2013  . Glaucoma 05/10/2013  . Type II or  unspecified type diabetes mellitus with neurological manifestations, uncontrolled(250.62) 04/20/2013  . Hypokalemia 04/06/2013  . Candida rash of groin 04/06/2013  . Acute encephalopathy 04/02/2013  . Meningitis 04/02/2013  . Chronic kidney disease, stage IV (severe) ([place]) 04/02/2013  . BENIGN NEOPLASM [place] DIGESTIVE SYSTEM 01/11/2008  . ESOPHAGEAL STRICTURE 01/11/2008  . GERD 10/29/2007  . Type II or unspecified type diabetes mellitus without mention of complication, uncontrolled 06/24/2007  . Morbid obesity ([place]) 06/24/2007  . HYPERTENSION 06/24/2007    Past Surgical History:  Procedure Laterality Date  . APPENDECTOMY    . BACK SURGERY    . BILATERAL OOPHORECTOMY  1957  . CHOLECYSTECTOMY  1999  . HERNIA REPAIR  [id number]   umbilical and ventral hernia repair by Dr. Margot Chimes  . ROTATOR CUFF REPAIR  7/05   right  . VESICOVAGINAL FISTULA CLOSURE W/ TAH  1957    OB History    No data available       Home Medications    Prior to Admission medications   Medication Sig Start Date End Date Taking? Authorizing Provider  acetaminophen (TYLENOL) 500 MG tablet Take 1 tablet (500 mg total) by mouth every 6 (six) hours as needed for moderate pain. 02/11/15  Yes Tori Milks, MD  aspirin 81 MG chewable tablet Chew 81 mg by mouth daily.     Yes Historical Provider, MD  cholecalciferol (VITAMIN D) 400 units TABS tablet Take 800 Units by mouth daily.   Yes Historical Provider, MD  clotrimazole-betamethasone (LOTRISONE) cream Apply  1 application topically 2 (two) times daily.   Yes Historical Provider, MD  Cranberry 500 MG CAPS Take 500 mg by mouth 2 (two) times daily.   Yes Historical Provider, MD  dorzolamide (TRUSOPT) 2 % ophthalmic solution Place 1 drop into both eyes 3 (three) times daily.    Yes Historical Provider, MD  furosemide (LASIX) 20 MG tablet Take 1 tablet (20 mg total) by mouth daily. Patient taking differently: Take 10 mg by mouth daily.  06/01/13  Yes Theodis Blaze,  MD  guaifenesin (ROBITUSSIN) 100 MG/5ML syrup Take 200 mg by mouth every 4 (four) hours. Reported on 02/14/2016   Yes Historical Provider, MD  insulin glargine (LANTUS) 100 UNIT/ML injection Inject 6-12 Units into the skin 2 (two) times daily. Hold if fasting, 12 units in the morning and 6 units at night 06/07/13  Yes Elayne Snare, MD  insulin lispro (HUMALOG) 100 UNIT/ML injection Inject 3 units before breakfast and 3 units before dinner.   Yes Historical Provider, MD  latanoprost (XALATAN) 0.005 % ophthalmic solution Place 1 drop into both eyes at bedtime.    Yes Historical Provider, MD  lisinopril (PRINIVIL,ZESTRIL) 5 MG tablet Take 5 mg by mouth daily. Reported on 02/14/2016 10/24/15  Yes Historical Provider, MD  loperamide (IMODIUM) 2 MG capsule Take 2 mg by mouth daily as needed for diarrhea or loose stools.    Yes Historical Provider, MD  polyethylene glycol powder (GLYCOLAX/MIRALAX) powder Take 17 g by mouth 2 (two) times daily.  06/27/16  Yes Historical Provider, MD  Probiotic Product (ALIGN) 4 MG CAPS Take 4 mg by mouth daily.    Yes Historical Provider, MD  ranitidine (ZANTAC) 150 MG capsule Take 150 mg by mouth 2 (two) times daily.  11/01/15  Yes Historical Provider, MD  sodium chloride (OCEAN) 0.65 % SOLN nasal spray Place 2 sprays into both nostrils as needed for congestion. Reported on 02/14/2016   Yes Historical Provider, MD  sorbitol 70 % solution Take 15 mLs by mouth daily as needed (constipation).    Yes Historical Provider, MD  spironolactone (ALDACTONE) 25 MG tablet Take 25 mg by mouth 2 (two) times daily.   Yes Historical Provider, MD    Family History Family History  Problem Relation Age of Onset  . Stroke Mother   . Cancer Father   . Hypertension Other     Social History Social History  Substance Use Topics  . Smoking status: Never Smoker  . Smokeless tobacco: Never Used  . Alcohol use No     Allergies   Penicillins; Sulfamethoxazole-trimethoprim; Bactrim; and  Trimethoprim   Review of Systems Review of Systems  Constitutional: Positive for fever.  Gastrointestinal: Positive for abdominal pain, diarrhea, nausea and vomiting.  Ten systems are reviewed and are negative for acute change except as noted in the HPI   Physical Exam Updated Vital Signs BP 112/55 (BP Location: Left Arm)   Pulse 94   Temp 101.2 F (38.4 C) (Oral)   Resp 16   SpO2 99%   Physical Exam  Constitutional: She is oriented to person, place, and time. She appears well-developed and well-nourished. No distress.  HENT:  Head: Normocephalic and atraumatic.  Nose: Nose normal.  Eyes: Conjunctivae and EOM are normal. Pupils are equal, round, and reactive to light. Right eye exhibits no discharge. Left eye exhibits no discharge. No scleral icterus.  Neck: Normal range of motion. Neck supple.  Cardiovascular: Normal rate and regular rhythm.  Exam reveals no gallop and no  friction rub.   No murmur heard. Pulmonary/Chest: Effort normal and breath sounds normal. No stridor. No respiratory distress. She has no rales.  Abdominal: Soft. She exhibits no distension. There is tenderness in the suprapubic area and left lower quadrant. There is no rigidity, no rebound and no guarding.  Musculoskeletal: She exhibits no edema or tenderness.  Neurological: She is alert and oriented to person, place, and time.  Skin: Skin is warm and dry. No rash noted. She is not diaphoretic. No erythema.  Psychiatric: She has a normal mood and affect.  Vitals reviewed.    ED Treatments / Results  Labs (all labs ordered are listed, but only abnormal results are displayed) Labs Reviewed  COMPREHENSIVE METABOLIC PANEL - Abnormal; Notable for the following:       Result Value   CO2 20 (*)    BUN 47 (*)    Creatinine, Ser 2.51 (*)    Albumin 3.1 (*)    ALT 9 (*)    GFR calc non Af Amer 16 (*)    GFR calc Af Amer 18 (*)    All other components within normal limits  CBC WITH DIFFERENTIAL/PLATELET -  Abnormal; Notable for the following:    WBC 16.0 (*)    Hemoglobin 10.0 (*)    HCT 31.9 (*)    MCV 74.4 (*)    MCH 23.3 (*)    Neutro Abs 14.6 (*)    Lymphs Abs 0.3 (*)    Monocytes Absolute 1.1 (*)    All other components within normal limits  URINALYSIS, ROUTINE W REFLEX MICROSCOPIC - Abnormal; Notable for the following:    Ketones, ur 5 (*)    All other components within normal limits  C DIFFICILE QUICK SCREEN W PCR REFLEX  LIPASE, BLOOD  CBC  CREATININE, SERUM  BASIC METABOLIC PANEL  CBC  I-STAT CG4 LACTIC ACID, ED    EKG  EKG Interpretation None       Radiology Ct Abdomen Pelvis Wo Contrast  Result Date: 09/18/2016 CLINICAL DATA:  Left lower quadrant abdominal pain. EXAM: CT ABDOMEN AND PELVIS WITHOUT CONTRAST TECHNIQUE: Multidetector CT imaging of the abdomen and pelvis was performed following the standard protocol without IV contrast. COMPARISON:  CT scan of May 03, 2016. FINDINGS: Lower chest: 6 mm nodule is noted in in lingular segment of left upper lobe. Multiple other smaller nodules are noted in the same area. Hepatobiliary: No focal liver abnormality is seen. Status post cholecystectomy. No biliary dilatation. Pancreas: Unremarkable. No pancreatic ductal dilatation or surrounding inflammatory changes. Spleen: Normal in size without focal abnormality. Adrenals/Urinary Tract: Adrenal glands are unremarkable. Kidneys are normal, without renal calculi, focal lesion, or hydronephrosis. Bladder is unremarkable. Stomach/Bowel: There is no evidence of bowel obstruction. There is again noted diverticulosis of the sigmoid colon with surrounding inflammatory changes most consistent with sigmoid diverticulitis. However, possible mass cannot be excluded. Vascular/Lymphatic: Aortic atherosclerosis. No enlarged abdominal or pelvic lymph nodes. Reproductive: Status post hysterectomy. No adnexal masses. Other: No abdominal wall hernia or abnormality. No abdominopelvic ascites.  Musculoskeletal: No acute or significant osseous findings. IMPRESSION: 6 mm nodule seen in left upper lobe. Multiple smaller nodules are noted in the adjacent area. Non-contrast chest CT at 3-6 months is recommended. If the nodules are stable at time of repeat CT, then future CT at 18-24 months (from today's scan) is considered optional for low-risk patients, but is recommended for high-risk patients. This recommendation follows the consensus statement: Guidelines for Management of Incidental Pulmonary Nodules  Detected on CT Images: From the Fleischner Society 2017; Radiology 2017; [phone]. Diverticulosis of sigmoid colon is noted with wall thickening and surrounding inflammation most consistent with sigmoid diverticulitis. However, the possibility of mass or neoplasm cannot be excluded given the amount of wall thickening present, and sigmoidoscopy is recommended for further evaluation. Electronically Signed   By: Marijo Conception, M.D.   On: 09/18/2016 14:20   Dg Chest 2 View  Result Date: 09/18/2016 CLINICAL DATA:  Fever EXAM: CHEST  2 VIEW COMPARISON:  02/11/2015 FINDINGS: Patchy nodular opacities are noted in the left upper lobe. This could reflect nodular infiltrate/pneumonia. Right lung is clear. Heart is normal size. No effusions or acute bony abnormality. IMPRESSION: Patchy nodular airspace opacities in the left upper lobe, likely pneumonia. Followup PA and lateral chest X-ray is recommended in 3-4 weeks following trial of antibiotic therapy to ensure resolution and exclude underlying malignancy. Electronically Signed   By: Rolm Baptise M.D.   On: 09/18/2016 10:46    Procedures Procedures (including critical care time)  Medications Ordered in ED Medications  ciprofloxacin (CIPRO) IVPB 400 mg (400 mg Intravenous New Bag/Given 09/18/16 1654)  famotidine (PEPCID) tablet 20 mg (not administered)  acetaminophen (TYLENOL) tablet 500 mg (not administered)  latanoprost (XALATAN) 0.005 % ophthalmic  solution 1 drop (not administered)  dorzolamide (TRUSOPT) 2 % ophthalmic solution 1 drop (not administered)  aspirin chewable tablet 81 mg (not administered)  heparin injection 5,000 Units (not administered)  0.9 %  sodium chloride infusion (not administered)  ondansetron (ZOFRAN) tablet 4 mg (not administered)    Or  ondansetron (ZOFRAN) injection 4 mg (not administered)  morphine 2 MG/ML injection 1 mg (not administered)  insulin aspart (novoLOG) injection 0-15 Units (not administered)  ciprofloxacin (CIPRO) IVPB 400 mg (not administered)  metroNIDAZOLE (FLAGYL) IVPB 500 mg (not administered)  acetaminophen (TYLENOL) tablet 650 mg (650 mg Oral Given 09/18/16 1126)  sodium chloride 0.9 % bolus 1,000 mL (0 mLs Intravenous Stopped 09/18/16 1337)  iopamidol (ISOVUE-300) 61 % injection (30 mLs Oral Contrast Given 09/18/16 1145)  metroNIDAZOLE (FLAGYL) IVPB 500 mg (0 mg Intravenous Stopped 09/18/16 1650)     Initial Impression / Assessment and Plan / ED Course  I have reviewed the triage vital signs and the nursing notes.  Pertinent labs & imaging results that were available during my care of the patient were reviewed by me and considered in my medical decision making (see chart for details).  Clinical Course as of Sep 19 1723  Wed Sep 18, 2016  1513 Workup consistent with diverticulitis with no evidence of perforation or abscess. No evidence of sepsis at this time. Given patient's decreased by mouth intake and acute renal insufficiency, we'll provide patient with IV antibiotics and admitted to hospitalist service for continued management.  [PC]    Clinical Course User Index [PC] Fatima Blank, MD      Final Clinical Impressions(s) / ED Diagnoses   Final diagnoses:  Diverticulitis of large intestine without perforation or abscess, unspecified bleeding status      Fatima Blank, MD 09/18/16 1726

## 2016-09-18 NOTE — ED Triage Notes (Signed)
She comes to [place] from [facility] unit with c/o abd. Discomfort and few episodes of vomiting this morning. She is in no distress.

## 2016-09-18 NOTE — ED Notes (Signed)
Not enough specimen for a stool sample

## 2016-09-19 ENCOUNTER — Telehealth: Payer: Self-pay

## 2016-09-19 DIAGNOSIS — K5732 Diverticulitis of large intestine without perforation or abscess without bleeding: Secondary | ICD-10-CM | POA: Diagnosis not present

## 2016-09-19 DIAGNOSIS — E162 Hypoglycemia, unspecified: Secondary | ICD-10-CM

## 2016-09-19 DIAGNOSIS — E119 Type 2 diabetes mellitus without complications: Secondary | ICD-10-CM | POA: Diagnosis not present

## 2016-09-19 DIAGNOSIS — N179 Acute kidney failure, unspecified: Secondary | ICD-10-CM | POA: Diagnosis not present

## 2016-09-19 DIAGNOSIS — E11649 Type 2 diabetes mellitus with hypoglycemia without coma: Secondary | ICD-10-CM | POA: Diagnosis not present

## 2016-09-19 DIAGNOSIS — N183 Chronic kidney disease, stage 3 (moderate): Secondary | ICD-10-CM

## 2016-09-19 DIAGNOSIS — N184 Chronic kidney disease, stage 4 (severe): Secondary | ICD-10-CM | POA: Diagnosis not present

## 2016-09-19 LAB — CBC WITH DIFFERENTIAL/PLATELET
Basophils Absolute: 0 10*3/uL (ref 0.0–0.1)
Basophils Relative: 0 %
EOS ABS: 0 10*3/uL (ref 0.0–0.7)
EOS PCT: 0 %
HCT: 29.8 % — ABNORMAL LOW (ref 36.0–46.0)
HEMOGLOBIN: 9.5 g/dL — AB (ref 12.0–15.0)
LYMPHS ABS: 1.4 10*3/uL (ref 0.7–4.0)
LYMPHS PCT: 11 %
MCH: 23.8 pg — AB (ref 26.0–34.0)
MCHC: 31.9 g/dL (ref 30.0–36.0)
MCV: 74.5 fL — AB (ref 78.0–100.0)
MONOS PCT: 6 %
Monocytes Absolute: 0.8 10*3/uL (ref 0.1–1.0)
NEUTROS PCT: 83 %
Neutro Abs: 11 10*3/uL — ABNORMAL HIGH (ref 1.7–7.7)
Platelets: 314 10*3/uL (ref 150–400)
RBC: 4 MIL/uL (ref 3.87–5.11)
RDW: 15.1 % (ref 11.5–15.5)
WBC: 13.2 10*3/uL — ABNORMAL HIGH (ref 4.0–10.5)

## 2016-09-19 LAB — BASIC METABOLIC PANEL
Anion gap: 9 (ref 5–15)
BUN: 39 mg/dL — AB (ref 6–20)
CHLORIDE: 109 mmol/L (ref 101–111)
CO2: 20 mmol/L — AB (ref 22–32)
CREATININE: 1.98 mg/dL — AB (ref 0.44–1.00)
Calcium: 9 mg/dL (ref 8.9–10.3)
GFR calc Af Amer: 24 mL/min — ABNORMAL LOW (ref >60)
GFR calc non Af Amer: 21 mL/min — ABNORMAL LOW (ref >60)
GLUCOSE: 66 mg/dL (ref 65–99)
Potassium: 4 mmol/L (ref 3.5–5.1)
SODIUM: 138 mmol/L (ref 135–145)

## 2016-09-19 LAB — GLUCOSE, CAPILLARY
GLUCOSE-CAPILLARY: 52 mg/dL — AB (ref 65–99)
GLUCOSE-CAPILLARY: 129 mg/dL — AB (ref 65–99)
GLUCOSE-CAPILLARY: 141 mg/dL — AB (ref 65–99)
Glucose-Capillary: 79 mg/dL (ref 65–99)
Glucose-Capillary: 113 mg/dL — ABNORMAL HIGH (ref 65–99)

## 2016-09-19 MED ORDER — INSULIN ASPART 100 UNIT/ML ~~LOC~~ SOLN
0.0000 [IU] | Freq: Three times a day (TID) | SUBCUTANEOUS | Status: DC
  Administered 2016-09-19: 1 [IU] via SUBCUTANEOUS
  Administered 2016-09-20: 5 [IU] via SUBCUTANEOUS
  Administered 2016-09-20 (×2): 2 [IU] via SUBCUTANEOUS
  Administered 2016-09-21 (×2): 3 [IU] via SUBCUTANEOUS
  Administered 2016-09-21: 2 [IU] via SUBCUTANEOUS
  Administered 2016-09-22: 3 [IU] via SUBCUTANEOUS
  Administered 2016-09-22: 2 [IU] via SUBCUTANEOUS

## 2016-09-19 MED ORDER — KCL IN DEXTROSE-NACL 10-5-0.45 MEQ/L-%-% IV SOLN
INTRAVENOUS | Status: DC
  Administered 2016-09-19 – 2016-09-20 (×2): via INTRAVENOUS
  Filled 2016-09-19 (×3): qty 1000

## 2016-09-19 NOTE — Progress Notes (Signed)
Enteric precautions d/c'd d/t C Diff and MRSA PCR resulting as negative.

## 2016-09-19 NOTE — Progress Notes (Signed)
PROGRESS NOTE  Allison Rodgers  [id number] DOB: July 29, 1924 DOA: 09/18/2016 PCP: Sande Brothers, MD  Brief Narrative:   The patient is a 83 year old female with history of diabetes mellitus type 2, hypertension, severe diverticulosis, and GERD who presented to the emergency department after she was febrile and confused at her skilled nursing facility. She been having some left lower quadrant abdominal pain and watery diarrhea with vomiting. CT scan of abdomen and pelvis demonstrated severe diverticulosis with signs of diverticulitis, white blood cell count 16 with acute on chronic kidney injury. She was started on broad-spectrum antibiotics and IV fluids and has improved since yesterday.  Assessment & Plan:   Active Problems:   Diverticulitis  Diverticulitis - Hx of severe diverticulosis last colonoscopy on 2009 - CT shows signs of inflammation consistent with sigmoid diverticulitis, elevated WBC and fever.  -  Advance to a full liquid diet - Continue IVF  - Continue Cipro/Flagyl  GI consult appreciated C. difficile PCR negative Pain control PRN   Acute on CKD stage III - likely 2/2 dehydration from diarrhea, and continuation of diuretics, improving with IV fluids  Continue IV fluids Hold Lasix and aldactone  BMP in AM  Avoid nephrotoxins   DM type II - well controlled last A1C 5.5 - seen by Endocrinology on 08/15/16, hypoglycemic this morning Hold home insuline given NPO status Decrease to low-dose SSI  Start dextrose-containing fluids until she is tolerating by mouth Monitor CBG's    HTN soft likely from dehydration  Monitor BP  Holding Lasix and Aldactone   Iron deficiency anemia - stable, no signs of overt bleeding  Monitor for now  Start iron supplement prior to discharge   DVT prophylaxis: Heparin  Code Status: DNR  Family Communication:  no family present at bedside  Disposition Plan: Anticipate discharge to previous home environment in a few  days  Consultants:   [place] [facility]  Procedures:  None  Antimicrobials:  Anti-infectives    Start     Dose/Rate Route Frequency Ordered Stop   09/19/16 1600  ciprofloxacin (CIPRO) IVPB 400 mg     400 mg 200 mL/hr over 60 Minutes Intravenous Every 24 hours 09/18/16 1701     09/18/16 2200  metroNIDAZOLE (FLAGYL) IVPB 500 mg     500 mg 100 mL/hr over 60 Minutes Intravenous Every 8 hours 09/18/16 1701     09/18/16 1630  metroNIDAZOLE (FLAGYL) IVPB 500 mg  Status:  Discontinued     500 mg 100 mL/hr over 60 Minutes Intravenous Every 8 hours 09/18/16 1615 09/18/16 1701   09/18/16 1515  ciprofloxacin (CIPRO) IVPB 400 mg     400 mg 200 mL/hr over 60 Minutes Intravenous  Once 09/18/16 1513 09/18/16 1754   09/18/16 1515  metroNIDAZOLE (FLAGYL) IVPB 500 mg     500 mg 100 mL/hr over 60 Minutes Intravenous  Once 09/18/16 1513 09/18/16 1650       Subjective:  Embarrassed that she was so confused overnight and feels like she is in her right mind currently. She is still having pretty severe left lower quadrant pain and also some pains intermittently in the right lower quadrant. Her stomach is rumbling and she has been having diarrhea.  Objective: Vitals:   09/19/16 0311 09/19/16 0400 09/19/16 0510 09/19/16 1414  BP:   (!) 105/55 (!) 118/54  Pulse:   75 (!) 58  Resp:   16   Temp:   97.6 F (36.4 C) 97.5 F (36.4 C)  TempSrc:   Oral  Oral  SpO2:   100% 100%  Weight: 62.1 kg (137 lb)     Height:  5\' 4"  (1.626 m)      Intake/Output Summary (Last 24 hours) at 09/19/16 1913 Last data filed at 09/19/16 1806  Gross per 24 hour  Intake             1720 ml  Output                0 ml  Net             1720 ml   Filed Weights   09/19/16 0311  Weight: 62.1 kg (137 lb)    Examination:  General exam:  Adult Female.  No acute distress.  HEENT:  NCAT, MMM Respiratory system: Clear to auscultation bilaterally Cardiovascular system: Regular rate and rhythm, normal S1/S2. No  murmurs, rubs, gallops or clicks.  Warm extremities Gastrointestinal system:  Hyperactive bowel sounds, mildly distended, tender to palpation in the right lower quadrant, right periumbilical area, and most severely in the left lower quadrant where she has some involuntary guarding. No referred pain.   MSK:  Normal tone and bulk, no lower extremity edema Neuro:  Grossly intact    Data Reviewed: I have personally reviewed following labs and imaging studies  CBC:  Recent Labs Lab 09/18/16 1017 09/18/16 1940 09/19/16 0926  WBC 16.0* 17.6* 13.2*  NEUTROABS 14.6*  --  11.0*  HGB 10.0* 9.3* 9.5*  HCT 31.9* 29.7* 29.8*  MCV 74.4* 74.3* 74.5*  PLT 357 315 [id number]   Basic Metabolic Panel:  Recent Labs Lab 09/18/16 1017 09/18/16 1940 09/19/16 0926  NA 138  --  138  K 4.3  --  4.0  CL 107  --  109  CO2 20*  --  20*  GLUCOSE 89  --  66  BUN 47*  --  39*  CREATININE 2.51* 2.04* 1.98*  CALCIUM 9.6  --  9.0   GFR: Estimated Creatinine Clearance: 16 mL/min (by C-G formula based on SCr of 1.98 mg/dL (H)). Liver Function Tests:  Recent Labs Lab 09/18/16 1017  AST 15  ALT 9*  ALKPHOS 60  BILITOT 1.1  PROT 7.8  ALBUMIN 3.1*    Recent Labs Lab 09/18/16 1017  LIPASE 14   No results for input(s): AMMONIA in the last 168 hours. Coagulation Profile: No results for input(s): INR, PROTIME in the last 168 hours. Cardiac Enzymes: No results for input(s): CKTOTAL, CKMB, CKMBINDEX, TROPONINI in the last 168 hours. BNP (last 3 results) No results for input(s): PROBNP in the last 8760 hours. HbA1C: No results for input(s): HGBA1C in the last 72 hours. CBG:  Recent Labs Lab 09/18/16 2130 09/19/16 0910 09/19/16 1007 09/19/16 1136 09/19/16 1632  GLUCAP 86 52* 79 113* 129*   Lipid Profile: No results for input(s): CHOL, HDL, LDLCALC, TRIG, CHOLHDL, LDLDIRECT in the last 72 hours. Thyroid Function Tests: No results for input(s): TSH, T4TOTAL, FREET4, T3FREE, THYROIDAB in the  last 72 hours. Anemia Panel: No results for input(s): VITAMINB12, FOLATE, FERRITIN, TIBC, IRON, RETICCTPCT in the last 72 hours. Urine analysis:    Component Value Date/Time   COLORURINE YELLOW 09/18/2016 1140   APPEARANCEUR CLEAR 09/18/2016 1140   LABSPEC 1.014 09/18/2016 1140   PHURINE 5.0 09/18/2016 1140   GLUCOSEU NEGATIVE 09/18/2016 1140   GLUCOSEU NEGATIVE 11/15/2013 1134   HGBUR NEGATIVE 09/18/2016 1140   BILIRUBINUR NEGATIVE 09/18/2016 1140   KETONESUR 5 (A) 09/18/2016 1140   PROTEINUR NEGATIVE 09/18/2016 1140  UROBILINOGEN 0.2 11/15/2013 1134   NITRITE NEGATIVE 09/18/2016 1140   LEUKOCYTESUR NEGATIVE 09/18/2016 1140   Sepsis Labs: @LABRCNTIP (procalcitonin:4,lacticidven:4)  ) Recent Results (from the past 240 hour(s))  C difficile quick scan w PCR reflex     Status: None   Collection Time: 09/18/16  4:08 PM  Result Value Ref Range Status   C Diff antigen NEGATIVE NEGATIVE Final   C Diff toxin NEGATIVE NEGATIVE Final   C Diff interpretation No C. difficile detected.  Final  MRSA PCR Screening     Status: None   Collection Time: 09/18/16  7:58 PM  Result Value Ref Range Status   MRSA by PCR NEGATIVE NEGATIVE Final    Comment:        The GeneXpert MRSA Assay (FDA approved for NASAL specimens only), is one component of a comprehensive MRSA colonization surveillance program. It is not intended to diagnose MRSA infection nor to guide or monitor treatment for MRSA infections.       Radiology Studies: Ct Abdomen Pelvis Wo Contrast  Result Date: 09/18/2016 CLINICAL DATA:  Left lower quadrant abdominal pain. EXAM: CT ABDOMEN AND PELVIS WITHOUT CONTRAST TECHNIQUE: Multidetector CT imaging of the abdomen and pelvis was performed following the standard protocol without IV contrast. COMPARISON:  CT scan of May 03, 2016. FINDINGS: Lower chest: 6 mm nodule is noted in in lingular segment of left upper lobe. Multiple other smaller nodules are noted in the same area.  Hepatobiliary: No focal liver abnormality is seen. Status post cholecystectomy. No biliary dilatation. Pancreas: Unremarkable. No pancreatic ductal dilatation or surrounding inflammatory changes. Spleen: Normal in size without focal abnormality. Adrenals/Urinary Tract: Adrenal glands are unremarkable. Kidneys are normal, without renal calculi, focal lesion, or hydronephrosis. Bladder is unremarkable. Stomach/Bowel: There is no evidence of bowel obstruction. There is again noted diverticulosis of the sigmoid colon with surrounding inflammatory changes most consistent with sigmoid diverticulitis. However, possible mass cannot be excluded. Vascular/Lymphatic: Aortic atherosclerosis. No enlarged abdominal or pelvic lymph nodes. Reproductive: Status post hysterectomy. No adnexal masses. Other: No abdominal wall hernia or abnormality. No abdominopelvic ascites. Musculoskeletal: No acute or significant osseous findings. IMPRESSION: 6 mm nodule seen in left upper lobe. Multiple smaller nodules are noted in the adjacent area. Non-contrast chest CT at 3-6 months is recommended. If the nodules are stable at time of repeat CT, then future CT at 18-24 months (from today's scan) is considered optional for low-risk patients, but is recommended for high-risk patients. This recommendation follows the consensus statement: Guidelines for Management of Incidental Pulmonary Nodules Detected on CT Images: From the Fleischner Society 2017; Radiology 2017; 284:228-243. Diverticulosis of sigmoid colon is noted with wall thickening and surrounding inflammation most consistent with sigmoid diverticulitis. However, the possibility of mass or neoplasm cannot be excluded given the amount of wall thickening present, and sigmoidoscopy is recommended for further evaluation. Electronically Signed   By: Marijo Conception, M.D.   On: 09/18/2016 14:20   Dg Chest 2 View  Result Date: 09/18/2016 CLINICAL DATA:  Fever EXAM: CHEST  2 VIEW COMPARISON:   02/11/2015 FINDINGS: Patchy nodular opacities are noted in the left upper lobe. This could reflect nodular infiltrate/pneumonia. Right lung is clear. Heart is normal size. No effusions or acute bony abnormality. IMPRESSION: Patchy nodular airspace opacities in the left upper lobe, likely pneumonia. Followup PA and lateral chest X-ray is recommended in 3-4 weeks following trial of antibiotic therapy to ensure resolution and exclude underlying malignancy. Electronically Signed   By: Rolm Baptise M.D.  On: 09/18/2016 10:46     Scheduled Meds: . aspirin  81 mg Oral Daily  . ciprofloxacin  400 mg Intravenous Q24H  . dorzolamide  1 drop Both Eyes TID  . famotidine  20 mg Oral BID  . insulin aspart  0-9 Units Subcutaneous TID WC  . latanoprost  1 drop Both Eyes QHS  . metronidazole  500 mg Intravenous Q8H   Continuous Infusions: . dextrose 5 % and 0.45 % NaCl with KCl 10 mEq/L 75 mL/hr at 09/19/16 1223     LOS: 1 day    Time spent: 30 min    Janece Canterbury, MD [facility] Pager [phone]  If 7PM-7AM, please contact night-coverage [email or web address] Password [facility] 09/19/2016, 7:13 PM

## 2016-09-19 NOTE — Progress Notes (Signed)
Hypoglycemic Event  CBG: 52  Treatment: Apple Juice and patient received tray  Symptoms: Headache  Follow-up CBG: Time:1007 CBG Result:79  Possible Reasons for Event: Patient was NPO      Kaspian Muccio

## 2016-09-19 NOTE — Clinical Social Work Note (Signed)
MSW has contacted RN, Anderson Malta of [facility] in regards to discharge planning. MSW to fax updated clinicals for review and RN, Anderson Malta plans to visit patient at bedside to complete an evaluation for possible return.   MSW remains available as needed.   Glendon Axe, MSW [phone] 09/19/2016 11:09 AM

## 2016-09-19 NOTE — Progress Notes (Signed)
Plan for d/c to ALF, discharge planning per CSW. [phone] 

## 2016-09-19 NOTE — Progress Notes (Signed)
Pt is refusing CBGs. Two different NTs tried and patient refused both times and became combative. Will try again once family arrives. Will continue to monitor.

## 2016-09-19 NOTE — Progress Notes (Addendum)
Pt refused bld work this am.  Phlebotomist tried twice but pt refused both times.  Pt also refused Heparin this am

## 2016-09-19 NOTE — Telephone Encounter (Signed)
Appt on 10/18/16 at 1:30 pm with Dr Ardis Hughs, pt will be notified at discharge.

## 2016-09-19 NOTE — Consult Note (Signed)
Consultation  Referring Provider:   Dr. Sheran Fava    Primary Care Physician:  Sande Brothers, MD Primary Gastroenterologist:  Dr. Sharlett Iles       Reason for Consultation:  Abnormal CT abdomen/pelvis, Diverticulitis            HPI:   Allison Rodgers is a 83 y.o. African American female the past medical history of arthritis, chronic abdominal pain, diabetes, dyslipidemia, GERD, gastritis, hypertension and others listed below, who presented to the ED on 09/18/16 with a complaint of fever, abdominal pain and diarrhea.   At time of interview today, the patient is in the room as well as her son, who assist with her history. The patient describes that she has had abdominal pain off and on for "quite sometime", it should be noted that she is a poor historian and somewhat confused. Her son tells me that she has complained of left lower quadrant abdominal pain for "years". He tells me that she has a very "poor diet" and is inconsistent with what she eats. He believes that this has been contributing. Apparently she developed a fever and confusion yesterday with a max of 102 per family members. She had also been experiencing persistent mild to moderate watery diarrhea associated with nausea and vomiting over the past 2 weeks. She had a decreased oral intake with poor appetite. They denied any blood in her stools or recent antibiotic use.  Patient denies continue nausea, vomiting or change in bowel habits.  ED Course: CT abdomen show severe diverticulosis with signs of diverticulitis. AKI with Cr of 2.5 and elevated WBC 16.  Past GI history: 12/30/12-EGD, Dr. Sharlett Iles: Duodenal mucosa showed no abnormalities in the bulb and second portion of duodenum, normal mucosa in the stomach and esophagus with fairly prominent hiatal hernia extending 12/14/07-EGD, Dr. Sharlett Iles: Normal proximal esophagus to mid esophagus, prolapsing 5 cm hiatal hernia, normal fundus to second portion of the duodenum, stricture which was  dilated 12/14/07-colonoscopy, Dr. Sharlett Iles: Diverticulosis, severe disease from descending colon to the sigmoid colon, red-haustral folds and spasm as well as colon polyps (Others listed in Chart Review)  Past Medical History:  Diagnosis Date  . Arthritis   . Bacterial meningitis   . 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 Chronic abdominal pain   . Diverticulosis   . DJD (degenerative joint disease)   . DM (diabetes mellitus) ([place])   . Dyslipidemia   . Edema   . Esophageal stricture   . Gastritis, chronic   . GERD (gastroesophageal reflux disease)   . Hiatal hernia   . Hx of adenomatous colonic polyps   . Hypertension   . Hypertensive cardiovascular disease   . Microcytic anemia   . Obesity   . Renal disorder     Past Surgical History:  Procedure Laterality Date  . APPENDECTOMY    . BACK SURGERY    . BILATERAL OOPHORECTOMY  1957  . CHOLECYSTECTOMY  1999  . HERNIA REPAIR  [id number]   umbilical and ventral hernia repair by Dr. Margot Chimes  . ROTATOR CUFF REPAIR  7/05   right  . VESICOVAGINAL FISTULA CLOSURE W/ TAH  1957    Family History  Problem Relation Age of Onset  . Stroke Mother   . Cancer Father   . Hypertension Other     Social History  Substance Use Topics  . Smoking status: Never Smoker  . Smokeless tobacco: Never Used  . Alcohol use No  Prior to Admission medications   Medication Sig Start Date End Date Taking? Authorizing Provider  acetaminophen (TYLENOL) 500 MG tablet Take 1 tablet (500 mg total) by mouth every 6 (six) hours as needed for moderate pain. 02/11/15  Yes Tori Milks, MD  aspirin 81 MG chewable tablet Chew 81 mg by mouth daily.     Yes Historical Provider, MD  cholecalciferol (VITAMIN D) 400 units TABS tablet Take 800 Units by mouth daily.   Yes Historical Provider, MD  clotrimazole-betamethasone (LOTRISONE) cream Apply 1 application topically 2 (two) times daily.   Yes Historical Provider, MD  Cranberry 500  MG CAPS Take 500 mg by mouth 2 (two) times daily.   Yes Historical Provider, MD  dorzolamide (TRUSOPT) 2 % ophthalmic solution Place 1 drop into both eyes 3 (three) times daily.    Yes Historical Provider, MD  furosemide (LASIX) 20 MG tablet Take 1 tablet (20 mg total) by mouth daily. Patient taking differently: Take 10 mg by mouth daily.  06/01/13  Yes Theodis Blaze, MD  guaifenesin (ROBITUSSIN) 100 MG/5ML syrup Take 200 mg by mouth every 4 (four) hours. Reported on 02/14/2016   Yes Historical Provider, MD  insulin glargine (LANTUS) 100 UNIT/ML injection Inject 6-12 Units into the skin 2 (two) times daily. Hold if fasting, 12 units in the morning and 6 units at night 06/07/13  Yes Elayne Snare, MD  insulin lispro (HUMALOG) 100 UNIT/ML injection Inject 3 units before breakfast and 3 units before dinner.   Yes Historical Provider, MD  latanoprost (XALATAN) 0.005 % ophthalmic solution Place 1 drop into both eyes at bedtime.    Yes Historical Provider, MD  lisinopril (PRINIVIL,ZESTRIL) 5 MG tablet Take 5 mg by mouth daily. Reported on 02/14/2016 10/24/15  Yes Historical Provider, MD  loperamide (IMODIUM) 2 MG capsule Take 2 mg by mouth daily as needed for diarrhea or loose stools.    Yes Historical Provider, MD  polyethylene glycol powder (GLYCOLAX/MIRALAX) powder Take 17 g by mouth 2 (two) times daily.  06/27/16  Yes Historical Provider, MD  Probiotic Product (ALIGN) 4 MG CAPS Take 4 mg by mouth daily.    Yes Historical Provider, MD  ranitidine (ZANTAC) 150 MG capsule Take 150 mg by mouth 2 (two) times daily.  11/01/15  Yes Historical Provider, MD  sodium chloride (OCEAN) 0.65 % SOLN nasal spray Place 2 sprays into both nostrils as needed for congestion. Reported on 02/14/2016   Yes Historical Provider, MD  sorbitol 70 % solution Take 15 mLs by mouth daily as needed (constipation).    Yes Historical Provider, MD  spironolactone (ALDACTONE) 25 MG tablet Take 25 mg by mouth 2 (two) times daily.   Yes Historical  Provider, MD    Current Facility-Administered Medications  Medication Dose Route Frequency Provider Last Rate Last Dose  . 0.9 %  sodium chloride infusion   Intravenous Continuous Doreatha Lew, MD 75 mL/hr at 09/19/16 0505    . acetaminophen (TYLENOL) tablet 500 mg  500 mg Oral Q6H PRN Doreatha Lew, MD      . aspirin chewable tablet 81 mg  81 mg Oral Daily Doreatha Lew, MD   81 mg at 09/19/16 [id number]  . ciprofloxacin (CIPRO) IVPB 400 mg  400 mg Intravenous Q24H Drew A Wofford, RPH      . dorzolamide (TRUSOPT) 2 % ophthalmic solution 1 drop  1 drop Both Eyes TID Doreatha Lew, MD   1 drop at 09/19/16 0921  . famotidine (PEPCID)  tablet 20 mg  20 mg Oral BID Doreatha Lew, MD   20 mg at 09/19/16 [id number]  . insulin aspart (novoLOG) injection 0-15 Units  0-15 Units Subcutaneous TID WC Doreatha Lew, MD      . latanoprost (XALATAN) 0.005 % ophthalmic solution 1 drop  1 drop Both Eyes QHS Doreatha Lew, MD   1 drop at 09/18/16 2027  . metroNIDAZOLE (FLAGYL) IVPB 500 mg  500 mg Intravenous Q8H Drew A Wofford, RPH   500 mg at 09/19/16 0517  . morphine 2 MG/ML injection 1 mg  1 mg Intravenous Q4H PRN Doreatha Lew, MD      . ondansetron [facility]) tablet 4 mg  4 mg Oral Q6H PRN Doreatha Lew, MD       Or  . ondansetron [facility]) injection 4 mg  4 mg Intravenous Q6H PRN Doreatha Lew, MD   4 mg at 09/18/16 1805    Allergies as of 09/18/2016 - Review Complete 09/18/2016  Allergen Reaction Noted  . Penicillins Other (See Comments)   . Sulfamethoxazole-trimethoprim Swelling   . Bactrim  03/07/2011  . Trimethoprim  04/15/2013     Review of Systems:    Constitutional: Positive for fever and chills  Skin: No rash Cardiovascular: No chest pain Respiratory: No SOB  Gastrointestinal: See HPI and otherwise negative Genitourinary: No dysuria or change in urinary frequency Neurological: No headache Musculoskeletal: No new muscle or joint pain Hematologic: No  bleeding or bruising Psychiatric: No history of depression or anxiety   Physical Exam:  Vital signs in last 24 hours: Temp:  [97.6 F (36.4 C)-101.2 F (38.4 C)] 97.6 F (36.4 C) (02/08 0510) Pulse Rate:  [68-94] 75 (02/08 0510) Resp:  [16-18] 16 (02/08 0510) BP: (97-132)/(40-96) 105/55 (02/08 0510) SpO2:  [93 %-100 %] 100 % (02/08 0510) Weight:  [137 lb (62.1 kg)] 137 lb (62.1 kg) (02/08 0311) Last BM Date: 09/18/16 General: Elderly African [place] female appears to be in NAD, Well developed, Well nourished, alert and cooperative Head:  Normocephalic and atraumatic. Eyes:   PEERL, EOMI. No icterus. Conjunctiva pink. Ears:  Normal auditory acuity. Neck:  Supple Throat: Oral cavity and pharynx without inflammation, swelling or lesion. Teeth in good condition. Lungs: Respirations even and unlabored. Lungs clear to auscultation bilaterally.   No wheezes, crackles, or rhonchi.  Heart: Normal S1, S2. No MRG. Regular rate and rhythm. No peripheral edema, cyanosis or pallor.  Abdomen:  Soft, nondistended, Moderate generalized tenderness, worse in LLQ with involuntary guarding, Normal bowel sounds. No appreciable masses or hepatomegaly. Rectal:  Not performed.  Msk:  Symmetrical without gross deformities. Peripheral pulses intact.  Extremities:  Without edema, no deformity or joint abnormality. Neurologic:  Alert and  oriented x4;  grossly normal neurologically.  Skin:   Dry and intact without significant lesions or rashes. Psychiatric: Demonstrates good judgement and reason without abnormal affect or behaviors.   LAB RESULTS:  Recent Labs  09/18/16 1017 09/18/16 1940 09/19/16 0926  WBC 16.0* 17.6* 13.2*  HGB 10.0* 9.3* 9.5*  HCT 31.9* 29.7* 29.8*  PLT 357 315 314   BMET  Recent Labs  09/18/16 1017 09/18/16 1940  NA 138  --   K 4.3  --   CL 107  --   CO2 20*  --   GLUCOSE 89  --   BUN 47*  --   CREATININE 2.51* 2.04*  CALCIUM 9.6  --    LFT  Recent Labs   09/18/16 1017  PROT 7.8  ALBUMIN 3.1*  AST 15  ALT 9*  ALKPHOS 60  BILITOT 1.1   PT/INR No results for input(s): LABPROT, INR in the last 72 hours.  STUDIES: Ct Abdomen Pelvis Wo Contrast  Result Date: 09/18/2016 CLINICAL DATA:  Left lower quadrant abdominal pain. EXAM: CT ABDOMEN AND PELVIS WITHOUT CONTRAST TECHNIQUE: Multidetector CT imaging of the abdomen and pelvis was performed following the standard protocol without IV contrast. COMPARISON:  CT scan of May 03, 2016. FINDINGS: Lower chest: 6 mm nodule is noted in in lingular segment of left upper lobe. Multiple other smaller nodules are noted in the same area. Hepatobiliary: No focal liver abnormality is seen. Status post cholecystectomy. No biliary dilatation. Pancreas: Unremarkable. No pancreatic ductal dilatation or surrounding inflammatory changes. Spleen: Normal in size without focal abnormality. Adrenals/Urinary Tract: Adrenal glands are unremarkable. Kidneys are normal, without renal calculi, focal lesion, or hydronephrosis. Bladder is unremarkable. Stomach/Bowel: There is no evidence of bowel obstruction. There is again noted diverticulosis of the sigmoid colon with surrounding inflammatory changes most consistent with sigmoid diverticulitis. However, possible mass cannot be excluded. Vascular/Lymphatic: Aortic atherosclerosis. No enlarged abdominal or pelvic lymph nodes. Reproductive: Status post hysterectomy. No adnexal masses. Other: No abdominal wall hernia or abnormality. No abdominopelvic ascites. Musculoskeletal: No acute or significant osseous findings. IMPRESSION: 6 mm nodule seen in left upper lobe. Multiple smaller nodules are noted in the adjacent area. Non-contrast chest CT at 3-6 months is recommended. If the nodules are stable at time of repeat CT, then future CT at 18-24 months (from today's scan) is considered optional for low-risk patients, but is recommended for high-risk patients. This recommendation follows the  consensus statement: Guidelines for Management of Incidental Pulmonary Nodules Detected on CT Images: From the Fleischner Society 2017; Radiology 2017; 284:228-243. Diverticulosis of sigmoid colon is noted with wall thickening and surrounding inflammation most consistent with sigmoid diverticulitis. However, the possibility of mass or neoplasm cannot be excluded given the amount of wall thickening present, and sigmoidoscopy is recommended for further evaluation. Electronically Signed   By: Marijo Conception, M.D.   On: 09/18/2016 14:20   Dg Chest 2 View  Result Date: 09/18/2016 CLINICAL DATA:  Fever EXAM: CHEST  2 VIEW COMPARISON:  02/11/2015 FINDINGS: Patchy nodular opacities are noted in the left upper lobe. This could reflect nodular infiltrate/pneumonia. Right lung is clear. Heart is normal size. No effusions or acute bony abnormality. IMPRESSION: Patchy nodular airspace opacities in the left upper lobe, likely pneumonia. Followup PA and lateral chest X-ray is recommended in 3-4 weeks following trial of antibiotic therapy to ensure resolution and exclude underlying malignancy. Electronically Signed   By: Rolm Baptise M.D.   On: 09/18/2016 10:46   PREVIOUS ENDOSCOPIES:            See HPI   Impression / Plan:   Impression: 1. Acute sigmoid diverticulitis: As evidenced on CT as well as elevated white blood cell count, nausea and vomiting and left lower quadrant abdominal pain as well as diarrhea 2. Nausea and vomiting: See above 3. Left lower quadrant abdominal pain: See above  Plan: 1. Dr. Ardis Hughs has personally reviewed the CT and this appears to be typical diverticulitis, at this time recommend treating with IV antibiotics as you are currently, we will arrange for a follow-up appointment with the patient in our clinic in 4 weeks and arrange for a likely sigmoidoscopy for further evaluation after acute inflammation has decreased in 5-6 weeks 2. Did discuss above plan with  the patient and her son and  they are in agreement 3. Continue IV antibiotics as you are 4. Recommend a low residue/ low fiber diet while the patient is here and even for 1-2 weeks after discharge, she should gradually work up to high-fiber diet when she is no longer experiencing pain, discussed this with her son 5. At this time we will sign off, please let us know if we can be of any further assistance  Thank you for your kind consultation.  Lavone Nian [facility]  09/19/2016, 9:52 AM Pager #: [phone]   ________________________________________________________________________  Velora Heckler GI MD note:  I personally examined the patient, reviewed the data and agree with the assessment and plan described above.  She has what appears to be typical acute diverticulitis involving the descending/sigmoid colon.  I do recommend eventual direct visualization of the left colon to exclude cancer which can (rarely) mimic acute diverticulitis. That is generally several weeks after the acute infection but can be done sooner than that if she fails to progress in normal fashion. For now, would give her another 2-3 days of IV antibiotics until she is eating better and her pain is much improved. At that point it will be safe to transition to oral antibiotics (cipro/flagyl) for another 7-10 days. We will arrange for outpatient evaluation in about 4 weeks from now and will plan to schedule flex sigmoidoscopy or full colonoscopy from that appointment. Will sign off for now but please call if she clinically worsens or fails to improve in next few days.  Owens Loffler, MD [facility] Pager [phone]

## 2016-09-19 NOTE — Telephone Encounter (Signed)
-----   Message from [place], [place] sent at 09/19/2016 10:06 AM EST ----- Regarding: Needs OV This patient is currently admitted with acute diverticulitis, she will need OV arranged in 4 weeks from now with either myself or Dr. Ardis Hughs. This will be for follow up and to discuss sigmoidoscopy.  Thanks-JLL

## 2016-09-20 DIAGNOSIS — N179 Acute kidney failure, unspecified: Secondary | ICD-10-CM | POA: Diagnosis not present

## 2016-09-20 DIAGNOSIS — K5732 Diverticulitis of large intestine without perforation or abscess without bleeding: Secondary | ICD-10-CM | POA: Diagnosis not present

## 2016-09-20 LAB — GLUCOSE, CAPILLARY
GLUCOSE-CAPILLARY: 173 mg/dL — AB (ref 65–99)
Glucose-Capillary: 164 mg/dL — ABNORMAL HIGH (ref 65–99)
Glucose-Capillary: 253 mg/dL — ABNORMAL HIGH (ref 65–99)
Glucose-Capillary: 201 mg/dL — ABNORMAL HIGH (ref 65–99)

## 2016-09-20 MED ORDER — KCL IN DEXTROSE-NACL 20-5-0.45 MEQ/L-%-% IV SOLN
INTRAVENOUS | Status: DC
  Administered 2016-09-20 – 2016-09-21 (×2): via INTRAVENOUS
  Filled 2016-09-20 (×3): qty 1000

## 2016-09-20 MED ORDER — FAMOTIDINE 20 MG PO TABS
20.0000 mg | ORAL_TABLET | Freq: Every day | ORAL | Status: DC
  Administered 2016-09-21 – 2016-09-22 (×2): 20 mg via ORAL
  Filled 2016-09-20 (×2): qty 1

## 2016-09-20 MED ORDER — MORPHINE SULFATE (PF) 4 MG/ML IV SOLN
1.0000 mg | INTRAVENOUS | Status: DC | PRN
Start: 2016-09-20 — End: 2016-09-22
  Administered 2016-09-20: 1 mg via INTRAVENOUS
  Filled 2016-09-20: qty 1

## 2016-09-20 MED ORDER — BOOST / RESOURCE BREEZE PO LIQD
1.0000 | Freq: Three times a day (TID) | ORAL | Status: DC
  Administered 2016-09-20 – 2016-09-22 (×6): 1 via ORAL

## 2016-09-20 NOTE — Progress Notes (Signed)
Patient awake and resting in bed.  No complaints of pain.

## 2016-09-20 NOTE — Clinical Social Work Note (Signed)
Clinical information faxed to [facility] for review.   Glendon Axe, MSW [phone] 09/20/2016 10:26 AM

## 2016-09-20 NOTE — Progress Notes (Signed)
PROGRESS NOTE  Allison Rodgers  [id number] DOB: 1923/09/30 DOA: 09/18/2016 PCP: Sande Brothers, MD  Brief Narrative:   The patient is a 83 year old female with history of diabetes mellitus type 2, hypertension, severe diverticulosis, and GERD who presented to the emergency department after she was febrile and confused at her skilled nursing facility. She been having some left lower quadrant abdominal pain and watery diarrhea with vomiting. CT scan of abdomen and pelvis demonstrated severe diverticulosis with signs of diverticulitis, white blood cell count 16 with acute on chronic kidney injury. She was started on broad-spectrum antibiotics and IV fluids and has improved since yesterday.  Assessment & Plan:   Active Problems:   Diverticulitis  Diverticulitis - Hx of severe diverticulosis last colonoscopy on 2009 - CT shows signs of inflammation consistent with sigmoid diverticulitis, elevated WBC and fever.  Only tolerated a few sips yesterday.  Had 5 bites of cream of wheat this morning and a few sips of tomato soup for lunch today.  No other PO intake  -  Advance to a soft diet - Continue IVF  - Continue Cipro/Flagyl  GI consult appreciated C. difficile PCR negative Pain control PRN  -  Nutrition consultation  Acute on CKD stage III - likely 2/2 dehydration from diarrhea, and continuation of diuretics, improving with IV fluids  Continue IV fluids Hold Lasix and aldactone  BMP in AM  Avoid nephrotoxins   DM type II - well controlled last A1C 5.5 - seen by Endocrinology on 08/15/16, hypoglycemia has resolved Continue low-dose SSI  Continue dextrose-containing fluids Monitor CBG's    HTN soft likely from dehydration  Monitor BP  Holding Lasix and Aldactone   Iron deficiency anemia - stable, no signs of overt bleeding  Monitor for now  Start iron supplement prior to discharge   DVT prophylaxis: Heparin  Code Status: DNR  Family Communication:  son and daughter-in-law  present at bedside Disposition Plan: Anticipate discharge to previous home environment in a few days  Consultants:   [place] [facility]  Procedures:  None  Antimicrobials:  Anti-infectives    Start     Dose/Rate Route Frequency Ordered Stop   09/19/16 1600  ciprofloxacin (CIPRO) IVPB 400 mg     400 mg 200 mL/hr over 60 Minutes Intravenous Every 24 hours 09/18/16 1701     09/18/16 2200  metroNIDAZOLE (FLAGYL) IVPB 500 mg     500 mg 100 mL/hr over 60 Minutes Intravenous Every 8 hours 09/18/16 1701     09/18/16 1630  metroNIDAZOLE (FLAGYL) IVPB 500 mg  Status:  Discontinued     500 mg 100 mL/hr over 60 Minutes Intravenous Every 8 hours 09/18/16 1615 09/18/16 1701   09/18/16 1515  ciprofloxacin (CIPRO) IVPB 400 mg     400 mg 200 mL/hr over 60 Minutes Intravenous  Once 09/18/16 1513 09/18/16 1754   09/18/16 1515  metroNIDAZOLE (FLAGYL) IVPB 500 mg     500 mg 100 mL/hr over 60 Minutes Intravenous  Once 09/18/16 1513 09/18/16 1650       Subjective:  Still confused.  Family states she has had considerable weight loss over the last year with worsening memory problems over the last 6 months.  Abdominal pain has been a chronic problem for her.  Only a few bites.  Still having abdominal pain today with nausea but no vomiting.    Objective: Vitals:   09/19/16 2222 09/20/16 0608 09/20/16 0843 09/20/16 1447  BP: (!) 115/46 (!) 97/46 (!) 98/51 101/64  Pulse:  70 (!) 57 (!) 51 61  Resp: 16 16 16 17   Temp: 99.1 F (37.3 C) 98.2 F (36.8 C) 98.1 F (36.7 C) 98.3 F (36.8 C)  TempSrc: Oral Oral Oral Oral  SpO2: 100% 100% 100% 95%  Weight:      Height:        Intake/Output Summary (Last 24 hours) at 09/20/16 1741 Last data filed at 09/20/16 1700  Gross per 24 hour  Intake             1710 ml  Output              550 ml  Net             1160 ml   Filed Weights   09/19/16 0311  Weight: 62.1 kg (137 lb)    Examination:  General exam:  Adult Female.  No acute distress.    HEENT:  NCAT, MMM Respiratory system: Clear to auscultation bilaterally Cardiovascular system: Regular rate and rhythm, normal S1/S2. No murmurs, rubs, gallops or clicks.  Warm extremities Gastrointestinal system:  Hyperactive bowel sounds, mildly distended, tender to palpation in the right lower quadrant, right periumbilical area, and left lower quadrant where she continues to have some involuntary guarding. No referred pain.   MSK:  Normal tone and bulk, no lower extremity edema Neuro:  Grossly intact    Data Reviewed: I have personally reviewed following labs and imaging studies  CBC:  Recent Labs Lab 09/18/16 1017 09/18/16 1940 09/19/16 0926  WBC 16.0* 17.6* 13.2*  NEUTROABS 14.6*  --  11.0*  HGB 10.0* 9.3* 9.5*  HCT 31.9* 29.7* 29.8*  MCV 74.4* 74.3* 74.5*  PLT 357 315 [id number]   Basic Metabolic Panel:  Recent Labs Lab 09/18/16 1017 09/18/16 1940 09/19/16 0926  NA 138  --  138  K 4.3  --  4.0  CL 107  --  109  CO2 20*  --  20*  GLUCOSE 89  --  66  BUN 47*  --  39*  CREATININE 2.51* 2.04* 1.98*  CALCIUM 9.6  --  9.0   GFR: Estimated Creatinine Clearance: 16 mL/min (by C-G formula based on SCr of 1.98 mg/dL (H)). Liver Function Tests:  Recent Labs Lab 09/18/16 1017  AST 15  ALT 9*  ALKPHOS 60  BILITOT 1.1  PROT 7.8  ALBUMIN 3.1*    Recent Labs Lab 09/18/16 1017  LIPASE 14   No results for input(s): AMMONIA in the last 168 hours. Coagulation Profile: No results for input(s): INR, PROTIME in the last 168 hours. Cardiac Enzymes: No results for input(s): CKTOTAL, CKMB, CKMBINDEX, TROPONINI in the last 168 hours. BNP (last 3 results) No results for input(s): PROBNP in the last 8760 hours. HbA1C: No results for input(s): HGBA1C in the last 72 hours. CBG:  Recent Labs Lab 09/19/16 1632 09/19/16 2219 09/20/16 0745 09/20/16 1200 09/20/16 1647  GLUCAP 129* 141* 164* 173* 253*   Lipid Profile: No results for input(s): CHOL, HDL, LDLCALC, TRIG,  CHOLHDL, LDLDIRECT in the last 72 hours. Thyroid Function Tests: No results for input(s): TSH, T4TOTAL, FREET4, T3FREE, THYROIDAB in the last 72 hours. Anemia Panel: No results for input(s): VITAMINB12, FOLATE, FERRITIN, TIBC, IRON, RETICCTPCT in the last 72 hours. Urine analysis:    Component Value Date/Time   COLORURINE YELLOW 09/18/2016 1140   APPEARANCEUR CLEAR 09/18/2016 1140   LABSPEC 1.014 09/18/2016 1140   PHURINE 5.0 09/18/2016 1140   GLUCOSEU NEGATIVE 09/18/2016 1140   GLUCOSEU NEGATIVE 11/15/2013  [place] 09/18/2016 [place] 09/18/2016 1140   KETONESUR 5 (A) 09/18/2016 1140   PROTEINUR NEGATIVE 09/18/2016 1140   UROBILINOGEN 0.2 11/15/2013 1134   NITRITE NEGATIVE 09/18/2016 1140   LEUKOCYTESUR NEGATIVE 09/18/2016 1140   Sepsis Labs: @LABRCNTIP (procalcitonin:4,lacticidven:4)  ) Recent Results (from the past 240 hour(s))  C difficile quick scan w PCR reflex     Status: None   Collection Time: 09/18/16  4:08 PM  Result Value Ref Range Status   C Diff antigen NEGATIVE NEGATIVE Final   C Diff toxin NEGATIVE NEGATIVE Final   C Diff interpretation No C. difficile detected.  Final  MRSA PCR Screening     Status: None   Collection Time: 09/18/16  7:58 PM  Result Value Ref Range Status   MRSA by PCR NEGATIVE NEGATIVE Final    Comment:        The GeneXpert MRSA Assay (FDA approved for NASAL specimens only), is one component of a comprehensive MRSA colonization surveillance program. It is not intended to diagnose MRSA infection nor to guide or monitor treatment for MRSA infections.       Radiology Studies: No results found.   Scheduled Meds: . aspirin  81 mg Oral Daily  . ciprofloxacin  400 mg Intravenous Q24H  . dorzolamide  1 drop Both Eyes TID  . [START ON 09/21/2016] famotidine  20 mg Oral Daily  . feeding supplement  1 Container Oral TID BM  . insulin aspart  0-9 Units Subcutaneous TID WC  . latanoprost  1 drop Both Eyes  QHS  . metronidazole  500 mg Intravenous Q8H   Continuous Infusions: . dextrose 5 % and 0.45 % NaCl with KCl 20 mEq/L 75 mL/hr at 09/20/16 1423     LOS: 2 days    Time spent: 30 min    Janece Canterbury, MD [facility] Pager [phone]  If 7PM-7AM, please contact night-coverage [email or web address] Password [email or web address] 09/20/2016, 5:41 PM

## 2016-09-20 NOTE — NC FL2 (Addendum)
Allison Rodgers MEDICAID FL2 LEVEL OF Rodgers SCREENING TOOL     IDENTIFICATION  Patient Name: Allison Rodgers Birthdate: 03/21/1924 Sex: female Admission Date (Current Location): 09/18/2016  Allison Rodgers and [place] Number:  Allison Rodgers and Address:  Allison Rodgers,  [place] [address] [place]      Provider Number: [id number]  Attending Physician Name and Address:  Allison Canterbury, MD  Relative Name and Phone Number:       Current Level of Rodgers: Rodgers Recommended Level of Rodgers: Allison Rodgers, Allison Rodgers Prior Approval Number:    Date Approved/Denied:   PASRR Number:    Discharge Plan: Other (Comment) (ALF- Allison Rodgers )    Current Diagnoses: Patient Active Problem List   Diagnosis Date Noted  . Diverticulitis 09/18/2016  . Moderate dementia 10/31/2014  . Health Rodgers maintenance 02/18/2014  . Other malaise and fatigue 11/22/2013  . Vaginal discharge 11/22/2013  . Gastroparesis 08/23/2013  . Abdominal pain, other specified site 07/13/2013  . Allison renal failure 05/29/2013  . Volume depletion 05/29/2013  . Type II or unspecified type diabetes mellitus with renal manifestations, uncontrolled(250.42) 05/10/2013  . 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 Hyponatremia 05/10/2013  . Glaucoma 05/10/2013  . Type II or unspecified type diabetes mellitus with neurological manifestations, uncontrolled(250.62) 04/20/2013  . Hypokalemia 04/06/2013  . Candida rash of groin 04/06/2013  . Acute encephalopathy 04/02/2013  . Meningitis 04/02/2013  . Allison kidney disease, stage IV (severe) (Allison Rodgers) 04/02/2013  . BENIGN NEOPLASM Allison Rodgers DIGESTIVE SYSTEM 01/11/2008  . ESOPHAGEAL STRICTURE 01/11/2008  . GERD 10/29/2007  . Type II or unspecified type diabetes mellitus without mention of complication, uncontrolled 06/24/2007  . Morbid obesity (Allison Rodgers) 06/24/2007  . HYPERTENSION 06/24/2007    Orientation RESPIRATION BLADDER Height &  Weight     Self  Normal Continent Weight: 137 lb (62.1 kg) Height:  5\' 4"  (162.6 cm)  BEHAVIORAL SYMPTOMS/MOOD NEUROLOGICAL BOWEL NUTRITION STATUS   (none )  (none ) Continent regular  AMBULATORY STATUS COMMUNICATION OF NEEDS Skin   Independent Verbally Normal                       Personal Rodgers Assistance Level of Assistance  Bathing, Feeding, Dressing Bathing Assistance: Limited assistance Feeding assistance: Independent Dressing Assistance: Limited assistance     Functional Limitations Info  Speech, Hearing, Sight Sight Info: Adequate Hearing Info: Adequate Speech Info: Adequate    SPECIAL Rodgers FACTORS FREQUENCY                       Contractures      Additional Factors Info  Code Status, Allergies Code Status Info: DNR CODE  Allergies Info: Penicillins, Sulfamethoxazole-trimethoprim, Bactrim, Trimethoprim              Discharge Medications: Medication List    STOP taking these medications   furosemide 20 MG tablet Commonly known as:  LASIX   guaifenesin 100 MG/5ML syrup Commonly known as:  ROBITUSSIN   insulin glargine 100 UNIT/ML injection Commonly known as:  LANTUS   insulin lispro 100 UNIT/ML injection Commonly known as:  HUMALOG   lisinopril 5 MG tablet Commonly known as:  PRINIVIL,ZESTRIL   spironolactone 25 MG tablet Commonly known as:  ALDACTONE     TAKE these medications   acetaminophen 500 MG tablet Commonly known as:  TYLENOL Take 1 tablet (500 mg total) by mouth every 6 (six) hours as needed for moderate pain.  ALIGN 4 MG Caps Take 4 mg by mouth daily.   aspirin 81 MG chewable tablet Chew 81 mg by mouth daily.   cholecalciferol 400 units Tabs tablet Commonly known as:  VITAMIN D Take 800 Units by mouth daily.   ciprofloxacin 250 MG tablet Commonly known as:  CIPRO Take 1 tablet (250 mg total) by mouth 2 (two) times daily.   clotrimazole-betamethasone cream Commonly known as:  LOTRISONE Apply  1 application topically 2 (two) times daily.   Cranberry 500 MG Caps Take 500 mg by mouth 2 (two) times daily.   dorzolamide 2 % ophthalmic solution Commonly known as:  TRUSOPT Place 1 drop into both eyes 3 (three) times daily.   feeding supplement Liqd Take 1 Container by mouth 3 (three) times daily between meals.   folic acid 1 MG tablet Commonly known as:  FOLVITE Take 1 tablet (1 mg total) by mouth daily. Start taking on:  09/23/2016   loperamide 2 MG capsule Commonly known as:  IMODIUM Take 2 mg by mouth daily as needed for diarrhea or loose stools.   metroNIDAZOLE 500 MG tablet Commonly known as:  FLAGYL Take 1 tablet (500 mg total) by mouth 3 (three) times daily.   mirtazapine 7.5 MG tablet Commonly known as:  REMERON Take 1 tablet (7.5 mg total) by mouth at bedtime.   polyethylene glycol powder powder Commonly known as:  GLYCOLAX/MIRALAX Take 17 g by mouth 2 (two) times daily.   ranitidine 150 MG capsule Commonly known as:  ZANTAC Take 150 mg by mouth 2 (two) times daily.   sodium chloride 0.65 % Soln nasal spray Commonly known as:  OCEAN Place 2 sprays into both nostrils as needed for congestion. Reported on 02/14/2016   sorbitol 70 % solution Take 15 mLs by mouth daily as needed (constipation).   XALATAN 0.005 % ophthalmic solution Generic drug:  latanoprost Place 1 drop into both eyes at bedtime.      Relevant Imaging Results:  Relevant Lab Results:   Additional Information SSN [id number]  Allison Rodgers

## 2016-09-21 DIAGNOSIS — N179 Acute kidney failure, unspecified: Secondary | ICD-10-CM | POA: Diagnosis not present

## 2016-09-21 DIAGNOSIS — K5732 Diverticulitis of large intestine without perforation or abscess without bleeding: Secondary | ICD-10-CM | POA: Diagnosis not present

## 2016-09-21 DIAGNOSIS — E46 Unspecified protein-calorie malnutrition: Secondary | ICD-10-CM

## 2016-09-21 LAB — CBC
HEMATOCRIT: 25.8 % — AB (ref 36.0–46.0)
Hemoglobin: 7.9 g/dL — ABNORMAL LOW (ref 12.0–15.0)
MCH: 22.6 pg — AB (ref 26.0–34.0)
MCHC: 30.6 g/dL (ref 30.0–36.0)
MCV: 73.9 fL — AB (ref 78.0–100.0)
Platelets: 264 10*3/uL (ref 150–400)
RBC: 3.49 MIL/uL — ABNORMAL LOW (ref 3.87–5.11)
RDW: 15.1 % (ref 11.5–15.5)
WBC: 9.2 10*3/uL (ref 4.0–10.5)

## 2016-09-21 LAB — GLUCOSE, CAPILLARY
GLUCOSE-CAPILLARY: 201 mg/dL — AB (ref 65–99)
Glucose-Capillary: 195 mg/dL — ABNORMAL HIGH (ref 65–99)
Glucose-Capillary: 258 mg/dL — ABNORMAL HIGH (ref 65–99)

## 2016-09-21 LAB — BASIC METABOLIC PANEL
Anion gap: 4 — ABNORMAL LOW (ref 5–15)
BUN: 22 mg/dL — AB (ref 6–20)
CHLORIDE: 112 mmol/L — AB (ref 101–111)
CO2: 19 mmol/L — AB (ref 22–32)
Calcium: 8.4 mg/dL — ABNORMAL LOW (ref 8.9–10.3)
Creatinine, Ser: 1.55 mg/dL — ABNORMAL HIGH (ref 0.44–1.00)
GFR calc Af Amer: 33 mL/min — ABNORMAL LOW (ref >60)
GFR calc non Af Amer: 28 mL/min — ABNORMAL LOW (ref >60)
GLUCOSE: 215 mg/dL — AB (ref 65–99)
POTASSIUM: 4.1 mmol/L (ref 3.5–5.1)
SODIUM: 135 mmol/L (ref 135–145)

## 2016-09-21 LAB — FOLATE: Folate: 4.1 ng/mL — ABNORMAL LOW (ref >5.9)

## 2016-09-21 LAB — VITAMIN B12: Vitamin B-12: 890 pg/mL (ref 180–914)

## 2016-09-21 MED ORDER — FOLIC ACID 1 MG PO TABS
1.0000 mg | ORAL_TABLET | Freq: Every day | ORAL | Status: DC
  Administered 2016-09-22: 1 mg via ORAL
  Filled 2016-09-21: qty 1

## 2016-09-21 MED ORDER — MIRTAZAPINE 15 MG PO TABS
15.0000 mg | ORAL_TABLET | Freq: Every day | ORAL | Status: DC
  Administered 2016-09-21: 15 mg via ORAL
  Filled 2016-09-21: qty 1

## 2016-09-21 MED ORDER — METOCLOPRAMIDE HCL 5 MG/ML IJ SOLN: 5.0000 mg | Freq: Four times a day (QID) | INTRAMUSCULAR | Status: DC | PRN

## 2016-09-21 MED ORDER — SODIUM CHLORIDE 0.9 % IV SOLN
8.0000 mg | Freq: Three times a day (TID) | INTRAVENOUS | Status: DC
  Administered 2016-09-21 – 2016-09-22 (×3): 8 mg via INTRAVENOUS
  Filled 2016-09-21 (×5): qty 4

## 2016-09-21 MED ORDER — SODIUM CHLORIDE 0.9 % IV SOLN
510.0000 mg | Freq: Once | INTRAVENOUS | Status: AC
  Administered 2016-09-21: 510 mg via INTRAVENOUS
  Filled 2016-09-21: qty 17

## 2016-09-21 NOTE — Progress Notes (Signed)
Pharmacy Antibiotic Note  Allison Rodgers is a 83 y.o. female with PMH DM, HTN, anemia admitted on 09/18/2016 with diverticulitis. Pharmacy has been consulted for ciprofloxacin dosing. Acute on chronic kidney disease noted.  Today, 09/21/16: Afebrile. WBCs improved. SCr cont to improve, CrCl ~26N. Advanced to reg diet.   Plan:  Continue Ciprofloxacin 400mg  IV q24h  Continue Flagyl 500mg  IV q8h - per MD  Switch to PO soon?  Height: 5\' 4"  (162.6 cm) Weight: 137 lb (62.1 kg) IBW/kg (Calculated) : 54.7  Temp (24hrs), Avg:98.6 F (37 C), Min:98.2 F (36.8 C), Max:99.3 F (37.4 C)   Recent Labs Lab 09/18/16 1017 09/18/16 1020 09/18/16 1940 09/19/16 0926 09/21/16 0457  WBC 16.0*  --  17.6* 13.2* 9.2  CREATININE 2.51*  --  2.04* 1.98* 1.55*  LATICACIDVEN  --  1.16  --   --   --     Estimated Creatinine Clearance: 20.4 mL/min (by C-G formula based on SCr of 1.55 mg/dL (H)).    Allergies  Allergen Reactions  . Penicillins Other (See Comments)    REACTION: causes hallucinations  . Sulfamethoxazole-Trimethoprim Swelling    REACTION: rash  . Bactrim     Unknown per mar  . Trimethoprim     Unknown per mar     Antimicrobials this admission: 2/7 Cipro >>  2/7 Flagyl >>   Dose adjustments this admission: ---  Microbiology results: 2/7 MRSA PCR: sent 2/7 Cdiff: sent  Thank you for allowing pharmacy to be a part of this patient's care.  Romeo Rabon, PharmD, pager [phone]. 09/21/2016,9:38 AM.

## 2016-09-21 NOTE — Progress Notes (Addendum)
PROGRESS NOTE  Allison Rodgers  [id number] DOB: [birth year] DOA: 09/18/2016 PCP: Sande Brothers, MD  Brief Narrative:   The patient is a 83 year old female with history of diabetes mellitus type 2, hypertension, severe diverticulosis, and GERD who presented to the emergency department after she was febrile and confused at her skilled nursing facility. She been having some left lower quadrant abdominal pain and watery diarrhea with vomiting. CT scan of abdomen and pelvis demonstrated severe diverticulosis with signs of diverticulitis, white blood cell count 16 with acute on chronic kidney injury. She was started on broad-spectrum antibiotics and IV fluids and has improved since yesterday.  Assessment & Plan:   Active Problems:   Diverticulitis  Diverticulitis - Hx of severe diverticulosis last colonoscopy on 2009 - CT shows signs of inflammation consistent with sigmoid diverticulitis, elevated WBC and fever.  Did not eat any dinner last night.  Drank a few sips of apple juice this morning and with great encouragement at about 5 bites of her cereal.   - Continue Cipro/Flagyl  -  Nutrition consultation pending -  Schedule zofran and add prn reglan -  Start mirtazapine as appetite stimulant -  Monitor for side effects  -  Educated family about the natural progression of dementia  Acute on CKD stage III - likely 2/2 dehydration from diarrhea, and continuation of diuretics, creatinine trended down from 2.51 to 1.55 with IVF -  Continue IVF Hold Lasix and aldactone   DM type II - well controlled last A1C 5.5 - seen by Endocrinology on 08/15/16, hypoglycemia has resolved Continue low-dose SSI   Continue dextrose-containing fluids Monitor CBG's    HTN soft likely from dehydration  Monitor BP  Holding Lasix and Aldactone   Iron and folate deficiency anemia - stable, no signs of overt bleeding  Feraheme x 1 on 2/10 Start iron supplement prior to discharge  Start folic acid  DVT  prophylaxis: Heparin  Code Status: DNR  Family Communication:  son and daughter-in-law present at bedside Disposition Plan: Anticipate discharge to previous home environment in a few days  Consultants:   [facility]  Procedures:  None  Antimicrobials:  Anti-infectives    Start     Dose/Rate Route Frequency Ordered Stop   09/19/16 1600  ciprofloxacin (CIPRO) IVPB 400 mg     400 mg 200 mL/hr over 60 Minutes Intravenous Every 24 hours 09/18/16 1701     09/18/16 2200  metroNIDAZOLE (FLAGYL) IVPB 500 mg     500 mg 100 mL/hr over 60 Minutes Intravenous Every 8 hours 09/18/16 1701     09/18/16 1630  metroNIDAZOLE (FLAGYL) IVPB 500 mg  Status:  Discontinued     500 mg 100 mL/hr over 60 Minutes Intravenous Every 8 hours 09/18/16 1615 09/18/16 1701   09/18/16 1515  ciprofloxacin (CIPRO) IVPB 400 mg     400 mg 200 mL/hr over 60 Minutes Intravenous  Once 09/18/16 1513 09/18/16 1754   09/18/16 1515  metroNIDAZOLE (FLAGYL) IVPB 500 mg     500 mg 100 mL/hr over 60 Minutes Intravenous  Once 09/18/16 1513 09/18/16 1650       Subjective:  Minimal oral intake.  Taking a lot of encouragement from family and staff to get her to even have sips of fluid.  Still having abdominal pain and waves of nausea without vomiting.    Objective: Vitals:   09/20/16 2138 09/21/16 0557 09/21/16 1135 09/21/16 1215  BP: (!) 107/51 (!) 99/55 (!) 117/45 (!) 101/47  Pulse: 76 Marland Kitchen)  56 67 80  Resp: 18 16 16 16   Temp: 99.3 F (37.4 C) 98.2 F (36.8 C) 97.8 F (36.6 C)   TempSrc: Oral Oral Axillary   SpO2: 100% 100% 100% 96%  Weight:      Height:        Intake/Output Summary (Last 24 hours) at 09/21/16 1445 Last data filed at 09/21/16 1149  Gross per 24 hour  Intake          2078.25 ml  Output                0 ml  Net          2078.25 ml   Filed Weights   09/19/16 0311  Weight: 62.1 kg (137 lb)    Examination:  General exam:  Adult Female.  No acute distress.  HEENT:  NCAT,  MMM Respiratory system: Clear to auscultation bilaterally Cardiovascular system: Regular rate and rhythm, normal S1/S2. No murmurs, rubs, gallops or clicks.  Warm extremities Gastrointestinal system:  Normal active bowel sounds, mildly distended, decreased pain in the left lower quadrant.  Guarding has improved.  No referred pain.  MSK:  Normal tone and bulk, no lower extremity edema Neuro:  Grossly intact    Data Reviewed: I have personally reviewed following labs and imaging studies  CBC:  Recent Labs Lab 09/18/16 1017 09/18/16 1940 09/19/16 0926 09/21/16 0457  WBC 16.0* 17.6* 13.2* 9.2  NEUTROABS 14.6*  --  11.0*  --   HGB 10.0* 9.3* 9.5* 7.9*  HCT 31.9* 29.7* 29.8* 25.8*  MCV 74.4* 74.3* 74.5* 73.9*  PLT 357 315 314 [id number]   Basic Metabolic Panel:  Recent Labs Lab 09/18/16 1017 09/18/16 1940 09/19/16 0926 09/21/16 0457  NA 138  --  138 135  K 4.3  --  4.0 4.1  CL 107  --  109 112*  CO2 20*  --  20* 19*  GLUCOSE 89  --  66 215*  BUN 47*  --  39* 22*  CREATININE 2.51* 2.04* 1.98* 1.55*  CALCIUM 9.6  --  9.0 8.4*   GFR: Estimated Creatinine Clearance: 20.4 mL/min (by C-G formula based on SCr of 1.55 mg/dL (H)). Liver Function Tests:  Recent Labs Lab 09/18/16 1017  AST 15  ALT 9*  ALKPHOS 60  BILITOT 1.1  PROT 7.8  ALBUMIN 3.1*    Recent Labs Lab 09/18/16 1017  LIPASE 14   No results for input(s): AMMONIA in the last 168 hours. Coagulation Profile: No results for input(s): INR, PROTIME in the last 168 hours. Cardiac Enzymes: No results for input(s): CKTOTAL, CKMB, CKMBINDEX, TROPONINI in the last 168 hours. BNP (last 3 results) No results for input(s): PROBNP in the last 8760 hours. HbA1C: No results for input(s): HGBA1C in the last 72 hours. CBG:  Recent Labs Lab 09/20/16 1200 09/20/16 1647 09/20/16 2148 09/21/16 0741 09/21/16 1129  GLUCAP 173* 253* 201* 201* 195*   Lipid Profile: No results for input(s): CHOL, HDL, LDLCALC, TRIG,  CHOLHDL, LDLDIRECT in the last 72 hours. Thyroid Function Tests: No results for input(s): TSH, T4TOTAL, FREET4, T3FREE, THYROIDAB in the last 72 hours. Anemia Panel:  Recent Labs  09/21/16 0811  VITAMINB12 890  FOLATE 4.1*   Urine analysis:    Component Value Date/Time   COLORURINE YELLOW 09/18/2016 1140   APPEARANCEUR CLEAR 09/18/2016 1140   LABSPEC 1.014 09/18/2016 1140   PHURINE 5.0 09/18/2016 1140   GLUCOSEU NEGATIVE 09/18/2016 1140   GLUCOSEU NEGATIVE 11/15/2013 1134  HGBUR NEGATIVE 09/18/2016 1140   BILIRUBINUR NEGATIVE 09/18/2016 1140   KETONESUR 5 (A) 09/18/2016 1140   PROTEINUR NEGATIVE 09/18/2016 1140   UROBILINOGEN 0.2 11/15/2013 1134   NITRITE NEGATIVE 09/18/2016 1140   LEUKOCYTESUR NEGATIVE 09/18/2016 1140   Sepsis Labs: @LABRCNTIP (procalcitonin:4,lacticidven:4)  ) Recent Results (from the past 240 hour(s))  C difficile quick scan w PCR reflex     Status: None   Collection Time: 09/18/16  4:08 PM  Result Value Ref Range Status   C Diff antigen NEGATIVE NEGATIVE Final   C Diff toxin NEGATIVE NEGATIVE Final   C Diff interpretation No C. difficile detected.  Final  MRSA PCR Screening     Status: None   Collection Time: 09/18/16  7:58 PM  Result Value Ref Range Status   MRSA by PCR NEGATIVE NEGATIVE Final    Comment:        The GeneXpert MRSA Assay (FDA approved for NASAL specimens only), is one component of a comprehensive MRSA colonization surveillance program. It is not intended to diagnose MRSA infection nor to guide or monitor treatment for MRSA infections.       Radiology Studies: No results found.   Scheduled Meds: . aspirin  81 mg Oral Daily  . ciprofloxacin  400 mg Intravenous Q24H  . dorzolamide  1 drop Both Eyes TID  . famotidine  20 mg Oral Daily  . feeding supplement  1 Container Oral TID BM  . insulin aspart  0-9 Units Subcutaneous TID WC  . latanoprost  1 drop Both Eyes QHS  . metronidazole  500 mg Intravenous Q8H  .  mirtazapine  15 mg Oral QHS  . ondansetron (ZOFRAN) IV  8 mg Intravenous Q8H   Continuous Infusions: . dextrose 5 % and 0.45 % NaCl with KCl 20 mEq/L 75 mL/hr at 09/21/16 0528     LOS: 3 days    Time spent: 30 min    Janece Canterbury, MD [facility] Pager [phone]  If 7PM-7AM, please contact night-coverage [email or web address] Password [email or web address] 09/21/2016, 2:45 PM

## 2016-09-21 NOTE — Evaluation (Signed)
Physical Therapy Evaluation Patient Details Name: Allison Rodgers MRN: [id number] DOB: 1924-04-07 Today's Date: 09/21/2016   History of Present Illness  83 yo female admitted with diverticultis. Hx of DM, DJD  Clinical Impression  On eval, pt was Min guard assist for mobility. She walked ~175 feet with a RW. Pt tolerated activity well. Son present during eval. Plan is for pt to return to ALF at discharge. Recommend HHPT follow up if pt and family are agreeable.     Follow Up Recommendations Home health PT (at ALF if pt/family is agreeable)    Equipment Recommendations  None recommended by PT    Recommendations for Other Services       Precautions / Restrictions Precautions Precautions: None Restrictions Weight Bearing Restrictions: No      Mobility  Bed Mobility               General bed mobility comments: oob in recliner  Transfers Overall transfer level: Needs assistance Equipment used: Rolling walker (2 wheeled) Transfers: Sit to/from Stand Sit to Stand: Min guard         General transfer comment: close guard for safety.   Ambulation/Gait Ambulation/Gait assistance: Min guard Ambulation Distance (Feet): 175 Feet Assistive device: Rolling walker (2 wheeled) Gait Pattern/deviations: Step-through pattern;Decreased stride length     General Gait Details: close guard for safety. slow gait speed.   Stairs            Wheelchair Mobility    Modified Rankin (Stroke Patients Only)       Balance                                             Pertinent Vitals/Pain Pain Assessment: No/denies pain    Home Living Family/patient expects to be discharged to:: Assisted living               Home Equipment: Walker - 2 wheels      Prior Function Level of Independence: Independent with assistive device(s)         Comments: per son, pt was Mod Ind up until recently.      Hand Dominance        Extremity/Trunk Assessment    Upper Extremity Assessment Upper Extremity Assessment: Generalized weakness    Lower Extremity Assessment Lower Extremity Assessment: Generalized weakness    Cervical / Trunk Assessment Cervical / Trunk Assessment: Normal  Communication   Communication: No difficulties  Cognition Arousal/Alertness: Awake/alert Behavior During Therapy: WFL for tasks assessed/performed Overall Cognitive Status: Within Functional Limits for tasks assessed                      General Comments      Exercises     Assessment/Plan    PT Assessment Patient needs continued PT services  PT Problem List Decreased strength;Decreased mobility;Decreased balance;Decreased activity tolerance;Decreased knowledge of use of DME          PT Treatment Interventions Gait training;Therapeutic activities;Therapeutic exercise;Functional mobility training;Balance training;Patient/family education    PT Goals (Current goals can be found in the Care Plan section)  Acute Rehab PT Goals Patient Stated Goal: return to ALF PT Goal Formulation: With patient Time For Goal Achievement: 10/05/16 Potential to Achieve Goals: Good    Frequency Min 2X/week   Barriers to discharge        Co-evaluation  End of Session Equipment Utilized During Treatment: Gait belt Activity Tolerance: Patient tolerated treatment well Patient left: in chair;with call bell/phone within reach;with family/visitor present;with chair alarm set           Time: [id number] PT Time Calculation (min) (ACUTE ONLY): 16 min   Charges:   PT Evaluation $PT Eval Low Complexity: 1 Procedure     PT G Codes:        Weston Anna, MPT Pager: [phone]

## 2016-09-22 DIAGNOSIS — Z794 Long term (current) use of insulin: Secondary | ICD-10-CM

## 2016-09-22 DIAGNOSIS — N184 Chronic kidney disease, stage 4 (severe): Secondary | ICD-10-CM

## 2016-09-22 DIAGNOSIS — E43 Unspecified severe protein-calorie malnutrition: Secondary | ICD-10-CM | POA: Insufficient documentation

## 2016-09-22 DIAGNOSIS — K5732 Diverticulitis of large intestine without perforation or abscess without bleeding: Secondary | ICD-10-CM | POA: Diagnosis not present

## 2016-09-22 DIAGNOSIS — E46 Unspecified protein-calorie malnutrition: Secondary | ICD-10-CM

## 2016-09-22 DIAGNOSIS — E11649 Type 2 diabetes mellitus with hypoglycemia without coma: Secondary | ICD-10-CM | POA: Diagnosis not present

## 2016-09-22 DIAGNOSIS — N179 Acute kidney failure, unspecified: Secondary | ICD-10-CM | POA: Diagnosis not present

## 2016-09-22 LAB — BASIC METABOLIC PANEL
Anion gap: 4 — ABNORMAL LOW (ref 5–15)
BUN: 17 mg/dL (ref 6–20)
CALCIUM: 8.5 mg/dL — AB (ref 8.9–10.3)
CO2: 20 mmol/L — AB (ref 22–32)
CREATININE: 1.54 mg/dL — AB (ref 0.44–1.00)
Chloride: 113 mmol/L — ABNORMAL HIGH (ref 101–111)
GFR calc Af Amer: 33 mL/min — ABNORMAL LOW (ref >60)
GFR calc non Af Amer: 28 mL/min — ABNORMAL LOW (ref >60)
Glucose, Bld: 206 mg/dL — ABNORMAL HIGH (ref 65–99)
Potassium: 4.1 mmol/L (ref 3.5–5.1)
Sodium: 137 mmol/L (ref 135–145)

## 2016-09-22 LAB — GLUCOSE, CAPILLARY
Glucose-Capillary: 197 mg/dL — ABNORMAL HIGH (ref 65–99)
Glucose-Capillary: 227 mg/dL — ABNORMAL HIGH (ref 65–99)

## 2016-09-22 LAB — CBC
HEMATOCRIT: 25.1 % — AB (ref 36.0–46.0)
Hemoglobin: 8 g/dL — ABNORMAL LOW (ref 12.0–15.0)
MCH: 23.6 pg — ABNORMAL LOW (ref 26.0–34.0)
MCHC: 31.9 g/dL (ref 30.0–36.0)
MCV: 74 fL — ABNORMAL LOW (ref 78.0–100.0)
Platelets: 266 10*3/uL (ref 150–400)
RBC: 3.39 MIL/uL — ABNORMAL LOW (ref 3.87–5.11)
RDW: 15.2 % (ref 11.5–15.5)
WBC: 7.4 10*3/uL (ref 4.0–10.5)

## 2016-09-22 MED ORDER — BOOST / RESOURCE BREEZE PO LIQD: 1.0000 | Freq: Three times a day (TID) | ORAL | 0 refills | Status: DC

## 2016-09-22 MED ORDER — METRONIDAZOLE 500 MG PO TABS: 500.0000 mg | ORAL_TABLET | Freq: Three times a day (TID) | ORAL | 0 refills | Status: DC

## 2016-09-22 MED ORDER — MIRTAZAPINE 7.5 MG PO TABS: 7.5000 mg | ORAL_TABLET | Freq: Every day | ORAL | 0 refills | Status: DC

## 2016-09-22 MED ORDER — FOLIC ACID 1 MG PO TABS: 1.0000 mg | ORAL_TABLET | Freq: Every day | ORAL | 0 refills | Status: DC

## 2016-09-22 MED ORDER — CIPROFLOXACIN HCL 250 MG PO TABS: 250.0000 mg | ORAL_TABLET | Freq: Two times a day (BID) | ORAL | 0 refills | Status: DC

## 2016-09-22 NOTE — Progress Notes (Signed)
Initial Nutrition Assessment  DOCUMENTATION CODES:   Severe malnutrition in context of acute illness/injury  INTERVENTION:   Continue Ensure Enlive po BID, each supplement provides 350 kcal and 20 grams of protein  NUTRITION DIAGNOSIS:   Inadequate oral intake related to poor appetite, lethargy/confusion as evidenced by per patient/family report.  GOAL:   Patient will meet greater than or equal to 90% of their needs  MONITOR:   PO intake, Supplement acceptance, Labs, Weight trends, I & O's  REASON FOR ASSESSMENT:   Consult Assessment of nutrition requirement/status  ASSESSMENT:   83 year old female with history of diabetes mellitus type 2, hypertension, severe diverticulosis, and GERD who presented to the emergency department after she was febrile and confused at her skilled nursing facility. She been having some left lower quadrant abdominal pain and watery diarrhea with vomiting. CT scan of abdomen and pelvis demonstrated severe diverticulosis with signs of diverticulitis, white blood cell count 16 with acute on chronic kidney injury.   Noted discharge summary placed for today.  Pt in room with son at bedside. Pt unable to provide any history given confusion. Pt's son states that the pt's appetite began to decrease a few months PTA but in the last 2 weeks he has noted a considerable decrease in intakes. He states the pt will consume a few bites per meal and is not eating the snacks he brings for her at her facility (peaches, apples, pudding). Pt has been subsisting mainly on Ensure supplements.   Per chart review ,pt has lost 13 lb since 1/4 (9% wt loss x 1 month, significant for time frame).   Medications: Folic acid tablet daily, Remeron tablet daily, IV Zofran every 8 hours Labs reviewed: CBGs: 197-227 GFR: 33 Folate:4.1  Diet Order:  Diet regular Room service appropriate? Yes; Fluid consistency: Thin  Skin:  Reviewed, no issues  Last BM:  2/9  Height:   Ht  Readings from Last 1 Encounters:  09/19/16 5\' 4"  (1.626 m)    Weight:   Wt Readings from Last 1 Encounters:  09/19/16 137 lb (62.1 kg)    Ideal Body Weight:  54.5 kg  BMI:  Body mass index is 23.52 kg/m.  Estimated Nutritional Needs:   Kcal:  1550-1750  Protein:  70-80g  Fluid:  1.6-1.8L/day  EDUCATION NEEDS:   No education needs identified at this time  Clayton Bibles, MS, RD, LDN Pager: [phone] After Hours Pager: [phone]

## 2016-09-22 NOTE — Progress Notes (Signed)
Assessment unchanged. Discharge instructions reviewed with son. Pt discharging to [place] center this afternoon. Son plans to transport pt to facility via personal vehicle. Social worker communicated with [employer] whose representative reported a phoned report not necessary form nurse. Discharge via wc to front entrance to meet son.

## 2016-09-22 NOTE — Discharge Summary (Signed)
Physician Discharge Summary  Allison Rodgers [id number] DOB: 1924/06/27 DOA: 09/18/2016  PCP: Sande Brothers, MD  Admit date: 09/18/2016 Discharge date: 09/22/2016  Admitted From: [place]  Disposition:  [facility]   Recommendations for Outpatient Follow-up:  1. Palliative care consult  2. Gastroenterology, Dr. Ardis Hughs in 2 weeks 3. Endocrinology, Dr. Dwyane Dee in 3-4 weeks to resume insulin if CBGs are high and oral intake has improved 4. Continue ciprofloxacin and flagyl through 10/03/2016 5. Trial of mirtazapine 7.5mg  nightly to increase to 15mg  nightly in 1 week if no intolerable side effects.  If no improvement in appetite in 1 month, consider stopping 6. Repeat BMP in one week.  Weight check also.  Resume lasix +/- aldactone if signs of volume overload   Home Health:  PT  Equipment/Devices:  Already has rolling walker  Discharge Condition:  Stable, improved CODE STATUS:  DNR  Diet recommendation:  Regular with supplements   Brief/Interim Summary:  The patient is a 83 year old female with history of diabetes mellitus type 2, hypertension, severe diverticulosis, and GERD who presented to the emergency department after she was febrile and confused at her ALF memory care unit. She had been having some left lower quadrant abdominal pain and watery diarrhea with vomiting. CT scan of abdomen and pelvis demonstrated severe diverticulosis with signs of diverticulitis, white blood cell count 16 with acute on chronic kidney injury. She was started on broad-spectrum antibiotics and IV fluids and gradually improved.    Discharge Diagnoses:  Active Problems:   Diabetes mellitus type 2, controlled ([place])   Chronic kidney disease, stage IV (severe) (HCC)   Diverticulitis   AKI (acute kidney injury) ([place])   Protein-calorie malnutrition ([place])  Diverticulitis - Hx of severe diverticulosis last colonoscopy on 2009 - CT showed signs of inflammation consistent with sigmoid diverticulitis,  elevated WBC and fever.    -  Started on Cipro/Flagyl  -  given scheduled zofran with prn reglan  Moderate protein calorie malnutrition (has lost 51-lbs over the last year and lost interest in eating) -  Minimal oral intake during her first few days -  Stared mirtazapine as appetite stimulant, to titrate up to 15mg  nightly if tolerating -  If no improvement in appetite in 1 month, please discontinue mirtazapine -  Educated family about the natural progression of dementia  Acute on CKD stage III - likely 2/2 dehydration from diarrhea, and continuation of diuretics, creatinine trended down from 2.51 to baseline of 1.54 with IVF -  Still at risk for dehydration due to poor oral intake -  Continue to hold Lasix and aldactone and reevaluate in 1 week  DM type II - well controlled last A1C 5.5 - seen by Endocrinology on 08/15/16, hypoglycemia has resolved with reduction in SSI and administration of dextrose fluids.   -  Until she is eating more regularly, will discontinue lantus and aspart and liberalize diet -  Continue CBG checks -  Follow up with Dr. Dwyane Dee in 3-4 weeks to review CBGs and adjust insulin   HTN soft likely from dehydration and infection Held Lasix and Aldactone   Iron and folate deficiency anemia- stable, no signs of overt bleeding  Feraheme x 1 on 2/10 Started iron supplement  Started folic acid supplementation  Discharge Instructions     Medication List    STOP taking these medications   furosemide 20 MG tablet Commonly known as:  LASIX   guaifenesin 100 MG/5ML syrup Commonly known as:  ROBITUSSIN   insulin  glargine 100 UNIT/ML injection Commonly known as:  LANTUS   insulin lispro 100 UNIT/ML injection Commonly known as:  HUMALOG   lisinopril 5 MG tablet Commonly known as:  PRINIVIL,ZESTRIL   spironolactone 25 MG tablet Commonly known as:  ALDACTONE     TAKE these medications   acetaminophen 500 MG tablet Commonly known as:  TYLENOL Take 1  tablet (500 mg total) by mouth every 6 (six) hours as needed for moderate pain.   ALIGN 4 MG Caps Take 4 mg by mouth daily.   aspirin 81 MG chewable tablet Chew 81 mg by mouth daily.   cholecalciferol 400 units Tabs tablet Commonly known as:  VITAMIN D Take 800 Units by mouth daily.   ciprofloxacin 250 MG tablet Commonly known as:  CIPRO Take 1 tablet (250 mg total) by mouth 2 (two) times daily.   clotrimazole-betamethasone cream Commonly known as:  LOTRISONE Apply 1 application topically 2 (two) times daily.   Cranberry 500 MG Caps Take 500 mg by mouth 2 (two) times daily.   dorzolamide 2 % ophthalmic solution Commonly known as:  TRUSOPT Place 1 drop into both eyes 3 (three) times daily.   feeding supplement Liqd Take 1 Container by mouth 3 (three) times daily between meals.   folic acid 1 MG tablet Commonly known as:  FOLVITE Take 1 tablet (1 mg total) by mouth daily. Start taking on:  09/23/2016   loperamide 2 MG capsule Commonly known as:  IMODIUM Take 2 mg by mouth daily as needed for diarrhea or loose stools.   metroNIDAZOLE 500 MG tablet Commonly known as:  FLAGYL Take 1 tablet (500 mg total) by mouth 3 (three) times daily.   mirtazapine 7.5 MG tablet Commonly known as:  REMERON Take 1 tablet (7.5 mg total) by mouth at bedtime.   polyethylene glycol powder powder Commonly known as:  GLYCOLAX/MIRALAX Take 17 g by mouth 2 (two) times daily.   ranitidine 150 MG capsule Commonly known as:  ZANTAC Take 150 mg by mouth 2 (two) times daily.   sodium chloride 0.65 % Soln nasal spray Commonly known as:  OCEAN Place 2 sprays into both nostrils as needed for congestion. Reported on 02/14/2016   sorbitol 70 % solution Take 15 mLs by mouth daily as needed (constipation).   XALATAN 0.005 % ophthalmic solution Generic drug:  latanoprost Place 1 drop into both eyes at bedtime.      Follow-up Information    BOWEN,SAMUEL, MD. Schedule an appointment as soon as  possible for a visit in 1 week(s).   Specialty:  Internal Medicine       Milus Banister, MD. Schedule an appointment as soon as possible for a visit in 2 week(s).   Specialty:  Gastroenterology Contact information: [address] [place] [address] [phone]          Allergies  Allergen Reactions  . Penicillins Other (See Comments)    REACTION: causes hallucinations  . Sulfamethoxazole-Trimethoprim Swelling    REACTION: rash  . Bactrim     Unknown per mar  . Trimethoprim     Unknown per mar     Consultations: Gastroenterology, Dr. Ardis Hughs   Procedures/Studies: Ct Abdomen Pelvis Wo Contrast  Result Date: 09/18/2016 CLINICAL DATA:  Left lower quadrant abdominal pain. EXAM: CT ABDOMEN AND PELVIS WITHOUT CONTRAST TECHNIQUE: Multidetector CT imaging of the abdomen and pelvis was performed following the standard protocol without IV contrast. COMPARISON:  CT scan of May 03, 2016. FINDINGS: Lower chest: 6  mm nodule is noted in in lingular segment of left upper lobe. Multiple other smaller nodules are noted in the same area. Hepatobiliary: No focal liver abnormality is seen. Status post cholecystectomy. No biliary dilatation. Pancreas: Unremarkable. No pancreatic ductal dilatation or surrounding inflammatory changes. Spleen: Normal in size without focal abnormality. Adrenals/Urinary Tract: Adrenal glands are unremarkable. Kidneys are normal, without renal calculi, focal lesion, or hydronephrosis. Bladder is unremarkable. Stomach/Bowel: There is no evidence of bowel obstruction. There is again noted diverticulosis of the sigmoid colon with surrounding inflammatory changes most consistent with sigmoid diverticulitis. However, possible mass cannot be excluded. Vascular/Lymphatic: Aortic atherosclerosis. No enlarged abdominal or pelvic lymph nodes. Reproductive: Status post hysterectomy. No adnexal masses. Other: No abdominal wall hernia or abnormality. No abdominopelvic  ascites. Musculoskeletal: No acute or significant osseous findings. IMPRESSION: 6 mm nodule seen in left upper lobe. Multiple smaller nodules are noted in the adjacent area. Non-contrast chest CT at 3-6 months is recommended. If the nodules are stable at time of repeat CT, then future CT at 18-24 months (from today's scan) is considered optional for low-risk patients, but is recommended for high-risk patients. This recommendation follows the consensus statement: Guidelines for Management of Incidental Pulmonary Nodules Detected on CT Images: From the Fleischner Society 2017; Radiology 2017; 284:228-243. Diverticulosis of sigmoid colon is noted with wall thickening and surrounding inflammation most consistent with sigmoid diverticulitis. However, the possibility of mass or neoplasm cannot be excluded given the amount of wall thickening present, and sigmoidoscopy is recommended for further evaluation. Electronically Signed   By: Marijo Conception, M.D.   On: 09/18/2016 14:20   Dg Chest 2 View  Result Date: 09/18/2016 CLINICAL DATA:  Fever EXAM: CHEST  2 VIEW COMPARISON:  02/11/2015 FINDINGS: Patchy nodular opacities are noted in the left upper lobe. This could reflect nodular infiltrate/pneumonia. Right lung is clear. Heart is normal size. No effusions or acute bony abnormality. IMPRESSION: Patchy nodular airspace opacities in the left upper lobe, likely pneumonia. Followup PA and lateral chest X-ray is recommended in 3-4 weeks following trial of antibiotic therapy to ensure resolution and exclude underlying malignancy. Electronically Signed   By: Rolm Baptise M.D.   On: 09/18/2016 10:46    Subjective: Still having nausea but no vomiting.  Cannot remember what she had to eat yesterday or today.  Initially told me she was not hungry but two minutes later told me she would like a chocolate shake.    Discharge Exam: Vitals:   09/21/16 2125 09/22/16 0554  BP: (!) 106/48 (!) 106/49  Pulse: (!) 58 (!) 57  Resp:  16 16  Temp: 97.6 F (36.4 C) 98.5 F (36.9 C)   Vitals:   09/21/16 1215 09/21/16 1458 09/21/16 2125 09/22/16 0554  BP: (!) 101/47 (!) 96/48 (!) 106/48 (!) 106/49  Pulse: 80 61 (!) 58 (!) 57  Resp: 16 16 16 16   Temp:  97.5 F (36.4 C) 97.6 F (36.4 C) 98.5 F (36.9 C)  TempSrc:  Axillary Oral Other (Comment)  SpO2: 96% 100% 100% 100%  Weight:      Height:        General exam:  Adult Female.  No acute distress.  Lying in bed HEENT:  NCAT, MMM Respiratory system: Clear to auscultation bilaterally Cardiovascular system: Regular rate and rhythm, normal S1/S2. No murmurs, rubs, gallops or clicks.  Warm extremities Gastrointestinal system:  Normal active bowel sounds, mildly distended, persistent pain in the left lower quadrant.  Also has some pain in the  epigastric area over a distended loop of bowel.  Guarding has improved.  No referred pain.  MSK:  Normal tone and bulk, no lower extremity edema Neuro:  Grossly intact    The results of significant diagnostics from this hospitalization (including imaging, microbiology, ancillary and laboratory) are listed below for reference.     Microbiology: Recent Results (from the past 240 hour(s))  C difficile quick scan w PCR reflex     Status: None   Collection Time: 09/18/16  4:08 PM  Result Value Ref Range Status   C Diff antigen NEGATIVE NEGATIVE Final   C Diff toxin NEGATIVE NEGATIVE Final   C Diff interpretation No C. difficile detected.  Final  MRSA PCR Screening     Status: None   Collection Time: 09/18/16  7:58 PM  Result Value Ref Range Status   MRSA by PCR NEGATIVE NEGATIVE Final    Comment:        The GeneXpert MRSA Assay (FDA approved for NASAL specimens only), is one component of a comprehensive MRSA colonization surveillance program. It is not intended to diagnose MRSA infection nor to guide or monitor treatment for MRSA infections.      Labs: BNP (last 3 results) No results for input(s): BNP in the last  8760 hours. Basic Metabolic Panel:  Recent Labs Lab 09/18/16 1017 09/18/16 1940 09/19/16 0926 09/21/16 0457 09/22/16 0423  NA 138  --  138 135 137  K 4.3  --  4.0 4.1 4.1  CL 107  --  109 112* 113*  CO2 20*  --  20* 19* 20*  GLUCOSE 89  --  66 215* 206*  BUN 47*  --  39* 22* 17  CREATININE 2.51* 2.04* 1.98* 1.55* 1.54*  CALCIUM 9.6  --  9.0 8.4* 8.5*   Liver Function Tests:  Recent Labs Lab 09/18/16 1017  AST 15  ALT 9*  ALKPHOS 60  BILITOT 1.1  PROT 7.8  ALBUMIN 3.1*    Recent Labs Lab 09/18/16 1017  LIPASE 14   No results for input(s): AMMONIA in the last 168 hours. CBC:  Recent Labs Lab 09/18/16 1017 09/18/16 1940 09/19/16 0926 09/21/16 0457 09/22/16 0423  WBC 16.0* 17.6* 13.2* 9.2 7.4  NEUTROABS 14.6*  --  11.0*  --   --   HGB 10.0* 9.3* 9.5* 7.9* 8.0*  HCT 31.9* 29.7* 29.8* 25.8* 25.1*  MCV 74.4* 74.3* 74.5* 73.9* 74.0*  PLT 357 315 314 264 266   Cardiac Enzymes: No results for input(s): CKTOTAL, CKMB, CKMBINDEX, TROPONINI in the last 168 hours. BNP: Invalid input(s): POCBNP CBG:  Recent Labs Lab 09/21/16 0741 09/21/16 1129 09/21/16 1613 09/22/16 0723 09/22/16 1122  GLUCAP 201* 195* 258* 197* 227*   D-Dimer No results for input(s): DDIMER in the last 72 hours. Hgb A1c No results for input(s): HGBA1C in the last 72 hours. Lipid Profile No results for input(s): CHOL, HDL, LDLCALC, TRIG, CHOLHDL, LDLDIRECT in the last 72 hours. Thyroid function studies No results for input(s): TSH, T4TOTAL, T3FREE, THYROIDAB in the last 72 hours.  Invalid input(s): FREET3 Anemia work up  Recent Labs  09/21/16 0811  VITAMINB12 890  FOLATE 4.1*   Urinalysis    Component Value Date/Time   COLORURINE YELLOW 09/18/2016 [place] 09/18/2016 1140   LABSPEC 1.014 09/18/2016 1140   PHURINE 5.0 09/18/2016 1140   GLUCOSEU NEGATIVE 09/18/2016 [place] 11/15/2013 1134   HGBUR NEGATIVE 09/18/2016 [place] 09/18/2016  1140   KETONESUR 5 (A) 09/18/2016 1140   PROTEINUR NEGATIVE 09/18/2016 1140   UROBILINOGEN 0.2 11/15/2013 1134   NITRITE NEGATIVE 09/18/2016 1140   LEUKOCYTESUR NEGATIVE 09/18/2016 1140   Sepsis Labs Invalid input(s): PROCALCITONIN,  WBC,  LACTICIDVEN   Time coordinating discharge: Over 30 minutes  SIGNED:   Janece Canterbury, MD  [facility] 09/22/2016, 1:03 PM Pager   If 7PM-7AM, please contact night-coverage [email or web address] Password [email or web address]

## 2016-09-22 NOTE — Progress Notes (Signed)
CSW informed pt is ready for DC back to [facility] contacted facility and faxed DC and fl2- paperwork reviewed by RN and pt approved for return  Patient will discharge to [facility] Anticipated discharge date: 2/11 Family notified: at bedside Transportation by family  ALF informed CSW that we do not need to call report for patient return- RN to give patient new medications prescriptions for ALF  CSW signing off.  Jorge Ny, LCSW Clinical Social Worker [phone]

## 2016-09-25 ENCOUNTER — Ambulatory Visit: Payer: Medicare Other | Admitting: Podiatry

## 2016-09-29 ENCOUNTER — Encounter (HOSPITAL_COMMUNITY): Payer: Self-pay

## 2016-09-29 ENCOUNTER — Emergency Department (HOSPITAL_COMMUNITY): Payer: Medicare Other

## 2016-09-29 ENCOUNTER — Emergency Department (HOSPITAL_COMMUNITY)
Admission: EM | Admit: 2016-09-29 | Discharge: 2016-09-29 | Disposition: A | Discharge status: Home / Self Care | Payer: Medicare Other | Attending: Emergency Medicine | Admitting: Emergency Medicine

## 2016-09-29 DIAGNOSIS — I129 Hypertensive chronic kidney disease with stage 1 through stage 4 chronic kidney disease, or unspecified chronic kidney disease: Secondary | ICD-10-CM | POA: Diagnosis not present

## 2016-09-29 DIAGNOSIS — R52 Pain, unspecified: Secondary | ICD-10-CM

## 2016-09-29 DIAGNOSIS — N184 Chronic kidney disease, stage 4 (severe): Secondary | ICD-10-CM | POA: Insufficient documentation

## 2016-09-29 DIAGNOSIS — E1122 Type 2 diabetes mellitus with diabetic chronic kidney disease: Secondary | ICD-10-CM | POA: Insufficient documentation

## 2016-09-29 DIAGNOSIS — M25551 Pain in right hip: Secondary | ICD-10-CM | POA: Diagnosis not present

## 2016-09-29 DIAGNOSIS — G8929 Other chronic pain: Secondary | ICD-10-CM | POA: Insufficient documentation

## 2016-09-29 DIAGNOSIS — Z79899 Other long term (current) drug therapy: Secondary | ICD-10-CM | POA: Insufficient documentation

## 2016-09-29 DIAGNOSIS — Z7982 Long term (current) use of aspirin: Secondary | ICD-10-CM | POA: Insufficient documentation

## 2016-09-29 DIAGNOSIS — W19XXXA Unspecified fall, initial encounter: Secondary | ICD-10-CM | POA: Diagnosis not present

## 2016-09-29 DIAGNOSIS — R109 Unspecified abdominal pain: Secondary | ICD-10-CM | POA: Diagnosis present

## 2016-09-29 DIAGNOSIS — F0390 Unspecified dementia without behavioral disturbance: Secondary | ICD-10-CM | POA: Diagnosis not present

## 2016-09-29 DIAGNOSIS — Y999 Unspecified external cause status: Secondary | ICD-10-CM | POA: Insufficient documentation

## 2016-09-29 DIAGNOSIS — Y929 Unspecified place or not applicable: Secondary | ICD-10-CM | POA: Diagnosis not present

## 2016-09-29 DIAGNOSIS — K5732 Diverticulitis of large intestine without perforation or abscess without bleeding: Secondary | ICD-10-CM | POA: Diagnosis not present

## 2016-09-29 DIAGNOSIS — Z794 Long term (current) use of insulin: Secondary | ICD-10-CM | POA: Diagnosis not present

## 2016-09-29 DIAGNOSIS — Y939 Activity, unspecified: Secondary | ICD-10-CM | POA: Diagnosis not present

## 2016-09-29 LAB — COMPREHENSIVE METABOLIC PANEL
ALT: 9 U/L — AB (ref 14–54)
AST: 23 U/L (ref 15–41)
Albumin: 3.1 g/dL — ABNORMAL LOW (ref 3.5–5.0)
Alkaline Phosphatase: 52 U/L (ref 38–126)
Anion gap: 6 (ref 5–15)
BILIRUBIN TOTAL: 0.8 mg/dL (ref 0.3–1.2)
BUN: 18 mg/dL (ref 6–20)
CALCIUM: 9.6 mg/dL (ref 8.9–10.3)
CO2: 23 mmol/L (ref 22–32)
CREATININE: 1.66 mg/dL — AB (ref 0.44–1.00)
Chloride: 112 mmol/L — ABNORMAL HIGH (ref 101–111)
GFR calc Af Amer: 30 mL/min — ABNORMAL LOW (ref >60)
GFR, EST NON AFRICAN AMERICAN: 26 mL/min — AB (ref >60)
Glucose, Bld: 87 mg/dL (ref 65–99)
POTASSIUM: 3.7 mmol/L (ref 3.5–5.1)
Sodium: 141 mmol/L (ref 135–145)
TOTAL PROTEIN: 7.3 g/dL (ref 6.5–8.1)

## 2016-09-29 LAB — I-STAT CHEM 8, ED
BUN: 18 mg/dL (ref 6–20)
CHLORIDE: 111 mmol/L (ref 101–111)
CREATININE: 1.8 mg/dL — AB (ref 0.44–1.00)
Calcium, Ion: 1.33 mmol/L (ref 1.15–1.40)
Glucose, Bld: 84 mg/dL (ref 65–99)
HCT: 33 % — ABNORMAL LOW (ref 36.0–46.0)
HEMOGLOBIN: 11.2 g/dL — AB (ref 12.0–15.0)
POTASSIUM: 3.7 mmol/L (ref 3.5–5.1)
Sodium: 145 mmol/L (ref 135–145)
TCO2: 26 mmol/L (ref 0–100)

## 2016-09-29 LAB — CBC WITH DIFFERENTIAL/PLATELET
BASOS ABS: 0 10*3/uL (ref 0.0–0.1)
Basophils Relative: 0 %
Eosinophils Absolute: 0 10*3/uL (ref 0.0–0.7)
Eosinophils Relative: 0 %
HEMATOCRIT: 31.2 % — AB (ref 36.0–46.0)
Hemoglobin: 9.6 g/dL — ABNORMAL LOW (ref 12.0–15.0)
LYMPHS PCT: 12 %
Lymphs Abs: 1.1 10*3/uL (ref 0.7–4.0)
MCH: 22.9 pg — ABNORMAL LOW (ref 26.0–34.0)
MCHC: 30.8 g/dL (ref 30.0–36.0)
MCV: 74.5 fL — AB (ref 78.0–100.0)
MONO ABS: 0.6 10*3/uL (ref 0.1–1.0)
Monocytes Relative: 7 %
NEUTROS ABS: 7.4 10*3/uL (ref 1.7–7.7)
Neutrophils Relative %: 80 %
Platelets: 238 10*3/uL (ref 150–400)
RBC: 4.19 MIL/uL (ref 3.87–5.11)
RDW: 16.5 % — AB (ref 11.5–15.5)
WBC: 9.3 10*3/uL (ref 4.0–10.5)

## 2016-09-29 LAB — LIPASE, BLOOD: LIPASE: 12 U/L (ref 11–51)

## 2016-09-29 MED ORDER — IOPAMIDOL (ISOVUE-300) INJECTION 61%
INTRAVENOUS | Status: AC
  Filled 2016-09-29: qty 100

## 2016-09-29 MED ORDER — SODIUM CHLORIDE 0.9 % IJ SOLN
INTRAMUSCULAR | Status: AC
  Filled 2016-09-29: qty 50

## 2016-09-29 NOTE — ED Notes (Signed)
Bed: WA21 Expected date:  Expected time:  Means of arrival:  Comments: fall 

## 2016-09-29 NOTE — ED Provider Notes (Signed)
[facility] DEPT Provider Note   CSN: [id number] Arrival date & time: 09/29/16  1040     History   Chief Complaint Chief Complaint  Patient presents with  . Fall  . Hip Pain    HPI Allison Rodgers is a 83 y.o. female.  83 yo F with a chief complaints of a fall. Per the nursing home the patient had fallen and was found on the ground. The patient things that she tripped over a chair. She is unsure when the fall happened. Level 5 caveat dementia. Per the family she has had chronic abdominal pain though when I'm talking with him later he says she was just diagnosed with acute diverticulitis and he feels that she has not been the same since she was discharged from the hospital. Feels that her symptoms of been worsening. Denies vomiting denies diarrhea.   The history is provided by the patient.  Fall  This is a new problem. The current episode started 2 days ago. The problem occurs constantly. The problem has not changed since onset.Associated symptoms include abdominal pain. Pertinent negatives include no chest pain, no headaches and no shortness of breath. Nothing aggravates the symptoms. Nothing relieves the symptoms. She has tried nothing for the symptoms. The treatment provided no relief.  Hip Pain  Associated symptoms include abdominal pain. Pertinent negatives include no chest pain, no headaches and no shortness of breath.    Past Medical History:  Diagnosis Date  . Arthritis   . Bacterial meningitis   . 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 Chronic abdominal pain   . Diverticulosis   . DJD (degenerative joint disease)   . DM (diabetes mellitus) ([place])   . Dyslipidemia   . Edema   . Esophageal stricture   . Gastritis, chronic   . GERD (gastroesophageal reflux disease)   . Hiatal hernia   . Hx of adenomatous colonic polyps   . Hypertension   . Hypertensive cardiovascular disease   . Microcytic anemia   . Obesity   . Renal disorder      Patient Active Problem List   Diagnosis Date Noted  . Protein-calorie malnutrition, severe 09/22/2016  . AKI (acute kidney injury) ([place]) 09/21/2016  . Protein-calorie malnutrition ([place]) 09/21/2016  . Diverticulitis 09/18/2016  . Moderate dementia 10/31/2014  . Health care maintenance 02/18/2014  . Other malaise and fatigue 11/22/2013  . Vaginal discharge 11/22/2013  . Gastroparesis 08/23/2013  . Abdominal pain, other specified site 07/13/2013  . Chronic renal failure 05/29/2013  . Volume depletion 05/29/2013  . Type II or unspecified type diabetes mellitus with renal manifestations, uncontrolled(250.42) 05/10/2013  . 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 Hyponatremia 05/10/2013  . Glaucoma 05/10/2013  . Type II or unspecified type diabetes mellitus with neurological manifestations, uncontrolled(250.62) 04/20/2013  . Hypokalemia 04/06/2013  . Candida rash of groin 04/06/2013  . Acute encephalopathy 04/02/2013  . Meningitis 04/02/2013  . Chronic kidney disease, stage IV (severe) ([place]) 04/02/2013  . BENIGN NEOPLASM [place] DIGESTIVE SYSTEM 01/11/2008  . ESOPHAGEAL STRICTURE 01/11/2008  . GERD 10/29/2007  . Diabetes mellitus type 2, controlled ([place]) 06/24/2007  . Morbid obesity ([place]) 06/24/2007  . HYPERTENSION 06/24/2007    Past Surgical History:  Procedure Laterality Date  . APPENDECTOMY    . BACK SURGERY    . BILATERAL OOPHORECTOMY  1957  . CHOLECYSTECTOMY  1999  . HERNIA REPAIR  [id number]   umbilical and ventral hernia repair by Dr. Margot Chimes  .  ROTATOR CUFF REPAIR  7/05   right  . VESICOVAGINAL FISTULA CLOSURE W/ TAH  1957    OB History    No data available       Home Medications    Prior to Admission medications   Medication Sig Start Date End Date Taking? Authorizing Provider  acetaminophen (TYLENOL) 500 MG tablet Take 1 tablet (500 mg total) by mouth every 6 (six) hours as needed for moderate pain. 02/11/15  Yes Tori Milks, MD    aspirin 81 MG chewable tablet Chew 81 mg by mouth daily.     Yes Historical Provider, MD  cholecalciferol (VITAMIN D) 400 units TABS tablet Take 800 Units by mouth daily.   Yes Historical Provider, MD  ciprofloxacin (CIPRO) 250 MG tablet Take 1 tablet (250 mg total) by mouth 2 (two) times daily. 09/22/16  Yes Janece Canterbury, MD  Cranberry 500 MG CAPS Take 500 mg by mouth 2 (two) times daily.   Yes Historical Provider, MD  dorzolamide (TRUSOPT) 2 % ophthalmic solution Place 1 drop into both eyes 3 (three) times daily.    Yes Historical Provider, MD  feeding supplement (BOOST / RESOURCE BREEZE) LIQD Take 1 Container by mouth 3 (three) times daily between meals. 09/22/16  Yes Janece Canterbury, MD  fluocinonide cream (LIDEX) [id number] % Apply 1 application topically 2 (two) times daily as needed (For rash).   Yes Historical Provider, MD  folic acid (FOLVITE) 1 MG tablet Take 1 tablet (1 mg total) by mouth daily. 09/23/16  Yes Janece Canterbury, MD  LANTUS 100 UNIT/ML injection Inject 12 Units into the skin at bedtime.  06/27/16  Yes Historical Provider, MD  latanoprost (XALATAN) 0.005 % ophthalmic solution Place 1 drop into both eyes at bedtime.    Yes Historical Provider, MD  loperamide (IMODIUM) 2 MG capsule Take 2 mg by mouth daily as needed for diarrhea or loose stools.    Yes Historical Provider, MD  metroNIDAZOLE (FLAGYL) 500 MG tablet Take 1 tablet (500 mg total) by mouth 3 (three) times daily. 09/22/16  Yes Janece Canterbury, MD  mirtazapine (REMERON) 7.5 MG tablet Take 1 tablet (7.5 mg total) by mouth at bedtime. 09/22/16  Yes Janece Canterbury, MD  polyethylene glycol powder (GLYCOLAX/MIRALAX) powder Take 17 g by mouth 2 (two) times daily.  06/27/16  Yes Historical Provider, MD  Probiotic Product (ALIGN) 4 MG CAPS Take 4 mg by mouth daily.    Yes Historical Provider, MD  ranitidine (ZANTAC) 150 MG capsule Take 150 mg by mouth 2 (two) times daily.  11/01/15  Yes Historical Provider, MD  sodium chloride (OCEAN)  0.65 % SOLN nasal spray Place 2 sprays into both nostrils as needed for congestion. Reported on 02/14/2016   Yes Historical Provider, MD  sorbitol 70 % solution Take 15 mLs by mouth daily as needed (constipation).    Yes Historical Provider, MD    Family History Family History  Problem Relation Age of Onset  . Stroke Mother   . Cancer Father   . Hypertension Other     Social History Social History  Substance Use Topics  . Smoking status: Never Smoker  . Smokeless tobacco: Never Used  . Alcohol use No     Allergies   Penicillins; Sulfamethoxazole-trimethoprim; Bactrim; and Trimethoprim   Review of Systems Review of Systems  Constitutional: Negative for chills and fever.  HENT: Negative for congestion and rhinorrhea.   Eyes: Negative for redness and visual disturbance.  Respiratory: Negative for shortness of breath and wheezing.  Cardiovascular: Negative for chest pain and palpitations.  Gastrointestinal: Positive for abdominal pain. Negative for nausea and vomiting.  Genitourinary: Negative for dysuria and urgency.  Musculoskeletal: Positive for arthralgias and back pain. Negative for myalgias.  Skin: Negative for pallor and wound.  Neurological: Negative for dizziness and headaches.     Physical Exam Updated Vital Signs BP 148/83 (BP Location: Right Arm)   Pulse 81   Temp 98.5 F (36.9 C) (Oral)   Resp 14   Ht 5' (1.524 m)   Wt 137 lb (62.1 kg)   SpO2 100%   BMI 26.76 kg/m   Physical Exam  Constitutional: She is oriented to person, place, and time. She appears well-developed and well-nourished. No distress.  HENT:  Head: Normocephalic and atraumatic.  Eyes: EOM are normal. Pupils are equal, round, and reactive to light.  Neck: Normal range of motion. Neck supple.  Cardiovascular: Normal rate and regular rhythm.  Exam reveals no gallop and no friction rub.   No murmur heard. Pulmonary/Chest: Effort normal. She has no wheezes. She has no rales.  Abdominal:  Soft. She exhibits no distension and no mass. There is tenderness (diffuse, worst to the RLQ). There is no guarding.  Musculoskeletal: She exhibits tenderness (mild low back pain). She exhibits no edema.  Neurological: She is alert and oriented to person, place, and time.  Skin: Skin is warm and dry. She is not diaphoretic.  Psychiatric: She has a normal mood and affect. Her behavior is normal.  Nursing note and vitals reviewed.    ED Treatments / Results  Labs (all labs ordered are listed, but only abnormal results are displayed) Labs Reviewed  CBC WITH DIFFERENTIAL/PLATELET - Abnormal; Notable for the following:       Result Value   Hemoglobin 9.6 (*)    HCT 31.2 (*)    MCV 74.5 (*)    MCH 22.9 (*)    RDW 16.5 (*)    All other components within normal limits  COMPREHENSIVE METABOLIC PANEL - Abnormal; Notable for the following:    Chloride 112 (*)    Creatinine, Ser 1.66 (*)    Albumin 3.1 (*)    ALT 9 (*)    GFR calc non Af Amer 26 (*)    GFR calc Af Amer 30 (*)    All other components within normal limits  I-STAT CHEM 8, ED - Abnormal; Notable for the following:    Creatinine, Ser 1.80 (*)    Hemoglobin 11.2 (*)    HCT 33.0 (*)    All other components within normal limits  LIPASE, BLOOD    EKG  EKG Interpretation None       Radiology Ct Abdomen Pelvis Wo Contrast  Result Date: 09/29/2016 CLINICAL DATA:  83 year old female found down on the floor today complaining of right-sided hip pain. EXAM: CT ABDOMEN AND PELVIS WITHOUT CONTRAST TECHNIQUE: Multidetector CT imaging of the abdomen and pelvis was performed following the standard protocol without IV contrast. COMPARISON:  CT the abdomen and pelvis 09/18/2016. FINDINGS: Lower chest: Scarring in the visualized lung bases. Hepatobiliary: No definite cystic or solid hepatic lesions on today's noncontrast CT examination. No definite signs of acute traumatic injury to the liver on today's noncontrast CT examination. Status  post cholecystectomy. Pancreas: No definite pancreatic mass or peripancreatic inflammatory changes are noted on today's noncontrast CT examination. Spleen: No signs of acute traumatic injury to the spleen. Adrenals/Urinary Tract: No definite signs of acute traumatic injury to either kidney on today's noncontrast  CT examination. 13 mm low-attenuation lesion in the upper pole of the left kidney is incompletely characterized on today's noncontrast CT examination, but similar to the prior examination, favored to represent a tiny cyst. Right kidney and bilateral adrenal glands are normal in appearance. There is no hydroureteronephrosis. Urinary bladder is normal in appearance. Stomach/Bowel: The unenhanced appearance of the stomach is normal. There is no pathologic dilatation of small bowel or colon. Numerous colonic diverticulae are noted. As with the recent prior examination there is extensive mural thickening associated with proximal sigmoid colon in a region of extensive diverticulosis with extensive surrounding inflammatory changes and hypervascularity in the adjacent sigmoid mesocolon, compatible with an acute diverticulitis. The appendix is not confidently identified and may be surgically absent. Regardless, there are no inflammatory changes noted adjacent to the cecum to suggest the presence of an acute appendicitis at this time. Vascular/Lymphatic: Aortic atherosclerosis, without evidence of aneurysm in the abdominal or pelvic vasculature. No lymphadenopathy noted in the abdomen or pelvis. Reproductive: Status post hysterectomy.  Ovaries are atrophic. Other: No significant volume of ascites.  No pneumoperitoneum. Musculoskeletal: There are no aggressive appearing lytic or blastic lesions noted in the visualized portions of the skeleton. IMPRESSION: 1. Persistent evidence of sigmoid diverticulitis, very similar in appearance to the prior study from 09/18/2016, as detailed above. No definite diverticular abscess  noted on today's noncontrast CT examination, and no signs of frank perforation are identified. 2. Aortic atherosclerosis. 3. Status post cholecystectomy. 4. Additional incidental findings, as above. Electronically Signed   By: Vinnie Langton M.D.   On: 09/29/2016 13:45   Dg Chest 2 View  Result Date: 09/29/2016 CLINICAL DATA:  Multiple falls within an reason. No current chest complaints. History of hypertension. EXAM: CHEST  2 VIEW COMPARISON:  Radiographs 09/18/2016 and 02/11/2015. FINDINGS: The heart size and mediastinal contours are stable. There is aortic atherosclerosis. The lungs are clear. There is no pleural effusion or pneumothorax. No acute osseous findings are seen. There are probable postsurgical changes of the distal clavicle and cholecystectomy clips. IMPRESSION: No active cardiopulmonary process.  Aortic atherosclerosis. Electronically Signed   By: Richardean Sale M.D.   On: 09/29/2016 12:14   Ct Head Wo Contrast  Result Date: 09/29/2016 CLINICAL DATA:  83 year old female found down on the floor today. Poor historian. EXAM: CT HEAD WITHOUT CONTRAST TECHNIQUE: Contiguous axial images were obtained from the base of the skull through the vertex without intravenous contrast. COMPARISON:  Head CT 03/30/2013. FINDINGS: Brain: Moderate cerebral atrophy. Patchy and confluent areas of decreased attenuation are noted throughout the deep and periventricular white matter of the cerebral hemispheres bilaterally, compatible with chronic microvascular ischemic disease. No evidence of acute infarction, hemorrhage, hydrocephalus, extra-axial collection or mass lesion/mass effect. Vascular: No hyperdense vessel or unexpected calcification. Skull: Normal. Negative for fracture or focal lesion. Sinuses/Orbits: No acute finding. Other: None. IMPRESSION: 1. No evidence of significant acute traumatic injury to the skull or brain. 2. No acute intracranial abnormalities. 3. Moderate cerebral atrophy with mild  chronic microvascular ischemic changes in the cerebral white matter, as above. Electronically Signed   By: Vinnie Langton M.D.   On: 09/29/2016 13:19    Procedures Procedures (including critical care time)  Medications Ordered in ED Medications  iopamidol (ISOVUE-300) 61 % injection (not administered)  sodium chloride 0.9 % injection (not administered)     Initial Impression / Assessment and Plan / ED Course  I have reviewed the triage vital signs and the nursing notes.  Pertinent labs &  imaging results that were available during my care of the patient were reviewed by me and considered in my medical decision making (see chart for details).     83 yo F With a chief complaint of right lower quadrant abdominal pain. Patient also having be found on the ground earlier today. Will obtain a CT of the head CT the abdomen and pelvis.   CT scan with diverticulitis. Unchanged from prior. Discussed results with the family and had the patient is unable to eat and has been doing worse at home without admission. Family is declining at this time. She has antibiotics aren't prescribed to her. They have GI follow-up coming up.  2:55 PM:  I have discussed the diagnosis/risks/treatment options with the patient and family and believe the pt to be eligible for discharge home to follow-up with GI. We also discussed returning to the ED immediately if new or worsening sx occur. We discussed the sx which are most concerning (e.g., sudden worsening pain, fever, inability to tolerate by mouth) that necessitate immediate return. Medications administered to the patient during their visit and any new prescriptions provided to the patient are listed below.  Medications given during this visit Medications  iopamidol (ISOVUE-300) 61 % injection (not administered)  sodium chloride 0.9 % injection (not administered)     The patient appears reasonably screen and/or stabilized for discharge and I doubt any other  medical condition or other [facility] requiring further screening, evaluation, or treatment in the ED at this time prior to discharge.   Final Clinical Impressions(s) / ED Diagnoses   Final diagnoses:  Diverticulitis of large intestine without perforation or abscess, unspecified bleeding status    New Prescriptions New Prescriptions   No medications on file     Deno Etienne, DO 09/29/16 1455

## 2016-09-29 NOTE — Discharge Instructions (Signed)
Keep your appointment with your GI doctor.

## 2016-09-29 NOTE — ED Triage Notes (Signed)
Staff at [facility] found pt. On the floor in her room. Pt. C/o right hip area pain. She arrives here awake, alert and oriented to situation (only). She asks for her son (uses his correct name) and the paramedic assures her that he has been notified and is on his way.

## 2016-09-29 NOTE — ED Notes (Signed)
PT DISCHARGED. INSTRUCTIONS GIVEN. AAOX2. PT IN NO APPARENT DISTRESS OR PAIN. THE OPPORTUNITY TO ASK QUESTIONS WAS PROVIDED.

## 2016-09-29 NOTE — ED Notes (Signed)
Patient transported to X-ray 

## 2016-10-18 ENCOUNTER — Encounter (INDEPENDENT_AMBULATORY_CARE_PROVIDER_SITE_OTHER): Payer: Self-pay

## 2016-10-18 ENCOUNTER — Encounter: Payer: Self-pay | Admitting: Gastroenterology

## 2016-10-18 ENCOUNTER — Ambulatory Visit (INDEPENDENT_AMBULATORY_CARE_PROVIDER_SITE_OTHER): Payer: Medicare Other | Admitting: Gastroenterology

## 2016-10-18 VITALS — BP 126/62 | HR 88 | Ht 60.0 in | Wt 136.1 lb

## 2016-10-18 DIAGNOSIS — R1032 Left lower quadrant pain: Secondary | ICD-10-CM

## 2016-10-18 NOTE — Patient Instructions (Addendum)
CT abd and pelvis for persistent lower abd pains.  You have been scheduled for a CT scan of the abdomen and pelvis at [facility] ([address]---this is in the same building as Press [facility]).   You are scheduled on 10/24/16 at 1030 am. You should arrive 15 minutes prior to your appointment time for registration. Please follow the written instructions below on the day of your exam:  WARNING: IF YOU ARE ALLERGIC TO IODINE/X-RAY DYE, PLEASE NOTIFY RADIOLOGY IMMEDIATELY AT [phone]! YOU WILL BE GIVEN A 13 HOUR PREMEDICATION PREP.  1) Do not eat or drink anything after 630 am (4 hours prior to your test) 2) You have been given 2 bottles of oral contrast to drink. The solution may taste               better if refrigerated, but do NOT add ice or any other liquid to this solution. Shake             well before drinking.    Drink 1 bottle of contrast @ 830 am (2 hours prior to your exam)  Drink 1 bottle of contrast @ 730 am (1 hour prior to your exam)  You may take any medications as prescribed with a small amount of water except for the following: Metformin, Glucophage, Glucovance, Avandamet, Riomet, Fortamet, Actoplus Met, Janumet, Glumetza or Metaglip. The above medications must be held the day of the exam AND 48 hours after the exam.  The purpose of you drinking the oral contrast is to aid in the visualization of your intestinal tract. The contrast solution may cause some diarrhea. Before your exam is started, you will be given a small amount of fluid to drink. Depending on your individual set of symptoms, you may also receive an intravenous injection of x-ray contrast/dye. Plan on being at [facility] for 30 minutes or longer, depending on the type of exam you are having performed.  This test typically takes 30-45 minutes to complete.  If you have any questions regarding your exam or if you need to reschedule, you may call the CT department at [phone] between  the hours of 8:00 am and 5:00 pm, Monday-Friday.  ________________________________________________________________________  Colonoscopy in two weeks at [facility]

## 2016-10-18 NOTE — Progress Notes (Signed)
HPI: This is a    very pleasant, frail 83 year old woman whom I last saw when she was hospitalized about one month ago. She is here with her son today. Chief complaint is persistent lower abdominal pains  She was admitted for a few days for IV antibiotics for what appeared to be sigmoid diverticulitis on CAT scans. She tells me she completed a course of oral antibiotics after she went home. She is still bothered by lower abdominal pains. She also complains of vaginal discharge. She and her son say she has been evaluated by her primary care physician at her nursing facility and also by obstetrician. They're not clear why she is having vaginal discharge.  She has had no fevers or chills. No nausea or vomiting.   CT scan abd, pelvis 09/2016: 1. Persistent evidence of sigmoid diverticulitis, very similar in appearance to the prior study from 09/18/2016, as detailed above. No definite diverticular abscess noted on today's noncontrast CT examination, and no signs of frank perforation are identified. 2. Aortic atherosclerosis. 3. Status post cholecystectomy. 4. Additional incidental findings, as above.  When she was admitted to the hospital her white blood cell count was about 17,000. She also has chronic microcytic mild anemia.  Her last colonoscopy was in 2009 with Dr. Verl Blalock. He found diverticulosis and a single subcentimeter adenoma was removed.   ROS: complete GI ROS as described in HPI.  Constitutional:  No unintentional weight loss   Past Medical History:  Diagnosis Date  . Arthritis   . Bacterial meningitis   . 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 Chronic abdominal pain   . Dementia   . Diverticulosis   . DJD (degenerative joint disease)   . DM (diabetes mellitus) ([place])   . Dyslipidemia   . Edema   . Esophageal stricture   . Gastritis, chronic   . GERD (gastroesophageal reflux disease)   . Hiatal hernia   . Hx of adenomatous  colonic polyps   . Hypertension   . Hypertensive cardiovascular disease   . Microcytic anemia   . Obesity   . Renal disorder     Past Surgical History:  Procedure Laterality Date  . APPENDECTOMY    . BACK SURGERY    . BILATERAL OOPHORECTOMY  1957  . CHOLECYSTECTOMY  1999  . HERNIA REPAIR  08/6107   umbilical and ventral hernia repair by Dr. Margot Chimes  . ROTATOR CUFF REPAIR  7/05   right  . VESICOVAGINAL FISTULA CLOSURE W/ TAH  1957    Current Outpatient Prescriptions  Medication Sig Dispense Refill  . acetaminophen (TYLENOL) 500 MG tablet Take 1 tablet (500 mg total) by mouth every 6 (six) hours as needed for moderate pain. 30 tablet 0  . aspirin 81 MG chewable tablet Chew 81 mg by mouth daily.      . cholecalciferol (VITAMIN D) 400 units TABS tablet Take 800 Units by mouth daily.    . ciprofloxacin (CIPRO) 250 MG tablet Take 1 tablet (250 mg total) by mouth 2 (two) times daily. 20 tablet 0  . Cranberry 500 MG CAPS Take 500 mg by mouth 2 (two) times daily.    . dorzolamide (TRUSOPT) 2 % ophthalmic solution Place 1 drop into both eyes 3 (three) times daily.     . feeding supplement (BOOST / RESOURCE BREEZE) LIQD Take 1 Container by mouth 3 (three) times daily between meals. 90 Container 0  . fluocinonide cream (LIDEX) 0.05 %  Apply 1 application topically 2 (two) times daily as needed (For rash).    . folic acid (FOLVITE) 1 MG tablet Take 1 tablet (1 mg total) by mouth daily. 30 tablet 0  . LANTUS 100 UNIT/ML injection Inject 12 Units into the skin at bedtime.     Marland Kitchen latanoprost (XALATAN) 0.005 % ophthalmic solution Place 1 drop into both eyes at bedtime.     Marland Kitchen loperamide (IMODIUM) 2 MG capsule Take 2 mg by mouth daily as needed for diarrhea or loose stools.     . metroNIDAZOLE (FLAGYL) 500 MG tablet Take 1 tablet (500 mg total) by mouth 3 (three) times daily. 30 tablet 0  . mirtazapine (REMERON) 7.5 MG tablet Take 1 tablet (7.5 mg total) by mouth at bedtime. 30 tablet 0  . polyethylene  glycol powder (GLYCOLAX/MIRALAX) powder Take 17 g by mouth 2 (two) times daily.     . Probiotic Product (ALIGN) 4 MG CAPS Take 4 mg by mouth daily.     . ranitidine (ZANTAC) 150 MG capsule Take 150 mg by mouth 2 (two) times daily.     . sodium chloride (OCEAN) 0.65 % SOLN nasal spray Place 2 sprays into both nostrils as needed for congestion. Reported on 02/14/2016    . sorbitol 70 % solution Take 15 mLs by mouth daily as needed (constipation).      No current facility-administered medications for this visit.     Allergies as of 10/18/2016 - Review Complete 10/18/2016  Allergen Reaction Noted  . Penicillins Other (See Comments)   . Sulfamethoxazole-trimethoprim Swelling   . Bactrim  03/07/2011  . Trimethoprim  04/15/2013    Family History  Problem Relation Age of Onset  . Stroke Mother   . Cancer Father   . Hypertension Other     Social History   Social History  . Marital status: Widowed    Spouse name: N/A  . Number of children: 1  . Years of education: College   Occupational History  . Retired Retired   Social History Main Topics  . Smoking status: Never Smoker  . Smokeless tobacco: Never Used  . Alcohol use No  . Drug use: No  . Sexual activity: No   Other Topics Concern  . 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   She drinks 1 cup of caffeine per month and rarely drinks sodas.      Physical Exam: Ht 5' (1.524 m)   Wt 136 lb 2 oz (61.7 kg)   BMI 26.59 kg/m  Constitutional: generally well-appearing Psychiatric: alert and oriented x3 Abdomen: soft, Mildly tender lower abdomen, nondistended, no obvious ascites, no peritoneal signs, normal bowel sounds No peripheral edema noted in lower extremities  Assessment and plan: 83 y.o. female with Persistent lower abdominal pain  Perhaps she has persistent diverticulitis, pelvic infection. She  is a bit tender still on examination.  She is overall very fatigued. Going to order another CT scan abdomen and pelvis and depending on what that shows I might recommend colonoscopy as an outpatient. We are going to counsel her on that and consented for that now case that is to be the case. I prefer that to be at [facility] given her significant frailty.   Possibly vaginal discharge suggests colovaginal fistula.  Please see the "Patient Instructions" section for addition details  about the plan.  Owens Loffler, MD [facility] 10/18/2016, 1:22 PM

## 2016-10-24 ENCOUNTER — Telehealth: Payer: Self-pay | Admitting: Gastroenterology

## 2016-10-24 ENCOUNTER — Ambulatory Visit (INDEPENDENT_AMBULATORY_CARE_PROVIDER_SITE_OTHER)
Admission: RE | Admit: 2016-10-24 | Discharge: 2016-10-24 | Disposition: A | Discharge status: Home / Self Care | Payer: Medicare Other | Source: Ambulatory Visit | Attending: Gastroenterology | Admitting: Gastroenterology

## 2016-10-24 DIAGNOSIS — R1032 Left lower quadrant pain: Secondary | ICD-10-CM | POA: Diagnosis not present

## 2016-10-24 MED ORDER — NA SULFATE-K SULFATE-MG SULF 17.5-3.13-1.6 GM/177ML PO SOLN: 1.0000 | Freq: Once | ORAL | 0 refills | Status: AC

## 2016-10-24 NOTE — Telephone Encounter (Signed)
Prescription has been faxed to [facility] at [phone]

## 2016-10-29 NOTE — Progress Notes (Signed)
Requested records from [place] resident care coordinator and mar at 920 am 10-29-16 to be faxed and made aware patient is not to have any meds on 10-30-16 and 10-31-16 due to takes meds in applesauce. Spoke with son and Pryor Curia j Detweiler jr and made aware of 600 am arrival to [facility] long short stay and he will bring patient and sign consent.

## 2016-10-30 NOTE — Anesthesia Preprocedure Evaluation (Addendum)
Anesthesia Evaluation  Patient identified by MRN, date of birth, ID band Patient awake and Patient confused    Airway Mallampati: II  TM Distance: >3 FB Neck ROM: Full    Dental no notable dental hx.    Pulmonary neg pulmonary ROS,    breath sounds clear to auscultation       Cardiovascular hypertension,  Rhythm:Regular Rate:Normal     Neuro/Psych    GI/Hepatic hiatal hernia, GERD  ,  Endo/Other  diabetes, Poorly Controlled  Renal/GU Renal disease     Musculoskeletal   Abdominal   Peds  Hematology   Anesthesia Other Findings   Reproductive/Obstetrics                            Anesthesia Physical Anesthesia Plan  ASA: III  Anesthesia Plan: MAC   Post-op Pain Management:    Induction: Intravenous  Airway Management Planned: Natural Airway and Simple Face Mask  Additional Equipment:   Intra-op Plan:   Post-operative Plan:   Informed Consent: I have reviewed the patients History and Physical, chart, labs and discussed the procedure including the risks, benefits and alternatives for the proposed anesthesia with the patient or authorized representative who has indicated his/her understanding and acceptance.     Plan Discussed with: CRNA  Anesthesia Plan Comments:         Anesthesia Quick Evaluation

## 2016-10-30 NOTE — Progress Notes (Signed)
Requested last h and p note, labs and med rec from [facility] with [place] resident care coordinator by phone

## 2016-10-31 ENCOUNTER — Encounter (HOSPITAL_COMMUNITY): Payer: Self-pay

## 2016-10-31 ENCOUNTER — Ambulatory Visit (HOSPITAL_COMMUNITY): Payer: Medicare Other | Admitting: Anesthesiology

## 2016-10-31 ENCOUNTER — Encounter (HOSPITAL_COMMUNITY)
Admission: RE | Disposition: A | Discharge status: Skilled Nursing Facility | Payer: Self-pay | Source: Ambulatory Visit | Attending: Gastroenterology

## 2016-10-31 ENCOUNTER — Telehealth: Payer: Self-pay

## 2016-10-31 ENCOUNTER — Ambulatory Visit (HOSPITAL_COMMUNITY)
Admission: RE | Admit: 2016-10-31 | Discharge: 2016-10-31 | Disposition: A | Discharge status: Skilled Nursing Facility | Payer: Medicare Other | Source: Ambulatory Visit | Attending: Gastroenterology | Admitting: Gastroenterology

## 2016-10-31 DIAGNOSIS — K219 Gastro-esophageal reflux disease without esophagitis: Secondary | ICD-10-CM | POA: Insufficient documentation

## 2016-10-31 DIAGNOSIS — G8929 Other chronic pain: Secondary | ICD-10-CM | POA: Insufficient documentation

## 2016-10-31 DIAGNOSIS — N289 Disorder of kidney and ureter, unspecified: Secondary | ICD-10-CM | POA: Insufficient documentation

## 2016-10-31 DIAGNOSIS — E119 Type 2 diabetes mellitus without complications: Secondary | ICD-10-CM | POA: Insufficient documentation

## 2016-10-31 DIAGNOSIS — R1032 Left lower quadrant pain: Secondary | ICD-10-CM | POA: Diagnosis not present

## 2016-10-31 DIAGNOSIS — Z794 Long term (current) use of insulin: Secondary | ICD-10-CM | POA: Diagnosis not present

## 2016-10-31 DIAGNOSIS — N824 Other female intestinal-genital tract fistulae: Secondary | ICD-10-CM | POA: Diagnosis not present

## 2016-10-31 DIAGNOSIS — R933 Abnormal findings on diagnostic imaging of other parts of digestive tract: Secondary | ICD-10-CM | POA: Diagnosis not present

## 2016-10-31 DIAGNOSIS — I700 Atherosclerosis of aorta: Secondary | ICD-10-CM | POA: Insufficient documentation

## 2016-10-31 DIAGNOSIS — D509 Iron deficiency anemia, unspecified: Secondary | ICD-10-CM | POA: Insufficient documentation

## 2016-10-31 DIAGNOSIS — K449 Diaphragmatic hernia without obstruction or gangrene: Secondary | ICD-10-CM | POA: Insufficient documentation

## 2016-10-31 DIAGNOSIS — F0390 Unspecified dementia without behavioral disturbance: Secondary | ICD-10-CM | POA: Diagnosis not present

## 2016-10-31 DIAGNOSIS — K5730 Diverticulosis of large intestine without perforation or abscess without bleeding: Secondary | ICD-10-CM | POA: Diagnosis not present

## 2016-10-31 DIAGNOSIS — Z79899 Other long term (current) drug therapy: Secondary | ICD-10-CM | POA: Insufficient documentation

## 2016-10-31 DIAGNOSIS — K5732 Diverticulitis of large intestine without perforation or abscess without bleeding: Secondary | ICD-10-CM | POA: Diagnosis not present

## 2016-10-31 DIAGNOSIS — M1990 Unspecified osteoarthritis, unspecified site: Secondary | ICD-10-CM | POA: Insufficient documentation

## 2016-10-31 DIAGNOSIS — R109 Unspecified abdominal pain: Secondary | ICD-10-CM | POA: Diagnosis present

## 2016-10-31 DIAGNOSIS — Z7982 Long term (current) use of aspirin: Secondary | ICD-10-CM | POA: Diagnosis not present

## 2016-10-31 DIAGNOSIS — K6389 Other specified diseases of intestine: Secondary | ICD-10-CM | POA: Insufficient documentation

## 2016-10-31 DIAGNOSIS — E785 Hyperlipidemia, unspecified: Secondary | ICD-10-CM | POA: Insufficient documentation

## 2016-10-31 DIAGNOSIS — I119 Hypertensive heart disease without heart failure: Secondary | ICD-10-CM | POA: Insufficient documentation

## 2016-10-31 HISTORY — PX: COLONOSCOPY WITH PROPOFOL: SHX5780

## 2016-10-31 LAB — GLUCOSE, CAPILLARY: GLUCOSE-CAPILLARY: 101 mg/dL — AB (ref 65–99)

## 2016-10-31 SURGERY — COLONOSCOPY WITH PROPOFOL
Anesthesia: Monitor Anesthesia Care

## 2016-10-31 MED ORDER — PROPOFOL 10 MG/ML IV BOLUS
INTRAVENOUS | Status: AC
  Filled 2016-10-31: qty 60

## 2016-10-31 MED ORDER — PROPOFOL 10 MG/ML IV BOLUS
INTRAVENOUS | Status: DC | PRN
  Administered 2016-10-31: 10 mg via INTRAVENOUS
  Administered 2016-10-31: 20 mg via INTRAVENOUS

## 2016-10-31 MED ORDER — SODIUM CHLORIDE 0.9 % IV SOLN
INTRAVENOUS | Status: DC
  Administered 2016-10-31: 07:00:00 via INTRAVENOUS
  Administered 2016-10-31: 1000 mL via INTRAVENOUS

## 2016-10-31 MED ORDER — LIDOCAINE 2% (20 MG/ML) 5 ML SYRINGE
INTRAMUSCULAR | Status: DC | PRN
  Administered 2016-10-31: 60 mg via INTRAVENOUS

## 2016-10-31 MED ORDER — LIDOCAINE 2% (20 MG/ML) 5 ML SYRINGE
INTRAMUSCULAR | Status: AC
  Filled 2016-10-31: qty 5

## 2016-10-31 MED ORDER — PROPOFOL 500 MG/50ML IV EMUL
INTRAVENOUS | Status: DC | PRN
  Administered 2016-10-31: 50 ug/kg/min via INTRAVENOUS

## 2016-10-31 SURGICAL SUPPLY — 22 items

## 2016-10-31 NOTE — H&P (View-Only) (Signed)
HPI: This is a    very pleasant, frail 83 year old woman whom I last saw when she was hospitalized about one month ago. She is here with her son today. Chief complaint is persistent lower abdominal pains  She was admitted for a few days for IV antibiotics for what appeared to be sigmoid diverticulitis on CAT scans. She tells me she completed a course of oral antibiotics after she went home. She is still bothered by lower abdominal pains. She also complains of vaginal discharge. She and her son say she has been evaluated by her primary care physician at her nursing facility and also by obstetrician. They're not clear why she is having vaginal discharge.  She has had no fevers or chills. No nausea or vomiting.   CT scan abd, pelvis 09/2016: 1. Persistent evidence of sigmoid diverticulitis, very similar in appearance to the prior study from 09/18/2016, as detailed above. No definite diverticular abscess noted on today's noncontrast CT examination, and no signs of frank perforation are identified. 2. Aortic atherosclerosis. 3. Status post cholecystectomy. 4. Additional incidental findings, as above.  When she was admitted to the hospital her white blood cell count was about 17,000. She also has chronic microcytic mild anemia.  Her last colonoscopy was in 2009 with Dr. Verl Blalock. He found diverticulosis and a single subcentimeter adenoma was removed.   ROS: complete GI ROS as described in HPI.  Constitutional:  No unintentional weight loss   Past Medical History:  Diagnosis Date  . Arthritis   . Bacterial meningitis   . 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 Chronic abdominal pain   . Dementia   . Diverticulosis   . DJD (degenerative joint disease)   . DM (diabetes mellitus) ([place])   . Dyslipidemia   . Edema   . Esophageal stricture   . Gastritis, chronic   . GERD (gastroesophageal reflux disease)   . Hiatal hernia   . Hx of adenomatous  colonic polyps   . Hypertension   . Hypertensive cardiovascular disease   . Microcytic anemia   . Obesity   . Renal disorder     Past Surgical History:  Procedure Laterality Date  . APPENDECTOMY    . BACK SURGERY    . BILATERAL OOPHORECTOMY  1957  . CHOLECYSTECTOMY  1999  . HERNIA REPAIR  09/8411   umbilical and ventral hernia repair by Dr. Margot Chimes  . ROTATOR CUFF REPAIR  7/05   right  . VESICOVAGINAL FISTULA CLOSURE W/ TAH  1957    Current Outpatient Prescriptions  Medication Sig Dispense Refill  . acetaminophen (TYLENOL) 500 MG tablet Take 1 tablet (500 mg total) by mouth every 6 (six) hours as needed for moderate pain. 30 tablet 0  . aspirin 81 MG chewable tablet Chew 81 mg by mouth daily.      . cholecalciferol (VITAMIN D) 400 units TABS tablet Take 800 Units by mouth daily.    . ciprofloxacin (CIPRO) 250 MG tablet Take 1 tablet (250 mg total) by mouth 2 (two) times daily. 20 tablet 0  . Cranberry 500 MG CAPS Take 500 mg by mouth 2 (two) times daily.    . dorzolamide (TRUSOPT) 2 % ophthalmic solution Place 1 drop into both eyes 3 (three) times daily.     . feeding supplement (BOOST / RESOURCE BREEZE) LIQD Take 1 Container by mouth 3 (three) times daily between meals. 90 Container 0  . fluocinonide cream (LIDEX) 0.05 %  Apply 1 application topically 2 (two) times daily as needed (For rash).    . folic acid (FOLVITE) 1 MG tablet Take 1 tablet (1 mg total) by mouth daily. 30 tablet 0  . LANTUS 100 UNIT/ML injection Inject 12 Units into the skin at bedtime.     Marland Kitchen latanoprost (XALATAN) 0.005 % ophthalmic solution Place 1 drop into both eyes at bedtime.     Marland Kitchen loperamide (IMODIUM) 2 MG capsule Take 2 mg by mouth daily as needed for diarrhea or loose stools.     . metroNIDAZOLE (FLAGYL) 500 MG tablet Take 1 tablet (500 mg total) by mouth 3 (three) times daily. 30 tablet 0  . mirtazapine (REMERON) 7.5 MG tablet Take 1 tablet (7.5 mg total) by mouth at bedtime. 30 tablet 0  . polyethylene  glycol powder (GLYCOLAX/MIRALAX) powder Take 17 g by mouth 2 (two) times daily.     . Probiotic Product (ALIGN) 4 MG CAPS Take 4 mg by mouth daily.     . ranitidine (ZANTAC) 150 MG capsule Take 150 mg by mouth 2 (two) times daily.     . sodium chloride (OCEAN) 0.65 % SOLN nasal spray Place 2 sprays into both nostrils as needed for congestion. Reported on 02/14/2016    . sorbitol 70 % solution Take 15 mLs by mouth daily as needed (constipation).      No current facility-administered medications for this visit.     Allergies as of 10/18/2016 - Review Complete 10/18/2016  Allergen Reaction Noted  . Penicillins Other (See Comments)   . Sulfamethoxazole-trimethoprim Swelling   . Bactrim  03/07/2011  . Trimethoprim  04/15/2013    Family History  Problem Relation Age of Onset  . Stroke Mother   . Cancer Father   . Hypertension Other     Social History   Social History  . Marital status: Widowed    Spouse name: N/A  . Number of children: 1  . Years of education: College   Occupational History  . Retired Retired   Social History Main Topics  . Smoking status: Never Smoker  . Smokeless tobacco: Never Used  . Alcohol use No  . Drug use: No  . Sexual activity: No   Other Topics Concern  . 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   She drinks 1 cup of caffeine per month and rarely drinks sodas.      Physical Exam: Ht 5' (1.524 m)   Wt 136 lb 2 oz (61.7 kg)   BMI 26.59 kg/m  Constitutional: generally well-appearing Psychiatric: alert and oriented x3 Abdomen: soft, Mildly tender lower abdomen, nondistended, no obvious ascites, no peritoneal signs, normal bowel sounds No peripheral edema noted in lower extremities  Assessment and plan: 83 y.o. female with Persistent lower abdominal pain  Perhaps she has persistent diverticulitis, pelvic infection. She  is a bit tender still on examination.  She is overall very fatigued. Going to order another CT scan abdomen and pelvis and depending on what that shows I might recommend colonoscopy as an outpatient. We are going to counsel her on that and consented for that now case that is to be the case. I prefer that to be at [facility] given her significant frailty.   Possibly vaginal discharge suggests colovaginal fistula.  Please see the "Patient Instructions" section for addition details  about the plan.  Owens Loffler, MD [facility] 10/18/2016, 1:22 PM

## 2016-10-31 NOTE — Discharge Instructions (Signed)
YOU HAD AN ENDOSCOPIC PROCEDURE TODAY: Refer to the procedure report and other information in the discharge instructions given to you for any specific questions about what was found during the examination. If this information does not answer your questions, please call [facility] office at [phone] to clarify.  ° °YOU SHOULD EXPECT: Some feelings of bloating in the abdomen. Passage of more gas than usual. Walking can help get rid of the air that was put into your GI tract during the procedure and reduce the bloating. If you had a lower endoscopy (such as a colonoscopy or flexible sigmoidoscopy) you may notice spotting of blood in your stool or on the toilet paper. Some abdominal soreness may be present for a day or two, also. ° °DIET: Your first meal following the procedure should be a light meal and then it is ok to progress to your normal diet. A half-sandwich or bowl of soup is an example of a good first meal. Heavy or fried foods are harder to digest and may make you feel nauseous or bloated. Drink plenty of fluids but you should avoid alcoholic beverages for 24 hours. If you had a esophageal dilation, please see attached instructions for diet.   ° °ACTIVITY: Your care partner should take you home directly after the procedure. You should plan to take it easy, moving slowly for the rest of the day. You can resume normal activity the day after the procedure however YOU SHOULD NOT DRIVE, use power tools, machinery or perform tasks that involve climbing or major physical exertion for 24 hours (because of the sedation medicines used during the test).  ° °SYMPTOMS TO REPORT IMMEDIATELY: °A gastroenterologist can be reached at any hour. Please call [phone]  for any of the following symptoms:  °Following lower endoscopy (colonoscopy, flexible sigmoidoscopy) °Excessive amounts of blood in the stool  °Significant tenderness, worsening of abdominal pains  °Swelling of the abdomen that is new, acute  °Fever of 100° or  higher  °Following upper endoscopy (EGD, EUS, ERCP, esophageal dilation) °Vomiting of blood or coffee ground material  °New, significant abdominal pain  °New, significant chest pain or pain under the shoulder blades  °Painful or persistently difficult swallowing  °New shortness of breath  °Black, tarry-looking or red, bloody stools ° °FOLLOW UP:  °If any biopsies were taken you will be contacted by phone or by letter within the next 1-3 weeks. Call [phone]  if you have not heard about the biopsies in 3 weeks.  °Please also call with any specific questions about appointments or follow up tests. ° °

## 2016-10-31 NOTE — Op Note (Signed)
[facility] Patient Name: Allison Rodgers Procedure Date: 10/31/2016 MRN: [id number] Attending MD: Milus Banister , MD Date of Birth: 1924-05-23 CSN: [id number] Age: 83 Admit Type: Outpatient Procedure:                Colonoscopy Indications:              Abnormal CT of the GI tract; chronic abd pain,                            abnormal CT scan; abnormal vaginal discharge Providers:                Milus Banister, MD, Cherylynn Ridges, Technician,                            Dione Booze, CRNA, Laverta Baltimore RN, RN Referring MD:              Medicines:                Monitored Anesthesia Care Complications:            No immediate complications. Estimated blood loss:                            None. Estimated Blood Loss:     Estimated blood loss: none. Procedure:                Pre-Anesthesia Assessment:                           - Prior to the procedure, a History and Physical                            was performed, and patient medications and                            allergies were reviewed. The patient's tolerance of                            previous anesthesia was also reviewed. The risks                            and benefits of the procedure and the sedation                            options and risks were discussed with the patient.                            All questions were answered, and informed consent                            was obtained. Prior Anticoagulants: The patient has                            taken no previous anticoagulant or antiplatelet  agents. ASA Grade Assessment: III - A patient with                            severe systemic disease. After reviewing the risks                            and benefits, the patient was deemed in                            satisfactory condition to undergo the procedure.                           After obtaining informed consent, the colonoscope                             was passed under direct vision. Throughout the                            procedure, the patient's blood pressure, pulse, and                            oxygen saturations were monitored continuously. The                            EC-[id number] ([id number]) scope was introduced through                            the anus and advanced to the the cecum, identified                            by appendiceal orifice and ileocecal valve. The                            colonoscopy was performed without difficulty. The                            patient tolerated the procedure well. The quality                            of the bowel preparation was excellent. The                            ileocecal valve, appendiceal orifice, and rectum                            were photographed. Scope In: 7:38:20 AM Scope Out: 7:54:03 AM Scope Withdrawal Time: 0 hours 10 minutes 56 seconds  Total Procedure Duration: 0 hours 15 minutes 43 seconds  Findings:      Multiple small and large-mouthed diverticula were found in the entire       colon. This was most significant in the sigmoid segement where there was       narrowing, erythema, petechia, edema for 8-10cm. I biospied the mucosa       in this segment to exclude neoplasm  which I think is unlikely. I did not       identify a fistula opening.      The exam was otherwise without abnormality on direct and retroflexion       views. Impression:               - Significant, chronic appearing diverticulitis in                            the sigmoid segment. Biospies taken to exclude                            neoplasm which I think is unlikely. Given the data                            overall (CT scan, clinical history, these findings)                            I am suspicious for colovaginal fistula related to                            the chronic diverticulitis segment. Will refer to                            Dr. Leighton Ruff at [facility] to consider further                             testing/treatment.                           - The examination was otherwise normal on direct                            and retroflexion views. Moderate Sedation:      N/A- Per Anesthesia Care Recommendation:           - Patient has a contact number available for                            emergencies. The signs and symptoms of potential                            delayed complications were discussed with the                            patient. Return to normal activities tomorrow.                            Written discharge instructions were provided to the                            patient.                           - Resume previous diet.                           -  Continue present medications.                           - Await pathology results.                           - Will refer to Dr. Marcello Moores at [facility] for                            presumed colovaginal fistual related to chronic                            sigmoid diverticulitis segment. Procedure Code(s):        --- Professional ---                           [phone], Colonoscopy, flexible; with biopsy, single                            or multiple Diagnosis Code(s):        --- Professional ---                           K57.30, Diverticulosis of large intestine without                            perforation or abscess without bleeding                           R93.3, Abnormal findings on diagnostic imaging of                            other parts of digestive tract CPT copyright 2016 [employer]. All rights reserved. The codes documented in this report are preliminary and upon coder review may  be revised to meet current compliance requirements. Milus Banister, MD 10/31/2016 8:07:18 AM This report has been signed electronically. Number of Addenda: 0

## 2016-10-31 NOTE — Anesthesia Postprocedure Evaluation (Addendum)
Anesthesia Post Note  Patient: Allison Rodgers  Procedure(s) Performed: Procedure(s) (LRB): COLONOSCOPY WITH PROPOFOL (N/A)  Patient location during evaluation: Endoscopy Anesthesia Type: MAC Level of consciousness: awake and alert Pain management: pain level controlled Vital Signs Assessment: post-procedure vital signs reviewed and stable Respiratory status: spontaneous breathing, nonlabored ventilation, respiratory function stable and patient connected to nasal cannula oxygen Cardiovascular status: stable and blood pressure returned to baseline Anesthetic complications: no       Last Vitals:  Vitals:   10/31/16 0845 10/31/16 0850  BP:  (!) 143/76  Pulse:  78  Resp:  18  Temp: 36.4 C     Last Pain:  Vitals:   10/31/16 0845  TempSrc:   PainSc: 0-No pain                 Neeley Sedivy,JAMES TERRILL

## 2016-10-31 NOTE — Transfer of Care (Signed)
Immediate Anesthesia Transfer of Care Note  Patient: Allison Rodgers  Procedure(s) Performed: Procedure(s): COLONOSCOPY WITH PROPOFOL (N/A)  Patient Location: PACU and Endoscopy Unit  Anesthesia Type:MAC  Level of Consciousness: awake and patient cooperative  Airway & Oxygen Therapy: Patient Spontanous Breathing and Patient connected to face mask oxygen  Post-op Assessment: Report given to RN and Post -op Vital signs reviewed and stable  Post vital signs: Reviewed and stable  Last Vitals:  Vitals:   10/31/16 0646  BP: (!) 136/57  Pulse: 87  Resp: (!) 23  Temp: 36.3 C    Last Pain:  Vitals:   10/31/16 0728  TempSrc:   PainSc: 7          Complications: No apparent anesthesia complications

## 2016-10-31 NOTE — Interval H&P Note (Signed)
History and Physical Interval Note:  10/31/2016 7:13 AM  Allison Rodgers  has presented today for surgery, with the diagnosis of persistent lower abd pain  The various methods of treatment have been discussed with the patient and family. After consideration of risks, benefits and other options for treatment, the patient has consented to  Procedure(s): COLONOSCOPY WITH PROPOFOL (N/A) as a surgical intervention .  The patient's history has been reviewed, patient examined, no change in status, stable for surgery.  I have reviewed the patient's chart and labs.  Questions were answered to the patient's satisfaction.     Milus Banister

## 2016-10-31 NOTE — Telephone Encounter (Signed)
Referral has been made to [facility].   

## 2016-10-31 NOTE — Anesthesia Procedure Notes (Signed)
Procedure Name: MAC Date/Time: 10/31/2016 7:29 AM Performed by: Dione Booze Pre-anesthesia Checklist: Patient identified, Emergency Drugs available, Suction available and Patient being monitored Oxygen Delivery Method: Simple face mask Placement Confirmation: positive ETCO2

## 2016-10-31 NOTE — Telephone Encounter (Signed)
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   Thanks

## 2016-11-03 ENCOUNTER — Encounter (HOSPITAL_COMMUNITY): Payer: Self-pay | Admitting: Gastroenterology

## 2016-11-08 ENCOUNTER — Other Ambulatory Visit: Payer: Medicare Other

## 2016-11-11 ENCOUNTER — Other Ambulatory Visit: Payer: Self-pay | Admitting: Endocrinology

## 2016-11-11 ENCOUNTER — Other Ambulatory Visit (INDEPENDENT_AMBULATORY_CARE_PROVIDER_SITE_OTHER): Payer: Medicare Other

## 2016-11-11 DIAGNOSIS — Z794 Long term (current) use of insulin: Secondary | ICD-10-CM

## 2016-11-11 DIAGNOSIS — E1165 Type 2 diabetes mellitus with hyperglycemia: Secondary | ICD-10-CM

## 2016-11-11 LAB — BASIC METABOLIC PANEL
BUN: 17 mg/dL (ref 6–23)
CALCIUM: 9.8 mg/dL (ref 8.4–10.5)
CO2: 30 meq/L (ref 19–32)
Chloride: 107 mEq/L (ref 96–112)
Creatinine, Ser: 1.45 mg/dL — ABNORMAL HIGH (ref 0.40–1.20)
GFR: 43.51 mL/min — ABNORMAL LOW (ref >60.00)
Glucose, Bld: 78 mg/dL (ref 70–99)
Potassium: 3.9 mEq/L (ref 3.5–5.1)
SODIUM: 142 meq/L (ref 135–145)

## 2016-11-11 LAB — HEMOGLOBIN A1C: HEMOGLOBIN A1C: 5.3 % (ref 4.6–6.5)

## 2016-11-13 ENCOUNTER — Ambulatory Visit: Payer: Medicare Other | Admitting: Endocrinology

## 2016-11-13 NOTE — Progress Notes (Signed)
Patient ID: Allison Rodgers, female   DOB: [birth year], 83 y.o.   MRN: [id number]   Reason for Appointment : Followup for Type 2 Diabetes  History of Present llness          Diagnosis: Type 2 diabetes mellitus, date of diagnosis: ? 2007       Past history:  Apparently she had blood sugars over 500 at the time of diagnosis She was started on insulin at the time of diagnosis and has been continued on insulin with various regimens Also her weight had been previously significantly more, about 200 pounds Her blood sugars  were higher in late December/early January especially after meals, with highest 356.  With this she was started on NovoLog 5 units before meals at the end of January  Recent history:   INSULIN regimen: Lantus 12 units hs  She had been on LANTUS twice a day along with small doses of Novolog at breakfast and supper However she was hospitalized in mid February and her insulin was changed to only once a day Lantus and NovoLog was stopped Also she appears to be overall eating much less and has lost 20 pounds since her last visit  Blood sugar patterns: She has had fairly good readings in the mornings with a couple of readings below 100 and lowest blood sugar 60 about a month ago Blood sugars are in the low 100+ range at lunchtime usually with a couple of high readings last month Blood sugars are also reasonably good at suppertime and high only twice She has only a few readings done after supper and these are recently averaging about 200  Hemoglobin A1c: This tends to be lower than expected probably because of her renal insufficiency A1c is now 5.3  Glucose monitoring:  done 2-3 times  a day.   Glucometer: ?     Blood Glucose readings from nursing home record recently as above:    Hypoglycemia : None       Meals: 3 meals per day.  breakfast:  Egg, toast, juice and sausage  Physical activity: exercise: Trying to walk in her nursing home But overall not very  active Dietician consultations: Several years ago            Wt Readings from Last 3 Encounters:  11/14/16 131 lb (59.4 kg)  10/31/16 136 lb (61.7 kg)  10/18/16 136 lb 2 oz (61.7 kg)   LABS:  Lab Results  Component Value Date   HGBA1C 5.3 11/11/2016   HGBA1C 5.5 08/15/2016   HGBA1C 5.2 05/15/2016   Lab Results  Component Value Date   MICROALBUR <0.7 11/15/2015   LDLCALC 66 05/15/2016   CREATININE 1.45 (H) 11/11/2016   Lab Results  Component Value Date   FRUCTOSAMINE 256 05/15/2016   FRUCTOSAMINE 296 (H) 11/15/2015   FRUCTOSAMINE 251 08/16/2013     Allergies as of 11/14/2016      Reactions   Penicillins Other (See Comments)   REACTION: causes hallucinations   Sulfamethoxazole-trimethoprim Swelling   REACTION: rash   Bactrim    Unknown per mar   Trimethoprim    Unknown per mar      Medication List       Accurate as of 11/14/16  8:13 AM. Always use your most recent med list.          acetaminophen 500 MG tablet Commonly known as:  TYLENOL Take 1 tablet (500 mg total) by mouth every 6 (six) hours as needed for moderate  pain.   ALIGN 4 MG Caps Take 4 mg by mouth daily.   aspirin 81 MG chewable tablet Chew 81 mg by mouth daily.   cholecalciferol 400 units Tabs tablet Commonly known as:  VITAMIN D Take 800 Units by mouth daily.   clotrimazole-betamethasone cream Commonly known as:  LOTRISONE Apply 1 application topically 2 (two) times daily.   Cranberry 500 MG Caps Take 500 mg by mouth 2 (two) times daily.   dorzolamide 2 % ophthalmic solution Commonly known as:  TRUSOPT Place 1 drop into both eyes 3 (three) times daily.   doxycycline 100 MG capsule Commonly known as:  VIBRAMYCIN Take 100 mg by mouth 2 (two) times daily. For 3 weeks - started 10/25/16   feeding supplement Liqd Take 1 Container by mouth 3 (three) times daily between meals.   fluocinonide cream 0.05 % Commonly known as:  LIDEX Apply 1 application topically 2 (two) times daily as  needed (For rash).   folic acid 1 MG tablet Commonly known as:  FOLVITE Take 1 mg by mouth daily.   guaifenesin 100 MG/5ML syrup Commonly known as:  ROBITUSSIN Take 200 mg by mouth 3 (three) times daily as needed for cough or congestion.   LANTUS 100 UNIT/ML injection Generic drug:  insulin glargine Inject 12 Units into the skin at bedtime.   loperamide 2 MG capsule Commonly known as:  IMODIUM Take 2 mg by mouth daily as needed for diarrhea or loose stools.   mirtazapine 7.5 MG tablet Commonly known as:  REMERON Take 7.5 mg by mouth at bedtime.   polyethylene glycol powder powder Commonly known as:  GLYCOLAX/MIRALAX Take 17 g by mouth 2 (two) times daily.   ranitidine 150 MG capsule Commonly known as:  ZANTAC Take 150 mg by mouth 2 (two) times daily.   sodium chloride 0.65 % Soln nasal spray Commonly known as:  OCEAN Place 2 sprays into both nostrils as needed for congestion. Reported on 02/14/2016   sorbitol 70 % solution Take 30 mLs by mouth daily as needed (constipation).   XALATAN 0.005 % ophthalmic solution Generic drug:  latanoprost Place 1 drop into both eyes at bedtime.       Allergies:  Allergies  Allergen Reactions  . Penicillins Other (See Comments)    REACTION: causes hallucinations  . Sulfamethoxazole-Trimethoprim Swelling    REACTION: rash  . Bactrim     Unknown per mar  . Trimethoprim     Unknown per mar     Past Medical History:  Diagnosis Date  . Arthritis   . Bacterial meningitis   . 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 Chronic abdominal pain   . Dementia   . Diverticulosis   . DJD (degenerative joint disease)   . DM (diabetes mellitus) ([place])   . Dyslipidemia   . Edema   . Esophageal stricture   . Gastritis, chronic   . GERD (gastroesophageal reflux disease)   . Hiatal hernia   . Hx of adenomatous colonic polyps   . Hypertension   . Hypertensive cardiovascular disease   . Microcytic  anemia   . Obesity   . Renal disorder     Past Surgical History:  Procedure Laterality Date  . APPENDECTOMY    . BACK SURGERY    . BILATERAL OOPHORECTOMY  1957  . CHOLECYSTECTOMY  1999  . COLONOSCOPY WITH PROPOFOL N/A 10/31/2016   Procedure: COLONOSCOPY WITH PROPOFOL;  Surgeon: Milus Banister, MD;  Location: [facility] END[facility]COPY;  Service: Endoscopy;  Laterality: N/A;  . HERNIA REPAIR  10/5699   umbilical and ventral hernia repair by Dr. Margot Chimes  . ROTATOR CUFF REPAIR  7/05   right  . VESICOVAGINAL FISTULA CLOSURE W/ TAH  1957    Family History  Problem Relation Age of Onset  . Stroke Mother   . Cancer Father   . Hypertension Other     Social History:  reports that she has never smoked. She has never used smokeless tobacco. She reports that she does not drink alcohol or use drugs.    Review of Systems   She has had some GI problems and is going to see a surgeon for possible fistula   Lipids: She is  not on any Statin drugs And usually lipids are fairly good  Lab Results  Component Value Date   CHOL 128 05/15/2016   HDL 41.80 05/15/2016   LDLCALC 66 05/15/2016   TRIG 98.0 05/15/2016   CHOLHDL 3 05/15/2016    RENAL insufficiency: Improved   She is taking diuretics including spironolactone.   Lab Results  Component Value Date   CREATININE 1.45 (H) 11/11/2016   BUN 17 11/11/2016   NA 142 11/11/2016   K 3.9 11/11/2016   CL 107 11/11/2016   CO2 30 11/11/2016      Physical Examination:  BP 126/76   Pulse 99   Ht 5' (1.524 m)   Wt 131 lb (59.4 kg)   SpO2 94%   BMI 25.58 kg/m      ASSESSMENT:  Diabetes type 2 On insulin   See history of present illness for detailed discussion of his current management, blood sugar patterns and problems identified  With her weight loss she is requiring less insulin and recently not on any mealtime insulin Blood sugars are somewhat higher in the evenings around bedtime but not checked enough With taking Lantus at night  only heard morning sugars are low normal at times   PLAN:   Will switch her Lantus to the morning to provide better coverage during the day and avoid overnight hypoglycemia Needs to have more sugars checked after supper If her sugars are higher after supper consistently will need to add mealtime insulin at that time    [facility] 11/14/2016, 8:13 AM

## 2016-11-14 ENCOUNTER — Encounter: Payer: Self-pay | Admitting: Endocrinology

## 2016-11-14 ENCOUNTER — Ambulatory Visit (INDEPENDENT_AMBULATORY_CARE_PROVIDER_SITE_OTHER): Payer: Medicare Other | Admitting: Endocrinology

## 2016-11-14 VITALS — BP 126/76 | HR 99 | Ht 60.0 in | Wt 131.0 lb

## 2016-11-14 DIAGNOSIS — Z794 Long term (current) use of insulin: Secondary | ICD-10-CM | POA: Diagnosis not present

## 2016-11-14 DIAGNOSIS — E1165 Type 2 diabetes mellitus with hyperglycemia: Secondary | ICD-10-CM | POA: Diagnosis not present

## 2017-01-02 ENCOUNTER — Ambulatory Visit (INDEPENDENT_AMBULATORY_CARE_PROVIDER_SITE_OTHER): Payer: Medicare Other | Admitting: Endocrinology

## 2017-01-02 ENCOUNTER — Encounter: Payer: Self-pay | Admitting: Endocrinology

## 2017-01-02 VITALS — BP 120/56 | HR 67 | Ht 60.0 in | Wt 136.8 lb

## 2017-01-02 DIAGNOSIS — E1165 Type 2 diabetes mellitus with hyperglycemia: Secondary | ICD-10-CM | POA: Diagnosis not present

## 2017-01-02 DIAGNOSIS — Z794 Long term (current) use of insulin: Secondary | ICD-10-CM

## 2017-01-02 NOTE — Progress Notes (Signed)
Patient ID: Allison Rodgers, female   DOB: Aug 06, 1924, 83 y.o.   MRN: [id number]   Reason for Appointment : Followup for Type 2 Diabetes  History of Present llness          Diagnosis: Type 2 diabetes mellitus, date of diagnosis: ? 2007       Past history:  Apparently she had blood sugars over 500 at the time of diagnosis She was started on insulin at the time of diagnosis and has been continued on insulin with various regimens Also her weight had been previously significantly more, about 200 pounds Her blood sugars  were higher in late December/early January especially after meals, with highest 356.  With this she was started on NovoLog 5 units before meals at the end of January  Recent history:   INSULIN regimen: Lantus 12 units am  She had been on LANTUS twice a day along with small doses of Novolog at breakfast and supper However she was hospitalized in mid February and her insulin was changed to only once a day Lantus and NovoLog was stopped In April her Lantus was changed from evening to the morning and no NovoLog started  Blood sugar patterns: She has gained back a little weight that she had lost She has fairly stable fasting readings near normal and only rarely high or below 100 However at the nursing home blood sugars are being checked mostly around lunchtime and only sporadically at suppertime, has only one reading after supper Most of her readings around lunchtime are significantly higher and over 200 frequently Has somewhat variable readings at suppertime, frequently not high She gives inconsistent history about what she is eating for breakfast and said that she gets rice in the morning but no other carbohydrate   Hemoglobin A1c: This tends to be lower than expected probably because of her renal insufficiency A1c in April was 5.3  Glucose monitoring:  done 2-3 times  a day.   Glucometer: ?     Blood Glucose readings from nursing home record recently   Mean  values apply above for all meters except median for One Touch  PRE-MEAL Fasting Lunch Dinner Bedtime Overall  Glucose range: 95-170 146-326 128-209 186   Mean/median: 125 200 160        Hypoglycemia : None       Meals: 3 meals per day.  breakfast:  Egg,meat,  toast, juice   Physical activity: exercise: Trying to walk in her nursing home  Dietician consultations: Several years ago            Wt Readings from Last 3 Encounters:  01/02/17 136 lb 12.8 oz (62.1 kg)  11/14/16 131 lb (59.4 kg)  10/31/16 136 lb (61.7 kg)   LABS:  Lab Results  Component Value Date   HGBA1C 5.3 11/11/2016   HGBA1C 5.5 08/15/2016   HGBA1C 5.2 05/15/2016   Lab Results  Component Value Date   MICROALBUR <0.7 11/15/2015   LDLCALC 66 05/15/2016   CREATININE 1.45 (H) 11/11/2016   Lab Results  Component Value Date   FRUCTOSAMINE 256 05/15/2016   FRUCTOSAMINE 296 (H) 11/15/2015   FRUCTOSAMINE 251 08/16/2013     Allergies as of 01/02/2017      Reactions   Penicillins Other (See Comments)   REACTION: causes hallucinations   Sulfamethoxazole-trimethoprim Swelling   REACTION: rash   Bactrim    Unknown per mar   Trimethoprim    Unknown per mar      Medication  List       Accurate as of 01/02/17 11:11 AM. Always use your most recent med list.          acetaminophen 500 MG tablet Commonly known as:  TYLENOL Take 1 tablet (500 mg total) by mouth every 6 (six) hours as needed for moderate pain.   ALIGN 4 MG Caps Take 4 mg by mouth daily.   aspirin 81 MG chewable tablet Chew 81 mg by mouth daily.   cholecalciferol 400 units Tabs tablet Commonly known as:  VITAMIN D Take 800 Units by mouth daily.   clotrimazole-betamethasone cream Commonly known as:  LOTRISONE Apply 1 application topically 2 (two) times daily.   Cranberry 500 MG Caps Take 500 mg by mouth 2 (two) times daily.   dorzolamide 2 % ophthalmic solution Commonly known as:  TRUSOPT Place 1 drop into both eyes 3 (three)  times daily.   doxycycline 100 MG capsule Commonly known as:  VIBRAMYCIN Take 100 mg by mouth 2 (two) times daily. For 3 weeks - started 10/25/16   feeding supplement Liqd Take 1 Container by mouth 3 (three) times daily between meals.   fluocinonide cream 0.05 % Commonly known as:  LIDEX Apply 1 application topically 2 (two) times daily as needed (For rash).   folic acid 1 MG tablet Commonly known as:  FOLVITE Take 1 mg by mouth daily.   guaifenesin 100 MG/5ML syrup Commonly known as:  ROBITUSSIN Take 200 mg by mouth 3 (three) times daily as needed for cough or congestion.   LANTUS 100 UNIT/ML injection Generic drug:  insulin glargine Inject 12 Units into the skin at bedtime. Has been taking 12 units into skin before breakfast   loperamide 2 MG capsule Commonly known as:  IMODIUM Take 2 mg by mouth daily as needed for diarrhea or loose stools.   mirtazapine 7.5 MG tablet Commonly known as:  REMERON Take 7.5 mg by mouth at bedtime.   polyethylene glycol powder powder Commonly known as:  GLYCOLAX/MIRALAX Take 17 g by mouth 2 (two) times daily.   ranitidine 150 MG capsule Commonly known as:  ZANTAC Take 150 mg by mouth 2 (two) times daily.   sodium chloride 0.65 % Soln nasal spray Commonly known as:  OCEAN Place 2 sprays into both nostrils as needed for congestion. Reported on 02/14/2016   sorbitol 70 % solution Take 30 mLs by mouth daily as needed (constipation).   torsemide 20 MG tablet Commonly known as:  DEMADEX Take 40 mg by mouth daily.   XALATAN 0.005 % ophthalmic solution Generic drug:  latanoprost Place 1 drop into both eyes at bedtime.       Allergies:  Allergies  Allergen Reactions  . Penicillins Other (See Comments)    REACTION: causes hallucinations  . Sulfamethoxazole-Trimethoprim Swelling    REACTION: rash  . Bactrim     Unknown per mar  . Trimethoprim     Unknown per mar     Past Medical History:  Diagnosis Date  . Arthritis   .  Bacterial meningitis   . 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 Chronic abdominal pain   . Dementia   . Diverticulosis   . DJD (degenerative joint disease)   . DM (diabetes mellitus) ([place])   . Dyslipidemia   . Edema   . Esophageal stricture   . Gastritis, chronic   . GERD (gastroesophageal reflux disease)   . Hiatal hernia   . Hx of  adenomatous colonic polyps   . Hypertension   . Hypertensive cardiovascular disease   . Microcytic anemia   . Obesity   . Renal disorder     Past Surgical History:  Procedure Laterality Date  . APPENDECTOMY    . BACK SURGERY    . BILATERAL OOPHORECTOMY  1957  . CHOLECYSTECTOMY  1999  . COLONOSCOPY WITH PROPOFOL N/A 10/31/2016   Procedure: COLONOSCOPY WITH PROPOFOL;  Surgeon: Milus Banister, MD;  Location: [facility];  Service: Endoscopy;  Laterality: N/A;  . HERNIA REPAIR  08/6604   umbilical and ventral hernia repair by Dr. Margot Chimes  . ROTATOR CUFF REPAIR  7/05   right  . VESICOVAGINAL FISTULA CLOSURE W/ TAH  1957    Family History  Problem Relation Age of Onset  . Stroke Mother   . Cancer Father   . Hypertension Other     Social History:  reports that she has never smoked. She has never used smokeless tobacco. She reports that she does not drink alcohol or use drugs.    Review of Systems     Lipids: She is  not on any Statin drugs And usually lipids are fairly good  Lab Results  Component Value Date   CHOL 128 05/15/2016   HDL 41.80 05/15/2016   LDLCALC 66 05/15/2016   TRIG 98.0 05/15/2016   CHOLHDL 3 05/15/2016    RENAL insufficiency: Stable as below   She is taking diuretics including spironolactone. Blood pressure excellent   Lab Results  Component Value Date   CREATININE 1.45 (H) 11/11/2016   BUN 17 11/11/2016   NA 142 11/11/2016   K 3.9 11/11/2016   CL 107 11/11/2016   CO2 30 11/11/2016      Physical Examination:  BP (!) 120/56   Pulse 67   Ht 5' (1.524 m)   Wt 136  lb 12.8 oz (62.1 kg)   SpO2 96%   BMI 26.72 kg/m      ASSESSMENT:  Diabetes type 2 On insulin  See history of present illness for detailed discussion of his current management, blood sugar patterns and problems identified  Her fasting readings are excellent even with taking Lantus in the morning She tends to have high readings after breakfast/at lunchtime but not consistently any other time Has only one recent blood sugar recorded after suppertime She is probably eating better now since her weight is improving   PLAN:   Will restart 3 units NovoLog at breakfast Need to review her blood sugars in about a month and consider adding NovoLog at suppertime also No change in Lantus Instructions given for testing after supper 3 times a week Her son will look at her diet especially at breakfast  Patient Instructions  NovoLog 3 units  before breakfast daily  Stay on 12 Lantus  Must do a sugar test at bedtime 3x weekly     Candace Begue 01/02/2017, 11:11 AM    Note: This office note was prepared with Estate agent. Any transcriptional errors that result from this process are unintentional.

## 2017-01-02 NOTE — Patient Instructions (Addendum)
NovoLog 3 units  before breakfast daily  Stay on 12 Lantus  Must do a sugar test at bedtime 3x weekly

## 2017-01-07 ENCOUNTER — Telehealth: Payer: Self-pay | Admitting: Endocrinology

## 2017-01-07 NOTE — Telephone Encounter (Signed)
They need to find out if Humalog is preferred instead of NovoLog, not clear what the problem is

## 2017-01-07 NOTE — Telephone Encounter (Signed)
Please advise 

## 2017-01-07 NOTE — Telephone Encounter (Signed)
Patient is supposed to be getting Novolog 3 units before breakfast daily however, insurance declined. Is there another dosage or medication that can be prescribed? Please call and advise RN jennifer today.

## 2017-01-10 NOTE — Addendum Note (Signed)
Addendum  created 01/10/17 1247 by Konstantin Lehnen, MD   Sign clinical note    

## 2017-01-17 NOTE — Telephone Encounter (Signed)
Called number again and left a voice message to call our office if needs further assistance. Not sure if patients insurance covers Novolog or Humalog.

## 2017-02-13 ENCOUNTER — Telehealth: Payer: Self-pay | Admitting: Endocrinology

## 2017-02-13 NOTE — Telephone Encounter (Signed)
Pt brought sealed envelope with glucose reading for June 2018. Placed in front office doctor tray.

## 2017-02-17 ENCOUNTER — Telehealth: Payer: Self-pay | Admitting: Endocrinology

## 2017-02-17 NOTE — Telephone Encounter (Signed)
Please call [facility] and confirm that her blood sugars are still over 180 at bedtime this month, if so she will start with 3 units of NovoLog before suppertime daily in addition to breakfast dose Also she will reduce her Lantus from 12 units to 10 units

## 2017-02-17 NOTE — Telephone Encounter (Signed)
I have called both [facility] facilities in [place] and both places said patient is not there. I will try the one on [address] again. [phone] [address]

## 2017-02-18 NOTE — Telephone Encounter (Signed)
Called [facility] on [address] at [phone], had to leave a voice message for them to call us back so I can go over blood sugar readings and insulin adjustments with them.

## 2017-02-27 NOTE — Telephone Encounter (Signed)
The correct facility to call is [facility] at [phone], this is where Allison Rodgers is living!!!! I spoke to [place] and she asked me to send a fax over to [phone] with the new orders for insulin from Dr. Dwyane Dee.

## 2017-02-27 NOTE — Telephone Encounter (Signed)
Called [phone] [facility] [address] and spoke to [place] and she stated that I need to try calling [phone] [facility] to see if Tecia is at this building.

## 2017-03-04 ENCOUNTER — Ambulatory Visit (INDEPENDENT_AMBULATORY_CARE_PROVIDER_SITE_OTHER): Payer: Medicare Other | Admitting: Endocrinology

## 2017-03-04 ENCOUNTER — Encounter: Payer: Self-pay | Admitting: Endocrinology

## 2017-03-04 VITALS — BP 112/64 | HR 62 | Ht 60.0 in | Wt 150.2 lb

## 2017-03-04 DIAGNOSIS — Z794 Long term (current) use of insulin: Secondary | ICD-10-CM

## 2017-03-04 DIAGNOSIS — E1165 Type 2 diabetes mellitus with hyperglycemia: Secondary | ICD-10-CM

## 2017-03-04 LAB — COMPREHENSIVE METABOLIC PANEL
ALBUMIN: 3.4 g/dL — AB (ref 3.5–5.2)
ALK PHOS: 78 U/L (ref 39–117)
ALT: 9 U/L (ref 0–35)
AST: 17 U/L (ref 0–37)
BILIRUBIN TOTAL: 0.4 mg/dL (ref 0.2–1.2)
BUN: 41 mg/dL — ABNORMAL HIGH (ref 6–23)
CALCIUM: 9.3 mg/dL (ref 8.4–10.5)
CO2: 29 meq/L (ref 19–32)
Chloride: 105 mEq/L (ref 96–112)
Creatinine, Ser: 2.25 mg/dL — ABNORMAL HIGH (ref 0.40–1.20)
GFR: 26.19 mL/min — AB (ref >60.00)
Glucose, Bld: 118 mg/dL — ABNORMAL HIGH (ref 70–99)
Potassium: 4 mEq/L (ref 3.5–5.1)
Sodium: 140 mEq/L (ref 135–145)
Total Protein: 7.1 g/dL (ref 6.0–8.3)

## 2017-03-04 LAB — HEMOGLOBIN A1C: HEMOGLOBIN A1C: 6.5 % (ref 4.6–6.5)

## 2017-03-04 NOTE — Patient Instructions (Signed)
  INSTRUCTIONS for NURSING home: Please follow the following instructions  Patient needs to bring her medication list every time she comes  HUMALOG insulin: Start 3 units before supper time also daily and continue 3 units before breakfast  LANTUS insulin: Reduce the dose to 10 units  Please fax blood sugar readings on 03/26/17 to the following number: [phone]

## 2017-03-04 NOTE — Progress Notes (Signed)
Patient ID: Allison Rodgers, female   DOB: 07-Oct-1923, 83 y.o.   MRN: [id number]   Reason for Appointment : Followup for Type 2 Diabetes  History of Present llness          Diagnosis: Type 2 diabetes mellitus, date of diagnosis: ? 2007       Past history:  Apparently she had blood sugars over 500 at the time of diagnosis She was started on insulin at the time of diagnosis and has been continued on insulin with various regimens Also her weight had been previously significantly more, about 200 pounds Her blood sugars  were higher in late December/early January especially after meals, with highest 356.  With this she was started on NovoLog 5 units before meals at the end of January  Recent history: 7/18  INSULIN regimen: Lantus 12 units am, Humalog 3 units before breakfast  She had been on LANTUS twice a day along with small doses of Novolog at breakfast and supper However she was hospitalized in mid February and her insulin was changed to only once a day Lantus and NovoLog was stopped In April her Lantus was changed from evening to the morning and no NovoLog started  Blood sugar patterns, current problems and management:  She has gained back most of the weight that she had lost, likely to be eating larger portions  She has excellent fasting blood sugars and occasionally may be low normal but mostly over 100  Instructions were given to start Humalog insulin at suppertime by fax but the nursing home has not implemented this  This is confirmed by telephone conversation today  Blood sugars around lunchtime are usually fairly good with only occasional readings over 200  Blood sugars later at night are fairly consistently high with only a couple of readings below 200  Hemoglobin A1c: This tends to be lower than expected probably because of her renal insufficiency A1c in April was 5.3  Glucose monitoring:  done 2-3 times  a day.   Glucometer: ?  Used at nursing home       Blood Glucose readings from nursing home record recently   Mean values apply above for all meters except median for One Touch  PRE-MEAL Fasting Lunch Dinner Bedtime Overall  Glucose range:  81-1 42   1 29-249   159-379    Mean/median:           Hypoglycemia : None       Meals: 3 meals per day.  breakfast:  Egg,meat,  toast, juice   Physical activity: exercise: Trying to walk in her nursing home  Dietician consultations: Several years ago            Wt Readings from Last 3 Encounters:  03/04/17 150 lb 3.2 oz (68.1 kg)  01/02/17 136 lb 12.8 oz (62.1 kg)  11/14/16 131 lb (59.4 kg)   LABS:  Lab Results  Component Value Date   HGBA1C 5.3 11/11/2016   HGBA1C 5.5 08/15/2016   HGBA1C 5.2 05/15/2016   Lab Results  Component Value Date   MICROALBUR <0.7 11/15/2015   LDLCALC 66 05/15/2016   CREATININE 1.45 (H) 11/11/2016   Lab Results  Component Value Date   FRUCTOSAMINE 256 05/15/2016   FRUCTOSAMINE 296 (H) 11/15/2015   FRUCTOSAMINE 251 08/16/2013     Allergies as of 03/04/2017      Reactions   Penicillins Other (See Comments)   REACTION: causes hallucinations   Sulfamethoxazole-trimethoprim Swelling   REACTION: rash  Bactrim    Unknown per mar   Trimethoprim    Unknown per mar      Medication List       Accurate as of 03/04/17 11:14 AM. Always use your most recent med list.          acetaminophen 500 MG tablet Commonly known as:  TYLENOL Take 1 tablet (500 mg total) by mouth every 6 (six) hours as needed for moderate pain.   ALIGN 4 MG Caps Take 4 mg by mouth daily.   aspirin 81 MG chewable tablet Chew 81 mg by mouth daily.   cholecalciferol 400 units Tabs tablet Commonly known as:  VITAMIN D Take 800 Units by mouth daily.   clotrimazole-betamethasone cream Commonly known as:  LOTRISONE Apply 1 application topically 2 (two) times daily.   Cranberry 500 MG Caps Take 500 mg by mouth 2 (two) times daily.   dorzolamide 2 % ophthalmic  solution Commonly known as:  TRUSOPT Place 1 drop into both eyes 3 (three) times daily.   fluocinonide cream 0.05 % Commonly known as:  LIDEX Apply 1 application topically 2 (two) times daily as needed (For rash).   folic acid 1 MG tablet Commonly known as:  FOLVITE Take 1 mg by mouth daily.   guaifenesin 100 MG/5ML syrup Commonly known as:  ROBITUSSIN Take 200 mg by mouth 3 (three) times daily as needed for cough or congestion.   LANTUS 100 UNIT/ML injection Generic drug:  insulin glargine Inject 12 Units into the skin at bedtime. Has been taking 12 units into skin before breakfast   loperamide 2 MG capsule Commonly known as:  IMODIUM Take 2 mg by mouth daily as needed for diarrhea or loose stools.   mirtazapine 7.5 MG tablet Commonly known as:  REMERON Take 7.5 mg by mouth at bedtime.   polyethylene glycol powder powder Commonly known as:  GLYCOLAX/MIRALAX Take 17 g by mouth 2 (two) times daily.   ranitidine 150 MG capsule Commonly known as:  ZANTAC Take 150 mg by mouth 2 (two) times daily.   sodium chloride 0.65 % Soln nasal spray Commonly known as:  OCEAN Place 2 sprays into both nostrils as needed for congestion. Reported on 02/14/2016   sorbitol 70 % solution Take 30 mLs by mouth daily as needed (constipation).   torsemide 20 MG tablet Commonly known as:  DEMADEX Take 40 mg by mouth daily.   XALATAN 0.005 % ophthalmic solution Generic drug:  latanoprost Place 1 drop into both eyes at bedtime.       Allergies:  Allergies  Allergen Reactions  . Penicillins Other (See Comments)    REACTION: causes hallucinations  . Sulfamethoxazole-Trimethoprim Swelling    REACTION: rash  . Bactrim     Unknown per mar  . Trimethoprim     Unknown per mar     Past Medical History:  Diagnosis Date  . Arthritis   . Bacterial meningitis   . 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 Chronic abdominal pain   . Dementia   .  Diverticulosis   . DJD (degenerative joint disease)   . DM (diabetes mellitus) ([place])   . Dyslipidemia   . Edema   . Esophageal stricture   . Gastritis, chronic   . GERD (gastroesophageal reflux disease)   . Hiatal hernia   . Hx of adenomatous colonic polyps   . Hypertension   . Hypertensive cardiovascular disease   . Microcytic anemia   .  Obesity   . Renal disorder     Past Surgical History:  Procedure Laterality Date  . APPENDECTOMY    . BACK SURGERY    . BILATERAL OOPHORECTOMY  1957  . CHOLECYSTECTOMY  1999  . COLONOSCOPY WITH PROPOFOL N/A 10/31/2016   Procedure: COLONOSCOPY WITH PROPOFOL;  Surgeon: Milus Banister, MD;  Location: [facility];  Service: Endoscopy;  Laterality: N/A;  . HERNIA REPAIR  12/6385   umbilical and ventral hernia repair by Dr. Margot Chimes  . ROTATOR CUFF REPAIR  7/05   right  . VESICOVAGINAL FISTULA CLOSURE W/ TAH  1957    Family History  Problem Relation Age of Onset  . Stroke Mother   . Cancer Father   . Hypertension Other     Social History:  reports that she has never smoked. She has never used smokeless tobacco. She reports that she does not drink alcohol or use drugs.    Review of Systems     Lipids: She is not on any Statin drugs, Has not had a follow-up recently   Lab Results  Component Value Date   CHOL 128 05/15/2016   HDL 41.80 05/15/2016   LDLCALC 66 05/15/2016   TRIG 98.0 05/15/2016   CHOLHDL 3 05/15/2016    RENAL insufficiency: Chronic, not followed by nephrologist, creatinine as below   She is taking diuretics including spironolactone.   Lab Results  Component Value Date   CREATININE 1.45 (H) 11/11/2016   BUN 17 11/11/2016   NA 142 11/11/2016   K 3.9 11/11/2016   CL 107 11/11/2016   CO2 30 11/11/2016      Physical Examination:  BP 112/64   Pulse 62   Ht 5' (1.524 m)   Wt 150 lb 3.2 oz (68.1 kg)   BMI 29.33 kg/m      ASSESSMENT:  Diabetes type 2 On insulin  See history of present illness for  detailed discussion of his current management, blood sugar patterns and problems identified  Her fasting readings are excellent With once a day Lantus in the morning even with taking Lantus in the morning She tends to have high readings after her evening meal although blood sugars are not being monitored after lunch She is doing well with Humalog 3 units before breakfast Although she was supposed to start Humalog at suppertime the nursing home has not followed instructions and blood sugars are mostly 200 after evening meal This was discussed with the staff at the nursing home today She may be requiring more insulin because of her regaining most of her weight that she had lost   PLAN:   Will restart 3 units Humalog at dinnertime also Reduce Lantus by 2 units and she will take 10 units to avoid potential overnight hypoglycemia Discussed that she needs to watch her snacks and portions so she does not continue to gain excessive weight Will need to review her blood sugars by fax in about a month All instructions explained to her son and orders were signed She needs to bring her medication list on each follow-up which she has not done  Total visit time = 25 minutes  Patient Instructions   INSTRUCTIONS for NURSING home: Please follow the following instructions  Patient needs to bring her medication list every time she comes  HUMALOG insulin: Start 3 units before supper time also daily and continue 3 units before breakfast  LANTUS insulin: Reduce the dose to 10 units  Please fax blood sugar readings on 03/26/17 to the  following number: [phone]     VKPQA,ESLP 03/04/2017, 11:14 AM    Note: This office note was prepared with Dragon voice recognition system technology. Any transcriptional errors that result from this process are unintentional.

## 2017-03-05 LAB — MICROALBUMIN / CREATININE URINE RATIO
CREATININE, U: 136.8 mg/dL
MICROALB UR: 0.8 mg/dL (ref 0.0–1.9)
MICROALB/CREAT RATIO: 0.6 mg/g (ref 0.0–30.0)

## 2017-03-05 NOTE — Addendum Note (Signed)
Addended by: Kaylyn Lim I on: 03/05/2017 02:36 PM   Modules accepted: Orders

## 2017-03-06 NOTE — Progress Notes (Signed)
Please call to let son know that the kidney test looks significantly worse than before, he needs to discuss with her PCP

## 2017-05-05 ENCOUNTER — Ambulatory Visit (INDEPENDENT_AMBULATORY_CARE_PROVIDER_SITE_OTHER): Payer: Medicare Other | Admitting: Endocrinology

## 2017-05-05 ENCOUNTER — Encounter: Payer: Self-pay | Admitting: Endocrinology

## 2017-05-05 VITALS — BP 130/74 | HR 56 | Ht 60.0 in | Wt 158.6 lb

## 2017-05-05 DIAGNOSIS — N184 Chronic kidney disease, stage 4 (severe): Secondary | ICD-10-CM

## 2017-05-05 DIAGNOSIS — E1165 Type 2 diabetes mellitus with hyperglycemia: Secondary | ICD-10-CM

## 2017-05-05 DIAGNOSIS — Z794 Long term (current) use of insulin: Secondary | ICD-10-CM | POA: Diagnosis not present

## 2017-05-05 LAB — BASIC METABOLIC PANEL
BUN: 29 mg/dL — ABNORMAL HIGH (ref 6–23)
CALCIUM: 9.4 mg/dL (ref 8.4–10.5)
CHLORIDE: 111 meq/L (ref 96–112)
CO2: 27 meq/L (ref 19–32)
Creatinine, Ser: 1.9 mg/dL — ABNORMAL HIGH (ref 0.40–1.20)
GFR: 31.82 mL/min — ABNORMAL LOW (ref >60.00)
Glucose, Bld: 151 mg/dL — ABNORMAL HIGH (ref 70–99)
Potassium: 4.4 mEq/L (ref 3.5–5.1)
SODIUM: 144 meq/L (ref 135–145)

## 2017-05-05 LAB — LIPID PANEL
CHOL/HDL RATIO: 2
Cholesterol: 130 mg/dL (ref 0–200)
HDL: 59.5 mg/dL (ref >39.00)
LDL CALC: 56 mg/dL (ref 0–99)
NonHDL: 70.27
TRIGLYCERIDES: 71 mg/dL (ref 0.0–149.0)
VLDL: 14.2 mg/dL (ref 0.0–40.0)

## 2017-05-05 NOTE — Patient Instructions (Signed)
NEW INSTRUCTIONS  Glucose monitoring  1.  On Mondays, Wednesdays and Fridays check her blood sugar at breakfast, suppertime and bedtime 2.  On Tuesdays, Thursdays and weekends: Check her blood sugar at lunchtime, suppertime and bedtime  HUMALOG: She needs to have 3 units before EVERY meal instead of just before breakfast and lunch  Continue 8 units of Lantus in the morning  Recommend stopping PREDNISONE unless absolutely necessary, please check with her primary doctor  Please fax blood sugars in 3 weeks to [phone]

## 2017-05-05 NOTE — Progress Notes (Signed)
Patient ID: Allison Rodgers, female   DOB: August 28, 1923, 83 y.o.   MRN: [id number]   Reason for Appointment : Followup for Type 2 Diabetes  History of Present llness          Diagnosis: Type 2 diabetes mellitus, date of diagnosis: ? 2007       Past history:  Apparently she had blood sugars over 500 at the time of diagnosis She was started on insulin at the time of diagnosis and has been continued on insulin with various regimens Also her weight had been previously significantly more, about 200 pounds Her blood sugars  were higher in late December/early January especially after meals, with highest 356.  With this she was started on NovoLog 5 units before meals at the end of January  Recent history:   INSULIN regimen: Lantus 8units am, Humalog 3 units before breakfast and lunch  She had been on LANTUS along with small doses of Novolog at breakfast and supper  Blood sugar patterns, current problems and management:  She had been given instructions to have the nursing home give her insulin at suppertime also on her last visit but apparently this is not being done  She is supposed to be taking 10 units of Lantus but she is getting only 8 units  She has been eating fairly liberally and her weight continues to increase, her son things that she will eat a little cereal for a snack later in the evening but not a large amount  FASTING blood sugars are excellent however even with the lower dose of Lantus  Blood sugars are usually not high at lunchtime  Blood sugars at night are fairly consistently high   Hemoglobin A1c: This tends to be lower than expected probably because of her renal insufficiency A1c in July previously was 6.5, higher than usual  Glucose monitoring:  done 2-3 times  a day.   Glucometer: ?  Used at nursing home     Blood Glucose readings from nursing home record recently   Mean values apply above for all meters except median for One Touch  PRE-MEAL Fasting  Lunch Dinner Bedtime Overall  Glucose range:  98-1 39   99-234  ?    1 93-251    Mean/median:           Hypoglycemia : None       Meals: 3 meals per day.  breakfast:  Egg,meat,  toast, juice   Physical activity: exercise: Trying to walk in her nursing home  Dietician consultations: Several years ago            Wt Readings from Last 3 Encounters:  05/05/17 158 lb 9.6 oz (71.9 kg)  03/04/17 150 lb 3.2 oz (68.1 kg)  01/02/17 136 lb 12.8 oz (62.1 kg)   LABS:  Lab Results  Component Value Date   HGBA1C 6.5 03/04/2017   HGBA1C 5.3 11/11/2016   HGBA1C 5.5 08/15/2016   Lab Results  Component Value Date   MICROALBUR 0.8 03/05/2017   LDLCALC 66 05/15/2016   CREATININE 2.25 (H) 03/04/2017   Lab Results  Component Value Date   FRUCTOSAMINE 256 05/15/2016   FRUCTOSAMINE 296 (H) 11/15/2015   FRUCTOSAMINE 251 08/16/2013     Allergies as of 05/05/2017      Reactions   Penicillins Other (See Comments)   REACTION: causes hallucinations   Sulfamethoxazole-trimethoprim Swelling   REACTION: rash   Bactrim    Unknown per mar   Trimethoprim  Unknown per mar      Medication List       Accurate as of 05/05/17  4:35 PM. Always use your most recent med list.          acetaminophen 500 MG tablet Commonly known as:  TYLENOL Take 1 tablet (500 mg total) by mouth every 6 (six) hours as needed for moderate pain.   ALIGN 4 MG Caps Take 4 mg by mouth daily.   aspirin 81 MG chewable tablet Chew 81 mg by mouth daily.   cholecalciferol 400 units Tabs tablet Commonly known as:  VITAMIN D Take 800 Units by mouth daily.   clotrimazole-betamethasone cream Commonly known as:  LOTRISONE Apply 1 application topically 2 (two) times daily.   Cranberry 500 MG Caps Take 500 mg by mouth 2 (two) times daily.   dorzolamide 2 % ophthalmic solution Commonly known as:  TRUSOPT Place 1 drop into both eyes 3 (three) times daily.   fluocinonide cream 0.05 % Commonly known as:   LIDEX Apply 1 application topically 2 (two) times daily as needed (For rash).   folic acid 1 MG tablet Commonly known as:  FOLVITE Take 1 mg by mouth daily.   guaifenesin 100 MG/5ML syrup Commonly known as:  ROBITUSSIN Take 200 mg by mouth 3 (three) times daily as needed for cough or congestion.   HUMALOG KWIKPEN 100 UNIT/ML KiwkPen Generic drug:  insulin lispro Inject 3 Units into the skin 2 (two) times daily. Inject 3 units in the morning and 3 units before lunch   LANTUS 100 UNIT/ML injection Generic drug:  insulin glargine Inject 8 Units into the skin at bedtime. Has been taking 8 units into skin before breakfast   loperamide 2 MG capsule Commonly known as:  IMODIUM Take 2 mg by mouth daily as needed for diarrhea or loose stools.   mirtazapine 7.5 MG tablet Commonly known as:  REMERON Take 7.5 mg by mouth at bedtime.   polyethylene glycol powder powder Commonly known as:  GLYCOLAX/MIRALAX Take 17 g by mouth 2 (two) times daily.   predniSONE 5 MG tablet Commonly known as:  DELTASONE Take 5 mg by mouth 2 (two) times daily with a meal.   ranitidine 150 MG capsule Commonly known as:  ZANTAC Take 150 mg by mouth 2 (two) times daily.   sodium chloride 0.65 % Soln nasal spray Commonly known as:  OCEAN Place 2 sprays into both nostrils as needed for congestion. Reported on 02/14/2016   sorbitol 70 % solution Take 30 mLs by mouth daily as needed (constipation).   torsemide 20 MG tablet Commonly known as:  DEMADEX Take 40 mg by mouth daily.   XALATAN 0.005 % ophthalmic solution Generic drug:  latanoprost Place 1 drop into both eyes at bedtime.            Discharge Care Instructions        Start     Ordered   05/05/17 5462  Basic metabolic panel     70/35/00 1128   05/05/17 0000  Fructosamine     05/05/17 1128   05/05/17 0000  Lipid panel     05/05/17 1128      Allergies:  Allergies  Allergen Reactions  . Penicillins Other (See Comments)     REACTION: causes hallucinations  . Sulfamethoxazole-Trimethoprim Swelling    REACTION: rash  . Bactrim     Unknown per mar  . Trimethoprim     Unknown per mar     Past Medical  History:  Diagnosis Date  . Arthritis   . Bacterial meningitis   . 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 Chronic abdominal pain   . Dementia   . Diverticulosis   . DJD (degenerative joint disease)   . DM (diabetes mellitus) ([place])   . Dyslipidemia   . Edema   . Esophageal stricture   . Gastritis, chronic   . GERD (gastroesophageal reflux disease)   . Hiatal hernia   . Hx of adenomatous colonic polyps   . Hypertension   . Hypertensive cardiovascular disease   . Microcytic anemia   . Obesity   . Renal disorder     Past Surgical History:  Procedure Laterality Date  . APPENDECTOMY    . BACK SURGERY    . BILATERAL OOPHORECTOMY  1957  . CHOLECYSTECTOMY  1999  . COLONOSCOPY WITH PROPOFOL N/A 10/31/2016   Procedure: COLONOSCOPY WITH PROPOFOL;  Surgeon: Milus Banister, MD;  Location: [facility];  Service: Endoscopy;  Laterality: N/A;  . HERNIA REPAIR  02/7823   umbilical and ventral hernia repair by Dr. Margot Chimes  . ROTATOR CUFF REPAIR  7/05   right  . VESICOVAGINAL FISTULA CLOSURE W/ TAH  1957    Family History  Problem Relation Age of Onset  . Stroke Mother   . Cancer Father   . Hypertension Other     Social History:  reports that she has never smoked. She has never used smokeless tobacco. She reports that she does not drink alcohol or use drugs.    Review of Systems     Lipids: She is not on any Statin drugs, Has not had a follow-up recently   Lab Results  Component Value Date   CHOL 128 05/15/2016   HDL 41.80 05/15/2016   LDLCALC 66 05/15/2016   TRIG 98.0 05/15/2016   CHOLHDL 3 05/15/2016    RENAL insufficiency: Chronic, not followed by nephrologist, creatinine was higher on her last visit and apparently has been checked by PCP, no nephrology  consultation has been done   She is taking diuretics including spironolactone.   Lab Results  Component Value Date   CREATININE 2.25 (H) 03/04/2017   BUN 41 (H) 03/04/2017   NA 140 03/04/2017   K 4.0 03/04/2017   CL 105 03/04/2017   CO2 29 03/04/2017      Physical Examination:  BP 130/74   Pulse (!) 56   Ht 5' (1.524 m)   Wt 158 lb 9.6 oz (71.9 kg)   SpO2 98%   BMI 30.97 kg/m      ASSESSMENT:  Diabetes type 2 On insulin  See history of present illness for detailed discussion of his current management, blood sugar patterns and problems identified  Her fasting readings are excellent With a reduced dose of Lantus She tends to have high readings after her evening meal and has not been given her Humalog as instructed by nursing home Blood sugars are fairly good at lunch but not clear what her readings are at suppertime, these are being missed by the nursing:  Current management was discussed with her son  RENAL dysfunction: Needs follow-up and also should be followed by nephrologist regularly   PLAN:   Will restart 3 units Humalog at dinnertime also She'll continue the reduced dose of Lantus Nephrology consultation to evaluate chronic renal failure and associated conditions  Written instructions given about monitoring blood sugars to include reading before supper also Will get with her  readings by fax in about 3 weeks, request has been sent and hopefully the nursing home will comply  Patient Instructions  NEW INSTRUCTIONS  Glucose monitoring  1.  On Mondays, Wednesdays and Fridays check her blood sugar at breakfast, suppertime and bedtime 2.  On Tuesdays, Thursdays and weekends: Check her blood sugar at lunchtime, suppertime and bedtime  HUMALOG: She needs to have 3 units before EVERY meal instead of just before breakfast and lunch  Continue 8 units of Lantus in the morning  Recommend stopping PREDNISONE unless absolutely necessary, please check with her  primary doctor  Please fax blood sugars in 3 weeks to [phone]     [facility] 05/05/2017, 4:35 PM    Note: This office note was prepared with Estate agent. Any transcriptional errors that result from this process are unintentional.

## 2017-05-06 LAB — FRUCTOSAMINE: FRUCTOSAMINE: 309 umol/L — AB (ref 0–285)

## 2017-05-24 ENCOUNTER — Inpatient Hospital Stay (HOSPITAL_COMMUNITY)
Admission: EM | Admit: 2017-05-24 | Discharge: 2017-05-29 | DRG: 391 | Disposition: A | Discharge status: Skilled Nursing Facility | Payer: Medicare Other | Attending: Internal Medicine | Admitting: Internal Medicine

## 2017-05-24 ENCOUNTER — Emergency Department (HOSPITAL_COMMUNITY): Payer: Medicare Other

## 2017-05-24 ENCOUNTER — Encounter (HOSPITAL_COMMUNITY): Payer: Self-pay

## 2017-05-24 DIAGNOSIS — K3184 Gastroparesis: Secondary | ICD-10-CM | POA: Diagnosis present

## 2017-05-24 DIAGNOSIS — E119 Type 2 diabetes mellitus without complications: Secondary | ICD-10-CM

## 2017-05-24 DIAGNOSIS — E785 Hyperlipidemia, unspecified: Secondary | ICD-10-CM | POA: Diagnosis present

## 2017-05-24 DIAGNOSIS — E1143 Type 2 diabetes mellitus with diabetic autonomic (poly)neuropathy: Secondary | ICD-10-CM | POA: Diagnosis present

## 2017-05-24 DIAGNOSIS — E1122 Type 2 diabetes mellitus with diabetic chronic kidney disease: Secondary | ICD-10-CM | POA: Diagnosis present

## 2017-05-24 DIAGNOSIS — Z794 Long term (current) use of insulin: Secondary | ICD-10-CM

## 2017-05-24 DIAGNOSIS — Z888 Allergy status to other drugs, medicaments and biological substances status: Secondary | ICD-10-CM

## 2017-05-24 DIAGNOSIS — Z809 Family history of malignant neoplasm, unspecified: Secondary | ICD-10-CM

## 2017-05-24 DIAGNOSIS — Z9049 Acquired absence of other specified parts of digestive tract: Secondary | ICD-10-CM

## 2017-05-24 DIAGNOSIS — E1121 Type 2 diabetes mellitus with diabetic nephropathy: Secondary | ICD-10-CM | POA: Diagnosis not present

## 2017-05-24 DIAGNOSIS — K5900 Constipation, unspecified: Secondary | ICD-10-CM

## 2017-05-24 DIAGNOSIS — K5792 Diverticulitis of intestine, part unspecified, without perforation or abscess without bleeding: Secondary | ICD-10-CM | POA: Diagnosis not present

## 2017-05-24 DIAGNOSIS — J189 Pneumonia, unspecified organism: Secondary | ICD-10-CM | POA: Diagnosis present

## 2017-05-24 DIAGNOSIS — Z7952 Long term (current) use of systemic steroids: Secondary | ICD-10-CM

## 2017-05-24 DIAGNOSIS — Z90710 Acquired absence of both cervix and uterus: Secondary | ICD-10-CM

## 2017-05-24 DIAGNOSIS — G9340 Encephalopathy, unspecified: Secondary | ICD-10-CM | POA: Diagnosis present

## 2017-05-24 DIAGNOSIS — Z882 Allergy status to sulfonamides status: Secondary | ICD-10-CM

## 2017-05-24 DIAGNOSIS — K5732 Diverticulitis of large intestine without perforation or abscess without bleeding: Secondary | ICD-10-CM | POA: Diagnosis present

## 2017-05-24 DIAGNOSIS — Z881 Allergy status to other antibiotic agents status: Secondary | ICD-10-CM

## 2017-05-24 DIAGNOSIS — H409 Unspecified glaucoma: Secondary | ICD-10-CM | POA: Diagnosis present

## 2017-05-24 DIAGNOSIS — Z6833 Body mass index (BMI) 33.0-33.9, adult: Secondary | ICD-10-CM | POA: Diagnosis not present

## 2017-05-24 DIAGNOSIS — J181 Lobar pneumonia, unspecified organism: Secondary | ICD-10-CM | POA: Diagnosis not present

## 2017-05-24 DIAGNOSIS — Z823 Family history of stroke: Secondary | ICD-10-CM | POA: Diagnosis not present

## 2017-05-24 DIAGNOSIS — N390 Urinary tract infection, site not specified: Secondary | ICD-10-CM | POA: Diagnosis present

## 2017-05-24 DIAGNOSIS — I120 Hypertensive chronic kidney disease with stage 5 chronic kidney disease or end stage renal disease: Secondary | ICD-10-CM | POA: Diagnosis present

## 2017-05-24 DIAGNOSIS — Z880 Allergy status to penicillin: Secondary | ICD-10-CM

## 2017-05-24 DIAGNOSIS — F0390 Unspecified dementia without behavioral disturbance: Secondary | ICD-10-CM | POA: Diagnosis present

## 2017-05-24 DIAGNOSIS — Z66 Do not resuscitate: Secondary | ICD-10-CM | POA: Diagnosis present

## 2017-05-24 DIAGNOSIS — Z87440 Personal history of urinary (tract) infections: Secondary | ICD-10-CM

## 2017-05-24 DIAGNOSIS — G8929 Other chronic pain: Secondary | ICD-10-CM | POA: Diagnosis present

## 2017-05-24 DIAGNOSIS — N185 Chronic kidney disease, stage 5: Secondary | ICD-10-CM | POA: Diagnosis present

## 2017-05-24 DIAGNOSIS — K219 Gastro-esophageal reflux disease without esophagitis: Secondary | ICD-10-CM | POA: Diagnosis present

## 2017-05-24 DIAGNOSIS — Z90722 Acquired absence of ovaries, bilateral: Secondary | ICD-10-CM

## 2017-05-24 DIAGNOSIS — R110 Nausea: Secondary | ICD-10-CM

## 2017-05-24 DIAGNOSIS — Z86010 Personal history of colonic polyps: Secondary | ICD-10-CM

## 2017-05-24 LAB — GLUCOSE, CAPILLARY: GLUCOSE-CAPILLARY: 97 mg/dL (ref 65–99)

## 2017-05-24 LAB — CBC WITH DIFFERENTIAL/PLATELET
BASOS ABS: 0 10*3/uL (ref 0.0–0.1)
BASOS PCT: 0 %
EOS PCT: 2 %
Eosinophils Absolute: 0.2 10*3/uL (ref 0.0–0.7)
HEMATOCRIT: 36.1 % (ref 36.0–46.0)
Hemoglobin: 11.1 g/dL — ABNORMAL LOW (ref 12.0–15.0)
Lymphocytes Relative: 20 %
Lymphs Abs: 1.6 10*3/uL (ref 0.7–4.0)
MCH: 23.5 pg — ABNORMAL LOW (ref 26.0–34.0)
MCHC: 30.7 g/dL (ref 30.0–36.0)
MCV: 76.5 fL — AB (ref 78.0–100.0)
MONO ABS: 0.7 10*3/uL (ref 0.1–1.0)
MONOS PCT: 9 %
Neutro Abs: 5.3 10*3/uL (ref 1.7–7.7)
Neutrophils Relative %: 69 %
PLATELETS: 172 10*3/uL (ref 150–400)
RBC: 4.72 MIL/uL (ref 3.87–5.11)
RDW: 14.8 % (ref 11.5–15.5)
WBC: 7.7 10*3/uL (ref 4.0–10.5)

## 2017-05-24 LAB — COMPREHENSIVE METABOLIC PANEL
ALBUMIN: 3.2 g/dL — AB (ref 3.5–5.0)
ALT: 15 U/L (ref 14–54)
ANION GAP: 10 (ref 5–15)
AST: 38 U/L (ref 15–41)
Alkaline Phosphatase: 86 U/L (ref 38–126)
BILIRUBIN TOTAL: 0.7 mg/dL (ref 0.3–1.2)
BUN: 22 mg/dL — AB (ref 6–20)
CHLORIDE: 109 mmol/L (ref 101–111)
CO2: 23 mmol/L (ref 22–32)
Calcium: 9 mg/dL (ref 8.9–10.3)
Creatinine, Ser: 1.87 mg/dL — ABNORMAL HIGH (ref 0.44–1.00)
GFR calc Af Amer: 26 mL/min — ABNORMAL LOW (ref >60)
GFR, EST NON AFRICAN AMERICAN: 22 mL/min — AB (ref >60)
Glucose, Bld: 118 mg/dL — ABNORMAL HIGH (ref 65–99)
POTASSIUM: 4.7 mmol/L (ref 3.5–5.1)
Sodium: 142 mmol/L (ref 135–145)
TOTAL PROTEIN: 7.1 g/dL (ref 6.5–8.1)

## 2017-05-24 LAB — URINALYSIS, ROUTINE W REFLEX MICROSCOPIC
Bilirubin Urine: NEGATIVE
GLUCOSE, UA: NEGATIVE mg/dL
KETONES UR: 5 mg/dL — AB
Leukocytes, UA: NEGATIVE
Nitrite: NEGATIVE
PH: 5 (ref 5.0–8.0)
SQUAMOUS EPITHELIAL / LPF: NONE SEEN
Specific Gravity, Urine: 1.015 (ref 1.005–1.030)

## 2017-05-24 LAB — AMMONIA: AMMONIA: 20 umol/L (ref 9–35)

## 2017-05-24 LAB — I-STAT CG4 LACTIC ACID, ED: LACTIC ACID, VENOUS: 1.09 mmol/L (ref 0.5–1.9)

## 2017-05-24 MED ORDER — CIPROFLOXACIN IN D5W 400 MG/200ML IV SOLN
400.0000 mg | Freq: Once | INTRAVENOUS | Status: AC
  Administered 2017-05-25: 400 mg via INTRAVENOUS
  Filled 2017-05-24: qty 200

## 2017-05-24 MED ORDER — LATANOPROST 0.005 % OP SOLN
1.0000 [drp] | Freq: Every day | OPHTHALMIC | Status: DC
  Administered 2017-05-24 – 2017-05-28 (×4): 1 [drp] via OPHTHALMIC
  Filled 2017-05-24: qty 2.5

## 2017-05-24 MED ORDER — INSULIN GLARGINE 100 UNIT/ML ~~LOC~~ SOLN
5.0000 [IU] | Freq: Every day | SUBCUTANEOUS | Status: DC
  Administered 2017-05-25 – 2017-05-28 (×5): 5 [IU] via SUBCUTANEOUS
  Filled 2017-05-24 (×6): qty 0.05

## 2017-05-24 MED ORDER — INSULIN ASPART 100 UNIT/ML ~~LOC~~ SOLN
0.0000 [IU] | Freq: Three times a day (TID) | SUBCUTANEOUS | Status: DC
  Administered 2017-05-25 – 2017-05-26 (×2): 1 [IU] via SUBCUTANEOUS
  Administered 2017-05-26: 2 [IU] via SUBCUTANEOUS
  Administered 2017-05-27 (×2): 1 [IU] via SUBCUTANEOUS
  Administered 2017-05-28 – 2017-05-29 (×6): 2 [IU] via SUBCUTANEOUS

## 2017-05-24 MED ORDER — FAMOTIDINE 20 MG PO TABS
20.0000 mg | ORAL_TABLET | Freq: Two times a day (BID) | ORAL | Status: DC
  Administered 2017-05-25: 20 mg via ORAL
  Filled 2017-05-24 (×2): qty 1

## 2017-05-24 MED ORDER — METRONIDAZOLE IN NACL 5-0.79 MG/ML-% IV SOLN: 500.0000 mg | Freq: Once | INTRAVENOUS | Status: DC

## 2017-05-24 MED ORDER — ACETAMINOPHEN 500 MG PO TABS
500.0000 mg | ORAL_TABLET | Freq: Four times a day (QID) | ORAL | Status: DC | PRN
  Filled 2017-05-24: qty 1

## 2017-05-24 MED ORDER — DEXTROSE-NACL 5-0.45 % IV SOLN
INTRAVENOUS | Status: DC
  Administered 2017-05-24: via INTRAVENOUS

## 2017-05-24 MED ORDER — FOLIC ACID 1 MG PO TABS
1.0000 mg | ORAL_TABLET | Freq: Every day | ORAL | Status: DC
  Administered 2017-05-25 – 2017-05-29 (×4): 1 mg via ORAL
  Filled 2017-05-24 (×4): qty 1

## 2017-05-24 MED ORDER — HEPARIN SODIUM (PORCINE) 5000 UNIT/ML IJ SOLN
5000.0000 [IU] | Freq: Two times a day (BID) | INTRAMUSCULAR | Status: DC
  Administered 2017-05-25 – 2017-05-29 (×10): 5000 [IU] via SUBCUTANEOUS
  Filled 2017-05-24 (×10): qty 1

## 2017-05-24 MED ORDER — MIRTAZAPINE 15 MG PO TABS
15.0000 mg | ORAL_TABLET | Freq: Every day | ORAL | Status: DC
  Administered 2017-05-25 – 2017-05-28 (×3): 15 mg via ORAL
  Filled 2017-05-24 (×4): qty 1

## 2017-05-24 MED ORDER — CIPROFLOXACIN IN D5W 200 MG/100ML IV SOLN
200.0000 mg | Freq: Once | INTRAVENOUS | Status: DC
  Filled 2017-05-24: qty 100

## 2017-05-24 MED ORDER — METRONIDAZOLE IN NACL 5-0.79 MG/ML-% IV SOLN
500.0000 mg | Freq: Three times a day (TID) | INTRAVENOUS | Status: DC
  Administered 2017-05-24 – 2017-05-26 (×5): 500 mg via INTRAVENOUS
  Filled 2017-05-24 (×7): qty 100

## 2017-05-24 NOTE — ED Notes (Signed)
Pt transported to CT ?

## 2017-05-24 NOTE — ED Notes (Signed)
Called to give report. They state that the nurse will call me back to get report.

## 2017-05-24 NOTE — ED Notes (Signed)
Bed: OL41 Expected date:  Expected time:  Means of arrival:  Comments: 83 yo UTI

## 2017-05-24 NOTE — ED Notes (Signed)
Hospitalist at bedside 

## 2017-05-24 NOTE — ED Provider Notes (Signed)
[facility] DEPT Provider Note   CSN: [id number] Arrival date & time: 05/24/17  1458     History   Chief Complaint Chief Complaint  Patient presents with  . Dysuria    HPI Allison Rodgers is a 83 y.o. female.  Level V caveat: Dementia and AMS   Allison Rodgers is a 83 y.o. Female who presents to the ED from her nursing facility, [facility], complaining of suprapubic abdominal pain. She has a history of dementia according to nursing staff. I spoke with nursing facility who reports she was started on Levaquin 4 days ago for urinary tract infection. They report over the past several days she's become more confused. Typically she is demented, however she is able to do the activities of her daily living. Reports she's not been able to feed herself and is been confused about where to go to the bathroom. She's been complaining of suprapubic abdominal pain. She's been on the Levaquin for about 4 days now. Nursing facility also reports that yesterday morning they noticed a bruise to the right side of her forehead. No known fall. They did find her sitting in her closet that morning. She is not on anticoagulants. She reports she is just not acting herself recently. Patient only complains of suprapubic abdominal pain. She was not evaluated after her fall.    The history is provided by the patient, the nursing home and medical records.  Dysuria      Past Medical History:  Diagnosis Date  . Arthritis   . Bacterial meningitis   . 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 Chronic abdominal pain   . Dementia   . Diverticulosis   . DJD (degenerative joint disease)   . DM (diabetes mellitus) ([place])   . Dyslipidemia   . Edema   . Esophageal stricture   . Gastritis, chronic   . GERD (gastroesophageal reflux disease)   . Hiatal hernia   . Hx of adenomatous colonic polyps   . Hypertension   . Hypertensive cardiovascular disease   . Microcytic anemia   .  Obesity   . Renal disorder     Patient Active Problem List   Diagnosis Date Noted  . Abdominal pain, left lower quadrant   . Colovaginal fistula   . Protein-calorie malnutrition, severe 09/22/2016  . AKI (acute kidney injury) ([place]) 09/21/2016  . Protein-calorie malnutrition ([place]) 09/21/2016  . Diverticulitis 09/18/2016  . Moderate dementia 10/31/2014  . Health care maintenance 02/18/2014  . Other malaise and fatigue 11/22/2013  . Vaginal discharge 11/22/2013  . Gastroparesis 08/23/2013  . Abdominal pain, other specified site 07/13/2013  . Chronic renal failure 05/29/2013  . Volume depletion 05/29/2013  . Type II or unspecified type diabetes mellitus with renal manifestations, uncontrolled(250.42) 05/10/2013  . 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 Hyponatremia 05/10/2013  . Glaucoma 05/10/2013  . Type II or unspecified type diabetes mellitus with neurological manifestations, uncontrolled(250.62) 04/20/2013  . Hypokalemia 04/06/2013  . Candida rash of groin 04/06/2013  . Acute encephalopathy 04/02/2013  . Meningitis 04/02/2013  . Chronic kidney disease, stage IV (severe) ([place]) 04/02/2013  . BENIGN NEOPLASM [place] DIGESTIVE SYSTEM 01/11/2008  . ESOPHAGEAL STRICTURE 01/11/2008  . GERD 10/29/2007  . Diabetes mellitus type 2, controlled ([place]) 06/24/2007  . Morbid obesity ([place]) 06/24/2007  . HYPERTENSION 06/24/2007    Past Surgical History:  Procedure Laterality Date  . APPENDECTOMY    . BACK SURGERY    .  BILATERAL OOPHORECTOMY  1957  . CHOLECYSTECTOMY  1999  . COLONOSCOPY WITH PROPOFOL N/A 10/31/2016   Procedure: COLONOSCOPY WITH PROPOFOL;  Surgeon: Milus Banister, MD;  Location: [facility] ENDOSCOPY;  Service: Endoscopy;  Laterality: N/A;  . HERNIA REPAIR  12/91   umbilical and ventral hernia repair by Dr. Margot Chimes  . ROTATOR CUFF REPAIR  7/05   right  . VESICOVAGINAL FISTULA CLOSURE W/ [facility]  1957    OB History    No data available       Home  Medications    Prior to Admission medications   Medication Sig Start Date End Date Taking? Authorizing Provider  acetaminophen (TYLENOL) 500 MG tablet Take 1 tablet (500 mg total) by mouth every 6 (six) hours as needed for moderate pain. 02/11/15  Yes Tori Milks, MD  cholecalciferol (VITAMIN D) 400 units TABS tablet Take 800 Units by mouth daily.   Yes [provider]  clotrimazole-betamethasone (LOTRISONE) cream Apply 1 application topically 2 (two) times daily.   Yes [provider]  Cranberry 500 MG CAPS Take 500 mg by mouth 2 (two) times daily.   Yes [provider]  dorzolamide (TRUSOPT) 2 % ophthalmic solution Place 1 drop into both eyes 3 (three) times daily.    Yes [provider]  folic acid (FOLVITE) 1 MG tablet Take 1 mg by mouth daily.   Yes [provider]  guaifenesin (ROBITUSSIN) 100 MG/5ML syrup Take 200 mg by mouth 3 (three) times daily as needed for cough or congestion.    Yes [provider]  HUMALOG KWIKPEN 100 UNIT/ML KiwkPen Inject 3 Units into the skin 3 (three) times daily before meals. 8a, 12p, 5:30p 03/04/17  Yes [provider]  LANTUS 100 UNIT/ML injection Inject 8 Units into the skin at bedtime. Has been taking 8 units into skin before breakfast 06/27/16  Yes [provider]  latanoprost (XALATAN) 0.005 % ophthalmic solution Place 1 drop into both eyes at bedtime.    Yes [provider]  loperamide (IMODIUM) 2 MG capsule Take 2 mg by mouth daily as needed for diarrhea or loose stools.    Yes [provider]  mirtazapine (REMERON) 15 MG tablet Take 15 mg by mouth at bedtime. 05/20/17  Yes [provider]  polyethylene glycol powder (GLYCOLAX/MIRALAX) powder Take 17 g by mouth 2 (two) times daily.  06/27/16  Yes [provider]  Probiotic Product (ALIGN) 4 MG CAPS Take 4 mg by mouth daily.    Yes [provider]  ranitidine (ZANTAC) 150 MG capsule Take 150  mg by mouth 2 (two) times daily.  11/01/15  Yes [provider]  sodium chloride (OCEAN) 0.65 % SOLN nasal spray Place 2 sprays into both nostrils as needed for congestion. Reported on 02/14/2016   Yes [provider]  sorbitol 70 % solution Take 30 mLs by mouth daily as needed (constipation).    Yes [provider]  levofloxacin (LEVAQUIN) 250 MG tablet Take 250 mg by mouth daily. 05/21/17   [provider]  predniSONE (DELTASONE) 5 MG tablet Take 5 mg by mouth 2 (two) times daily with a meal.    [provider]    Family History Family History  Problem Relation Age of Onset  . Stroke Mother   . Cancer Father   . Hypertension Other     Social History Social History  Substance Use Topics  . Smoking status: Never Smoker  . Smokeless tobacco: Never Used  .  Alcohol use No     Allergies   Penicillins; Sulfamethoxazole-trimethoprim; Bactrim; and Trimethoprim   Review of Systems Review of Systems  Unable to perform ROS: Dementia  Gastrointestinal: Positive for abdominal pain.  Genitourinary: Positive for dysuria.     Physical Exam Updated Vital Signs BP (!) 165/54 (BP Location: Left Arm)   Pulse (!) 55   Temp 98.6 F (37 C) (Oral)   Resp 12   Ht 5' (1.524 m)   Wt 77.1 kg (170 lb)   SpO2 99%   BMI 33.20 kg/m   Physical Exam  Constitutional: She appears well-developed and well-nourished. No distress.  Nontoxic appearing.  HENT:  Head: Normocephalic.  Right Ear: External ear normal.  Left Ear: External ear normal.  Mouth/Throat: Oropharynx is clear and moist.  There is ecchymosis noted to her right forehead. No other visible or palpated signs of head injury or trauma.  Eyes: Pupils are equal, round, and reactive to light. Conjunctivae are normal. Right eye exhibits no discharge. Left eye exhibits no discharge.  Neck: Neck supple.  Cardiovascular: Normal rate, regular rhythm, normal heart sounds and intact distal pulses.   Exam reveals no gallop and no friction rub.   No murmur heard. Pulmonary/Chest: Effort normal and breath sounds normal. No respiratory distress. She has no wheezes. She has no rales.  Abdominal: Soft. Bowel sounds are normal. She exhibits no distension. There is tenderness. There is no guarding.  Abdomen is soft. Bowel sounds are present. Patient has superpubic abdominal tenderness to palpation.  Musculoskeletal: She exhibits no edema.  Lymphadenopathy:    She has no cervical adenopathy.  Neurological: She is alert. No sensory deficit. Coordination normal.  Alert and oriented to person only. Patient's speech is clear and coherent. She has some confusion about why she is in the emergency department today where she is exactly. She is no pronator drift. EOMs are intact. Vision is grossly intact. Patient is spontaneously moving all extremities in a coordinated fashion exhibiting good strength.   Skin: Skin is warm and dry. Capillary refill takes less than 2 seconds. No rash noted. She is not diaphoretic. No erythema. No pallor.  Psychiatric: She has a normal mood and affect. Her behavior is normal.  Nursing note and vitals reviewed.    ED Treatments / Results  Labs (all labs ordered are listed, but only abnormal results are displayed) Labs Reviewed  URINALYSIS, ROUTINE W REFLEX MICROSCOPIC - Abnormal; Notable for the following:       Result Value   Hgb urine dipstick SMALL (*)    Ketones, ur 5 (*)    Protein, ur >=300 (*)    Bacteria, UA RARE (*)    All other components within normal limits  CBC WITH DIFFERENTIAL/PLATELET - Abnormal; Notable for the following:    Hemoglobin 11.1 (*)    MCV 76.5 (*)    MCH 23.5 (*)    All other components within normal limits  COMPREHENSIVE METABOLIC PANEL - Abnormal; Notable for the following:    Glucose, Bld 118 (*)    BUN 22 (*)    Creatinine, Ser 1.87 (*)    Albumin 3.2 (*)    GFR calc non Af Amer 22 (*)    GFR calc Af Amer 26 (*)    All other  components within normal limits  COMPREHENSIVE METABOLIC PANEL - Abnormal; Notable for the following:    BUN 22 (*)    Creatinine, Ser 1.81 (*)    Albumin 3.1 (*)  GFR calc non Af Amer 23 (*)    GFR calc Af Amer 27 (*)    All other components within normal limits  CBC - Abnormal; Notable for the following:    MCV 76.1 (*)    MCH 24.1 (*)    Platelets 141 (*)    All other components within normal limits  MRSA PCR SCREENING  AMMONIA  TROPONIN I  GLUCOSE, CAPILLARY  I-STAT CG4 LACTIC ACID, ED  CBG MONITORING, ED    EKG  EKG Interpretation  Date/Time:  Saturday May 24 2017 17:25:02 EDT Ventricular Rate:  78 PR Interval:    QRS Duration: 94 QT Interval:  376 QTC Calculation: 429 R Axis:   4 Text Interpretation:  Sinus rhythm Baseline wander Artifact When compared with ECG of 08/22/2005 Artifact is now Present Rate faster Confirmed by Francine Graven [phone]) on 05/24/2017 6:52:08 PM       Radiology Ct Abdomen Pelvis Wo Contrast  Result Date: 05/24/2017 CLINICAL DATA:  Abdominal pain. EXAM: CT ABDOMEN AND PELVIS WITHOUT CONTRAST TECHNIQUE: Multidetector CT imaging of the abdomen and pelvis was performed following the standard protocol without IV contrast. COMPARISON:  CT abdomen and pelvis dated October 24, 2016. FINDINGS: Lower chest: Subsegmental atelectasis.  No acute abnormality. Hepatobiliary: No focal liver abnormality is seen. Status post cholecystectomy. No biliary dilatation. Pancreas: Atrophic. No ductal dilatation or surrounding inflammatory changes. Spleen: Normal in size without focal abnormality. Adrenals/Urinary Tract: The adrenal glands are unremarkable. Stable 1.5 cm cyst in the left upper pole. No renal calculi or hydronephrosis. The bladder is unremarkable. Stomach/Bowel: The stomach is within normal limits. No bowel obstruction. Left-sided diverticulosis with unchanged wall thickening of a relatively long segment of the sigmoid colon with mild surrounding  inflammatory changes. Large amount of stool within the remaining colon. Vascular/Lymphatic: Aortic atherosclerosis. No enlarged abdominal or pelvic lymph nodes. Reproductive: Status post hysterectomy. No adnexal masses. Other: No free fluid or pneumoperitoneum. Musculoskeletal: No acute or significant osseous findings. Degenerative changes of the thoracolumbar spine, including 8 mm anterolisthesis at L4-L5, are unchanged. IMPRESSION: 1. Unchanged wall thickening and inflammatory changes surrounding a relatively long segment of sigmoid colon. Findings could reflect recurrent diverticulitis, however given the similar appearance and location to the prior study, underlying colon cancer cannot be entirely excluded. Correlate with recent colonoscopy. 2. Large amount of stool within the remaining colon proximal to the area of sigmoid wall thickening. Correlate for constipation. 3.  Aortic atherosclerosis (ICD10-I70.0). Electronically Signed   By: Titus Dubin M.D.   On: 05/24/2017 19:36   Dg Chest 2 View  Result Date: 05/24/2017 CLINICAL DATA:  Recent fall.  Weakness. EXAM: CHEST  2 VIEW COMPARISON:  09/29/2016 FINDINGS: Cardiomediastinal silhouette is normal. Mediastinal contours appear intact. Calcific atherosclerotic disease and tortuosity of the aorta. Stable eventration of the left hemidiaphragm. Streaky peribronchial opacities in the left lower lobe. Osseous structures are without acute abnormality. Soft tissues are grossly normal. IMPRESSION: Streaky peribronchial opacities in the left lower lobe may represent atelectasis versus airspace consolidation. No displaced fractures are seen. Electronically Signed   By: Fidela Salisbury M.D.   On: 05/24/2017 19:38   Ct Head Wo Contrast  Result Date: 05/24/2017 CLINICAL DATA:  "Pt BIB [facility] from [facility]. EMS reports that pt was recently dx'd with a UTI and has been on antibiotics for 3 of her 7 day prescription. She requested transfer today because  she/family feels like she is not improving. Potential fall. EXAM: CT HEAD WITHOUT CONTRAST CT CERVICAL SPINE  WITHOUT CONTRAST TECHNIQUE: Multidetector CT imaging of the head and cervical spine was performed following the standard protocol without intravenous contrast. Multiplanar CT image reconstructions of the cervical spine were also generated. COMPARISON:  Head CT 09/29/2016 FINDINGS: CT HEAD FINDINGS Brain: No intracranial hemorrhage. No parenchymal contusion. No midline shift or mass effect. Basilar cisterns are patent. No skull base fracture. No fluid in the paranasal sinuses or mastoid air cells. Orbits are normal. There are periventricular and subcortical white matter hypodensities. Generalized cortical atrophy. Vascular: No hyperdense vessel or unexpected calcification. Skull: Normal. Negative for fracture or focal lesion. Sinuses/Orbits: Paranasal sinuses and mastoid air cells are clear. Orbits are clear. Other: None. CT CERVICAL SPINE FINDINGS Alignment: Normal alignment of the cervical vertebral bodies. Skull base and vertebrae: Normal craniocervical junction. No loss of bowel vertebral body height or disc height. Normal facet articulation. No evidence of fracture. Soft tissues and spinal canal: No prevertebral soft tissue swelling. No perispinal or epidural hematoma. Disc levels:  Multilevel disc osteophytic disease. Upper chest: Clear Other: None IMPRESSION: 1. No intracranial trauma. 2. Atrophy and white matter microvascular disease. 3. No cervical spine fracture. Electronically Signed   By: Suzy Bouchard M.D.   On: 05/24/2017 17:16   Ct Cervical Spine Wo Contrast  Result Date: 05/24/2017 CLINICAL DATA:  "Pt BIB [facility] from [facility] EMS reports that pt was recently dx'd with a UTI and has been on antibiotics for 3 of her 7 day prescription. She requested transfer today because she/family feels like she is not improving. Potential fall. EXAM: CT HEAD WITHOUT CONTRAST CT CERVICAL SPINE  WITHOUT CONTRAST TECHNIQUE: Multidetector CT imaging of the head and cervical spine was performed following the standard protocol without intravenous contrast. Multiplanar CT image reconstructions of the cervical spine were also generated. COMPARISON:  Head CT 09/29/2016 FINDINGS: CT HEAD FINDINGS Brain: No intracranial hemorrhage. No parenchymal contusion. No midline shift or mass effect. Basilar cisterns are patent. No skull base fracture. No fluid in the paranasal sinuses or mastoid air cells. Orbits are normal. There are periventricular and subcortical white matter hypodensities. Generalized cortical atrophy. Vascular: No hyperdense vessel or unexpected calcification. Skull: Normal. Negative for fracture or focal lesion. Sinuses/Orbits: Paranasal sinuses and mastoid air cells are clear. Orbits are clear. Other: None. CT CERVICAL SPINE FINDINGS Alignment: Normal alignment of the cervical vertebral bodies. Skull base and vertebrae: Normal craniocervical junction. No loss of bowel vertebral body height or disc height. Normal facet articulation. No evidence of fracture. Soft tissues and spinal canal: No prevertebral soft tissue swelling. No perispinal or epidural hematoma. Disc levels:  Multilevel disc osteophytic disease. Upper chest: Clear Other: None IMPRESSION: 1. No intracranial trauma. 2. Atrophy and white matter microvascular disease. 3. No cervical spine fracture. Electronically Signed   By: Suzy Bouchard M.D.   On: 05/24/2017 17:16    Procedures Procedures (including critical care time)  Medications Ordered in ED Medications  heparin injection 5,000 Units (5,000 Units Subcutaneous Given 05/25/17 0020)  dextrose 5 %-0.45 % sodium chloride infusion ( Intravenous New Bag/Given 05/24/17 2353)  metroNIDAZOLE (FLAGYL) IVPB 500 mg (0 mg Intravenous Stopped 05/25/17 0052)  acetaminophen (TYLENOL) tablet 500 mg (not administered)  folic acid (FOLVITE) tablet 1 mg (not administered)  insulin glargine  (LANTUS) injection 5 Units (5 Units Subcutaneous Given 05/25/17 0010)  latanoprost (XALATAN) 0.005 % ophthalmic solution 1 drop (1 drop Both Eyes Given 05/24/17 2215)  mirtazapine (REMERON) tablet 15 mg (15 mg Oral Not Given 05/25/17 0017)  famotidine (PEPCID) tablet 20  mg (20 mg Oral Not Given 05/25/17 0017)  insulin aspart (novoLOG) injection 0-9 Units (not administered)  chlorhexidine (PERIDEX) 0.12 % solution 15 mL (not administered)  MEDLINE mouth rinse (not administered)  ciprofloxacin (CIPRO) IVPB 400 mg (0 mg Intravenous Stopped 05/25/17 0117)     Initial Impression / Assessment and Plan / ED Course  I have reviewed the triage vital signs and the nursing notes.  Pertinent labs & imaging results that were available during my care of the patient were reviewed by me and considered in my medical decision making (see chart for details).     This is a 83 y.o. Female who presents to the ED from her nursing facility, [facility], complaining of suprapubic abdominal pain. She has a history of dementia according to nursing staff. I spoke with nursing facility who reports she was started on Levaquin 4 days ago for urinary tract infection. They report over the past several days she's become more confused. Typically she is demented, however she is able to do the activities of her daily living. Reports she's not been able to feed herself and is been confused about where to go to the bathroom. She's been complaining of suprapubic abdominal pain. She's been on the Levaquin for about 4 days now. Nursing facility also reports that yesterday morning they noticed a bruise to the right side of her forehead. No known fall. They did find her sitting in her closet that morning. She is not on anticoagulants. She reports she is just not acting herself recently. Patient only complains of suprapubic abdominal pain. She was not evaluated after her fall. Later, family is at bedside and also reiterates the findings as  described by nursing facility. On exam the patient is afebrile and nontoxic appearing. She does have an area of ecchymosis ter right forehead. No other visible or palpated signs of head injury or trauma. She has some mild suprapubic abdominal tenderness to palpation. Urinalysis is nitrite and leukocyte negative with rare bacteria. This may be due to the fact that she is being treated with Levaquin currently. Lactic acid is normal. Troponin is normal. Ammonia level is normal. CMP shows kidney function at her baseline with a creatinine of 1.87. CBC shows no leukocytosis. CT head and cervical spine show no acute findings. Chest x-ray shows streaky peribronchial opacities in the left lower lobe which may represent atelectasis versus airspace consolidation. CT abdomen and pelvis shows unchanged wall thickening and inflammatory changes surrounding a relatively long segment of sigmoid colon. Findings could reflect recurrent diverticulitis or could be underlying colon cancer. There is also a large amount of stool remaining within the colon. I had a discussion earlier with family that the patient has a DNR and they would not like any surgery, but would want things treated by antibiotics if warranted.  After a discussion with my attending will provide a Cipro and Flagyl to cover for possible diverticulitis as well as this possible urinary tract infection and possible pneumonia. Will admit as the patient has been confused for observation  I consulted with hospitalist service who accepted the patient for admission.  This patient was discussed with Dr. Thurnell Garbe agrees with assessment and plan.  Final Clinical Impressions(s) / ED Diagnoses   Final diagnoses:  Diverticulitis  Constipation, unspecified constipation type  Community acquired pneumonia of left lower lobe of lung [facility])    New Prescriptions Current Discharge Medication List       Waynetta Pean, PA-C 05/25/17 0225    Francine Graven,  DO 05/25/17 2254

## 2017-05-24 NOTE — ED Notes (Signed)
Delay on EKG. Pt not in room.
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
## 2017-05-24 NOTE — H&P (Signed)
History and Physical  Allison Rodgers NOB:[id number] DOB: Oct 15, 1923 DOA: 05/24/2017  PCP:  Sande Brothers, MD   Chief Complaint:  AMS  History of Present Illness:  Pt is a 83 yo female NH resident who was brought with cc of AMS over the last few weeks along with worsening abdominal pain and decreased communication with family/staff. The patient had her most decline over the last 24 hours as she keeps sleeping and barely responding to any commands which is unusual for her despite having dementia at baseline. Otherwise no N/V/D/cough/chest pain/fever/chills per family who provided the history. She has been treated previously for diverticulitis and she is being treated now for UTI with levoquin.   Review of Systems:  Unable to get due to clinical status  Past Medical and Surgical History:   Past Medical History:  Diagnosis Date  . Arthritis   . Bacterial meningitis   . 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 Chronic abdominal pain   . Dementia   . Diverticulosis   . DJD (degenerative joint disease)   . DM (diabetes mellitus) ([place])   . Dyslipidemia   . Edema   . Esophageal stricture   . Gastritis, chronic   . GERD (gastroesophageal reflux disease)   . Hiatal hernia   . Hx of adenomatous colonic polyps   . Hypertension   . Hypertensive cardiovascular disease   . Microcytic anemia   . Obesity   . Renal disorder    Past Surgical History:  Procedure Laterality Date  . APPENDECTOMY    . BACK SURGERY    . BILATERAL OOPHORECTOMY  1957  . CHOLECYSTECTOMY  1999  . COLONOSCOPY WITH PROPOFOL N/A 10/31/2016   Procedure: COLONOSCOPY WITH PROPOFOL;  Surgeon: Milus Banister, MD;  Location: [facility];  Service: Endoscopy;  Laterality: N/A;  . HERNIA REPAIR  04/4764   umbilical and ventral hernia repair by Dr. Margot Chimes  . ROTATOR CUFF REPAIR  7/05   right  . VESICOVAGINAL FISTULA CLOSURE W/ TAH  1957    Social History:   reports that she  has never smoked. She has never used smokeless tobacco. She reports that she does not drink alcohol or use drugs.    Allergies  Allergen Reactions  . Penicillins Other (See Comments)    REACTION: causes hallucinations  . Sulfamethoxazole-Trimethoprim Swelling    REACTION: rash  . Bactrim     Unknown per mar  . Trimethoprim     Unknown per mar     Family History  Problem Relation Age of Onset  . Stroke Mother   . Cancer Father   . Hypertension Other       Prior to Admission medications   Medication Sig Start Date End Date Taking? Authorizing Provider  acetaminophen (TYLENOL) 500 MG tablet Take 1 tablet (500 mg total) by mouth every 6 (six) hours as needed for moderate pain. 02/11/15  Yes Tori Milks, MD  cholecalciferol (VITAMIN D) 400 units TABS tablet Take 800 Units by mouth daily.   Yes [provider]  clotrimazole-betamethasone (LOTRISONE) cream Apply 1 application topically 2 (two) times daily.   Yes [provider]  Cranberry 500 MG CAPS Take 500 mg by mouth 2 (two) times daily.   Yes [provider]  dorzolamide (TRUSOPT) 2 % ophthalmic solution Place 1 drop into both eyes 3 (three) times daily.    Yes [provider]  folic acid (FOLVITE) 1 MG tablet Take 1 mg  by mouth daily.   Yes [provider]  guaifenesin (ROBITUSSIN) 100 MG/5ML syrup Take 200 mg by mouth 3 (three) times daily as needed for cough or congestion.    Yes [provider]  HUMALOG KWIKPEN 100 UNIT/ML KiwkPen Inject 3 Units into the skin 3 (three) times daily before meals. 8a, 12p, 5:30p 03/04/17  Yes [provider]  LANTUS 100 UNIT/ML injection Inject 8 Units into the skin at bedtime. Has been taking 8 units into skin before breakfast 06/27/16  Yes [provider]  latanoprost (XALATAN) 0.005 % ophthalmic solution Place 1 drop into both eyes at bedtime.    Yes [provider]  loperamide (IMODIUM) 2 MG capsule Take 2 mg by  mouth daily as needed for diarrhea or loose stools.    Yes [provider]  mirtazapine (REMERON) 15 MG tablet Take 15 mg by mouth at bedtime. 05/20/17  Yes [provider]  polyethylene glycol powder (GLYCOLAX/MIRALAX) powder Take 17 g by mouth 2 (two) times daily.  06/27/16  Yes [provider]  Probiotic Product (ALIGN) 4 MG CAPS Take 4 mg by mouth daily.    Yes [provider]  ranitidine (ZANTAC) 150 MG capsule Take 150 mg by mouth 2 (two) times daily.  11/01/15  Yes [provider]  sodium chloride (OCEAN) 0.65 % SOLN nasal spray Place 2 sprays into both nostrils as needed for congestion. Reported on 02/14/2016   Yes [provider]  sorbitol 70 % solution Take 30 mLs by mouth daily as needed (constipation).    Yes [provider]  levofloxacin (LEVAQUIN) 250 MG tablet Take 250 mg by mouth daily. 05/21/17   [provider]  predniSONE (DELTASONE) 5 MG tablet Take 5 mg by mouth 2 (two) times daily with a meal.    [provider]    Physical Exam: BP (!) 170/80   Pulse 78   Temp 98.6 F (37 C) (Oral)   Resp 16   SpO2 99%   GENERAL :   Sleepy, answering Ok to every question, not responding to commands but responding to pain stimuli HEAD:           normocephalic. NECK:          supple CARDIAC:    Normal S1 and S2. No gallop. No murmurs.  Vascular:     no peripheral edema.  LUNGS:       Clear to auscultation  ABDOMEN: Positive bowel sounds. Soft, nondistended, nontender.     EXT           : No significant deformity           Labs on Admission:  Reviewed.   Radiological Exams on Admission: Ct Abdomen Pelvis Wo Contrast  Result Date: 05/24/2017 CLINICAL DATA:  Abdominal pain. EXAM: CT ABDOMEN AND PELVIS WITHOUT CONTRAST TECHNIQUE: Multidetector CT imaging of the abdomen and pelvis was performed following the standard protocol without IV contrast. COMPARISON:  CT abdomen and pelvis dated October 24, 2016.  FINDINGS: Lower chest: Subsegmental atelectasis.  No acute abnormality. Hepatobiliary: No focal liver abnormality is seen. Status post cholecystectomy. No biliary dilatation. Pancreas: Atrophic. No ductal dilatation or surrounding inflammatory changes. Spleen: Normal in size without focal abnormality. Adrenals/Urinary Tract: The adrenal glands are unremarkable. Stable 1.5 cm cyst in the left upper pole. No renal calculi or hydronephrosis. The bladder is unremarkable. Stomach/Bowel: The stomach is within normal limits. No bowel obstruction. Left-sided diverticulosis with unchanged wall thickening of a relatively long  segment of the sigmoid colon with mild surrounding inflammatory changes. Large amount of stool within the remaining colon. Vascular/Lymphatic: Aortic atherosclerosis. No enlarged abdominal or pelvic lymph nodes. Reproductive: Status post hysterectomy. No adnexal masses. Other: No free fluid or pneumoperitoneum. Musculoskeletal: No acute or significant osseous findings. Degenerative changes of the thoracolumbar spine, including 8 mm anterolisthesis at L4-L5, are unchanged. IMPRESSION: 1. Unchanged wall thickening and inflammatory changes surrounding a relatively long segment of sigmoid colon. Findings could reflect recurrent diverticulitis, however given the similar appearance and location to the prior study, underlying colon cancer cannot be entirely excluded. Correlate with recent colonoscopy. 2. Large amount of stool within the remaining colon proximal to the area of sigmoid wall thickening. Correlate for constipation. 3.  Aortic atherosclerosis (ICD10-I70.0). Electronically Signed   By: Titus Dubin M.D.   On: 05/24/2017 19:36   Dg Chest 2 View  Result Date: 05/24/2017 CLINICAL DATA:  Recent fall.  Weakness. EXAM: CHEST  2 VIEW COMPARISON:  09/29/2016 FINDINGS: Cardiomediastinal silhouette is normal. Mediastinal contours appear intact. Calcific atherosclerotic disease and tortuosity of the  aorta. Stable eventration of the left hemidiaphragm. Streaky peribronchial opacities in the left lower lobe. Osseous structures are without acute abnormality. Soft tissues are grossly normal. IMPRESSION: Streaky peribronchial opacities in the left lower lobe may represent atelectasis versus airspace consolidation. No displaced fractures are seen. Electronically Signed   By: Fidela Salisbury M.D.   On: 05/24/2017 19:38   Ct Head Wo Contrast  Result Date: 05/24/2017 CLINICAL DATA:  "Pt BIB [facility] from [facility]. EMS reports that pt was recently dx'd with a UTI and has been on antibiotics for 3 of her 7 day prescription. She requested transfer today because she/family feels like she is not improving. Potential fall. EXAM: CT HEAD WITHOUT CONTRAST CT CERVICAL SPINE WITHOUT CONTRAST TECHNIQUE: Multidetector CT imaging of the head and cervical spine was performed following the standard protocol without intravenous contrast. Multiplanar CT image reconstructions of the cervical spine were also generated. COMPARISON:  Head CT 09/29/2016 FINDINGS: CT HEAD FINDINGS Brain: No intracranial hemorrhage. No parenchymal contusion. No midline shift or mass effect. Basilar cisterns are patent. No skull base fracture. No fluid in the paranasal sinuses or mastoid air cells. Orbits are normal. There are periventricular and subcortical white matter hypodensities. Generalized cortical atrophy. Vascular: No hyperdense vessel or unexpected calcification. Skull: Normal. Negative for fracture or focal lesion. Sinuses/Orbits: Paranasal sinuses and mastoid air cells are clear. Orbits are clear. Other: None. CT CERVICAL SPINE FINDINGS Alignment: Normal alignment of the cervical vertebral bodies. Skull base and vertebrae: Normal craniocervical junction. No loss of bowel vertebral body height or disc height. Normal facet articulation. No evidence of fracture. Soft tissues and spinal canal: No prevertebral soft tissue swelling. No  perispinal or epidural hematoma. Disc levels:  Multilevel disc osteophytic disease. Upper chest: Clear Other: None IMPRESSION: 1. No intracranial trauma. 2. Atrophy and white matter microvascular disease. 3. No cervical spine fracture. Electronically Signed   By: Suzy Bouchard M.D.   On: 05/24/2017 17:16   Ct Cervical Spine Wo Contrast  Result Date: 05/24/2017 CLINICAL DATA:  "Pt BIB [facility] from [facility]. EMS reports that pt was recently dx'd with a UTI and has been on antibiotics for 3 of her 7 day prescription. She requested transfer today because she/family feels like she is not improving. Potential fall. EXAM: CT HEAD WITHOUT CONTRAST CT CERVICAL SPINE WITHOUT CONTRAST TECHNIQUE: Multidetector CT imaging of the head and cervical spine was performed following the standard  protocol without intravenous contrast. Multiplanar CT image reconstructions of the cervical spine were also generated. COMPARISON:  Head CT 09/29/2016 FINDINGS: CT HEAD FINDINGS Brain: No intracranial hemorrhage. No parenchymal contusion. No midline shift or mass effect. Basilar cisterns are patent. No skull base fracture. No fluid in the paranasal sinuses or mastoid air cells. Orbits are normal. There are periventricular and subcortical white matter hypodensities. Generalized cortical atrophy. Vascular: No hyperdense vessel or unexpected calcification. Skull: Normal. Negative for fracture or focal lesion. Sinuses/Orbits: Paranasal sinuses and mastoid air cells are clear. Orbits are clear. Other: None. CT CERVICAL SPINE FINDINGS Alignment: Normal alignment of the cervical vertebral bodies. Skull base and vertebrae: Normal craniocervical junction. No loss of bowel vertebral body height or disc height. Normal facet articulation. No evidence of fracture. Soft tissues and spinal canal: No prevertebral soft tissue swelling. No perispinal or epidural hematoma. Disc levels:  Multilevel disc osteophytic disease. Upper chest: Clear Other:  None IMPRESSION: 1. No intracranial trauma. 2. Atrophy and white matter microvascular disease. 3. No cervical spine fracture. Electronically Signed   By: Suzy Bouchard M.D.   On: 05/24/2017 17:16      Assessment/Plan  AMS :  Baseline : dementia , unclear how much communicative she was but this is an acute change Possibly due to infection  Will continue cipro/flagyl for diverticulitis, cipro should cover UTI, UA is clear today, f/u ucx from NH.  Keep NPO for now Can't r/o CVA  May need GI consult as AMS resolving  Despite CXR findings, no sx for pneumonia   DMII: continue lantus 5 u QHS and SS insulin for now.    Input & Output: NA Lines & Tubes: PIV DVT prophylaxis: [place] hep GI prophylaxis: pepcid  Consultants: NA Code Status: full Family Communication: at bedside  Disposition Plan: TBD    Gennaro Africa M.D [facility]

## 2017-05-24 NOTE — ED Notes (Signed)
2 failed attempts to collect labs. RN notified 

## 2017-05-24 NOTE — ED Notes (Signed)
Patient's I stat Lactic Acid : 1.09

## 2017-05-25 DIAGNOSIS — G9340 Encephalopathy, unspecified: Secondary | ICD-10-CM

## 2017-05-25 DIAGNOSIS — E1121 Type 2 diabetes mellitus with diabetic nephropathy: Secondary | ICD-10-CM

## 2017-05-25 DIAGNOSIS — K5900 Constipation, unspecified: Secondary | ICD-10-CM

## 2017-05-25 LAB — TROPONIN I

## 2017-05-25 LAB — COMPREHENSIVE METABOLIC PANEL
ALBUMIN: 3.1 g/dL — AB (ref 3.5–5.0)
ALT: 14 U/L (ref 14–54)
ANION GAP: 6 (ref 5–15)
AST: 23 U/L (ref 15–41)
Alkaline Phosphatase: 84 U/L (ref 38–126)
BUN: 22 mg/dL — ABNORMAL HIGH (ref 6–20)
CHLORIDE: 111 mmol/L (ref 101–111)
CO2: 26 mmol/L (ref 22–32)
Calcium: 9.1 mg/dL (ref 8.9–10.3)
Creatinine, Ser: 1.81 mg/dL — ABNORMAL HIGH (ref 0.44–1.00)
GFR calc Af Amer: 27 mL/min — ABNORMAL LOW (ref >60)
GFR calc non Af Amer: 23 mL/min — ABNORMAL LOW (ref >60)
GLUCOSE: 99 mg/dL (ref 65–99)
POTASSIUM: 3.7 mmol/L (ref 3.5–5.1)
Sodium: 143 mmol/L (ref 135–145)
TOTAL PROTEIN: 6.8 g/dL (ref 6.5–8.1)
Total Bilirubin: 0.6 mg/dL (ref 0.3–1.2)

## 2017-05-25 LAB — CBC
HEMATOCRIT: 37.8 % (ref 36.0–46.0)
HEMOGLOBIN: 12 g/dL (ref 12.0–15.0)
MCH: 24.1 pg — ABNORMAL LOW (ref 26.0–34.0)
MCHC: 31.7 g/dL (ref 30.0–36.0)
MCV: 76.1 fL — AB (ref 78.0–100.0)
Platelets: 141 10*3/uL — ABNORMAL LOW (ref 150–400)
RBC: 4.97 MIL/uL (ref 3.87–5.11)
RDW: 14.7 % (ref 11.5–15.5)
WBC: 5.1 10*3/uL (ref 4.0–10.5)

## 2017-05-25 LAB — MRSA PCR SCREENING: MRSA by PCR: NEGATIVE

## 2017-05-25 LAB — GLUCOSE, CAPILLARY
GLUCOSE-CAPILLARY: 114 mg/dL — AB (ref 65–99)
Glucose-Capillary: 105 mg/dL — ABNORMAL HIGH (ref 65–99)
Glucose-Capillary: 114 mg/dL — ABNORMAL HIGH (ref 65–99)

## 2017-05-25 MED ORDER — HYDRALAZINE HCL 20 MG/ML IJ SOLN
10.0000 mg | INTRAMUSCULAR | Status: DC | PRN
  Administered 2017-05-25 – 2017-05-29 (×4): 10 mg via INTRAVENOUS
  Filled 2017-05-25 (×4): qty 1

## 2017-05-25 MED ORDER — POLYETHYLENE GLYCOL 3350 17 G PO PACK
17.0000 g | PACK | Freq: Two times a day (BID) | ORAL | Status: DC
  Administered 2017-05-25 – 2017-05-26 (×4): 17 g via ORAL
  Filled 2017-05-25 (×4): qty 1

## 2017-05-25 MED ORDER — DOCUSATE SODIUM 100 MG PO CAPS
200.0000 mg | ORAL_CAPSULE | Freq: Two times a day (BID) | ORAL | Status: DC
  Administered 2017-05-25 – 2017-05-26 (×4): 200 mg via ORAL
  Filled 2017-05-25 (×4): qty 2

## 2017-05-25 MED ORDER — CIPROFLOXACIN IN D5W 400 MG/200ML IV SOLN: 400.0000 mg | INTRAVENOUS | Status: DC

## 2017-05-25 MED ORDER — FAMOTIDINE 20 MG PO TABS
20.0000 mg | ORAL_TABLET | Freq: Every day | ORAL | Status: DC
  Administered 2017-05-26 – 2017-05-29 (×4): 20 mg via ORAL
  Filled 2017-05-25 (×4): qty 1

## 2017-05-25 MED ORDER — DEXTROSE-NACL 5-0.45 % IV SOLN
INTRAVENOUS | Status: DC
  Administered 2017-05-25: 18:00:00 via INTRAVENOUS

## 2017-05-25 MED ORDER — BISACODYL 10 MG RE SUPP
10.0000 mg | Freq: Two times a day (BID) | RECTAL | Status: DC
  Administered 2017-05-25 – 2017-05-26 (×3): 10 mg via RECTAL
  Filled 2017-05-25 (×4): qty 1

## 2017-05-25 MED ORDER — DEXTROSE 5 % IV SOLN
1.0000 g | INTRAVENOUS | Status: AC
  Administered 2017-05-25 – 2017-05-27 (×3): 1 g via INTRAVENOUS
  Filled 2017-05-25 (×3): qty 10

## 2017-05-25 MED ORDER — CHLORHEXIDINE GLUCONATE 0.12 % MT SOLN
15.0000 mL | Freq: Two times a day (BID) | OROMUCOSAL | Status: DC
  Administered 2017-05-25 – 2017-05-29 (×6): 15 mL via OROMUCOSAL
  Filled 2017-05-25 (×5): qty 15

## 2017-05-25 MED ORDER — ORAL CARE MOUTH RINSE
15.0000 mL | Freq: Two times a day (BID) | OROMUCOSAL | Status: DC
  Administered 2017-05-26 – 2017-05-29 (×2): 15 mL via OROMUCOSAL

## 2017-05-25 MED ORDER — AMLODIPINE BESYLATE 10 MG PO TABS
10.0000 mg | ORAL_TABLET | Freq: Every day | ORAL | Status: DC
  Administered 2017-05-26 – 2017-05-29 (×4): 10 mg via ORAL
  Filled 2017-05-25 (×4): qty 1

## 2017-05-25 NOTE — Progress Notes (Signed)
Pharmacy Antibiotic Note  Allison Rodgers is a 83 y.o. female admitted on 05/24/2017 with Intra-abdominal infection.  Pharmacy has been consulted for ciprofloxacin dosing.  Plan: Ciprofloxacin 400mg  iv q24hr  Height: 5' (152.4 cm) Weight: 170 lb (77.1 kg) IBW/kg (Calculated) : 45.5  Temp (24hrs), Avg:98.6 F (37 C), Min:98.6 F (37 C), Max:98.6 F (37 C)   Recent Labs Lab 05/24/17 1524 05/24/17 1630 05/24/17 1736 05/25/17 0047  WBC 7.7  --   --  5.1  CREATININE  --  1.87*  --  1.81*  LATICACIDVEN  --   --  1.09  --     Estimated Creatinine Clearance: 18.6 mL/min (A) (by C-G formula based on SCr of 1.81 mg/dL (H)).    Allergies  Allergen Reactions  . Penicillins Other (See Comments)    REACTION: causes hallucinations  . Sulfamethoxazole-Trimethoprim Swelling    REACTION: rash  . Bactrim     Unknown per mar  . Trimethoprim     Unknown per mar     Antimicrobials this admission: Ciprofloxacin 05/24/2017 >> Flagyl 05/24/2017 >>  Dose adjustments this admission: -  Microbiology results: pending  Thank you for allowing pharmacy to be a part of this patient's care.  Nani Skillern Crowford 05/25/2017 4:40 AM

## 2017-05-25 NOTE — Progress Notes (Signed)
@IPLOG @        PROGRESS NOTE                                                                                                                                                                                                             Patient Demographics:    Allison Rodgers, is a 83 y.o. female, DOB - 1924/04/21, GYI:[id number]  Admit date - 05/24/2017   Admitting Physician Gennaro Africa, MD  Outpatient Primary MD for the patient is Sande Brothers, MD  LOS - 1  Chief Complaint  Patient presents with  . Dysuria       Brief Narrative  Pt is a 83 yo female NH resident who was brought with cc of AMS over the last few weeks along with worsening abdominal pain and decreased communication with family/staff. The patient had her most decline over the last 24 hours as she keeps sleeping and barely responding to any commands which is unusual for her despite having dementia at baseline. Otherwise no N/V/D/cough/chest pain/fever/chills per family who provided the history. She has been treated previously for diverticulitis and she is being treated now for UTI with levoquin.     Subjective:    Allison Rodgers is pleasantly confused but appears to be in no distress, she says she has No headache, No chest pain, No abdominal pain - No Nausea, No new weakness tingling or numbness, No Cough - SOB.     Assessment  & Plan :     1. Delirium in a patient with advanced dementia likely due to mild acute on chronic diverticulitis caused by obstipation and extremely large stool burden. Patient has known history of recurrent diverticulitis, known history of colovesical  Fistula - discussed her case with surgeon Dr. Philip Aspen on 35/00/9381, she has seen the patient few months ago for the same problem and at that time it was decided that patient will not be a good surgical candidate and that her problems will be managed medically.   At this time I have initiated bowel regimen as I think large stool burden is majority  of her problem, full liquid diet, antibiotics for some likelihood of acute on chronic diverticulitis, minimize narcotics and benzodiazepines to minimize delirium, once clinically better discharged back to SNF which likely might happen in the next 1-2 days.  2. DM type II. Hold oral medications for now place on sliding scale.  CBG (last 3)   Recent Labs  05/24/17 2357  GLUCAP 97     3. Recurrent UTIs. Due to  her known history of colovesical fistula. She will be chronically colonized. Will not treat unless there are clear signs and symptoms of UTI.  4. Underlying dementia. Home medications continued. Remains at risk for delirium. Minimize narcotics and benzodiazepines. Use Haldol when necessary if needed for agitation.  5.CKD 5. Creatinine at baseline appears to be close to 1.8. She is at baseline, continue gentle hydration and monitor.    Diet : Diet full liquid Room service appropriate? Yes; Fluid consistency: Thin   Family Communication  :  None present  Code Status :  Full  Disposition Plan  :  SNF 1-2 days  Consults  :  None  Procedures  :    CT head and C-spine - nonacute.  CT abdomen and pelvis. 1. Unchanged wall thickening and inflammatory changes surrounding a relatively long segment of sigmoid colon. Findings could reflect recurrent diverticulitis, however given the similar appearance and location to the prior study, underlying colon cancer cannot be entirely excluded. Correlate with recent colonoscopy. 2. Large amount of stool within the remaining colon proximal to the area of sigmoid wall thickening. Correlate for constipation. 3.  Aortic atherosclerosis    DVT Prophylaxis  :  Heparin   Lab Results  Component Value Date   PLT 141 (L) 05/25/2017    Inpatient Medications  Scheduled Meds: . bisacodyl  10 mg Rectal BID  . chlorhexidine  15 mL Mouth Rinse BID  . docusate sodium  200 mg Oral BID  . famotidine  20 mg Oral BID  . folic acid  1 mg Oral Daily  .  heparin  5,000 Units Subcutaneous Q12H  . insulin aspart  0-9 Units Subcutaneous TID WC  . insulin glargine  5 Units Subcutaneous QHS  . latanoprost  1 drop Both Eyes QHS  . mouth rinse  15 mL Mouth Rinse q12n4p  . mirtazapine  15 mg Oral QHS  . polyethylene glycol  17 g Oral BID   Continuous Infusions: . cefTRIAXone (ROCEPHIN)  IV Stopped (05/25/17 1010)  . dextrose 5 % and 0.45% NaCl 75 mL/hr at 05/24/17 2353  . metronidazole 500 mg (05/25/17 0641)   PRN Meds:.acetaminophen, hydrALAZINE  Antibiotics  :    Anti-infectives    Start     Dose/Rate Route Frequency Ordered Stop   05/25/17 2200  ciprofloxacin (CIPRO) IVPB 400 mg  Status:  Discontinued     400 mg 200 mL/hr over 60 Minutes Intravenous Every 24 hours 05/25/17 0439 05/25/17 0722   05/25/17 0800  cefTRIAXone (ROCEPHIN) 1 g in dextrose 5 % 50 mL IVPB     1 g 100 mL/hr over 30 Minutes Intravenous Every 24 hours 05/25/17 0722 05/28/17 0759   05/24/17 2245  ciprofloxacin (CIPRO) IVPB 400 mg     400 mg 200 mL/hr over 60 Minutes Intravenous  Once 05/24/17 2235 05/25/17 0117   05/24/17 2230  metroNIDAZOLE (FLAGYL) IVPB 500 mg     500 mg 100 mL/hr over 60 Minutes Intravenous Every 8 hours 05/24/17 2211     05/24/17 2145  ciprofloxacin (CIPRO) IVPB 200 mg  Status:  Discontinued     200 mg 100 mL/hr over 60 Minutes Intravenous  Once 05/24/17 2138 05/24/17 2235   05/24/17 2145  metroNIDAZOLE (FLAGYL) IVPB 500 mg  Status:  Discontinued     500 mg 100 mL/hr over 60 Minutes Intravenous  Once 05/24/17 2138 05/24/17 2211         Objective:   Vitals:   05/25/17 0002 05/25/17 0602 05/25/17  9702 05/25/17 0706  BP: (!) 165/54 (!) 185/70 (!) 184/80 (!) 160/78  Pulse: (!) 55 65    Resp: 12 14    Temp: 98.6 F (37 C) 98 F (36.7 C)    TempSrc: Oral Oral    SpO2: 99% 100%    Weight: 77.1 kg (170 lb)     Height: 5' (1.524 m)       Wt Readings from Last 3 Encounters:  05/25/17 77.1 kg (170 lb)  05/05/17 71.9 kg (158 lb 9.6 oz)   03/04/17 68.1 kg (150 lb 3.2 oz)     Intake/Output Summary (Last 24 hours) at 05/25/17 1146 Last data filed at 05/25/17 0641  Gross per 24 hour  Intake              910 ml  Output                0 ml  Net              910 ml     Physical Exam  Awake, pleasantly confused at baseline, No new F.N deficits, Normal affect [place].AT,PERRAL Supple Neck,No JVD, No cervical lymphadenopathy appriciated.  Symmetrical Chest wall movement, Good air movement bilaterally, CTAB RRR,No Gallops,Rubs or new Murmurs, No Parasternal Heave +ve B.Sounds, Abd Soft, No tenderness, No organomegaly appriciated, No rebound - guarding or rigidity. No Cyanosis, Clubbing or edema, No new Rash or bruise       Data Review:    CBC  Recent Labs Lab 05/24/17 1524 05/25/17 0047  WBC 7.7 5.1  HGB 11.1* 12.0  HCT 36.1 37.8  PLT 172 141*  MCV 76.5* 76.1*  MCH 23.5* 24.1*  MCHC 30.7 31.7  RDW 14.8 14.7  LYMPHSABS 1.6  --   MONOABS 0.7  --   EOSABS 0.2  --   BASOSABS 0.0  --     Chemistries   Recent Labs Lab 05/24/17 1630 05/25/17 0047  NA 142 143  K 4.7 3.7  CL 109 111  CO2 23 26  GLUCOSE 118* 99  BUN 22* 22*  CREATININE 1.87* 1.81*  CALCIUM 9.0 9.1  AST 38 23  ALT 15 14  ALKPHOS 86 84  BILITOT 0.7 0.6   ------------------------------------------------------------------------------------------------------------------ No results for input(s): CHOL, HDL, LDLCALC, TRIG, CHOLHDL, LDLDIRECT in the last 72 hours.  Lab Results  Component Value Date   HGBA1C 6.5 03/04/2017   ------------------------------------------------------------------------------------------------------------------ No results for input(s): TSH, T4TOTAL, T3FREE, THYROIDAB in the last 72 hours.  Invalid input(s): FREET3 ------------------------------------------------------------------------------------------------------------------ No results for input(s): VITAMINB12, FOLATE, FERRITIN, TIBC, IRON, RETICCTPCT in the  last 72 hours.  Coagulation profile No results for input(s): INR, PROTIME in the last 168 hours.  No results for input(s): DDIMER in the last 72 hours.  Cardiac Enzymes  Recent Labs Lab 05/25/17 0047  TROPONINI <0.03   ------------------------------------------------------------------------------------------------------------------ No results found for: BNP  Micro Results Recent Results (from the past 240 hour(s))  MRSA PCR Screening     Status: None   Collection Time: 05/25/17 12:25 AM  Result Value Ref Range Status   MRSA by PCR NEGATIVE NEGATIVE Final    Comment:        The GeneXpert MRSA Assay (FDA approved for NASAL specimens only), is one component of a comprehensive MRSA colonization surveillance program. It is not intended to diagnose MRSA infection nor to guide or monitor treatment for MRSA infections.     Radiology Reports Ct Abdomen Pelvis Wo Contrast  Result Date: 05/24/2017 CLINICAL DATA:  Abdominal pain. EXAM: CT ABDOMEN AND PELVIS WITHOUT CONTRAST TECHNIQUE: Multidetector CT imaging of the abdomen and pelvis was performed following the standard protocol without IV contrast. COMPARISON:  CT abdomen and pelvis dated October 24, 2016. FINDINGS: Lower chest: Subsegmental atelectasis.  No acute abnormality. Hepatobiliary: No focal liver abnormality is seen. Status post cholecystectomy. No biliary dilatation. Pancreas: Atrophic. No ductal dilatation or surrounding inflammatory changes. Spleen: Normal in size without focal abnormality. Adrenals/Urinary Tract: The adrenal glands are unremarkable. Stable 1.5 cm cyst in the left upper pole. No renal calculi or hydronephrosis. The bladder is unremarkable. Stomach/Bowel: The stomach is within normal limits. No bowel obstruction. Left-sided diverticulosis with unchanged wall thickening of a relatively long segment of the sigmoid colon with mild surrounding inflammatory changes. Large amount of stool within the remaining colon.  Vascular/Lymphatic: Aortic atherosclerosis. No enlarged abdominal or pelvic lymph nodes. Reproductive: Status post hysterectomy. No adnexal masses. Other: No free fluid or pneumoperitoneum. Musculoskeletal: No acute or significant osseous findings. Degenerative changes of the thoracolumbar spine, including 8 mm anterolisthesis at L4-L5, are unchanged. IMPRESSION: 1. Unchanged wall thickening and inflammatory changes surrounding a relatively long segment of sigmoid colon. Findings could reflect recurrent diverticulitis, however given the similar appearance and location to the prior study, underlying colon cancer cannot be entirely excluded. Correlate with recent colonoscopy. 2. Large amount of stool within the remaining colon proximal to the area of sigmoid wall thickening. Correlate for constipation. 3.  Aortic atherosclerosis (ICD10-I70.0). Electronically Signed   By: Titus Dubin M.D.   On: 05/24/2017 19:36   Dg Chest 2 View  Result Date: 05/24/2017 CLINICAL DATA:  Recent fall.  Weakness. EXAM: CHEST  2 VIEW COMPARISON:  09/29/2016 FINDINGS: Cardiomediastinal silhouette is normal. Mediastinal contours appear intact. Calcific atherosclerotic disease and tortuosity of the aorta. Stable eventration of the left hemidiaphragm. Streaky peribronchial opacities in the left lower lobe. Osseous structures are without acute abnormality. Soft tissues are grossly normal. IMPRESSION: Streaky peribronchial opacities in the left lower lobe may represent atelectasis versus airspace consolidation. No displaced fractures are seen. Electronically Signed   By: Fidela Salisbury M.D.   On: 05/24/2017 19:38   Ct Head Wo Contrast  Result Date: 05/24/2017 CLINICAL DATA:  "Pt BIB [facility] from [facility]. EMS reports that pt was recently dx'd with a UTI and has been on antibiotics for 3 of her 7 day prescription. She requested transfer today because she/family feels like she is not improving. Potential fall. EXAM: CT HEAD  WITHOUT CONTRAST CT CERVICAL SPINE WITHOUT CONTRAST TECHNIQUE: Multidetector CT imaging of the head and cervical spine was performed following the standard protocol without intravenous contrast. Multiplanar CT image reconstructions of the cervical spine were also generated. COMPARISON:  Head CT 09/29/2016 FINDINGS: CT HEAD FINDINGS Brain: No intracranial hemorrhage. No parenchymal contusion. No midline shift or mass effect. Basilar cisterns are patent. No skull base fracture. No fluid in the paranasal sinuses or mastoid air cells. Orbits are normal. There are periventricular and subcortical white matter hypodensities. Generalized cortical atrophy. Vascular: No hyperdense vessel or unexpected calcification. Skull: Normal. Negative for fracture or focal lesion. Sinuses/Orbits: Paranasal sinuses and mastoid air cells are clear. Orbits are clear. Other: None. CT CERVICAL SPINE FINDINGS Alignment: Normal alignment of the cervical vertebral bodies. Skull base and vertebrae: Normal craniocervical junction. No loss of bowel vertebral body height or disc height. Normal facet articulation. No evidence of fracture. Soft tissues and spinal canal: No prevertebral soft tissue swelling. No perispinal or epidural hematoma. Disc levels:  Multilevel  disc osteophytic disease. Upper chest: Clear Other: None IMPRESSION: 1. No intracranial trauma. 2. Atrophy and white matter microvascular disease. 3. No cervical spine fracture. Electronically Signed   By: Suzy Bouchard M.D.   On: 05/24/2017 17:16   Ct Cervical Spine Wo Contrast  Result Date: 05/24/2017 CLINICAL DATA:  "Pt BIB [facility] from [facility]. EMS reports that pt was recently dx'd with a UTI and has been on antibiotics for 3 of her 7 day prescription. She requested transfer today because she/family feels like she is not improving. Potential fall. EXAM: CT HEAD WITHOUT CONTRAST CT CERVICAL SPINE WITHOUT CONTRAST TECHNIQUE: Multidetector CT imaging of the head and cervical  spine was performed following the standard protocol without intravenous contrast. Multiplanar CT image reconstructions of the cervical spine were also generated. COMPARISON:  Head CT 09/29/2016 FINDINGS: CT HEAD FINDINGS Brain: No intracranial hemorrhage. No parenchymal contusion. No midline shift or mass effect. Basilar cisterns are patent. No skull base fracture. No fluid in the paranasal sinuses or mastoid air cells. Orbits are normal. There are periventricular and subcortical white matter hypodensities. Generalized cortical atrophy. Vascular: No hyperdense vessel or unexpected calcification. Skull: Normal. Negative for fracture or focal lesion. Sinuses/Orbits: Paranasal sinuses and mastoid air cells are clear. Orbits are clear. Other: None. CT CERVICAL SPINE FINDINGS Alignment: Normal alignment of the cervical vertebral bodies. Skull base and vertebrae: Normal craniocervical junction. No loss of bowel vertebral body height or disc height. Normal facet articulation. No evidence of fracture. Soft tissues and spinal canal: No prevertebral soft tissue swelling. No perispinal or epidural hematoma. Disc levels:  Multilevel disc osteophytic disease. Upper chest: Clear Other: None IMPRESSION: 1. No intracranial trauma. 2. Atrophy and white matter microvascular disease. 3. No cervical spine fracture. Electronically Signed   By: Suzy Bouchard M.D.   On: 05/24/2017 17:16    Time Spent in minutes  30   Lala Lund M.D on 05/25/2017 at 11:46 AM  Between 7am to 7pm - Pager - [phone] ( page via [email or web address], text pages only, please mention full 10 digit call back number). After 7pm go to [email or web address] - password [facility]

## 2017-05-26 ENCOUNTER — Inpatient Hospital Stay (HOSPITAL_COMMUNITY): Payer: Medicare Other

## 2017-05-26 LAB — CBC
HEMATOCRIT: 38.1 % (ref 36.0–46.0)
Hemoglobin: 11.8 g/dL — ABNORMAL LOW (ref 12.0–15.0)
MCH: 23.5 pg — ABNORMAL LOW (ref 26.0–34.0)
MCHC: 31 g/dL (ref 30.0–36.0)
MCV: 75.7 fL — AB (ref 78.0–100.0)
Platelets: 142 10*3/uL — ABNORMAL LOW (ref 150–400)
RBC: 5.03 MIL/uL (ref 3.87–5.11)
RDW: 14.6 % (ref 11.5–15.5)
WBC: 5.2 10*3/uL (ref 4.0–10.5)

## 2017-05-26 LAB — BASIC METABOLIC PANEL
Anion gap: 10 (ref 5–15)
BUN: 19 mg/dL (ref 6–20)
CALCIUM: 8.8 mg/dL — AB (ref 8.9–10.3)
CHLORIDE: 108 mmol/L (ref 101–111)
CO2: 24 mmol/L (ref 22–32)
CREATININE: 1.74 mg/dL — AB (ref 0.44–1.00)
GFR calc Af Amer: 28 mL/min — ABNORMAL LOW (ref >60)
GFR calc non Af Amer: 24 mL/min — ABNORMAL LOW (ref >60)
GLUCOSE: 145 mg/dL — AB (ref 65–99)
Potassium: 3.3 mmol/L — ABNORMAL LOW (ref 3.5–5.1)
Sodium: 142 mmol/L (ref 135–145)

## 2017-05-26 LAB — GLUCOSE, CAPILLARY
GLUCOSE-CAPILLARY: 145 mg/dL — AB (ref 65–99)
GLUCOSE-CAPILLARY: 115 mg/dL — AB (ref 65–99)
Glucose-Capillary: 110 mg/dL — ABNORMAL HIGH (ref 65–99)
Glucose-Capillary: 185 mg/dL — ABNORMAL HIGH (ref 65–99)

## 2017-05-26 MED ORDER — HYDRALAZINE HCL 50 MG PO TABS
50.0000 mg | ORAL_TABLET | Freq: Three times a day (TID) | ORAL | Status: DC
  Administered 2017-05-26 – 2017-05-29 (×6): 50 mg via ORAL
  Filled 2017-05-26 (×8): qty 1

## 2017-05-26 MED ORDER — POTASSIUM CHLORIDE CRYS ER 20 MEQ PO TBCR
40.0000 meq | EXTENDED_RELEASE_TABLET | Freq: Four times a day (QID) | ORAL | Status: AC
  Administered 2017-05-26: 40 meq via ORAL
  Filled 2017-05-26: qty 2

## 2017-05-26 MED ORDER — METRONIDAZOLE 500 MG PO TABS
500.0000 mg | ORAL_TABLET | Freq: Three times a day (TID) | ORAL | Status: DC
  Administered 2017-05-26 – 2017-05-29 (×7): 500 mg via ORAL
  Filled 2017-05-26 (×8): qty 1
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
## 2017-05-26 NOTE — Progress Notes (Signed)
@IPLOG @        PROGRESS NOTE                                                                                                                                                                                                             Patient Demographics:    Allison Rodgers, is a 83 y.o. female, DOB - 1924-01-13, HYQ:[id number]  Admit date - 05/24/2017   Admitting Physician Gennaro Africa, MD  Outpatient Primary MD for the patient is Sande Brothers, MD  LOS - 2  Chief Complaint  Patient presents with  . Dysuria       Brief Narrative  Pt is a 83 yo female NH resident who was brought with cc of AMS over the last few weeks along with worsening abdominal pain and decreased communication with family/staff. The patient had her most decline over the last 24 hours as she keeps sleeping and barely responding to any commands which is unusual for her despite having dementia at baseline. Otherwise no N/V/D/cough/chest pain/fever/chills per family who provided the history. She has been treated previously for diverticulitis and she is being treated now for UTI with levoquin.     Subjective:   Patient in bed, appears comfortable, denies any headache, no fever, no chest pain or pressure, no shortness of breath , no abdominal pain. No focal weakness. .     Assessment  & Plan :     1. Delirium in a patient with advanced dementia likely due to mild acute on chronic diverticulitis caused by obstipation and extremely large stool burden. Patient has known history of recurrent diverticulitis, known history of colovesical  Fistula - discussed her case with surgeon Dr. Philip Aspen on 95/28/4132, she has seen the patient few months ago for the same problem and at that time it was decided that patient will not be a good surgical candidate and that her problems will be managed medically.   In my opinion most of her problems were due to massive burden obstipation, much improved after bowel regimen and multiple  bowel movements, continue bowel regimen, if stable discharge back to ALF tomorrow.e will taper down flagyl to oral. Rocephin continue for now.  2. DM type II. Hold oral medications for now place on sliding scale.  CBG (last 3)   Recent Labs  05/25/17 1244 05/25/17 2110 05/26/17 0727  GLUCAP 114* 114* 110*     3. Recurrent UTIs. Due to her known history of colovesical fistula. She will be chronically colonized. Will not treat unless there are  clear signs and symptoms of UTI.  4. Underlying dementia. Home medications continued. Remains at risk for delirium. Minimize narcotics and benzodiazepines. Use Haldol when necessary if needed for agitation.  5.CKD 5. Creatinine at baseline appears to be close to 1.8. She is at baseline, continue gentle hydration and monitor.  6. HTN - on Norvasc added hydralazine for better control.    Diet : Diet full liquid Room service appropriate? Yes; Fluid consistency: Thin   Family Communication  :  son  Code Status :  Full  Disposition Plan  :  ALF in am  Consults  :  None  Procedures  :    CT head and C-spine - nonacute.  CT abdomen and pelvis. 1. Unchanged wall thickening and inflammatory changes surrounding a relatively long segment of sigmoid colon. Findings could reflect recurrent diverticulitis, however given the similar appearance and location to the prior study, underlying colon cancer cannot be entirely excluded. Correlate with recent colonoscopy. 2. Large amount of stool within the remaining colon proximal to the area of sigmoid wall thickening. Correlate for constipation. 3.  Aortic atherosclerosis    DVT Prophylaxis  :  Heparin   Lab Results  Component Value Date   PLT 142 (L) 05/26/2017    Inpatient Medications  Scheduled Meds: . amLODipine  10 mg Oral Daily  . bisacodyl  10 mg Rectal BID  . chlorhexidine  15 mL Mouth Rinse BID  . docusate sodium  200 mg Oral BID  . famotidine  20 mg Oral Daily  . folic acid  1 mg  Oral Daily  . heparin  5,000 Units Subcutaneous Q12H  . insulin aspart  0-9 Units Subcutaneous TID WC  . insulin glargine  5 Units Subcutaneous QHS  . latanoprost  1 drop Both Eyes QHS  . mouth rinse  15 mL Mouth Rinse q12n4p  . metroNIDAZOLE  500 mg Oral Q8H  . mirtazapine  15 mg Oral QHS  . polyethylene glycol  17 g Oral BID  . potassium chloride  40 mEq Oral Q6H   Continuous Infusions: . cefTRIAXone (ROCEPHIN)  IV 1 g (05/26/17 0936)   PRN Meds:.acetaminophen, hydrALAZINE  Antibiotics  :    Anti-infectives    Start     Dose/Rate Route Frequency Ordered Stop   05/26/17 1400  metroNIDAZOLE (FLAGYL) tablet 500 mg     500 mg Oral Every 8 hours 05/26/17 1136     05/25/17 2200  ciprofloxacin (CIPRO) IVPB 400 mg  Status:  Discontinued     400 mg 200 mL/hr over 60 Minutes Intravenous Every 24 hours 05/25/17 0439 05/25/17 0722   05/25/17 0800  cefTRIAXone (ROCEPHIN) 1 g in dextrose 5 % 50 mL IVPB     1 g 100 mL/hr over 30 Minutes Intravenous Every 24 hours 05/25/17 0722 05/28/17 0759   05/24/17 2245  ciprofloxacin (CIPRO) IVPB 400 mg     400 mg 200 mL/hr over 60 Minutes Intravenous  Once 05/24/17 2235 05/25/17 0117   05/24/17 2230  metroNIDAZOLE (FLAGYL) IVPB 500 mg  Status:  Discontinued     500 mg 100 mL/hr over 60 Minutes Intravenous Every 8 hours 05/24/17 2211 05/26/17 1136   05/24/17 2145  ciprofloxacin (CIPRO) IVPB 200 mg  Status:  Discontinued     200 mg 100 mL/hr over 60 Minutes Intravenous  Once 05/24/17 2138 05/24/17 2235   05/24/17 2145  metroNIDAZOLE (FLAGYL) IVPB 500 mg  Status:  Discontinued     500 mg 100 mL/hr over 60  Minutes Intravenous  Once 05/24/17 2138 05/24/17 2211         Objective:   Vitals:   05/25/17 1347 05/25/17 2005 05/26/17 0429 05/26/17 0942  BP: (!) 180/55 (!) 179/60 (!) 177/72 (!) 177/72  Pulse: 65 63 84   Resp: 16 14 16    Temp: 97.7 F (36.5 C) 98.1 F (36.7 C) 97.8 F (36.6 C)   TempSrc: Oral Oral Oral   SpO2: 97% 100% 99%    Weight:      Height:        Wt Readings from Last 3 Encounters:  05/25/17 77.1 kg (170 lb)  05/05/17 71.9 kg (158 lb 9.6 oz)  03/04/17 68.1 kg (150 lb 3.2 oz)     Intake/Output Summary (Last 24 hours) at 05/26/17 1137 Last data filed at 05/26/17 0430  Gross per 24 hour  Intake           650.83 ml  Output             1150 ml  Net          -499.17 ml     Physical Exam  Awake , confused at baseline, No new F.N deficits, Normal affect [place].AT,PERRAL Supple Neck,No JVD, No cervical lymphadenopathy appriciated.  Symmetrical Chest wall movement, Good air movement bilaterally, CTAB RRR,No Gallops,Rubs or new Murmurs, No Parasternal Heave +ve B.Sounds, Abd Soft, No tenderness, No organomegaly appriciated, No rebound - guarding or rigidity. No Cyanosis, Clubbing or edema, No new Rash or bruise      Data Review:    CBC  Recent Labs Lab 05/24/17 1524 05/25/17 0047 05/26/17 0858  WBC 7.7 5.1 5.2  HGB 11.1* 12.0 11.8*  HCT 36.1 37.8 38.1  PLT 172 141* 142*  MCV 76.5* 76.1* 75.7*  MCH 23.5* 24.1* 23.5*  MCHC 30.7 31.7 31.0  RDW 14.8 14.7 14.6  LYMPHSABS 1.6  --   --   MONOABS 0.7  --   --   EOSABS 0.2  --   --   BASOSABS 0.0  --   --     Chemistries   Recent Labs Lab 05/24/17 1630 05/25/17 0047 05/26/17 0858  NA 142 143 142  K 4.7 3.7 3.3*  CL 109 111 108  CO2 23 26 24   GLUCOSE 118* 99 145*  BUN 22* 22* 19  CREATININE 1.87* 1.81* 1.74*  CALCIUM 9.0 9.1 8.8*  AST 38 23  --   ALT 15 14  --   ALKPHOS 86 84  --   BILITOT 0.7 0.6  --    ------------------------------------------------------------------------------------------------------------------ No results for input(s): CHOL, HDL, LDLCALC, TRIG, CHOLHDL, LDLDIRECT in the last 72 hours.  Lab Results  Component Value Date   HGBA1C 6.5 03/04/2017   ------------------------------------------------------------------------------------------------------------------ No results for input(s): TSH, T4TOTAL,  T3FREE, THYROIDAB in the last 72 hours.  Invalid input(s): FREET3 ------------------------------------------------------------------------------------------------------------------ No results for input(s): VITAMINB12, FOLATE, FERRITIN, TIBC, IRON, RETICCTPCT in the last 72 hours.  Coagulation profile No results for input(s): INR, PROTIME in the last 168 hours.  No results for input(s): DDIMER in the last 72 hours.  Cardiac Enzymes  Recent Labs Lab 05/25/17 0047  TROPONINI <0.03   ------------------------------------------------------------------------------------------------------------------ No results found for: BNP  Micro Results Recent Results (from the past 240 hour(s))  MRSA PCR Screening     Status: None   Collection Time: 05/25/17 12:25 AM  Result Value Ref Range Status   MRSA by PCR NEGATIVE NEGATIVE Final    Comment:  The GeneXpert MRSA Assay (FDA approved for NASAL specimens only), is one component of a comprehensive MRSA colonization surveillance program. It is not intended to diagnose MRSA infection nor to guide or monitor treatment for MRSA infections.     Radiology Reports Ct Abdomen Pelvis Wo Contrast  Result Date: 05/24/2017 CLINICAL DATA:  Abdominal pain. EXAM: CT ABDOMEN AND PELVIS WITHOUT CONTRAST TECHNIQUE: Multidetector CT imaging of the abdomen and pelvis was performed following the standard protocol without IV contrast. COMPARISON:  CT abdomen and pelvis dated October 24, 2016. FINDINGS: Lower chest: Subsegmental atelectasis.  No acute abnormality. Hepatobiliary: No focal liver abnormality is seen. Status post cholecystectomy. No biliary dilatation. Pancreas: Atrophic. No ductal dilatation or surrounding inflammatory changes. Spleen: Normal in size without focal abnormality. Adrenals/Urinary Tract: The adrenal glands are unremarkable. Stable 1.5 cm cyst in the left upper pole. No renal calculi or hydronephrosis. The bladder is unremarkable.  Stomach/Bowel: The stomach is within normal limits. No bowel obstruction. Left-sided diverticulosis with unchanged wall thickening of a relatively long segment of the sigmoid colon with mild surrounding inflammatory changes. Large amount of stool within the remaining colon. Vascular/Lymphatic: Aortic atherosclerosis. No enlarged abdominal or pelvic lymph nodes. Reproductive: Status post hysterectomy. No adnexal masses. Other: No free fluid or pneumoperitoneum. Musculoskeletal: No acute or significant osseous findings. Degenerative changes of the thoracolumbar spine, including 8 mm anterolisthesis at L4-L5, are unchanged. IMPRESSION: 1. Unchanged wall thickening and inflammatory changes surrounding a relatively long segment of sigmoid colon. Findings could reflect recurrent diverticulitis, however given the similar appearance and location to the prior study, underlying colon cancer cannot be entirely excluded. Correlate with recent colonoscopy. 2. Large amount of stool within the remaining colon proximal to the area of sigmoid wall thickening. Correlate for constipation. 3.  Aortic atherosclerosis (ICD10-I70.0). Electronically Signed   By: Titus Dubin M.D.   On: 05/24/2017 19:36   Dg Chest 2 View  Result Date: 05/24/2017 CLINICAL DATA:  Recent fall.  Weakness. EXAM: CHEST  2 VIEW COMPARISON:  09/29/2016 FINDINGS: Cardiomediastinal silhouette is normal. Mediastinal contours appear intact. Calcific atherosclerotic disease and tortuosity of the aorta. Stable eventration of the left hemidiaphragm. Streaky peribronchial opacities in the left lower lobe. Osseous structures are without acute abnormality. Soft tissues are grossly normal. IMPRESSION: Streaky peribronchial opacities in the left lower lobe may represent atelectasis versus airspace consolidation. No displaced fractures are seen. Electronically Signed   By: Fidela Salisbury M.D.   On: 05/24/2017 19:38   Ct Head Wo Contrast  Result Date:  05/24/2017 CLINICAL DATA:  "Pt BIB [facility] from [facility]. EMS reports that pt was recently dx'd with a UTI and has been on antibiotics for 3 of her 7 day prescription. She requested transfer today because she/family feels like she is not improving. Potential fall. EXAM: CT HEAD WITHOUT CONTRAST CT CERVICAL SPINE WITHOUT CONTRAST TECHNIQUE: Multidetector CT imaging of the head and cervical spine was performed following the standard protocol without intravenous contrast. Multiplanar CT image reconstructions of the cervical spine were also generated. COMPARISON:  Head CT 09/29/2016 FINDINGS: CT HEAD FINDINGS Brain: No intracranial hemorrhage. No parenchymal contusion. No midline shift or mass effect. Basilar cisterns are patent. No skull base fracture. No fluid in the paranasal sinuses or mastoid air cells. Orbits are normal. There are periventricular and subcortical white matter hypodensities. Generalized cortical atrophy. Vascular: No hyperdense vessel or unexpected calcification. Skull: Normal. Negative for fracture or focal lesion. Sinuses/Orbits: Paranasal sinuses and mastoid air cells are clear. Orbits are clear. Other: None.  CT CERVICAL SPINE FINDINGS Alignment: Normal alignment of the cervical vertebral bodies. Skull base and vertebrae: Normal craniocervical junction. No loss of bowel vertebral body height or disc height. Normal facet articulation. No evidence of fracture. Soft tissues and spinal canal: No prevertebral soft tissue swelling. No perispinal or epidural hematoma. Disc levels:  Multilevel disc osteophytic disease. Upper chest: Clear Other: None IMPRESSION: 1. No intracranial trauma. 2. Atrophy and white matter microvascular disease. 3. No cervical spine fracture. Electronically Signed   By: Suzy Bouchard M.D.   On: 05/24/2017 17:16   Ct Cervical Spine Wo Contrast  Result Date: 05/24/2017 CLINICAL DATA:  "Pt BIB [facility] from [facility]. EMS reports that pt was recently dx'd with a UTI  and has been on antibiotics for 3 of her 7 day prescription. She requested transfer today because she/family feels like she is not improving. Potential fall. EXAM: CT HEAD WITHOUT CONTRAST CT CERVICAL SPINE WITHOUT CONTRAST TECHNIQUE: Multidetector CT imaging of the head and cervical spine was performed following the standard protocol without intravenous contrast. Multiplanar CT image reconstructions of the cervical spine were also generated. COMPARISON:  Head CT 09/29/2016 FINDINGS: CT HEAD FINDINGS Brain: No intracranial hemorrhage. No parenchymal contusion. No midline shift or mass effect. Basilar cisterns are patent. No skull base fracture. No fluid in the paranasal sinuses or mastoid air cells. Orbits are normal. There are periventricular and subcortical white matter hypodensities. Generalized cortical atrophy. Vascular: No hyperdense vessel or unexpected calcification. Skull: Normal. Negative for fracture or focal lesion. Sinuses/Orbits: Paranasal sinuses and mastoid air cells are clear. Orbits are clear. Other: None. CT CERVICAL SPINE FINDINGS Alignment: Normal alignment of the cervical vertebral bodies. Skull base and vertebrae: Normal craniocervical junction. No loss of bowel vertebral body height or disc height. Normal facet articulation. No evidence of fracture. Soft tissues and spinal canal: No prevertebral soft tissue swelling. No perispinal or epidural hematoma. Disc levels:  Multilevel disc osteophytic disease. Upper chest: Clear Other: None IMPRESSION: 1. No intracranial trauma. 2. Atrophy and white matter microvascular disease. 3. No cervical spine fracture. Electronically Signed   By: Suzy Bouchard M.D.   On: 05/24/2017 17:16    Time Spent in minutes  30   Lala Lund M.D on 05/26/2017 at 11:37 AM  Between 7am to 7pm - Pager - [phone] ( page via [email or web address], text pages only, please mention full 10 digit call back number). After 7pm go to [email or web address] - password [email or web address]

## 2017-05-26 NOTE — Progress Notes (Signed)
Patient has water-filled blisters on the upper right arm. Seems to be from irritation from adhesive from tape, but unsure of the origin. Ice pack applied.

## 2017-05-26 NOTE — Evaluation (Signed)
Physical Therapy Evaluation Patient Details Name: Allison Rodgers MRN: [id number] DOB: [birth year] Today's Date: 05/26/2017   History of Present Illness  Pt is a 83 year old female admitted for delirium in a patient with advanced dementia likely due to mild acute on chronic diverticulitis caused by obstipation and extremely large stool burden.  Clinical Impression  Pt admitted with above diagnosis. Pt currently with functional limitations due to the deficits listed below (see PT Problem List).  Pt will benefit from skilled PT to increase their independence and safety with mobility to allow discharge to the venue listed below.  Pt assisted to [facility] and then ambulated in hallway good distance.  Pt from ALF memory care unit, and if pt able to return to ALF at current assist level, then recommend HHPT.      Follow Up Recommendations Supervision/Assistance - 24 hour;Home health PT    Equipment Recommendations  None recommended by PT    Recommendations for Other Services       Precautions / Restrictions Precautions Precautions: Fall      Mobility  Bed Mobility Overal bed mobility: Needs Assistance Bed Mobility: Supine to Sit     Supine to sit: Min assist;HOB elevated     General bed mobility comments: slight assist for trunk upright  Transfers Overall transfer level: Needs assistance Equipment used: Rolling walker (2 wheeled) Transfers: Sit to/from [facility] Sit to Stand: Min assist Stand pivot transfers: Min assist       General transfer comment: assist to rise and steady, step by step cues for pivoting to [facility] as pt reported urinary urgency  Ambulation/Gait Ambulation/Gait assistance: Min assist Ambulation Distance (Feet): 200 Feet Assistive device: Rolling walker (2 wheeled) Gait Pattern/deviations: Step-through pattern;Decreased stride length     General Gait Details: assist to manuever RW with tight spaces and turning  Stairs             Wheelchair Mobility    Modified Rankin (Stroke Patients Only)       Balance Overall balance assessment: History of Falls                                           Pertinent Vitals/Pain Pain Assessment: No/denies pain    Home Living Family/patient expects to be discharged to:: Assisted living               Home Equipment: Gilford Rile - 2 wheels      Prior Function Level of Independence: Independent with assistive device(s)         Comments: ambulatory with RW, ALF memory care unit     Hand Dominance        Extremity/Trunk Assessment        Lower Extremity Assessment Lower Extremity Assessment: Generalized weakness    Cervical / Trunk Assessment Cervical / Trunk Assessment: Normal  Communication   Communication: No difficulties  Cognition Arousal/Alertness: Awake/alert Behavior During Therapy: WFL for tasks assessed/performed Overall Cognitive Status: History of cognitive impairments - at baseline                                 General Comments: hx of dementia, likely near baseline, requires frequent cues to stay on task      General Comments      Exercises     Assessment/Plan  PT Assessment Patient needs continued PT services  PT Problem List Decreased mobility;Decreased strength;Decreased activity tolerance;Decreased balance;Decreased knowledge of use of DME;Decreased safety awareness       PT Treatment Interventions DME instruction;Gait training;Therapeutic exercise;Therapeutic activities;Patient/family education;Functional mobility training;Balance training    PT Goals (Current goals can be found in the Care Plan section)  Acute Rehab PT Goals PT Goal Formulation: With patient/family Time For Goal Achievement: 06/29/2017 Potential to Achieve Goals: Good    Frequency Min 3X/week   Barriers to discharge        Co-evaluation               AM-PAC PT "6 Clicks" Daily Activity  Outcome  Measure Difficulty turning over in bed (including adjusting bedclothes, sheets and blankets)?: None Difficulty moving from lying on back to sitting on the side of the bed? : A Little Difficulty sitting down on and standing up from a chair with arms (e.g., wheelchair, bedside commode, etc,.)?: Unable Help needed moving to and from a bed to chair (including a wheelchair)?: A Little Help needed walking in hospital room?: A Little Help needed climbing 3-5 steps with a railing? : A Lot 6 Click Score: 16    End of Session Equipment Utilized During Treatment: Gait belt Activity Tolerance: Patient tolerated treatment well Patient left: in chair;with chair alarm set;with call bell/phone within reach;with family/visitor present   PT Visit Diagnosis: Other abnormalities of gait and mobility (R26.89)    Time: 1121-6244 PT Time Calculation (min) (ACUTE ONLY): 22 min   Charges:   PT Evaluation $PT Eval Low Complexity: 1 Low     PT G CodesCarmelia Bake, PT, DPT 05/26/2017 Pager: [phone]  York Ram E 05/26/2017, 12:26 PM

## 2017-05-27 ENCOUNTER — Encounter (HOSPITAL_COMMUNITY): Payer: Self-pay | Admitting: Radiology

## 2017-05-27 ENCOUNTER — Inpatient Hospital Stay (HOSPITAL_COMMUNITY): Payer: Medicare Other

## 2017-05-27 LAB — BASIC METABOLIC PANEL
Anion gap: 6 (ref 5–15)
BUN: 17 mg/dL (ref 6–20)
CO2: 24 mmol/L (ref 22–32)
Calcium: 8.7 mg/dL — ABNORMAL LOW (ref 8.9–10.3)
Chloride: 112 mmol/L — ABNORMAL HIGH (ref 101–111)
Creatinine, Ser: 1.62 mg/dL — ABNORMAL HIGH (ref 0.44–1.00)
GFR calc Af Amer: 31 mL/min — ABNORMAL LOW (ref >60)
GFR calc non Af Amer: 27 mL/min — ABNORMAL LOW (ref >60)
GLUCOSE: 123 mg/dL — AB (ref 65–99)
POTASSIUM: 3.5 mmol/L (ref 3.5–5.1)
Sodium: 142 mmol/L (ref 135–145)

## 2017-05-27 LAB — GLUCOSE, CAPILLARY
GLUCOSE-CAPILLARY: 133 mg/dL — AB (ref 65–99)
GLUCOSE-CAPILLARY: 133 mg/dL — AB (ref 65–99)
Glucose-Capillary: 150 mg/dL — ABNORMAL HIGH (ref 65–99)

## 2017-05-27 LAB — MAGNESIUM: Magnesium: 1.7 mg/dL (ref 1.7–2.4)

## 2017-05-27 MED ORDER — POTASSIUM CHLORIDE 2 MEQ/ML IV SOLN
INTRAVENOUS | Status: AC
  Administered 2017-05-27: 14:00:00 via INTRAVENOUS
  Filled 2017-05-27 (×4): qty 1000

## 2017-05-27 MED ORDER — POTASSIUM CHLORIDE 2 MEQ/ML IV SOLN
INTRAVENOUS | Status: DC
  Filled 2017-05-27: qty 1000

## 2017-05-27 MED ORDER — IOPAMIDOL (ISOVUE-300) INJECTION 61%: 30.0000 mL | Freq: Once | INTRAVENOUS | Status: DC | PRN

## 2017-05-27 MED ORDER — KCL-LACTATED RINGERS 20 MEQ/L IV SOLN
INTRAVENOUS | Status: DC
  Filled 2017-05-27: qty 1000

## 2017-05-27 MED ORDER — IOPAMIDOL (ISOVUE-300) INJECTION 61%
INTRAVENOUS | Status: AC
  Filled 2017-05-27: qty 30

## 2017-05-27 MED ORDER — BISACODYL 5 MG PO TBEC
10.0000 mg | DELAYED_RELEASE_TABLET | Freq: Every day | ORAL | Status: DC
  Administered 2017-05-27 – 2017-05-29 (×3): 10 mg via ORAL
  Filled 2017-05-27 (×2): qty 2

## 2017-05-27 MED ORDER — DOCUSATE SODIUM 100 MG PO CAPS
200.0000 mg | ORAL_CAPSULE | Freq: Two times a day (BID) | ORAL | Status: DC
  Administered 2017-05-27 – 2017-05-29 (×4): 200 mg via ORAL
  Filled 2017-05-27 (×4): qty 2

## 2017-05-27 NOTE — Progress Notes (Signed)
This CM contacted son about DC plan. PT recommendations gone over with son and son does say he would like his mother to have HHPT. Allison Rodgers says that he has been speaking with a representative from [facility] on getting his mom some home health services and at this time he would like to continue communicating with [facility] on his own without my help. It was explained to Allison Rodgers that Allison Rodgers would need a MD order along with other patient information to process referral. Allison Rodgers states that he will handle that at this point and if he runs into trouble he will let me know. My cell number given to Allison Rodgers. Allison Doctor RN,BSN,NCM [phone]

## 2017-05-27 NOTE — Progress Notes (Signed)
Soap suds enema given with scant results.

## 2017-05-27 NOTE — Progress Notes (Signed)
@IPLOG @        PROGRESS NOTE                                                                                                                                                                                                             Patient Demographics:    Allison Rodgers, is a 83 y.o. female, DOB - 12-Aug-1924, ALP:[id number]  Admit date - 05/24/2017   Admitting Physician Gennaro Africa, MD  Outpatient Primary MD for the patient is Sande Brothers, MD  LOS - 3  Chief Complaint  Patient presents with  . Dysuria       Brief Narrative  Pt is a 83 yo female NH resident who was brought with cc of AMS over the last few weeks along with worsening abdominal pain and decreased communication with family/staff. The patient had her most decline over the last 24 hours as she keeps sleeping and barely responding to any commands which is unusual for her despite having dementia at baseline. Otherwise no N/V/D/cough/chest pain/fever/chills per family who provided the history. She has been treated previously for diverticulitis and this admission being treated for possible mild diverticulitis with evidence of large stool burden. Was going to be discharged today however developed abdominal pain and emesis, checking CT scan of the abdomen again to rule out any obstruction.    Subjective:   Patient in bed, appears comfortable, denies any headache, no fever, no chest pain or pressure, no shortness of breath ,  No focal weakness. Day she has some nausea with emesis along with mild generalized abdominal pain which is I change from yesterday. .     Assessment  & Plan :     1. Delirium in a patient with advanced dementia likely due to mild acute on chronic diverticulitis caused by obstipation and extremely large stool burden. Patient has known history of recurrent diverticulitis, known history of colovesical  Fistula - discussed her case with surgeon Dr. Philip Aspen on 35/32/9924, she has seen the patient few  months ago for the same problem and at that time it was decided that patient will not be a good surgical candidate and that her problems will be managed medically.   In my opinion most of her initial presenting problems were due to massive burden obstipation, she wasmuch improved after bowel regimen and multiple bowel movements, continue Rocephin for now, I was Going to discharge her back to ALF on 05/27/2017, owever this morning her abdomen is slightly more distended and tender, she has some nausea and emesis,  KUB shows diffuse gas all over, will check abdominal CT to rule out any obstruction. For now make her nothing by mouth.   2. DM type II. Hold oral medications for now place on sliding scale.  CBG (last 3)   Recent Labs  05/26/17 1715 05/26/17 2051 05/27/17 0746  GLUCAP 145* 115* 150*     3. Recurrent UTIs. Due to her known history of colovesical fistula. She will be chronically colonized. Will not treat unless there are clear signs and symptoms of UTI.  4. Underlying dementia. Home medications continued. Remains at risk for delirium. Minimize narcotics and benzodiazepines. Use Haldol when necessary if needed for agitation.  5.CKD 5. Creatinine at baseline appears to be close to 1.8. She is at baseline, continue gentle hydration and monitor.  6. HTN - on Norvasc added hydralazine for better control.    Diet : Diet NPO time specified Except for: Sips with Meds   Family Communication  :  son  Code Status :  Full  Disposition Plan  :  ALF when better  Consults  :  None  Procedures  :    CT head and C-spine - nonacute.  CT abdomen and pelvis. 1. Unchanged wall thickening and inflammatory changes surrounding a relatively long segment of sigmoid colon. Findings could reflect recurrent diverticulitis, however given the similar appearance and location to the prior study, underlying colon cancer cannot be entirely excluded. Correlate with recent colonoscopy. 2. Large amount of  stool within the remaining colon proximal to the area of sigmoid wall thickening. Correlate for constipation. 3.  Aortic atherosclerosis    DVT Prophylaxis  :  Heparin   Lab Results  Component Value Date   PLT 142 (L) 05/26/2017    Inpatient Medications  Scheduled Meds: . amLODipine  10 mg Oral Daily  . chlorhexidine  15 mL Mouth Rinse BID  . famotidine  20 mg Oral Daily  . folic acid  1 mg Oral Daily  . heparin  5,000 Units Subcutaneous Q12H  . hydrALAZINE  50 mg Oral Q8H  . insulin aspart  0-9 Units Subcutaneous TID WC  . insulin glargine  5 Units Subcutaneous QHS  . iopamidol      . latanoprost  1 drop Both Eyes QHS  . mouth rinse  15 mL Mouth Rinse q12n4p  . metroNIDAZOLE  500 mg Oral Q8H  . mirtazapine  15 mg Oral QHS   Continuous Infusions: . lactated ringers with kcl     PRN Meds:.acetaminophen, hydrALAZINE, iopamidol  Antibiotics  :    Anti-infectives    Start     Dose/Rate Route Frequency Ordered Stop   05/26/17 1400  metroNIDAZOLE (FLAGYL) tablet 500 mg     500 mg Oral Every 8 hours 05/26/17 1136     05/25/17 2200  ciprofloxacin (CIPRO) IVPB 400 mg  Status:  Discontinued     400 mg 200 mL/hr over 60 Minutes Intravenous Every 24 hours 05/25/17 0439 05/25/17 0722   05/25/17 0800  cefTRIAXone (ROCEPHIN) 1 g in dextrose 5 % 50 mL IVPB     1 g 100 mL/hr over 30 Minutes Intravenous Every 24 hours 05/25/17 0722 05/27/17 0815   05/24/17 2245  ciprofloxacin (CIPRO) IVPB 400 mg     400 mg 200 mL/hr over 60 Minutes Intravenous  Once 05/24/17 2235 05/25/17 0117   05/24/17 2230  metroNIDAZOLE (FLAGYL) IVPB 500 mg  Status:  Discontinued     500 mg 100 mL/hr over 60 Minutes Intravenous Every 8  hours 05/24/17 2211 05/26/17 1136   05/24/17 2145  ciprofloxacin (CIPRO) IVPB 200 mg  Status:  Discontinued     200 mg 100 mL/hr over 60 Minutes Intravenous  Once 05/24/17 2138 05/24/17 2235   05/24/17 2145  metroNIDAZOLE (FLAGYL) IVPB 500 mg  Status:  Discontinued     500  mg 100 mL/hr over 60 Minutes Intravenous  Once 05/24/17 2138 05/24/17 2211         Objective:   Vitals:   05/26/17 1425 05/26/17 2051 05/26/17 2156 05/27/17 0453  BP: (!) 150/82 (!) 145/113 (!) 159/66 (!) 172/59  Pulse:  84 76 84  Resp:  16 18 18   Temp:  98.3 F (36.8 C) 98.2 F (36.8 C) 99 F (37.2 C)  TempSrc:  Oral Oral Oral  SpO2:  97% 97% 98%  Weight:      Height:        Wt Readings from Last 3 Encounters:  05/25/17 77.1 kg (170 lb)  05/05/17 71.9 kg (158 lb 9.6 oz)  03/04/17 68.1 kg (150 lb 3.2 oz)     Intake/Output Summary (Last 24 hours) at 05/27/17 1244 Last data filed at 05/27/17 0300  Gross per 24 hour  Intake              600 ml  Output                0 ml  Net              600 ml     Physical Exam  Awake , confused at baseline, No new F.N deficits, Normal affect [place].AT,PERRAL Supple Neck,No JVD, No cervical lymphadenopathy appriciated.  Symmetrical Chest wall movement, Good air movement bilaterally, CTAB RRR,No Gallops,Rubs or new Murmurs, No Parasternal Heave +ve B.Sounds, Abd slightly distended today with mild epigastric tenderness, No organomegaly appriciated, No rebound - guarding or rigidity. No Cyanosis, Clubbing or edema, No new Rash or bruise      Data Review:    CBC  Recent Labs Lab 05/24/17 1524 05/25/17 0047 05/26/17 0858  WBC 7.7 5.1 5.2  HGB 11.1* 12.0 11.8*  HCT 36.1 37.8 38.1  PLT 172 141* 142*  MCV 76.5* 76.1* 75.7*  MCH 23.5* 24.1* 23.5*  MCHC 30.7 31.7 31.0  RDW 14.8 14.7 14.6  LYMPHSABS 1.6  --   --   MONOABS 0.7  --   --   EOSABS 0.2  --   --   BASOSABS 0.0  --   --     Chemistries   Recent Labs Lab 05/24/17 1630 05/25/17 0047 05/26/17 0858 05/27/17 0357  NA 142 143 142 142  K 4.7 3.7 3.3* 3.5  CL 109 111 108 112*  CO2 23 26 24 24   GLUCOSE 118* 99 145* 123*  BUN 22* 22* 19 17  CREATININE 1.87* 1.81* 1.74* 1.62*  CALCIUM 9.0 9.1 8.8* 8.7*  MG  --   --   --  1.7  AST 38 23  --   --   ALT 15 14   --   --   ALKPHOS 86 84  --   --   BILITOT 0.7 0.6  --   --    ------------------------------------------------------------------------------------------------------------------ No results for input(s): CHOL, HDL, LDLCALC, TRIG, CHOLHDL, LDLDIRECT in the last 72 hours.  Lab Results  Component Value Date   HGBA1C 6.5 03/04/2017   ------------------------------------------------------------------------------------------------------------------ No results for input(s): TSH, T4TOTAL, T3FREE, THYROIDAB in the last 72 hours.  Invalid input(s): FREET3 ------------------------------------------------------------------------------------------------------------------ No  results for input(s): VITAMINB12, FOLATE, FERRITIN, TIBC, IRON, RETICCTPCT in the last 72 hours.  Coagulation profile No results for input(s): INR, PROTIME in the last 168 hours.  No results for input(s): DDIMER in the last 72 hours.  Cardiac Enzymes  Recent Labs Lab 05/25/17 0047  TROPONINI <0.03   ------------------------------------------------------------------------------------------------------------------ No results found for: BNP  Micro Results Recent Results (from the past 240 hour(s))  MRSA PCR Screening     Status: None   Collection Time: 05/25/17 12:25 AM  Result Value Ref Range Status   MRSA by PCR NEGATIVE NEGATIVE Final    Comment:        The GeneXpert MRSA Assay (FDA approved for NASAL specimens only), is one component of a comprehensive MRSA colonization surveillance program. It is not intended to diagnose MRSA infection nor to guide or monitor treatment for MRSA infections.     Radiology Reports Ct Abdomen Pelvis Wo Contrast  Result Date: 05/24/2017 CLINICAL DATA:  Abdominal pain. EXAM: CT ABDOMEN AND PELVIS WITHOUT CONTRAST TECHNIQUE: Multidetector CT imaging of the abdomen and pelvis was performed following the standard protocol without IV contrast. COMPARISON:  CT abdomen and pelvis  dated October 24, 2016. FINDINGS: Lower chest: Subsegmental atelectasis.  No acute abnormality. Hepatobiliary: No focal liver abnormality is seen. Status post cholecystectomy. No biliary dilatation. Pancreas: Atrophic. No ductal dilatation or surrounding inflammatory changes. Spleen: Normal in size without focal abnormality. Adrenals/Urinary Tract: The adrenal glands are unremarkable. Stable 1.5 cm cyst in the left upper pole. No renal calculi or hydronephrosis. The bladder is unremarkable. Stomach/Bowel: The stomach is within normal limits. No bowel obstruction. Left-sided diverticulosis with unchanged wall thickening of a relatively long segment of the sigmoid colon with mild surrounding inflammatory changes. Large amount of stool within the remaining colon. Vascular/Lymphatic: Aortic atherosclerosis. No enlarged abdominal or pelvic lymph nodes. Reproductive: Status post hysterectomy. No adnexal masses. Other: No free fluid or pneumoperitoneum. Musculoskeletal: No acute or significant osseous findings. Degenerative changes of the thoracolumbar spine, including 8 mm anterolisthesis at L4-L5, are unchanged. IMPRESSION: 1. Unchanged wall thickening and inflammatory changes surrounding a relatively long segment of sigmoid colon. Findings could reflect recurrent diverticulitis, however given the similar appearance and location to the prior study, underlying colon cancer cannot be entirely excluded. Correlate with recent colonoscopy. 2. Large amount of stool within the remaining colon proximal to the area of sigmoid wall thickening. Correlate for constipation. 3.  Aortic atherosclerosis (ICD10-I70.0). Electronically Signed   By: Titus Dubin M.D.   On: 05/24/2017 19:36   Dg Chest 2 View  Result Date: 05/24/2017 CLINICAL DATA:  Recent fall.  Weakness. EXAM: CHEST  2 VIEW COMPARISON:  09/29/2016 FINDINGS: Cardiomediastinal silhouette is normal. Mediastinal contours appear intact. Calcific atherosclerotic disease and  tortuosity of the aorta. Stable eventration of the left hemidiaphragm. Streaky peribronchial opacities in the left lower lobe. Osseous structures are without acute abnormality. Soft tissues are grossly normal. IMPRESSION: Streaky peribronchial opacities in the left lower lobe may represent atelectasis versus airspace consolidation. No displaced fractures are seen. Electronically Signed   By: Fidela Salisbury M.D.   On: 05/24/2017 19:38   Ct Head Wo Contrast  Result Date: 05/24/2017 CLINICAL DATA:  "Pt BIB [facility] from [facility]. EMS reports that pt was recently dx'd with a UTI and has been on antibiotics for 3 of her 7 day prescription. She requested transfer today because she/family feels like she is not improving. Potential fall. EXAM: CT HEAD WITHOUT CONTRAST CT CERVICAL SPINE WITHOUT CONTRAST TECHNIQUE: Multidetector  CT imaging of the head and cervical spine was performed following the standard protocol without intravenous contrast. Multiplanar CT image reconstructions of the cervical spine were also generated. COMPARISON:  Head CT 09/29/2016 FINDINGS: CT HEAD FINDINGS Brain: No intracranial hemorrhage. No parenchymal contusion. No midline shift or mass effect. Basilar cisterns are patent. No skull base fracture. No fluid in the paranasal sinuses or mastoid air cells. Orbits are normal. There are periventricular and subcortical white matter hypodensities. Generalized cortical atrophy. Vascular: No hyperdense vessel or unexpected calcification. Skull: Normal. Negative for fracture or focal lesion. Sinuses/Orbits: Paranasal sinuses and mastoid air cells are clear. Orbits are clear. Other: None. CT CERVICAL SPINE FINDINGS Alignment: Normal alignment of the cervical vertebral bodies. Skull base and vertebrae: Normal craniocervical junction. No loss of bowel vertebral body height or disc height. Normal facet articulation. No evidence of fracture. Soft tissues and spinal canal: No prevertebral soft tissue  swelling. No perispinal or epidural hematoma. Disc levels:  Multilevel disc osteophytic disease. Upper chest: Clear Other: None IMPRESSION: 1. No intracranial trauma. 2. Atrophy and white matter microvascular disease. 3. No cervical spine fracture. Electronically Signed   By: Suzy Bouchard M.D.   On: 05/24/2017 17:16   Ct Cervical Spine Wo Contrast  Result Date: 05/24/2017 CLINICAL DATA:  "Pt BIB [facility] from [facility]. EMS reports that pt was recently dx'd with a UTI and has been on antibiotics for 3 of her 7 day prescription. She requested transfer today because she/family feels like she is not improving. Potential fall. EXAM: CT HEAD WITHOUT CONTRAST CT CERVICAL SPINE WITHOUT CONTRAST TECHNIQUE: Multidetector CT imaging of the head and cervical spine was performed following the standard protocol without intravenous contrast. Multiplanar CT image reconstructions of the cervical spine were also generated. COMPARISON:  Head CT 09/29/2016 FINDINGS: CT HEAD FINDINGS Brain: No intracranial hemorrhage. No parenchymal contusion. No midline shift or mass effect. Basilar cisterns are patent. No skull base fracture. No fluid in the paranasal sinuses or mastoid air cells. Orbits are normal. There are periventricular and subcortical white matter hypodensities. Generalized cortical atrophy. Vascular: No hyperdense vessel or unexpected calcification. Skull: Normal. Negative for fracture or focal lesion. Sinuses/Orbits: Paranasal sinuses and mastoid air cells are clear. Orbits are clear. Other: None. CT CERVICAL SPINE FINDINGS Alignment: Normal alignment of the cervical vertebral bodies. Skull base and vertebrae: Normal craniocervical junction. No loss of bowel vertebral body height or disc height. Normal facet articulation. No evidence of fracture. Soft tissues and spinal canal: No prevertebral soft tissue swelling. No perispinal or epidural hematoma. Disc levels:  Multilevel disc osteophytic disease. Upper chest:  Clear Other: None IMPRESSION: 1. No intracranial trauma. 2. Atrophy and white matter microvascular disease. 3. No cervical spine fracture. Electronically Signed   By: Suzy Bouchard M.D.   On: 05/24/2017 17:16    Time Spent in minutes  30   Lala Lund M.D on 05/27/2017 at 12:44 PM  Between 7am to 7pm - Pager - [phone] ( page via [email or web address], text pages only, please mention full 10 digit call back number). After 7pm go to [email or web address] - password [email or web address]

## 2017-05-28 DIAGNOSIS — J181 Lobar pneumonia, unspecified organism: Secondary | ICD-10-CM

## 2017-05-28 LAB — BASIC METABOLIC PANEL
Anion gap: 11 (ref 5–15)
BUN: 18 mg/dL (ref 6–20)
CHLORIDE: 110 mmol/L (ref 101–111)
CO2: 21 mmol/L — AB (ref 22–32)
CREATININE: 1.64 mg/dL — AB (ref 0.44–1.00)
Calcium: 8.7 mg/dL — ABNORMAL LOW (ref 8.9–10.3)
GFR calc Af Amer: 30 mL/min — ABNORMAL LOW (ref >60)
GFR, EST NON AFRICAN AMERICAN: 26 mL/min — AB (ref >60)
Glucose, Bld: 172 mg/dL — ABNORMAL HIGH (ref 65–99)
POTASSIUM: 3.2 mmol/L — AB (ref 3.5–5.1)
Sodium: 142 mmol/L (ref 135–145)

## 2017-05-28 LAB — CBC
HEMATOCRIT: 38.8 % (ref 36.0–46.0)
Hemoglobin: 12.5 g/dL (ref 12.0–15.0)
MCH: 24.1 pg — ABNORMAL LOW (ref 26.0–34.0)
MCHC: 32.2 g/dL (ref 30.0–36.0)
MCV: 74.8 fL — ABNORMAL LOW (ref 78.0–100.0)
PLATELETS: 172 10*3/uL (ref 150–400)
RBC: 5.19 MIL/uL — AB (ref 3.87–5.11)
RDW: 15 % (ref 11.5–15.5)
WBC: 7.9 10*3/uL (ref 4.0–10.5)

## 2017-05-28 LAB — GLUCOSE, CAPILLARY
GLUCOSE-CAPILLARY: 177 mg/dL — AB (ref 65–99)
GLUCOSE-CAPILLARY: 168 mg/dL — AB (ref 65–99)
Glucose-Capillary: 158 mg/dL — ABNORMAL HIGH (ref 65–99)
Glucose-Capillary: 157 mg/dL — ABNORMAL HIGH (ref 65–99)

## 2017-05-28 MED ORDER — LACTULOSE 10 GM/15ML PO SOLN
20.0000 g | Freq: Every day | ORAL | Status: DC
  Administered 2017-05-28 – 2017-05-29 (×2): 20 g via ORAL
  Filled 2017-05-28 (×2): qty 30

## 2017-05-28 MED ORDER — METRONIDAZOLE IN NACL 5-0.79 MG/ML-% IV SOLN
500.0000 mg | INTRAVENOUS | Status: AC
  Administered 2017-05-28: 500 mg via INTRAVENOUS
  Filled 2017-05-28: qty 100

## 2017-05-28 MED ORDER — CIPROFLOXACIN IN D5W 200 MG/100ML IV SOLN
200.0000 mg | Freq: Two times a day (BID) | INTRAVENOUS | Status: DC
  Administered 2017-05-28 – 2017-05-29 (×2): 200 mg via INTRAVENOUS
  Filled 2017-05-28 (×4): qty 100

## 2017-05-28 MED ORDER — CIPROFLOXACIN IN D5W 200 MG/100ML IV SOLN
200.0000 mg | INTRAVENOUS | Status: DC
  Filled 2017-05-28: qty 100

## 2017-05-28 MED ORDER — SODIUM CHLORIDE 0.9 % IV SOLN
INTRAVENOUS | Status: DC
  Administered 2017-05-28 – 2017-05-29 (×2): via INTRAVENOUS

## 2017-05-28 NOTE — Care Management Important Message (Signed)
Important Message  Patient Details  Name: ELISKA HAMIL MRN: [id number] Date of Birth: 11-06-1923   Medicare Important Message Given:  Yes    Kerin Salen 05/28/2017, 12:12 [place] Message  Patient Details  Name: MURLENE REVELL MRN: [id number] Date of Birth: 10-24-1923   Medicare Important Message Given:  Yes    Kerin Salen 05/28/2017, 12:06 PM

## 2017-05-28 NOTE — Progress Notes (Signed)
Physical Therapy Treatment Patient Details Name: Allison Rodgers MRN: [id number] DOB: 11-Jan-1924 Today's Date: 05/28/2017    History of Present Illness Pt is a 83 year old female admitted for delirium in a patient with advanced dementia likely due to mild acute on chronic diverticulitis caused by obstipation and extremely large stool burden.    PT Comments    The patient was participatory today, requires extra time and  Slow mobilizing, not  Being pushy  Or too fast. Continue PT. Per chart, plans DC to home.    Follow Up Recommendations  Supervision/Assistance - 24 hour;Home health PT     Equipment Recommendations  None recommended by PT    Recommendations for Other Services       Precautions / Restrictions Precautions Precautions: Fall Precaution Comments: can be agitated, doesn't like to be pushed    Mobility  Bed Mobility   Bed Mobility: Supine to Sit;Sit to Supine     Supine to sit: Min assist;HOB elevated Sit to supine: Min assist   General bed mobility comments: slight assist for trunk upright, pt. indicates wanting to do for self. extra time for activity, assist legs to return to bed  Transfers Overall transfer level: Needs assistance Equipment used: Rolling walker (2 wheeled) Transfers: Sit to/from Stand Sit to Stand: Min assist Stand pivot transfers: Min assist       General transfer comment: assist to rise and steady, stood from toilet, washed hands standing  Ambulation/Gait Ambulation/Gait assistance: Min assist Ambulation Distance (Feet): 200 Feet Assistive device: Rolling walker (2 wheeled) Gait Pattern/deviations: Step-through pattern;Decreased stride length     General Gait Details: assist to manuever RW with tight spaces and turning   Stairs            Wheelchair Mobility    Modified Rankin (Stroke Patients Only)       Balance Overall balance assessment: History of Falls;Needs assistance Sitting-balance support: Feet  supported;No upper extremity supported Sitting balance-Leahy Scale: Good Sitting balance - Comments: crossed  leg to place sock.   Standing balance support: During functional activity;No upper extremity supported Standing balance-Leahy Scale: Poor Standing balance comment: propped to wash hands at sink                            Cognition Arousal/Alertness: Awake/alert Behavior During Therapy: WFL for tasks assessed/performed Overall Cognitive Status: History of cognitive impairments - at baseline                                 General Comments: hx of dementia, likely near baseline, requires frequent cues to stay on task      Exercises      General Comments        Pertinent Vitals/Pain Pain Assessment: Faces Faces Pain Scale: No hurt    Home Living                      Prior Function            PT Goals (current goals can now be found in the care plan section) Progress towards PT goals: Progressing toward goals    Frequency    Min 3X/week      PT Plan Current plan remains appropriate    Co-evaluation              AM-PAC PT "6 Clicks" Daily Activity  Outcome Measure  Difficulty turning over in bed (including adjusting bedclothes, sheets and blankets)?: A Little Difficulty moving from lying on back to sitting on the side of the bed? : A Little Difficulty sitting down on and standing up from a chair with arms (e.g., wheelchair, bedside commode, etc,.)?: A Little Help needed moving to and from a bed to chair (including a wheelchair)?: A Little Help needed walking in hospital room?: A Little Help needed climbing 3-5 steps with a railing? : A Lot 6 Click Score: 17    End of Session Equipment Utilized During Treatment: Gait belt Activity Tolerance: Patient tolerated treatment well Patient left: in bed;with call bell/phone within reach;with bed alarm set Nurse Communication: Mobility status PT Visit Diagnosis: Other  abnormalities of gait and mobility (R26.89)     Time: 3428-7681 PT Time Calculation (min) (ACUTE ONLY): 27 min  Charges:  $Gait Training: 8-22 mins $Self Care/Home Management: 8-22                    G Codes:          Claretha Cooper 05/28/2017, 4:03 PM

## 2017-05-28 NOTE — Progress Notes (Deleted)
PHARMACY NOTE:  ANTIMICROBIAL RENAL DOSAGE ADJUSTMENT  Current antimicrobial regimen includes a mismatch between antimicrobial dosage and estimated renal function.  As per policy approved by the Pharmacy & Therapeutics and Medical Executive Committees, the antimicrobial dosage will be adjusted accordingly.  Current antimicrobial dosage:  Cipro 400mg  IV q12h  Indication: Diverticulitis  Renal Function:  Estimated Creatinine Clearance: 20.5 mL/min (A) (by C-G formula based on SCr of 1.64 mg/dL (H)). []      On intermittent HD, scheduled: []      On CRRT    Antimicrobial dosage has been changed to:  Cipro 400mg  IV q24h Additional comments:   Thank you for allowing pharmacy to be a part of this patient's care.  Biagio Borg, [facility] 05/28/2017 3:19 PM

## 2017-05-28 NOTE — Progress Notes (Signed)
PROGRESS NOTE    Allison Rodgers  DUK:[id number] DOB: 1924-04-06 DOA: 05/24/2017 PCP: Sande Brothers, MD    Brief Narrative:  83 yo female NH resident who was brought with cc of AMS over the last few weeks along with worsening abdominal pain and decreased communication with family/staff. The patient had her most decline over the last 24 hours as she keeps sleeping and barely responding to any commands which is unusual for her despite having dementia at baseline. Otherwise no N/V/D/cough/chest pain/fever/chills per family who provided the history. She has been treated previously for diverticulitis and this admission being treated for possible mild diverticulitis with evidence of large stool burden. Was going to be discharged today however developed abdominal pain and emesis, checking CT scan of the abdomen again to rule out any obstruction. ct scan did not show any evidence of obstrution.but stools noteed.patient has not had any more vomiting,has chronic abdominal pain.  Assessment & Plan:   Active Problems:   Diabetes mellitus type 2, controlled ([place])   Acute encephalopathy   DM2 (diabetes mellitus, type 2) (HCC)   Diverticulitis   Chronic abdominal pain/obstipation colovescical fistula/recurrent uti ckd satge 5 Dementia Dm Diverticulitis on cipro/flagyl  PLAN advance diet.slow iv hydration.if does well dc in am.  DVT prophylaxis: heparin Family Communication son Disposition Plan tomorrow if able to take po.   Consultants: none   Procedures: none Antimicrobials: none   Subjective: Resting in bed.no new complaints  Objective: Vitals:   05/27/17 0453 05/27/17 2317 05/28/17 0415 05/28/17 1347  BP: (!) 172/59 (!) 162/77 (!) 147/83 (!) 172/70  Pulse: 84 (!) 110 94 95  Resp: 18  20 18   Temp: 99 F (37.2 C)  98.6 F (37 C) 99 F (37.2 C)  TempSrc: Oral  Oral Axillary  SpO2: 98% 97% 99% 96%  Weight:      Height:       No intake or output data in the 24 hours ending  05/28/17 1459 Filed Weights   05/25/17 0002  Weight: 77.1 kg (170 lb)    Examination:  General exam: Appears calm and comfortable  Respiratory system: Clear to auscultation. Respiratory effort normal. Cardiovascular system: S1 & S2 heard, RRR. No JVD, murmurs, rubs, gallops or clicks. No pedal edema. Gastrointestinal system: Abdomen is nondistended, soft and  Generalized tender. No organomegaly or masses felt. Normal bowel sounds heard. Central nervous system: Alert and oriented. No focal neurological deficits. Extremities: Symmetric 5 x 5 power. Skin: No rashes, lesions or ulcers Psychiatry: Judgement and insight appear normal. Mood & affect appropriate.     Data Reviewed: I have personally reviewed following labs and imaging studies  CBC:  Recent Labs Lab 05/24/17 1524 05/25/17 0047 05/26/17 0858 05/28/17 0349  WBC 7.7 5.1 5.2 7.9  NEUTROABS 5.3  --   --   --   HGB 11.1* 12.0 11.8* 12.5  HCT 36.1 37.8 38.1 38.8  MCV 76.5* 76.1* 75.7* 74.8*  PLT 172 141* 142* 376   Basic Metabolic Panel:  Recent Labs Lab 05/24/17 1630 05/25/17 0047 05/26/17 0858 05/27/17 0357 05/28/17 0349  NA 142 143 142 142 142  K 4.7 3.7 3.3* 3.5 3.2*  CL 109 111 108 112* 110  CO2 23 26 24 24  21*  GLUCOSE 118* 99 145* 123* 172*  BUN 22* 22* 19 17 18   CREATININE 1.87* 1.81* 1.74* 1.62* 1.64*  CALCIUM 9.0 9.1 8.8* 8.7* 8.7*  MG  --   --   --  1.7  --  GFR: Estimated Creatinine Clearance: 20.5 mL/min (A) (by C-G formula based on SCr of 1.64 mg/dL (H)). Liver Function Tests:  Recent Labs Lab 05/24/17 1630 05/25/17 0047  AST 38 23  ALT 15 14  ALKPHOS 86 84  BILITOT 0.7 0.6  PROT 7.1 6.8  ALBUMIN 3.2* 3.1*   No results for input(s): LIPASE, AMYLASE in the last 168 hours.  Recent Labs Lab 05/24/17 1632  AMMONIA 20   Coagulation Profile: No results for input(s): INR, PROTIME in the last 168 hours. Cardiac Enzymes:  Recent Labs Lab 05/25/17 0047  TROPONINI <0.03   BNP  (last 3 results) No results for input(s): PROBNP in the last 8760 hours. HbA1C: No results for input(s): HGBA1C in the last 72 hours. CBG:  Recent Labs Lab 05/26/17 2051 05/27/17 0746 05/27/17 1515 05/28/17 0826 05/28/17 1206  GLUCAP 115* 150* 133* 158* 177*   Lipid Profile: No results for input(s): CHOL, HDL, LDLCALC, TRIG, CHOLHDL, LDLDIRECT in the last 72 hours. Thyroid Function Tests: No results for input(s): TSH, T4TOTAL, FREET4, T3FREE, THYROIDAB in the last 72 hours. Anemia Panel: No results for input(s): VITAMINB12, FOLATE, FERRITIN, TIBC, IRON, RETICCTPCT in the last 72 hours. Sepsis Labs:  Recent Labs Lab 05/24/17 1736  LATICACIDVEN 1.09    Recent Results (from the past 240 hour(s))  MRSA PCR Screening     Status: None   Collection Time: 05/25/17 12:25 AM  Result Value Ref Range Status   MRSA by PCR NEGATIVE NEGATIVE Final    Comment:        The GeneXpert MRSA Assay (FDA approved for NASAL specimens only), is one component of a comprehensive MRSA colonization surveillance program. It is not intended to diagnose MRSA infection nor to guide or monitor treatment for MRSA infections.          Radiology Studies: Ct Abdomen Pelvis Wo Contrast  Result Date: 05/27/2017 CLINICAL DATA:  Abdominal pain and distension EXAM: CT ABDOMEN AND PELVIS WITHOUT CONTRAST TECHNIQUE: Multidetector CT imaging of the abdomen and pelvis was performed following the standard protocol without IV contrast. COMPARISON:  05/24/2017 FINDINGS: Lower chest: Small bilateral pleural effusions are noted with mild left basilar atelectasis. This is new from the prior exam. Hepatobiliary: No focal liver abnormality is seen. Status post cholecystectomy. No biliary dilatation. Pancreas: Unremarkable. No pancreatic ductal dilatation or surrounding inflammatory changes. Spleen: Normal in size without focal abnormality. Adrenals/Urinary Tract: The adrenal glands are within normal limits. A  hypodensity is noted within the left kidney consistent with cystic lesions stable from the prior exam. No renal calculi or urinary tract obstructive changes are seen. The bladder remains well distended. Stomach/Bowel: Diverticular changes again seen throughout the colon with diffuse wall thickening. There remain some mild prominence of the colon proximal to this similar to that noted on the prior exam. No other obstructive or inflammatory changes are noted. The appendix has been surgically removed. Vascular/Lymphatic: Aortic atherosclerosis. No enlarged abdominal or pelvic lymph nodes. Reproductive: Status post hysterectomy. No adnexal masses. Other: No abdominal wall hernia or abnormality. No abdominopelvic ascites. Musculoskeletal: Degenerative changes of lumbar spine are noted. Chronic anterolisthesis of L4 on L5 is noted. IMPRESSION: Stable appearing changes of diverticulosis and smooth muscle wall thickening in the sigmoid colon. Relative dilatation of the colon with retention of fecal material is noted proximally this similar to that seen on the prior exam. Postoperative and chronic changes similar to that seen on the prior exam. No new focal abnormality is noted. Electronically Signed   By: Elta Guadeloupe  Lukens M.D.   On: 05/27/2017 13:36   Dg Abd 2 Views  Result Date: 05/27/2017 CLINICAL DATA:  Nausea, history of diverticulitis, gastritis, hiatal hernia, diabetes mellitus, GERD EXAM: ABDOMEN - 2 VIEW COMPARISON:  05/26/2017 FINDINGS: Minimal gas distention of transverse colon. Normal in caliber scattered air-filled loops of large and small bowel throughout remainder of abdomen. No evidence of bowel obstruction or bowel wall thickening. No free intraperitoneal air. Mild atelectasis versus consolidation in LEFT lower lobe. Surgical clips RIGHT upper quadrant likely reflect prior cholecystectomy. Osseous demineralization with degenerative disc and facet disease changes of the thoracolumbar spine. Scattered pelvic  phleboliths without definite urinary tract calcification. IMPRESSION: Nonspecific bowel gas pattern. Question atelectasis versus consolidation LEFT lower lobe. Electronically Signed   By: Lavonia Dana M.D.   On: 05/27/2017 09:08        Scheduled Meds: . amLODipine  10 mg Oral Daily  . bisacodyl  10 mg Oral Daily  . chlorhexidine  15 mL Mouth Rinse BID  . docusate sodium  200 mg Oral BID  . famotidine  20 mg Oral Daily  . folic acid  1 mg Oral Daily  . heparin  5,000 Units Subcutaneous Q12H  . hydrALAZINE  50 mg Oral Q8H  . insulin aspart  0-9 Units Subcutaneous TID WC  . insulin glargine  5 Units Subcutaneous QHS  . latanoprost  1 drop Both Eyes QHS  . mouth rinse  15 mL Mouth Rinse q12n4p  . metroNIDAZOLE  500 mg Oral Q8H  . mirtazapine  15 mg Oral QHS   Continuous Infusions: . sodium chloride 75 mL/hr at 05/28/17 1057     LOS: 4 days     Georgette Shell, MD [facility]  If 7PM-7AM, please contact night-coverage [email or web address] Password [email or web address] 05/28/2017, 2:59 PM

## 2017-05-29 LAB — GLUCOSE, CAPILLARY
GLUCOSE-CAPILLARY: 195 mg/dL — AB (ref 65–99)
Glucose-Capillary: 170 mg/dL — ABNORMAL HIGH (ref 65–99)

## 2017-05-29 LAB — BASIC METABOLIC PANEL
Anion gap: 9 (ref 5–15)
BUN: 28 mg/dL — AB (ref 6–20)
CALCIUM: 8.8 mg/dL — AB (ref 8.9–10.3)
CO2: 23 mmol/L (ref 22–32)
CREATININE: 1.8 mg/dL — AB (ref 0.44–1.00)
Chloride: 114 mmol/L — ABNORMAL HIGH (ref 101–111)
GFR calc Af Amer: 27 mL/min — ABNORMAL LOW (ref >60)
GFR calc non Af Amer: 23 mL/min — ABNORMAL LOW (ref >60)
GLUCOSE: 158 mg/dL — AB (ref 65–99)
Potassium: 2.9 mmol/L — ABNORMAL LOW (ref 3.5–5.1)
Sodium: 146 mmol/L — ABNORMAL HIGH (ref 135–145)

## 2017-05-29 MED ORDER — AMLODIPINE BESYLATE 10 MG PO TABS: 10.0000 mg | ORAL_TABLET | Freq: Every day | ORAL | 0 refills | Status: DC

## 2017-05-29 MED ORDER — DOCUSATE SODIUM 100 MG PO CAPS: 200.0000 mg | ORAL_CAPSULE | Freq: Two times a day (BID) | ORAL | 0 refills | Status: DC

## 2017-05-29 MED ORDER — METRONIDAZOLE IN NACL 5-0.79 MG/ML-% IV SOLN
500.0000 mg | Freq: Once | INTRAVENOUS | Status: AC
  Administered 2017-05-29: 500 mg via INTRAVENOUS
  Filled 2017-05-29: qty 100

## 2017-05-29 MED ORDER — CIPROFLOXACIN HCL 250 MG PO TABS: 250.0000 mg | ORAL_TABLET | Freq: Two times a day (BID) | ORAL | 0 refills | Status: DC

## 2017-05-29 MED ORDER — BISACODYL 5 MG PO TBEC: 10.0000 mg | DELAYED_RELEASE_TABLET | Freq: Every day | ORAL | 0 refills | Status: DC

## 2017-05-29 MED ORDER — METRONIDAZOLE 500 MG PO TABS: 500.0000 mg | ORAL_TABLET | Freq: Three times a day (TID) | ORAL | 0 refills | Status: DC

## 2017-05-29 MED ORDER — POTASSIUM CHLORIDE 10 MEQ/100ML IV SOLN
10.0000 meq | INTRAVENOUS | Status: AC
  Administered 2017-05-29: 10 meq via INTRAVENOUS
  Filled 2017-05-29 (×4): qty 100

## 2017-05-29 MED ORDER — POTASSIUM CHLORIDE 20 MEQ/15ML (10%) PO SOLN
40.0000 meq | Freq: Two times a day (BID) | ORAL | Status: AC
  Administered 2017-05-29 (×2): 40 meq via ORAL
  Filled 2017-05-29 (×3): qty 30

## 2017-05-29 MED ORDER — HYDRALAZINE HCL 50 MG PO TABS: 50.0000 mg | ORAL_TABLET | Freq: Three times a day (TID) | ORAL | 0 refills | Status: DC

## 2017-05-29 MED ORDER — LACTULOSE 10 GM/15ML PO SOLN: 20.0000 g | Freq: Every day | ORAL | 0 refills | Status: DC

## 2017-05-29 NOTE — Progress Notes (Signed)
Spoke wit the nurse at [facility] concerning patient coming back to facility today. Report was given to [facility].  Pt ready for discharge.

## 2017-05-29 NOTE — Discharge Instructions (Signed)
MAKE SURE SHE TAKES BOWEL SOFTNERS .SHE NEEDS TO HAVE A BM DAILY TO AVOID FURTHER EVENTS. CHECK BMP TOMORROW AND MAKE SURE HER POTTASIUM IS NORMAL.

## 2017-05-29 NOTE — NC FL2 (Signed)
[place] MEDICAID FL2 LEVEL OF CARE SCREENING TOOL     IDENTIFICATION  Patient Name: Allison Rodgers Birthdate: 1924/04/28 Sex: female Admission Date (Current Location): 05/24/2017  [facility] and [place] Number:  Herbalist and Address:  [facility],  [address] [place], [place]      Provider Number: [id number]  Attending Physician Name and Address:  Georgette Shell, MD  Relative Name and Phone Number:       Current Level of Care: Hospital Recommended Level of Care: [place] Prior Approval Number:    Date Approved/Denied:   PASRR Number:    Discharge Plan: Home (ALF)    Current Diagnoses: Patient Active Problem List   Diagnosis Date Noted  . Abdominal pain, left lower quadrant   . Colovaginal fistula   . Protein-calorie malnutrition, severe 09/22/2016  . AKI (acute kidney injury) ([place]) 09/21/2016  . Protein-calorie malnutrition ([place]) 09/21/2016  . Diverticulitis 09/18/2016  . Moderate dementia 10/31/2014  . Health care maintenance 02/18/2014  . Other malaise and fatigue 11/22/2013  . Vaginal discharge 11/22/2013  . Gastroparesis 08/23/2013  . Abdominal pain, other specified site 07/13/2013  . Chronic renal failure 05/29/2013  . Volume depletion 05/29/2013  . DM2 (diabetes mellitus, type 2) ([place]) 05/10/2013  . 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 Hyponatremia 05/10/2013  . Glaucoma 05/10/2013  . Type II or unspecified type diabetes mellitus with neurological manifestations, uncontrolled(250.62) 04/20/2013  . Hypokalemia 04/06/2013  . Candida rash of groin 04/06/2013  . Acute encephalopathy 04/02/2013  . Meningitis 04/02/2013  . Dementia 04/02/2013  . Chronic kidney disease, stage IV (severe) ([place]) 04/02/2013  . BENIGN NEOPLASM [place] DIGESTIVE SYSTEM 01/11/2008  . ESOPHAGEAL STRICTURE 01/11/2008  . GERD 10/29/2007  . Diabetes mellitus type 2, controlled ([place]) 06/24/2007  .  Morbid obesity ([place]) 06/24/2007  . HYPERTENSION 06/24/2007    Orientation RESPIRATION BLADDER Height & Weight     Self, Place  Normal Incontinent Weight: 170 lb (77.1 kg) Height:  5' (152.4 cm)  BEHAVIORAL SYMPTOMS/MOOD NEUROLOGICAL BOWEL NUTRITION STATUS      Incontinent Diet (regular diet)  AMBULATORY STATUS COMMUNICATION OF NEEDS Skin   Extensive Assist Verbally Normal                       Personal Care Assistance Level of Assistance  Bathing, Feeding, Dressing Bathing Assistance: Limited assistance Feeding assistance: Independent Dressing Assistance: Limited assistance     Functional Limitations Info  Sight, Hearing, Speech Sight Info: Adequate Hearing Info: Adequate Speech Info: Adequate    SPECIAL CARE FACTORS FREQUENCY                       Contractures Contractures Info: Not present    Additional Factors Info  Code Status, Allergies Code Status Info: full code Allergies Info: Penicillins, Sulfamethoxazole-trimethoprim, Bactrim, Trimethoprim           Current Medications (05/29/2017):  This is the current hospital active medication list Current Facility-Administered Medications  Medication Dose Route Frequency Provider Last Rate Last Dose  . 0.9 %  sodium chloride infusion   Intravenous Continuous Georgette Shell, MD   Stopped at 05/29/17 1119  . acetaminophen (TYLENOL) tablet 500 mg  500 mg Oral Q6H PRN Gennaro Africa, MD      . amLODipine (NORVASC) tablet 10 mg  10 mg Oral Daily Thurnell Lose, MD   10 mg at 05/29/17 1010  . bisacodyl (DULCOLAX)  EC tablet 10 mg  10 mg Oral Daily Thurnell Lose, MD   10 mg at 05/29/17 1000  . chlorhexidine (PERIDEX) 0.12 % solution 15 mL  15 mL Mouth Rinse BID Gennaro Africa, MD   15 mL at 05/29/17 1010  . ciprofloxacin (CIPRO) IVPB 200 mg  200 mg Intravenous Q12H Georgette Shell, MD   Stopped at 05/29/17 [phone]  . docusate sodium (COLACE) capsule 200 mg  200 mg Oral BID Thurnell Lose, MD   200 mg  at 05/29/17 1010  . famotidine (PEPCID) tablet 20 mg  20 mg Oral Daily Berton Mount, RPH   20 mg at 86/57/84 6962  . folic acid (FOLVITE) tablet 1 mg  1 mg Oral Daily Gennaro Africa, MD   1 mg at 05/29/17 1010  . heparin injection 5,000 Units  5,000 Units Subcutaneous Q12H Gennaro Africa, MD   5,000 Units at 05/29/17 1009  . hydrALAZINE (APRESOLINE) injection 10 mg  10 mg Intravenous Q4H PRN Opyd, Ilene Qua, MD   10 mg at 05/29/17 0636  . hydrALAZINE (APRESOLINE) tablet 50 mg  50 mg Oral Q8H Thurnell Lose, MD   50 mg at 05/29/17 1436  . insulin aspart (novoLOG) injection 0-9 Units  0-9 Units Subcutaneous TID WC Gennaro Africa, MD   2 Units at 05/29/17 1436  . insulin glargine (LANTUS) injection 5 Units  5 Units Subcutaneous QHS Gennaro Africa, MD   5 Units at 05/28/17 2101  . iopamidol (ISOVUE-300) 61 % injection 30 mL  30 mL Oral Once PRN Thurnell Lose, MD      . lactulose ([place]) 10 GM/15ML solution 20 g  20 g Oral Daily Georgette Shell, MD   20 g at 05/29/17 1010  . latanoprost (XALATAN) 0.005 % ophthalmic solution 1 drop  1 drop Both Eyes QHS Gennaro Africa, MD   1 drop at 05/28/17 2200  . MEDLINE mouth rinse  15 mL Mouth Rinse q12n4p Gennaro Africa, MD   15 mL at 05/29/17 1200  . metroNIDAZOLE (FLAGYL) tablet 500 mg  500 mg Oral Q8H Thurnell Lose, MD   500 mg at 05/29/17 1435  . mirtazapine (REMERON) tablet 15 mg  15 mg Oral QHS Gennaro Africa, MD   15 mg at 05/28/17 2058  . potassium chloride 20 MEQ/15ML (10%) solution 40 mEq  40 mEq Oral BID Georgette Shell, MD   40 mEq at 05/29/17 1009     Discharge Medications: acetaminophen 500 MG tablet Commonly known as:  TYLENOL Take 1 tablet (500 mg total) by mouth every 6 (six) hours as needed for moderate pain.   ALIGN 4 MG Caps Take 4 mg by mouth daily.   amLODipine 10 MG tablet Commonly known as:  NORVASC Take 1 tablet (10 mg total) by mouth daily.   bisacodyl 5 MG EC tablet Commonly known as:  DULCOLAX Take 2  tablets (10 mg total) by mouth daily.   cholecalciferol 400 units Tabs tablet Commonly known as:  VITAMIN D Take 800 Units by mouth daily.   ciprofloxacin 250 MG tablet Commonly known as:  CIPRO Take 1 tablet (250 mg total) by mouth 2 (two) times daily.   clotrimazole-betamethasone cream Commonly known as:  LOTRISONE Apply 1 application topically 2 (two) times daily.   Cranberry 500 MG Caps Take 500 mg by mouth 2 (two) times daily.   docusate sodium 100 MG capsule Commonly known as:  COLACE Take 2 capsules (200 mg total) by mouth 2 (  two) times daily.   dorzolamide 2 % ophthalmic solution Commonly known as:  TRUSOPT Place 1 drop into both eyes 3 (three) times daily.   folic acid 1 MG tablet Commonly known as:  FOLVITE Take 1 mg by mouth daily.   HUMALOG KWIKPEN 100 UNIT/ML KiwkPen Generic drug:  insulin lispro Inject 3 Units into the skin 3 (three) times daily before meals. 8a, 12p, 5:30p   hydrALAZINE 50 MG tablet Commonly known as:  APRESOLINE Take 1 tablet (50 mg total) by mouth every 8 (eight) hours.   lactulose 10 GM/15ML solution Commonly known as:  CHRONULAC Take 30 mLs (20 g total) by mouth daily.   LANTUS 100 UNIT/ML injection Generic drug:  insulin glargine Inject 8 Units into the skin at bedtime. Has been taking 8 units into skin before breakfast   metroNIDAZOLE 500 MG tablet Commonly known as:  FLAGYL Take 1 tablet (500 mg total) by mouth every 8 (eight) hours.   mirtazapine 15 MG tablet Commonly known as:  REMERON Take 15 mg by mouth at bedtime.   polyethylene glycol powder powder Commonly known as:  GLYCOLAX/MIRALAX Take 17 g by mouth 2 (two) times daily.   predniSONE 5 MG tablet Commonly known as:  DELTASONE Take 5 mg by mouth 2 (two) times daily with a meal.   sodium chloride 0.65 % Soln nasal spray Commonly known as:  OCEAN Place 2 sprays into both nostrils as needed for congestion. Reported on 02/14/2016   sorbitol 70 %  solution Take 30 mLs by mouth daily as needed (constipation).   XALATAN 0.005 % ophthalmic solution Generic drug:  latanoprost Place 1 drop into both eyes at bedtime.     Relevant Imaging Results:  Relevant Lab Results:   Additional Information SSN [id number], needs HHPT  Esvin Hnat Valeta Harms, LCSW

## 2017-05-29 NOTE — Discharge Summary (Signed)
Physician Discharge Summary  Allison Rodgers MPN:[id number] DOB: 04/03/1924 DOA: 05/24/2017  PCP: Sande Brothers, MD  Admit date: 05/24/2017 Discharge date: 05/29/2017  Admitted From: [place] Disposition:  AL  Recommendations for Outpatient Follow-up:  1. Follow up with PCP in 1-2 weeks 2. Please obtain BMP/CBC in one week  Home Health:NO Equipment/DevicesNO  Discharge Condition:STABLE CODE STATUS:DNR Diet recommendation: REG  Brief/Interim Summary:83 yo female NH resident who was brought with cc of AMS over the last few weeks along with worsening abdominal pain and decreased communication with family/staff. The patient had her most decline over the last 24 hours as she keeps sleeping and barely responding to any commands which is unusual for her despite having dementia at baseline. Otherwise no N/V/D/cough/chest pain/fever/chills per family who provided the history. She has been treated previously for diverticulitis and this admission being treated for possible mild diverticulitis with evidence of large stool burden. Was going to be discharged today however developed abdominal pain and emesis, checking CT scan of the abdomen again to rule out any obstruction. ct scan did not show any evidence of obstrution.but stools noteed.patient has not had any more vomiting,has chronic abdominal pain.    Discharge Diagnoses:  Active Problems:   Diabetes mellitus type 2, controlled ([place])   Acute encephalopathy   DM2 (diabetes mellitus, type 2) ([facility])   Diverticulitis  Chronic abdominal pain/obstipation colovescical fistula/recurrent uti ckd satge 5 Dementia Dm Diverticulitis on cipro/flagyl   Discharge Instructions PATIENT TO TAKE SCHEDULED BOWEL SOFTNERS.   Allergies as of 05/29/2017      Reactions   Penicillins Other (See Comments)   REACTION: causes hallucinations   Sulfamethoxazole-trimethoprim Swelling   REACTION: rash   Bactrim    Unknown per mar   Trimethoprim    Unknown per  mar      Medication List    STOP taking these medications   guaifenesin 100 MG/5ML syrup Commonly known as:  ROBITUSSIN   levofloxacin 250 MG tablet Commonly known as:  LEVAQUIN   loperamide 2 MG capsule Commonly known as:  IMODIUM   ranitidine 150 MG capsule Commonly known as:  ZANTAC     TAKE these medications   acetaminophen 500 MG tablet Commonly known as:  TYLENOL Take 1 tablet (500 mg total) by mouth every 6 (six) hours as needed for moderate pain.   ALIGN 4 MG Caps Take 4 mg by mouth daily.   amLODipine 10 MG tablet Commonly known as:  NORVASC Take 1 tablet (10 mg total) by mouth daily.   bisacodyl 5 MG EC tablet Commonly known as:  DULCOLAX Take 2 tablets (10 mg total) by mouth daily.   cholecalciferol 400 units Tabs tablet Commonly known as:  VITAMIN D Take 800 Units by mouth daily.   ciprofloxacin 250 MG tablet Commonly known as:  CIPRO Take 1 tablet (250 mg total) by mouth 2 (two) times daily.   clotrimazole-betamethasone cream Commonly known as:  LOTRISONE Apply 1 application topically 2 (two) times daily.   Cranberry 500 MG Caps Take 500 mg by mouth 2 (two) times daily.   docusate sodium 100 MG capsule Commonly known as:  COLACE Take 2 capsules (200 mg total) by mouth 2 (two) times daily.   dorzolamide 2 % ophthalmic solution Commonly known as:  TRUSOPT Place 1 drop into both eyes 3 (three) times daily.   folic acid 1 MG tablet Commonly known as:  FOLVITE Take 1 mg by mouth daily.   HUMALOG KWIKPEN 100 UNIT/ML KiwkPen Generic drug:  insulin lispro Inject 3 Units into the skin 3 (three) times daily before meals. 8a, 12p, 5:30p   hydrALAZINE 50 MG tablet Commonly known as:  APRESOLINE Take 1 tablet (50 mg total) by mouth every 8 (eight) hours.   lactulose 10 GM/15ML solution Commonly known as:  CHRONULAC Take 30 mLs (20 g total) by mouth daily.   LANTUS 100 UNIT/ML injection Generic drug:  insulin glargine Inject 8 Units into the  skin at bedtime. Has been taking 8 units into skin before breakfast   metroNIDAZOLE 500 MG tablet Commonly known as:  FLAGYL Take 1 tablet (500 mg total) by mouth every 8 (eight) hours.   mirtazapine 15 MG tablet Commonly known as:  REMERON Take 15 mg by mouth at bedtime.   polyethylene glycol powder powder Commonly known as:  GLYCOLAX/MIRALAX Take 17 g by mouth 2 (two) times daily.   predniSONE 5 MG tablet Commonly known as:  DELTASONE Take 5 mg by mouth 2 (two) times daily with a meal.   sodium chloride 0.65 % Soln nasal spray Commonly known as:  OCEAN Place 2 sprays into both nostrils as needed for congestion. Reported on 02/14/2016   sorbitol 70 % solution Take 30 mLs by mouth daily as needed (constipation).   XALATAN 0.005 % ophthalmic solution Generic drug:  latanoprost Place 1 drop into both eyes at bedtime.       Allergies  Allergen Reactions  . Penicillins Other (See Comments)    REACTION: causes hallucinations  . Sulfamethoxazole-Trimethoprim Swelling    REACTION: rash  . Bactrim     Unknown per mar  . Trimethoprim     Unknown per mar     Consultations:     Procedures/Studies: Ct Abdomen Pelvis Wo Contrast  Result Date: 05/27/2017 CLINICAL DATA:  Abdominal pain and distension EXAM: CT ABDOMEN AND PELVIS WITHOUT CONTRAST TECHNIQUE: Multidetector CT imaging of the abdomen and pelvis was performed following the standard protocol without IV contrast. COMPARISON:  05/24/2017 FINDINGS: Lower chest: Small bilateral pleural effusions are noted with mild left basilar atelectasis. This is new from the prior exam. Hepatobiliary: No focal liver abnormality is seen. Status post cholecystectomy. No biliary dilatation. Pancreas: Unremarkable. No pancreatic ductal dilatation or surrounding inflammatory changes. Spleen: Normal in size without focal abnormality. Adrenals/Urinary Tract: The adrenal glands are within normal limits. A hypodensity is noted within the left  kidney consistent with cystic lesions stable from the prior exam. No renal calculi or urinary tract obstructive changes are seen. The bladder remains well distended. Stomach/Bowel: Diverticular changes again seen throughout the colon with diffuse wall thickening. There remain some mild prominence of the colon proximal to this similar to that noted on the prior exam. No other obstructive or inflammatory changes are noted. The appendix has been surgically removed. Vascular/Lymphatic: Aortic atherosclerosis. No enlarged abdominal or pelvic lymph nodes. Reproductive: Status post hysterectomy. No adnexal masses. Other: No abdominal wall hernia or abnormality. No abdominopelvic ascites. Musculoskeletal: Degenerative changes of lumbar spine are noted. Chronic anterolisthesis of L4 on L5 is noted. IMPRESSION: Stable appearing changes of diverticulosis and smooth muscle wall thickening in the sigmoid colon. Relative dilatation of the colon with retention of fecal material is noted proximally this similar to that seen on the prior exam. Postoperative and chronic changes similar to that seen on the prior exam. No new focal abnormality is noted. Electronically Signed   By: Inez Catalina M.D.   On: 05/27/2017 13:36   Ct Abdomen Pelvis Wo Contrast  Result Date: 05/24/2017  CLINICAL DATA:  Abdominal pain. EXAM: CT ABDOMEN AND PELVIS WITHOUT CONTRAST TECHNIQUE: Multidetector CT imaging of the abdomen and pelvis was performed following the standard protocol without IV contrast. COMPARISON:  CT abdomen and pelvis dated October 24, 2016. FINDINGS: Lower chest: Subsegmental atelectasis.  No acute abnormality. Hepatobiliary: No focal liver abnormality is seen. Status post cholecystectomy. No biliary dilatation. Pancreas: Atrophic. No ductal dilatation or surrounding inflammatory changes. Spleen: Normal in size without focal abnormality. Adrenals/Urinary Tract: The adrenal glands are unremarkable. Stable 1.5 cm cyst in the left upper  pole. No renal calculi or hydronephrosis. The bladder is unremarkable. Stomach/Bowel: The stomach is within normal limits. No bowel obstruction. Left-sided diverticulosis with unchanged wall thickening of a relatively long segment of the sigmoid colon with mild surrounding inflammatory changes. Large amount of stool within the remaining colon. Vascular/Lymphatic: Aortic atherosclerosis. No enlarged abdominal or pelvic lymph nodes. Reproductive: Status post hysterectomy. No adnexal masses. Other: No free fluid or pneumoperitoneum. Musculoskeletal: No acute or significant osseous findings. Degenerative changes of the thoracolumbar spine, including 8 mm anterolisthesis at L4-L5, are unchanged. IMPRESSION: 1. Unchanged wall thickening and inflammatory changes surrounding a relatively long segment of sigmoid colon. Findings could reflect recurrent diverticulitis, however given the similar appearance and location to the prior study, underlying colon cancer cannot be entirely excluded. Correlate with recent colonoscopy. 2. Large amount of stool within the remaining colon proximal to the area of sigmoid wall thickening. Correlate for constipation. 3.  Aortic atherosclerosis (ICD10-I70.0). Electronically Signed   By: Titus Dubin M.D.   On: 05/24/2017 19:36   Dg Chest 2 View  Result Date: 05/24/2017 CLINICAL DATA:  Recent fall.  Weakness. EXAM: CHEST  2 VIEW COMPARISON:  09/29/2016 FINDINGS: Cardiomediastinal silhouette is normal. Mediastinal contours appear intact. Calcific atherosclerotic disease and tortuosity of the aorta. Stable eventration of the left hemidiaphragm. Streaky peribronchial opacities in the left lower lobe. Osseous structures are without acute abnormality. Soft tissues are grossly normal. IMPRESSION: Streaky peribronchial opacities in the left lower lobe may represent atelectasis versus airspace consolidation. No displaced fractures are seen. Electronically Signed   By: Fidela Salisbury M.D.    On: 05/24/2017 19:38   Ct Head Wo Contrast  Result Date: 05/24/2017 CLINICAL DATA:  "Pt BIB [facility] from [facility]. EMS reports that pt was recently dx'd with a UTI and has been on antibiotics for 3 of her 7 day prescription. She requested transfer today because she/family feels like she is not improving. Potential fall. EXAM: CT HEAD WITHOUT CONTRAST CT CERVICAL SPINE WITHOUT CONTRAST TECHNIQUE: Multidetector CT imaging of the head and cervical spine was performed following the standard protocol without intravenous contrast. Multiplanar CT image reconstructions of the cervical spine were also generated. COMPARISON:  Head CT 09/29/2016 FINDINGS: CT HEAD FINDINGS Brain: No intracranial hemorrhage. No parenchymal contusion. No midline shift or mass effect. Basilar cisterns are patent. No skull base fracture. No fluid in the paranasal sinuses or mastoid air cells. Orbits are normal. There are periventricular and subcortical white matter hypodensities. Generalized cortical atrophy. Vascular: No hyperdense vessel or unexpected calcification. Skull: Normal. Negative for fracture or focal lesion. Sinuses/Orbits: Paranasal sinuses and mastoid air cells are clear. Orbits are clear. Other: None. CT CERVICAL SPINE FINDINGS Alignment: Normal alignment of the cervical vertebral bodies. Skull base and vertebrae: Normal craniocervical junction. No loss of bowel vertebral body height or disc height. Normal facet articulation. No evidence of fracture. Soft tissues and spinal canal: No prevertebral soft tissue swelling. No perispinal or epidural hematoma. Disc  levels:  Multilevel disc osteophytic disease. Upper chest: Clear Other: None IMPRESSION: 1. No intracranial trauma. 2. Atrophy and white matter microvascular disease. 3. No cervical spine fracture. Electronically Signed   By: Suzy Bouchard M.D.   On: 05/24/2017 17:16   Ct Cervical Spine Wo Contrast  Result Date: 05/24/2017 CLINICAL DATA:  "Pt BIB [facility] from  [facility]. EMS reports that pt was recently dx'd with a UTI and has been on antibiotics for 3 of her 7 day prescription. She requested transfer today because she/family feels like she is not improving. Potential fall. EXAM: CT HEAD WITHOUT CONTRAST CT CERVICAL SPINE WITHOUT CONTRAST TECHNIQUE: Multidetector CT imaging of the head and cervical spine was performed following the standard protocol without intravenous contrast. Multiplanar CT image reconstructions of the cervical spine were also generated. COMPARISON:  Head CT 09/29/2016 FINDINGS: CT HEAD FINDINGS Brain: No intracranial hemorrhage. No parenchymal contusion. No midline shift or mass effect. Basilar cisterns are patent. No skull base fracture. No fluid in the paranasal sinuses or mastoid air cells. Orbits are normal. There are periventricular and subcortical white matter hypodensities. Generalized cortical atrophy. Vascular: No hyperdense vessel or unexpected calcification. Skull: Normal. Negative for fracture or focal lesion. Sinuses/Orbits: Paranasal sinuses and mastoid air cells are clear. Orbits are clear. Other: None. CT CERVICAL SPINE FINDINGS Alignment: Normal alignment of the cervical vertebral bodies. Skull base and vertebrae: Normal craniocervical junction. No loss of bowel vertebral body height or disc height. Normal facet articulation. No evidence of fracture. Soft tissues and spinal canal: No prevertebral soft tissue swelling. No perispinal or epidural hematoma. Disc levels:  Multilevel disc osteophytic disease. Upper chest: Clear Other: None IMPRESSION: 1. No intracranial trauma. 2. Atrophy and white matter microvascular disease. 3. No cervical spine fracture. Electronically Signed   By: Suzy Bouchard M.D.   On: 05/24/2017 17:16   Dg Abd 2 Views  Result Date: 05/27/2017 CLINICAL DATA:  Nausea, history of diverticulitis, gastritis, hiatal hernia, diabetes mellitus, GERD EXAM: ABDOMEN - 2 VIEW COMPARISON:  05/26/2017 FINDINGS:  Minimal gas distention of transverse colon. Normal in caliber scattered air-filled loops of large and small bowel throughout remainder of abdomen. No evidence of bowel obstruction or bowel wall thickening. No free intraperitoneal air. Mild atelectasis versus consolidation in LEFT lower lobe. Surgical clips RIGHT upper quadrant likely reflect prior cholecystectomy. Osseous demineralization with degenerative disc and facet disease changes of the thoracolumbar spine. Scattered pelvic phleboliths without definite urinary tract calcification. IMPRESSION: Nonspecific bowel gas pattern. Question atelectasis versus consolidation LEFT lower lobe. Electronically Signed   By: Lavonia Dana M.D.   On: 05/27/2017 09:08   Dg Abd Portable 1v  Result Date: 05/26/2017 CLINICAL DATA:  Constipation EXAM: PORTABLE ABDOMEN - 1 VIEW COMPARISON:  CT abdomen and pelvis May 24, 2017 FINDINGS: There remains mild dilatation of the sigmoid colon. Wall does not appear significantly thickened by radiography in this area. Elsewhere, there is moderate stool throughout colon. There is no appreciable small bowel dilatation. No air-fluid level. No free air evident. There is a surgical clip in the right mid abdomen. IMPRESSION: Mild dilatation of the sigmoid colon region without significant wall thickening by radiography. No frank bowel obstruction. No free air. Moderate stool in colon. Electronically Signed   By: Lowella Grip III M.D.   On: 05/26/2017 08:13    (Echo, Carotid, EGD, Colonoscopy, ERCP)    Subjective:   Discharge Exam: Vitals:   05/28/17 2157 05/29/17 0442  BP: (!) 155/74 (!) 188/80  Pulse: 98 (!)  102  Resp: 18 17  Temp: 99.1 F (37.3 C) 98.5 F (36.9 C)  SpO2: 95% 100%   Vitals:   05/28/17 0415 05/28/17 1347 05/28/17 2157 05/29/17 0442  BP: (!) 147/83 (!) 172/70 (!) 155/74 (!) 188/80  Pulse: 94 95 98 (!) 102  Resp: 20 18 18 17   Temp: 98.6 F (37 C) 99 F (37.2 C) 99.1 F (37.3 C) 98.5 F (36.9 C)   TempSrc: Oral Axillary Oral Oral  SpO2: 99% 96% 95% 100%  Weight:      Height:        General: Pt is alert, awake, not in acute distress Cardiovascular: RRR, S1/S2 +, no rubs, no gallops Respiratory: CTA bilaterally, no wheezing, no rhonchi Abdominal: Soft, NT, ND, bowel sounds + Extremities: no edema, no cyanosis    The results of significant diagnostics from this hospitalization (including imaging, microbiology, ancillary and laboratory) are listed below for reference.     Microbiology: Recent Results (from the past 240 hour(s))  MRSA PCR Screening     Status: None   Collection Time: 05/25/17 12:25 AM  Result Value Ref Range Status   MRSA by PCR NEGATIVE NEGATIVE Final    Comment:        The GeneXpert MRSA Assay (FDA approved for NASAL specimens only), is one component of a comprehensive MRSA colonization surveillance program. It is not intended to diagnose MRSA infection nor to guide or monitor treatment for MRSA infections.      Labs: BNP (last 3 results) No results for input(s): BNP in the last 8760 hours. Basic Metabolic Panel:  Recent Labs Lab 05/25/17 0047 05/26/17 0858 05/27/17 0357 05/28/17 0349 05/29/17 0401  NA 143 142 142 142 146*  K 3.7 3.3* 3.5 3.2* 2.9*  CL 111 108 112* 110 114*  CO2 26 24 24  21* 23  GLUCOSE 99 145* 123* 172* 158*  BUN 22* 19 17 18  28*  CREATININE 1.81* 1.74* 1.62* 1.64* 1.80*  CALCIUM 9.1 8.8* 8.7* 8.7* 8.8*  MG  --   --  1.7  --   --    Liver Function Tests:  Recent Labs Lab 05/24/17 1630 05/25/17 0047  AST 38 23  ALT 15 14  ALKPHOS 86 84  BILITOT 0.7 0.6  PROT 7.1 6.8  ALBUMIN 3.2* 3.1*   No results for input(s): LIPASE, AMYLASE in the last 168 hours.  Recent Labs Lab 05/24/17 1632  AMMONIA 20   CBC:  Recent Labs Lab 05/24/17 1524 05/25/17 0047 05/26/17 0858 05/28/17 0349  WBC 7.7 5.1 5.2 7.9  NEUTROABS 5.3  --   --   --   HGB 11.1* 12.0 11.8* 12.5  HCT 36.1 37.8 38.1 38.8  MCV 76.5* 76.1*  75.7* 74.8*  PLT 172 141* 142* 172   Cardiac Enzymes:  Recent Labs Lab 05/25/17 0047  TROPONINI <0.03   BNP: Invalid input(s): POCBNP CBG:  Recent Labs Lab 05/28/17 1206 05/28/17 1746 05/28/17 2238 05/29/17 0740 05/29/17 1226  GLUCAP 177* 168* 157* 170* 195*   D-Dimer No results for input(s): DDIMER in the last 72 hours. Hgb A1c No results for input(s): HGBA1C in the last 72 hours. Lipid Profile No results for input(s): CHOL, HDL, LDLCALC, TRIG, CHOLHDL, LDLDIRECT in the last 72 hours. Thyroid function studies No results for input(s): TSH, T4TOTAL, T3FREE, THYROIDAB in the last 72 hours.  Invalid input(s): FREET3 Anemia work up No results for input(s): VITAMINB12, FOLATE, FERRITIN, TIBC, IRON, RETICCTPCT in the last 72 hours. Urinalysis  Component Value Date/Time   COLORURINE YELLOW 05/24/2017 1741   APPEARANCEUR CLEAR 05/24/2017 1741   LABSPEC 1.015 05/24/2017 1741   PHURINE 5.0 05/24/2017 1741   GLUCOSEU NEGATIVE 05/24/2017 1741   GLUCOSEU NEGATIVE 11/15/2013 1134   HGBUR SMALL (A) 05/24/2017 1741   BILIRUBINUR NEGATIVE 05/24/2017 1741   KETONESUR 5 (A) 05/24/2017 1741   PROTEINUR >=300 (A) 05/24/2017 1741   UROBILINOGEN 0.2 11/15/2013 1134   NITRITE NEGATIVE 05/24/2017 1741   LEUKOCYTESUR NEGATIVE 05/24/2017 1741   Sepsis Labs Invalid input(s): PROCALCITONIN,  WBC,  LACTICIDVEN Microbiology Recent Results (from the past 240 hour(s))  MRSA PCR Screening     Status: None   Collection Time: 05/25/17 12:25 AM  Result Value Ref Range Status   MRSA by PCR NEGATIVE NEGATIVE Final    Comment:        The GeneXpert MRSA Assay (FDA approved for NASAL specimens only), is one component of a comprehensive MRSA colonization surveillance program. It is not intended to diagnose MRSA infection nor to guide or monitor treatment for MRSA infections.      Time coordinating discharge: Over 30 minutes  SIGNED:   Georgette Shell, MD  [facility] 05/29/2017, 2:29 PM  If 7PM-7AM, please contact night-coverage [email or web address] Password [email or web address]

## 2017-05-29 NOTE — Progress Notes (Signed)
Pt returning to her [facility] memory care at [place]. Spoke with pt and family this morning. Son to set up [facility] per his request. Son to transport pt upon DC. Spoke with Franne Forts at [facility]- advised when DC information available, provide to facility for review prior to having pt leave hospital.  Will follow and complete FL2 for facility once discharge information complete later today.   Sharren Bridge, MSW, LCSW Clinical Social Work 05/29/2017 [phone]

## 2017-05-29 NOTE — Progress Notes (Signed)
Pt returning to [facility] at [place] today. Son transporting. RN report [phone].  CSW provided pt's information to facility via the [place] and confirmed with Jeanie Sewer at [facility] documents where received.   No barriers to DC.   Sharren Bridge, MSW, LCSW Clinical Social Work 05/29/2017 [phone]

## 2017-05-30 ENCOUNTER — Encounter (HOSPITAL_COMMUNITY): Payer: Self-pay | Admitting: Internal Medicine

## 2017-05-30 ENCOUNTER — Inpatient Hospital Stay (HOSPITAL_COMMUNITY)
Admission: EM | Admit: 2017-05-30 | Discharge: 2017-06-04 | DRG: 388 | Disposition: A | Discharge status: Skilled Nursing Facility | Payer: Medicare Other | Attending: Internal Medicine | Admitting: Internal Medicine

## 2017-05-30 ENCOUNTER — Emergency Department (HOSPITAL_COMMUNITY): Payer: Medicare Other

## 2017-05-30 DIAGNOSIS — E870 Hyperosmolality and hypernatremia: Secondary | ICD-10-CM

## 2017-05-30 DIAGNOSIS — I129 Hypertensive chronic kidney disease with stage 1 through stage 4 chronic kidney disease, or unspecified chronic kidney disease: Secondary | ICD-10-CM | POA: Diagnosis present

## 2017-05-30 DIAGNOSIS — N179 Acute kidney failure, unspecified: Secondary | ICD-10-CM | POA: Diagnosis present

## 2017-05-30 DIAGNOSIS — Z882 Allergy status to sulfonamides status: Secondary | ICD-10-CM | POA: Diagnosis not present

## 2017-05-30 DIAGNOSIS — Z794 Long term (current) use of insulin: Secondary | ICD-10-CM | POA: Diagnosis not present

## 2017-05-30 DIAGNOSIS — Z888 Allergy status to other drugs, medicaments and biological substances status: Secondary | ICD-10-CM | POA: Diagnosis not present

## 2017-05-30 DIAGNOSIS — E440 Moderate protein-calorie malnutrition: Secondary | ICD-10-CM | POA: Diagnosis present

## 2017-05-30 DIAGNOSIS — F0150 Vascular dementia without behavioral disturbance: Secondary | ICD-10-CM | POA: Diagnosis not present

## 2017-05-30 DIAGNOSIS — E860 Dehydration: Secondary | ICD-10-CM | POA: Diagnosis present

## 2017-05-30 DIAGNOSIS — E669 Obesity, unspecified: Secondary | ICD-10-CM | POA: Diagnosis present

## 2017-05-30 DIAGNOSIS — Z66 Do not resuscitate: Secondary | ICD-10-CM | POA: Diagnosis present

## 2017-05-30 DIAGNOSIS — Z880 Allergy status to penicillin: Secondary | ICD-10-CM

## 2017-05-30 DIAGNOSIS — J189 Pneumonia, unspecified organism: Secondary | ICD-10-CM

## 2017-05-30 DIAGNOSIS — K219 Gastro-esophageal reflux disease without esophagitis: Secondary | ICD-10-CM | POA: Diagnosis present

## 2017-05-30 DIAGNOSIS — Z6832 Body mass index (BMI) 32.0-32.9, adult: Secondary | ICD-10-CM

## 2017-05-30 DIAGNOSIS — Z79899 Other long term (current) drug therapy: Secondary | ICD-10-CM | POA: Diagnosis not present

## 2017-05-30 DIAGNOSIS — F0391 Unspecified dementia with behavioral disturbance: Secondary | ICD-10-CM | POA: Diagnosis present

## 2017-05-30 DIAGNOSIS — E1122 Type 2 diabetes mellitus with diabetic chronic kidney disease: Secondary | ICD-10-CM | POA: Diagnosis present

## 2017-05-30 DIAGNOSIS — Z881 Allergy status to other antibiotic agents status: Secondary | ICD-10-CM

## 2017-05-30 DIAGNOSIS — Z515 Encounter for palliative care: Secondary | ICD-10-CM | POA: Diagnosis present

## 2017-05-30 DIAGNOSIS — F0390 Unspecified dementia without behavioral disturbance: Secondary | ICD-10-CM | POA: Diagnosis present

## 2017-05-30 DIAGNOSIS — R0989 Other specified symptoms and signs involving the circulatory and respiratory systems: Secondary | ICD-10-CM | POA: Diagnosis not present

## 2017-05-30 DIAGNOSIS — D638 Anemia in other chronic diseases classified elsewhere: Secondary | ICD-10-CM | POA: Diagnosis present

## 2017-05-30 DIAGNOSIS — J181 Lobar pneumonia, unspecified organism: Secondary | ICD-10-CM | POA: Diagnosis present

## 2017-05-30 DIAGNOSIS — K567 Ileus, unspecified: Secondary | ICD-10-CM | POA: Diagnosis present

## 2017-05-30 DIAGNOSIS — R0602 Shortness of breath: Secondary | ICD-10-CM | POA: Diagnosis present

## 2017-05-30 DIAGNOSIS — R0902 Hypoxemia: Secondary | ICD-10-CM

## 2017-05-30 DIAGNOSIS — E785 Hyperlipidemia, unspecified: Secondary | ICD-10-CM | POA: Diagnosis present

## 2017-05-30 DIAGNOSIS — E46 Unspecified protein-calorie malnutrition: Secondary | ICD-10-CM | POA: Diagnosis present

## 2017-05-30 DIAGNOSIS — Y95 Nosocomial condition: Secondary | ICD-10-CM | POA: Diagnosis present

## 2017-05-30 DIAGNOSIS — Z0189 Encounter for other specified special examinations: Secondary | ICD-10-CM

## 2017-05-30 DIAGNOSIS — N184 Chronic kidney disease, stage 4 (severe): Secondary | ICD-10-CM | POA: Diagnosis present

## 2017-05-30 DIAGNOSIS — E119 Type 2 diabetes mellitus without complications: Secondary | ICD-10-CM

## 2017-05-30 DIAGNOSIS — R52 Pain, unspecified: Secondary | ICD-10-CM

## 2017-05-30 LAB — URINALYSIS, ROUTINE W REFLEX MICROSCOPIC
Bilirubin Urine: NEGATIVE
GLUCOSE, UA: NEGATIVE mg/dL
Ketones, ur: NEGATIVE mg/dL
Nitrite: NEGATIVE
SPECIFIC GRAVITY, URINE: 1.024 (ref 1.005–1.030)
pH: 5 (ref 5.0–8.0)

## 2017-05-30 LAB — CBC WITH DIFFERENTIAL/PLATELET
BASOS ABS: 0 10*3/uL (ref 0.0–0.1)
BASOS PCT: 0 %
EOS PCT: 0 %
Eosinophils Absolute: 0 10*3/uL (ref 0.0–0.7)
HCT: 35.9 % — ABNORMAL LOW (ref 36.0–46.0)
Hemoglobin: 11.2 g/dL — ABNORMAL LOW (ref 12.0–15.0)
Lymphocytes Relative: 8 %
Lymphs Abs: 1.1 10*3/uL (ref 0.7–4.0)
MCH: 23.8 pg — ABNORMAL LOW (ref 26.0–34.0)
MCHC: 31.2 g/dL (ref 30.0–36.0)
MCV: 76.2 fL — ABNORMAL LOW (ref 78.0–100.0)
MONO ABS: 0.4 10*3/uL (ref 0.1–1.0)
MONOS PCT: 3 %
Neutro Abs: 12.2 10*3/uL — ABNORMAL HIGH (ref 1.7–7.7)
Neutrophils Relative %: 89 %
PLATELETS: 207 10*3/uL (ref 150–400)
RBC: 4.71 MIL/uL (ref 3.87–5.11)
RDW: 15.3 % (ref 11.5–15.5)
WBC: 13.8 10*3/uL — ABNORMAL HIGH (ref 4.0–10.5)

## 2017-05-30 LAB — COMPREHENSIVE METABOLIC PANEL
ALBUMIN: 3.3 g/dL — AB (ref 3.5–5.0)
ALK PHOS: 65 U/L (ref 38–126)
ALT: 17 U/L (ref 14–54)
AST: 19 U/L (ref 15–41)
Anion gap: 10 (ref 5–15)
BILIRUBIN TOTAL: 0.8 mg/dL (ref 0.3–1.2)
BUN: 56 mg/dL — ABNORMAL HIGH (ref 6–20)
CALCIUM: 9.2 mg/dL (ref 8.9–10.3)
CO2: 24 mmol/L (ref 22–32)
CREATININE: 2.39 mg/dL — AB (ref 0.44–1.00)
Chloride: 113 mmol/L — ABNORMAL HIGH (ref 101–111)
GFR calc non Af Amer: 17 mL/min — ABNORMAL LOW (ref >60)
GFR, EST AFRICAN AMERICAN: 19 mL/min — AB (ref >60)
GLUCOSE: 223 mg/dL — AB (ref 65–99)
Potassium: 3.7 mmol/L (ref 3.5–5.1)
SODIUM: 147 mmol/L — AB (ref 135–145)
TOTAL PROTEIN: 6.9 g/dL (ref 6.5–8.1)

## 2017-05-30 LAB — GLUCOSE, CAPILLARY: Glucose-Capillary: 246 mg/dL — ABNORMAL HIGH (ref 65–99)

## 2017-05-30 LAB — TROPONIN I: TROPONIN I: 0.04 ng/mL — AB (ref <0.03)

## 2017-05-30 LAB — AMMONIA: Ammonia: 17 umol/L (ref 9–35)

## 2017-05-30 LAB — LIPASE, BLOOD: Lipase: 16 U/L (ref 11–51)

## 2017-05-30 MED ORDER — SODIUM CHLORIDE 0.9 % IV BOLUS (SEPSIS)
1000.0000 mL | Freq: Once | INTRAVENOUS | Status: AC
  Administered 2017-05-30: 1000 mL via INTRAVENOUS

## 2017-05-30 MED ORDER — AZTREONAM 2 G IJ SOLR: 2.0000 g | Freq: Once | INTRAMUSCULAR | Status: DC

## 2017-05-30 MED ORDER — VANCOMYCIN HCL IN DEXTROSE 1-5 GM/200ML-% IV SOLN: 1000.0000 mg | Freq: Once | INTRAVENOUS | Status: DC

## 2017-05-30 MED ORDER — INSULIN GLARGINE 100 UNIT/ML ~~LOC~~ SOLN
8.0000 [IU] | Freq: Every day | SUBCUTANEOUS | Status: DC
  Administered 2017-05-30: 8 [IU] via SUBCUTANEOUS
  Filled 2017-05-30 (×2): qty 0.08

## 2017-05-30 MED ORDER — AMLODIPINE BESYLATE 10 MG PO TABS
10.0000 mg | ORAL_TABLET | Freq: Every day | ORAL | Status: DC
  Filled 2017-05-30: qty 1

## 2017-05-30 MED ORDER — ONDANSETRON HCL 4 MG PO TABS: 4.0000 mg | ORAL_TABLET | Freq: Four times a day (QID) | ORAL | Status: DC | PRN

## 2017-05-30 MED ORDER — VANCOMYCIN HCL IN DEXTROSE 1-5 GM/200ML-% IV SOLN: 1000.0000 mg | INTRAVENOUS | Status: DC

## 2017-05-30 MED ORDER — BISACODYL 10 MG RE SUPP: 10.0000 mg | Freq: Every day | RECTAL | Status: DC | PRN

## 2017-05-30 MED ORDER — DEXTROSE 5 % IV SOLN
INTRAVENOUS | Status: AC
  Administered 2017-05-30: 19:00:00 via INTRAVENOUS

## 2017-05-30 MED ORDER — HYDRALAZINE HCL 50 MG PO TABS
50.0000 mg | ORAL_TABLET | Freq: Three times a day (TID) | ORAL | Status: DC
  Administered 2017-05-30: 50 mg via ORAL
  Filled 2017-05-30 (×2): qty 1

## 2017-05-30 MED ORDER — DEXTROSE 5 % IV SOLN
1.0000 g | INTRAVENOUS | Status: DC
  Administered 2017-05-31 – 2017-06-03 (×4): 1 g via INTRAVENOUS
  Filled 2017-05-30 (×5): qty 1

## 2017-05-30 MED ORDER — ENOXAPARIN SODIUM 30 MG/0.3ML ~~LOC~~ SOLN
30.0000 mg | SUBCUTANEOUS | Status: DC
  Administered 2017-05-30 – 2017-06-01 (×3): 30 mg via SUBCUTANEOUS
  Filled 2017-05-30 (×3): qty 0.3

## 2017-05-30 MED ORDER — ONDANSETRON HCL 4 MG/2ML IJ SOLN: 4.0000 mg | Freq: Four times a day (QID) | INTRAMUSCULAR | Status: DC | PRN

## 2017-05-30 MED ORDER — DEXTROSE 5 % IV SOLN
2.0000 g | Freq: Once | INTRAVENOUS | Status: AC
  Administered 2017-05-30: 2 g via INTRAVENOUS
  Filled 2017-05-30: qty 2

## 2017-05-30 MED ORDER — VANCOMYCIN HCL IN DEXTROSE 1-5 GM/200ML-% IV SOLN
1000.0000 mg | Freq: Once | INTRAVENOUS | Status: AC
  Administered 2017-05-30: 1000 mg via INTRAVENOUS
  Filled 2017-05-30: qty 200

## 2017-05-30 NOTE — Progress Notes (Signed)
Pharmacy Antibiotic Note  Allison Rodgers is a 83 y.o. female admitted on 05/30/2017 with HCAP.  Pharmacy has been consulted for Vancomycin and Cefepime dosing.  Plan:  Vancomycin 1000 mg IV q48 hr; goal trough 15-20 mcg/mL  Measure vancomycin trough levels at steady state as indicated  F/u SCr; may need to adjust vanc before next dose due  Cefepime 1g IV q24 hr  PCN intolerance listed as "hallucinations," but has tolerated Rocephin. MD is concerned about worsening AMS given patient's current state - f/u SCr closely and monitor for worsening confusion while on beta-lactam tx.   Height: 5' (152.4 cm) IBW/kg (Calculated) : 45.5  Temp (24hrs), Avg:98.1 F (36.7 C), Min:97.5 F (36.4 C), Max:99.2 F (37.3 C)   Recent Labs Lab 05/24/17 1524  05/24/17 1736 05/25/17 0047 05/26/17 0858 05/27/17 0357 05/28/17 0349 05/29/17 0401 05/30/17 0918  WBC 7.7  --   --  5.1 5.2  --  7.9  --  13.8*  CREATININE  --   < >  --  1.81* 1.74* 1.62* 1.64* 1.80* 2.39*  LATICACIDVEN  --   --  1.09  --   --   --   --   --   --   < > = values in this interval not displayed.  Estimated Creatinine Clearance: 14.1 mL/min (A) (by C-G formula based on SCr of 2.39 mg/dL (H)).    Allergies  Allergen Reactions  . Penicillins Other (See Comments)    REACTION: causes hallucinations  . Sulfamethoxazole-Trimethoprim Swelling    REACTION: rash  . Bactrim     Unknown per mar  . Trimethoprim     Unknown per mar      Thank you for allowing pharmacy to be a part of this patient's care.  Reuel Boom, PharmD, BCPS Pager: [phone] 05/30/2017, 7:31 PM

## 2017-05-30 NOTE — ED Notes (Signed)
Patient transported to Ultrasound 

## 2017-05-30 NOTE — ED Notes (Signed)
Critical lab value: Troponin Value: 0.04 Dr. Gilford Raid made aware. No new orders at this time.

## 2017-05-30 NOTE — ED Notes (Signed)
Patient denies pain and is resting comfortably.  

## 2017-05-30 NOTE — ED Notes (Signed)
Patient transported to X-ray 

## 2017-05-30 NOTE — ED Provider Notes (Signed)
[facility] DEPT Provider Note   CSN: [id number] Arrival date & time: 05/30/17  0815     History   Chief Complaint Chief Complaint  Patient presents with  . Shortness of Breath  . Abdominal Pain    HPI Allison Rodgers is a 83 y.o. female.  Pt presents to the ED today with sob and abdominal pain.  Pt was discharged yesterday from [facility].  She was admitted on 10/13 for change in MS.  The pt is a NH resident.  They found patient with an O2 sat in the 80s.  Pt has dementia and is a very poor historian.  Pt had a CT abd/pelvis on 10/16 which showed:  Stable appearing changes of diverticulosis and smooth muscle wall thickening in the sigmoid colon. Relative dilatation of the colon with retention of fecal material is noted proximally this similar to that seen on the prior exam.  Postoperative and chronic changes similar to that seen on the prior exam. No new focal abnormality is noted.  Pt's family now here.  They said pt has not had any solid food since admission on the 13th.  She has not been wanting to drink.         Past Medical History:  Diagnosis Date  . Arthritis   . Bacterial meningitis   . 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 Chronic abdominal pain   . Dementia   . Diverticulosis   . DJD (degenerative joint disease)   . DM (diabetes mellitus) ([place])   . Dyslipidemia   . Edema   . Esophageal stricture   . Gastritis, chronic   . GERD (gastroesophageal reflux disease)   . Hiatal hernia   . Hx of adenomatous colonic polyps   . Hypertension   . Hypertensive cardiovascular disease   . Microcytic anemia   . Obesity   . Renal disorder     Patient Active Problem List   Diagnosis Date Noted  . Abdominal pain, left lower quadrant   . Colovaginal fistula   . Protein-calorie malnutrition, severe 09/22/2016  . AKI (acute kidney injury) ([place]) 09/21/2016  . Protein-calorie malnutrition ([place])  09/21/2016  . Diverticulitis 09/18/2016  . Moderate dementia 10/31/2014  . Health care maintenance 02/18/2014  . Other malaise and fatigue 11/22/2013  . Vaginal discharge 11/22/2013  . Gastroparesis 08/23/2013  . Abdominal pain, other specified site 07/13/2013  . Chronic renal failure 05/29/2013  . Volume depletion 05/29/2013  . DM2 (diabetes mellitus, type 2) ([place]) 05/10/2013  . 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 Hyponatremia 05/10/2013  . Glaucoma 05/10/2013  . Type II or unspecified type diabetes mellitus with neurological manifestations, uncontrolled(250.62) 04/20/2013  . Hypokalemia 04/06/2013  . Candida rash of groin 04/06/2013  . Acute encephalopathy 04/02/2013  . Meningitis 04/02/2013  . Dementia 04/02/2013  . Chronic kidney disease, stage IV (severe) ([place]) 04/02/2013  . BENIGN NEOPLASM [place] DIGESTIVE SYSTEM 01/11/2008  . ESOPHAGEAL STRICTURE 01/11/2008  . GERD 10/29/2007  . Diabetes mellitus type 2, controlled ([place]) 06/24/2007  . Morbid obesity ([place]) 06/24/2007  . HYPERTENSION 06/24/2007    Past Surgical History:  Procedure Laterality Date  . APPENDECTOMY    . BACK SURGERY    . BILATERAL OOPHORECTOMY  1957  . CHOLECYSTECTOMY  1999  . COLONOSCOPY WITH PROPOFOL N/A 10/31/2016   Procedure: COLONOSCOPY WITH PROPOFOL;  Surgeon: Milus Banister, MD;  Location: [facility] ENDOSCOPY;  Service: Endoscopy;  Laterality: N/A;  .  HERNIA REPAIR  03/1828   umbilical and ventral hernia repair by Dr. Margot Chimes  . ROTATOR CUFF REPAIR  7/05   right  . VESICOVAGINAL FISTULA CLOSURE W/ TAH  1957    OB History    No data available       Home Medications    Prior to Admission medications   Medication Sig Start Date End Date Taking? Authorizing Provider  acetaminophen (TYLENOL) 500 MG tablet Take 1 tablet (500 mg total) by mouth every 6 (six) hours as needed for moderate pain. 02/11/15  Yes Tori Milks, MD  cholecalciferol (VITAMIN D) 400 units TABS tablet  Take 800 Units by mouth daily.   Yes [provider]  clotrimazole-betamethasone (LOTRISONE) cream Apply 1 application topically 2 (two) times daily.   Yes [provider]  Cranberry 500 MG CAPS Take 500 mg by mouth 2 (two) times daily.   Yes [provider]  dextromethorphan-guaiFENesin (ROBITUSSIN-DM) 10-100 MG/5ML liquid Take 10 mLs by mouth every 4 (four) hours as needed for cough.   Yes [provider]  dorzolamide (TRUSOPT) 2 % ophthalmic solution Place 1 drop into both eyes 3 (three) times daily.    Yes [provider]  folic acid (FOLVITE) 1 MG tablet Take 1 mg by mouth daily.   Yes [provider]  HUMALOG KWIKPEN 100 UNIT/ML KiwkPen Inject 3 Units into the skin 3 (three) times daily before meals. 8a, 12p, 5:30p 03/04/17  Yes [provider]  LANTUS 100 UNIT/ML injection Inject 8 Units into the skin daily. Has been taking 8 units into skin before breakfast 06/27/16  Yes [provider]  latanoprost (XALATAN) 0.005 % ophthalmic solution Place 1 drop into both eyes at bedtime.    Yes [provider]  loperamide (IMODIUM) 2 MG capsule Take 2 mg by mouth daily as needed for diarrhea or loose stools.   Yes [provider]  mirtazapine (REMERON) 15 MG tablet Take 15 mg by mouth at bedtime. 05/20/17  Yes [provider]  polyethylene glycol powder (GLYCOLAX/MIRALAX) powder Take 17 g by mouth 2 (two) times daily.  06/27/16  Yes [provider]  Probiotic Product (ALIGN) 4 MG CAPS Take 4 mg by mouth daily.    Yes [provider]  sodium chloride (OCEAN) 0.65 % SOLN nasal spray Place 2 sprays into both nostrils as needed for congestion. Reported on 02/14/2016   Yes [provider]  sorbitol 70 % solution Take 30 mLs by mouth daily as needed (constipation).    Yes [provider]  amLODipine (NORVASC) 10 MG tablet Take 1 tablet (10 mg total) by mouth daily. Patient not  taking: Reported on 05/30/2017 05/30/17   Georgette Shell, MD  bisacodyl (DULCOLAX) 5 MG EC tablet Take 2 tablets (10 mg total) by mouth daily. Patient not taking: Reported on 05/30/2017 05/30/17   Georgette Shell, MD  ciprofloxacin (CIPRO) 250 MG tablet Take 1 tablet (250 mg total) by mouth 2 (two) times daily. Patient not taking: Reported on 05/30/2017 05/29/17 06/08/17  Georgette Shell, MD  docusate sodium (COLACE) 100 MG capsule Take 2 capsules (200 mg total) by mouth 2 (two) times daily. Patient not taking: Reported on 05/30/2017 05/29/17   Georgette Shell, MD  hydrALAZINE (APRESOLINE) 50 MG tablet Take 1 tablet (50 mg total) by mouth every 8 (eight) hours. Patient not taking: Reported on 05/30/2017 05/29/17   Georgette Shell, MD  lactulose (CHRONULAC) 10 GM/15ML solution Take 30 mLs (20 g  total) by mouth daily. Patient not taking: Reported on 05/30/2017 05/30/17   Georgette Shell, MD  metroNIDAZOLE (FLAGYL) 500 MG tablet Take 1 tablet (500 mg total) by mouth every 8 (eight) hours. Patient not taking: Reported on 05/30/2017 05/29/17   Georgette Shell, MD  predniSONE (DELTASONE) 5 MG tablet Take 5 mg by mouth 2 (two) times daily with a meal.    [provider]    Family History Family History  Problem Relation Age of Onset  . Stroke Mother   . Cancer Father   . Hypertension Other     Social History Social History  Substance Use Topics  . Smoking status: Never Smoker  . Smokeless tobacco: Never Used  . Alcohol use No     Allergies   Penicillins; Sulfamethoxazole-trimethoprim; Bactrim; and Trimethoprim   Review of Systems Review of Systems  Respiratory: Positive for shortness of breath.   Gastrointestinal: Positive for abdominal distention and abdominal pain.  All other systems reviewed and are negative.    Physical Exam Updated Vital Signs BP (!) 192/56 (BP Location: Right Arm)   Pulse 98   Temp 97.9 F (36.6 C) (Oral)    Resp 20   SpO2 100%   Physical Exam  Constitutional: She appears well-developed and well-nourished.  HENT:  Head: Normocephalic and atraumatic.  Right Ear: External ear normal.  Left Ear: External ear normal.  Nose: Nose normal.  Mouth/Throat: Oropharynx is clear and moist.  Eyes: Pupils are equal, round, and reactive to light. Conjunctivae and EOM are normal.  Neck: Normal range of motion. Neck supple.  Cardiovascular: Normal rate, regular rhythm, normal heart sounds and intact distal pulses.   Pulmonary/Chest: Effort normal and breath sounds normal.  Abdominal: Soft. She exhibits distension. Bowel sounds are decreased. There is generalized tenderness.  Musculoskeletal: Normal range of motion.  Neurological: She is alert.  Skin: Skin is warm and dry.  Psychiatric: She has a normal mood and affect. Her behavior is normal.  Nursing note and vitals reviewed.    ED Treatments / Results  Labs (all labs ordered are listed, but only abnormal results are displayed) Labs Reviewed  COMPREHENSIVE METABOLIC PANEL - Abnormal; Notable for the following:       Result Value   Sodium 147 (*)    Chloride 113 (*)    Glucose, Bld 223 (*)    BUN 56 (*)    Creatinine, Ser 2.39 (*)    Albumin 3.3 (*)    GFR calc non Af Amer 17 (*)    GFR calc Af Amer 19 (*)    All other components within normal limits  CBC WITH DIFFERENTIAL/PLATELET - Abnormal; Notable for the following:    WBC 13.8 (*)    Hemoglobin 11.2 (*)    HCT 35.9 (*)    MCV 76.2 (*)    MCH 23.8 (*)    Neutro Abs 12.2 (*)    All other components within normal limits  TROPONIN I - Abnormal; Notable for the following:    Troponin I 0.04 (*)    All other components within normal limits  URINALYSIS, ROUTINE W REFLEX MICROSCOPIC - Abnormal; Notable for the following:    Color, Urine AMBER (*)    APPearance HAZY (*)    Hgb urine dipstick SMALL (*)    Protein, ur >=300 (*)    Leukocytes, UA SMALL (*)    Bacteria, UA MANY (*)     Squamous Epithelial / LPF 0-5 (*)    All  other components within normal limits  AMMONIA  LIPASE, BLOOD    EKG  EKG Interpretation None       Radiology Ct Abdomen Pelvis Wo Contrast  Result Date: 05/30/2017 CLINICAL DATA:  Abdominal distention with nausea and abdominal pain. EXAM: CT CHEST, ABDOMEN AND PELVIS WITHOUT CONTRAST TECHNIQUE: Multidetector CT imaging of the chest, abdomen and pelvis was performed following the standard protocol without IV contrast. COMPARISON:  Abdomen and pelvis CT 05/27/2017. FINDINGS: CT CHEST FINDINGS Cardiovascular: Heart size is increased. No pericardial effusion. Atherosclerotic calcification is noted in the wall of the thoracic aorta. Mediastinum/Nodes: No mediastinal lymphadenopathy. No evidence for gross hilar lymphadenopathy although assessment is limited by the lack of intravenous contrast on today's study. Fluid noted in the distal esophagus, likely related to reflux. There is no axillary lymphadenopathy. Lungs/Pleura: Evaluation of lung parenchyma degraded by breathing motion. Ill-defined ground-glass nodules are identified in the left upper lung with more confluent consolidative change in the posterior left upper lobe. This consolidative change tracks into the left hilum in there is associated prominent left lower lobe consolidation with air bronchograms. Small bilateral pleural effusions are evident. Musculoskeletal: Bone windows reveal no worrisome lytic or sclerotic osseous lesions. CT ABDOMEN PELVIS FINDINGS Hepatobiliary: No focal abnormality in the liver on this study without intravenous contrast. Gallbladder surgically absent. No intrahepatic or extrahepatic biliary dilation. Pancreas: Pancreas diffusely atrophic. Spleen: No splenomegaly. No focal mass lesion. Adrenals/Urinary Tract: No adrenal nodule or mass. Right kidney unremarkable. 12 mm hypoattenuating lesion in the left kidney is stable and likely a cyst. No hydroureteronephrosis. Urinary  bladder is nondistended. Stomach/Bowel: Stomach is markedly distended with fluid. Duodenum unremarkable. Small bowel loops in the central abdomen are fluid-filled and distended up to about 3 cm maximum diameter. Fluid is seen in the terminal ileum and the right colon is distended and fluid-filled. Fluid extends distally into the colon is for as the sigmoid segment were there is wall thickening and diverticular change. Fluid also noted in the rectum. Vascular/Lymphatic: There is abdominal aortic atherosclerosis without aneurysm. There is no gastrohepatic or hepatoduodenal ligament lymphadenopathy. No intraperitoneal or retroperitoneal lymphadenopathy. No pelvic sidewall lymphadenopathy. Reproductive: Uterus surgically absent.  There is no adnexal mass. Other: No substantial intraperitoneal free fluid. Musculoskeletal: Diffuse body wall edema noted. Bone windows reveal no worrisome lytic or sclerotic osseous lesions. IMPRESSION: 1. Left lower lobe consolidation compatible with pneumonia. There is consolidative change in the posterior left upper lobe with scattered ground-glass nodules in the left upper lung. This may represent an associated component of multifocal pneumonia. 2. Small bilateral pleural effusions. 3. Distended fluid-filled stomach is associated with distended fluid-filled small bowel and colon. In given that the distended fluid-filled colon extends distally to the sigmoid segment, imaging features are not suggestive of small-bowel obstruction. There is prominent diverticular change in the sigmoid colon and there may be an obstructing lesion or stenosis at this level resulting in the diffuse small bowel and colonic distention. Small amount of fluid is seen in the rectum, distal to the sigmoid segment. 4. Diffuse body wall edema. 5.  Aortic Atherosclerois (ICD10-170.0) Electronically Signed   By: Misty Stanley M.D.   On: 05/30/2017 12:40   Ct Chest Wo Contrast  Result Date: 05/30/2017 CLINICAL DATA:   Abdominal distention with nausea and abdominal pain. EXAM: CT CHEST, ABDOMEN AND PELVIS WITHOUT CONTRAST TECHNIQUE: Multidetector CT imaging of the chest, abdomen and pelvis was performed following the standard protocol without IV contrast. COMPARISON:  Abdomen and pelvis CT 05/27/2017. FINDINGS: CT  CHEST FINDINGS Cardiovascular: Heart size is increased. No pericardial effusion. Atherosclerotic calcification is noted in the wall of the thoracic aorta. Mediastinum/Nodes: No mediastinal lymphadenopathy. No evidence for gross hilar lymphadenopathy although assessment is limited by the lack of intravenous contrast on today's study. Fluid noted in the distal esophagus, likely related to reflux. There is no axillary lymphadenopathy. Lungs/Pleura: Evaluation of lung parenchyma degraded by breathing motion. Ill-defined ground-glass nodules are identified in the left upper lung with more confluent consolidative change in the posterior left upper lobe. This consolidative change tracks into the left hilum in there is associated prominent left lower lobe consolidation with air bronchograms. Small bilateral pleural effusions are evident. Musculoskeletal: Bone windows reveal no worrisome lytic or sclerotic osseous lesions. CT ABDOMEN PELVIS FINDINGS Hepatobiliary: No focal abnormality in the liver on this study without intravenous contrast. Gallbladder surgically absent. No intrahepatic or extrahepatic biliary dilation. Pancreas: Pancreas diffusely atrophic. Spleen: No splenomegaly. No focal mass lesion. Adrenals/Urinary Tract: No adrenal nodule or mass. Right kidney unremarkable. 12 mm hypoattenuating lesion in the left kidney is stable and likely a cyst. No hydroureteronephrosis. Urinary bladder is nondistended. Stomach/Bowel: Stomach is markedly distended with fluid. Duodenum unremarkable. Small bowel loops in the central abdomen are fluid-filled and distended up to about 3 cm maximum diameter. Fluid is seen in the terminal  ileum and the right colon is distended and fluid-filled. Fluid extends distally into the colon is for as the sigmoid segment were there is wall thickening and diverticular change. Fluid also noted in the rectum. Vascular/Lymphatic: There is abdominal aortic atherosclerosis without aneurysm. There is no gastrohepatic or hepatoduodenal ligament lymphadenopathy. No intraperitoneal or retroperitoneal lymphadenopathy. No pelvic sidewall lymphadenopathy. Reproductive: Uterus surgically absent.  There is no adnexal mass. Other: No substantial intraperitoneal free fluid. Musculoskeletal: Diffuse body wall edema noted. Bone windows reveal no worrisome lytic or sclerotic osseous lesions. IMPRESSION: 1. Left lower lobe consolidation compatible with pneumonia. There is consolidative change in the posterior left upper lobe with scattered ground-glass nodules in the left upper lung. This may represent an associated component of multifocal pneumonia. 2. Small bilateral pleural effusions. 3. Distended fluid-filled stomach is associated with distended fluid-filled small bowel and colon. In given that the distended fluid-filled colon extends distally to the sigmoid segment, imaging features are not suggestive of small-bowel obstruction. There is prominent diverticular change in the sigmoid colon and there may be an obstructing lesion or stenosis at this level resulting in the diffuse small bowel and colonic distention. Small amount of fluid is seen in the rectum, distal to the sigmoid segment. 4. Diffuse body wall edema. 5.  Aortic Atherosclerois (ICD10-170.0) Electronically Signed   By: Misty Stanley M.D.   On: 05/30/2017 12:40   Dg Abdomen Acute W/chest  Result Date: 05/30/2017 CLINICAL DATA:  Shortness of breath . EXAM: DG ABDOMEN ACUTE W/ 1V CHEST COMPARISON:  CT 05/27/2017.  Abdominal series 1016 2018. FINDINGS: Chest x-ray reveals left lower lobe atelectasis and consolidation. Small left pleural effusion. Surgical clips  right upper quadrant. Multiple loops of distended small bowel are noted. There is a paucity of colonic gas. Findings consistent with small-bowel obstruction. IMPRESSION: 1. Multiple dilated loops of small bowel with paucity of colonic gas noted on today's exam. Findings suggest small bowel obstruction. Follow-up exam suggested to demonstrate resolution. No free air identified. 2. Left lower lobe atelectasis and consolidation. Small left pleural effusion. Electronically Signed   By: Marcello Moores  Register   On: 05/30/2017 10:21    Procedures Procedures (including critical care  time)  Medications Ordered in ED Medications  aztreonam (AZACTAM) 2 g in dextrose 5 % 50 mL IVPB (2 g Intravenous New Bag/Given 05/30/17 1414)  vancomycin (VANCOCIN) IVPB 1000 mg/200 mL premix (not administered)  sodium chloride 0.9 % bolus 1,000 mL (0 mLs Intravenous Stopped 05/30/17 1156)     Initial Impression / Assessment and Plan / ED Course  I have reviewed the triage vital signs and the nursing notes.  Pertinent labs & imaging results that were available during my care of the patient were reviewed by me and considered in my medical decision making (see chart for details).    Pt has a pna on CT chest which was not there on CT done on 10/16.  I gave her vancomycin and azactam for HCAP as she is pcn allergic.  She does not have a sbo, but likely has an ileus from pna and hospitalization.  Pt d/w Dr. Manuella Ghazi ([facility]) for admission.   Final Clinical Impressions(s) / ED Diagnoses   Final diagnoses:  HCAP (healthcare-associated pneumonia)  Hypoxia  Ileus ([place])  Stage 4 chronic kidney disease ([place])  Hypernatremia    New Prescriptions New Prescriptions   No medications on file     Isla Pence, MD 05/30/17 1432

## 2017-05-30 NOTE — ED Notes (Signed)
hospitalist at bedside

## 2017-05-30 NOTE — ED Triage Notes (Addendum)
Pt arrived to [facility] via [facility] c/o shortness of breath, nausea, and abdominal pain. from [facility]. Pt is at baseline and has dementia. Pt has RLQ pain and abd distention per EMS. Pt has had diarrhea for 2 days and was just discharged from the hospital yesterday.

## 2017-05-30 NOTE — H&P (Signed)
History and Physical    Allison Rodgers VOZ:[id number] DOB: 1923-12-10 DOA: 05/30/2017  PCP: Sande Brothers, MD  Patient coming from: SNF  Chief Complaint: Dyspnea and abdominal opain  HPI: Allison Rodgers is a 83 y.o. female with medical history significant for   She presented to the emergency department today with complaints of shortness of breath and abdominal pain with recent admission on 10/13 for altered mentation. She had a CT abdomen/pelvis on 10/16 with stable changes of diverticulosis and a relative dilation of the colon with retention of fecal material. The patient has apparently not been eating well according to family member since 10/13 and was discharged on 10/18 with a diagnosis of diverticulitis and was placed on ciprofloxacin and Flagyl.  ED Course: In the emergency department, she was noted to have signs of left lower lobe pneumonia on CT of the chest as well as developing ileus and CT abdomen with no clear signs of obstruction. She was noted to have leukocytosis of 13.8 and is noted to be hypernatremic with sodium of 147 and also appears to have acute kidney injury with creatinine of 2.39, whereas it was 1.8 on discharge on 10/18. Her BP was noted to be quite elevated, but she has not taken her BP meds this AM.  Review of Systems: As per HPI otherwise 10 point review of systems negative.    Past Medical History:  Diagnosis Date  . Arthritis   . Bacterial meningitis   . 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 Chronic abdominal pain   . Dementia   . Diverticulosis   . DJD (degenerative joint disease)   . DM (diabetes mellitus) ([place])   . Dyslipidemia   . Edema   . Esophageal stricture   . Gastritis, chronic   . GERD (gastroesophageal reflux disease)   . Hiatal hernia   . Hx of adenomatous colonic polyps   . Hypertension   . Hypertensive cardiovascular disease   . Microcytic anemia   . Obesity   . Renal disorder     Past  Surgical History:  Procedure Laterality Date  . APPENDECTOMY    . BACK SURGERY    . BILATERAL OOPHORECTOMY  1957  . CHOLECYSTECTOMY  1999  . COLONOSCOPY WITH PROPOFOL N/A 10/31/2016   Procedure: COLONOSCOPY WITH PROPOFOL;  Surgeon: Milus Banister, MD;  Location: [facility];  Service: Endoscopy;  Laterality: N/A;  . HERNIA REPAIR  11/2593   umbilical and ventral hernia repair by Dr. Margot Chimes  . ROTATOR CUFF REPAIR  7/05   right  . VESICOVAGINAL FISTULA CLOSURE W/ TAH  1957     reports that she has never smoked. She has never used smokeless tobacco. She reports that she does not drink alcohol or use drugs.  Allergies  Allergen Reactions  . Penicillins Other (See Comments)    REACTION: causes hallucinations  . Sulfamethoxazole-Trimethoprim Swelling    REACTION: rash  . Bactrim     Unknown per mar  . Trimethoprim     Unknown per mar     Family History  Problem Relation Age of Onset  . Stroke Mother   . Cancer Father   . Hypertension Other     Prior to Admission medications   Medication Sig Start Date End Date Taking? Authorizing Provider  acetaminophen (TYLENOL) 500 MG tablet Take 1 tablet (500 mg total) by mouth every 6 (six) hours as needed for moderate pain. 02/11/15  Yes Tori Milks, MD  cholecalciferol (VITAMIN D) 400 units TABS tablet Take 800 Units by mouth daily.   Yes [provider]  clotrimazole-betamethasone (LOTRISONE) cream Apply 1 application topically 2 (two) times daily.   Yes [provider]  Cranberry 500 MG CAPS Take 500 mg by mouth 2 (two) times daily.   Yes [provider]  dextromethorphan-guaiFENesin (ROBITUSSIN-DM) 10-100 MG/5ML liquid Take 10 mLs by mouth every 4 (four) hours as needed for cough.   Yes [provider]  dorzolamide (TRUSOPT) 2 % ophthalmic solution Place 1 drop into both eyes 3 (three) times daily.    Yes [provider]  folic acid (FOLVITE) 1 MG tablet Take 1 mg by mouth daily.   Yes  [provider]  HUMALOG KWIKPEN 100 UNIT/ML KiwkPen Inject 3 Units into the skin 3 (three) times daily before meals. 8a, 12p, 5:30p 03/04/17  Yes [provider]  LANTUS 100 UNIT/ML injection Inject 8 Units into the skin daily. Has been taking 8 units into skin before breakfast 06/27/16  Yes [provider]  latanoprost (XALATAN) 0.005 % ophthalmic solution Place 1 drop into both eyes at bedtime.    Yes [provider]  loperamide (IMODIUM) 2 MG capsule Take 2 mg by mouth daily as needed for diarrhea or loose stools.   Yes [provider]  mirtazapine (REMERON) 15 MG tablet Take 15 mg by mouth at bedtime. 05/20/17  Yes [provider]  polyethylene glycol powder (GLYCOLAX/MIRALAX) powder Take 17 g by mouth 2 (two) times daily.  06/27/16  Yes [provider]  Probiotic Product (ALIGN) 4 MG CAPS Take 4 mg by mouth daily.    Yes [provider]  sodium chloride (OCEAN) 0.65 % SOLN nasal spray Place 2 sprays into both nostrils as needed for congestion. Reported on 02/14/2016   Yes [provider]  sorbitol 70 % solution Take 30 mLs by mouth daily as needed (constipation).    Yes [provider]  amLODipine (NORVASC) 10 MG tablet Take 1 tablet (10 mg total) by mouth daily. Patient not taking: Reported on 05/30/2017 05/30/17   Georgette Shell, MD  bisacodyl (DULCOLAX) 5 MG EC tablet Take 2 tablets (10 mg total) by mouth daily. Patient not taking: Reported on 05/30/2017 05/30/17   Georgette Shell, MD  ciprofloxacin (CIPRO) 250 MG tablet Take 1 tablet (250 mg total) by mouth 2 (two) times daily. Patient not taking: Reported on 05/30/2017 05/29/17 06/08/17  Georgette Shell, MD  docusate sodium (COLACE) 100 MG capsule Take 2 capsules (200 mg total) by mouth 2 (two) times daily. Patient not taking: Reported on 05/30/2017 05/29/17   Georgette Shell, MD  hydrALAZINE (APRESOLINE) 50 MG tablet Take 1 tablet  (50 mg total) by mouth every 8 (eight) hours. Patient not taking: Reported on 05/30/2017 05/29/17   Georgette Shell, MD  lactulose (CHRONULAC) 10 GM/15ML solution Take 30 mLs (20 g total) by mouth daily. Patient not taking: Reported on 05/30/2017 05/30/17   Georgette Shell, MD  metroNIDAZOLE (FLAGYL) 500 MG tablet Take 1 tablet (500 mg total) by mouth every 8 (eight) hours. Patient not taking: Reported on 05/30/2017 05/29/17   Georgette Shell, MD  predniSONE (DELTASONE) 5 MG tablet Take 5 mg by mouth 2 (two) times daily with a meal.    [provider]    Physical Exam: Vitals:   05/30/17 1613 05/30/17 1630 05/30/17 1700 05/30/17 1757  BP: (!) 189/57 (!) 166/56 (!) 168/67 (!) 157/87  Pulse:  95 98 91 97  Resp: 19 (!) 23 20 20   Temp:    98.3 F (36.8 C)  TempSrc:    Oral  SpO2: 100% 100% 100% 100%    Constitutional: NAD, calm, comfortable Vitals:   05/30/17 1613 05/30/17 1630 05/30/17 1700 05/30/17 1757  BP: (!) 189/57 (!) 166/56 (!) 168/67 (!) 157/87  Pulse: 95 98 91 97  Resp: 19 (!) 23 20 20   Temp:    98.3 F (36.8 C)  TempSrc:    Oral  SpO2: 100% 100% 100% 100%   Eyes: PERRL, lids and conjunctivae normal; confused on  ENMT: Mucous membranes are moist. Posterior pharynx clear of any exudate or lesions.Normal dentition.  Neck: normal, supple, no masses, no thyromegaly Respiratory: clear to auscultation bilaterally, no wheezing, no crackles. Normal respiratory effort. No accessory muscle use.  Cardiovascular: Regular rate and rhythm, no murmurs / rubs / gallops. No extremity edema. 2+ pedal pulses. No carotid bruits.  Abdomen: distended, soft, non-tender Musculoskeletal: no clubbing / cyanosis. No joint deformity upper and lower extremities. Good ROM, no contractures. Normal muscle tone.  Skin: no rashes, lesions, ulcers. No induration Neurologic: CN 2-12 grossly intact. Sensation intact, DTR normal. Strength 5/5 in all 4.  Psychiatric: Normal judgment  and insight. Alert and oriented x 3. Normal mood.   Labs on Admission: I have personally reviewed following labs and imaging studies  CBC:  Recent Labs Lab 05/24/17 1524 05/25/17 0047 05/26/17 0858 05/28/17 0349 05/30/17 0918  WBC 7.7 5.1 5.2 7.9 13.8*  NEUTROABS 5.3  --   --   --  12.2*  HGB 11.1* 12.0 11.8* 12.5 11.2*  HCT 36.1 37.8 38.1 38.8 35.9*  MCV 76.5* 76.1* 75.7* 74.8* 76.2*  PLT 172 141* 142* 172 458   Basic Metabolic Panel:  Recent Labs Lab 05/26/17 0858 05/27/17 0357 05/28/17 0349 05/29/17 0401 05/30/17 0918  NA 142 142 142 146* 147*  K 3.3* 3.5 3.2* 2.9* 3.7  CL 108 112* 110 114* 113*  CO2 24 24 21* 23 24  GLUCOSE 145* 123* 172* 158* 223*  BUN 19 17 18  28* 56*  CREATININE 1.74* 1.62* 1.64* 1.80* 2.39*  CALCIUM 8.8* 8.7* 8.7* 8.8* 9.2  MG  --  1.7  --   --   --    GFR: Estimated Creatinine Clearance: 14.1 mL/min (A) (by C-G formula based on SCr of 2.39 mg/dL (H)). Liver Function Tests:  Recent Labs Lab 05/24/17 1630 05/25/17 0047 05/30/17 0918  AST 38 23 19  ALT 15 14 17   ALKPHOS 86 84 65  BILITOT 0.7 0.6 0.8  PROT 7.1 6.8 6.9  ALBUMIN 3.2* 3.1* 3.3*    Recent Labs Lab 05/30/17 0918  LIPASE 16    Recent Labs Lab 05/24/17 1632 05/30/17 0918  AMMONIA 20 17   Coagulation Profile: No results for input(s): INR, PROTIME in the last 168 hours. Cardiac Enzymes:  Recent Labs Lab 05/25/17 0047 05/30/17 0918  TROPONINI <0.03 0.04*   BNP (last 3 results) No results for input(s): PROBNP in the last 8760 hours. HbA1C: No results for input(s): HGBA1C in the last 72 hours. CBG:  Recent Labs Lab 05/28/17 1206 05/28/17 1746 05/28/17 2238 05/29/17 0740 05/29/17 1226  GLUCAP 177* 168* 157* 170* 195*   Lipid Profile: No results for input(s): CHOL, HDL, LDLCALC, TRIG, CHOLHDL, LDLDIRECT in the last 72 hours. Thyroid Function Tests: No results for input(s): TSH, T4TOTAL, FREET4, T3FREE, THYROIDAB in the last 72 hours. Anemia  Panel: No results for input(s):  VITAMINB12, FOLATE, FERRITIN, TIBC, IRON, RETICCTPCT in the last 72 hours. Urine analysis:    Component Value Date/Time   COLORURINE AMBER (A) 05/30/2017 0822   APPEARANCEUR HAZY (A) 05/30/2017 0822   LABSPEC 1.024 05/30/2017 0822   PHURINE 5.0 05/30/2017 0822   GLUCOSEU NEGATIVE 05/30/2017 0822   GLUCOSEU NEGATIVE 11/15/2013 1134   HGBUR SMALL (A) 05/30/2017 0822   BILIRUBINUR NEGATIVE 05/30/2017 0822   KETONESUR NEGATIVE 05/30/2017 0822   PROTEINUR >=300 (A) 05/30/2017 0822   UROBILINOGEN 0.2 11/15/2013 1134   NITRITE NEGATIVE 05/30/2017 0822   LEUKOCYTESUR SMALL (A) 05/30/2017 0822    Radiological Exams on Admission: Ct Abdomen Pelvis Wo Contrast  Result Date: 05/30/2017 CLINICAL DATA:  Abdominal distention with nausea and abdominal pain. EXAM: CT CHEST, ABDOMEN AND PELVIS WITHOUT CONTRAST TECHNIQUE: Multidetector CT imaging of the chest, abdomen and pelvis was performed following the standard protocol without IV contrast. COMPARISON:  Abdomen and pelvis CT 05/27/2017. FINDINGS: CT CHEST FINDINGS Cardiovascular: Heart size is increased. No pericardial effusion. Atherosclerotic calcification is noted in the wall of the thoracic aorta. Mediastinum/Nodes: No mediastinal lymphadenopathy. No evidence for gross hilar lymphadenopathy although assessment is limited by the lack of intravenous contrast on today's study. Fluid noted in the distal esophagus, likely related to reflux. There is no axillary lymphadenopathy. Lungs/Pleura: Evaluation of lung parenchyma degraded by breathing motion. Ill-defined ground-glass nodules are identified in the left upper lung with more confluent consolidative change in the posterior left upper lobe. This consolidative change tracks into the left hilum in there is associated prominent left lower lobe consolidation with air bronchograms. Small bilateral pleural effusions are evident. Musculoskeletal: Bone windows reveal no worrisome  lytic or sclerotic osseous lesions. CT ABDOMEN PELVIS FINDINGS Hepatobiliary: No focal abnormality in the liver on this study without intravenous contrast. Gallbladder surgically absent. No intrahepatic or extrahepatic biliary dilation. Pancreas: Pancreas diffusely atrophic. Spleen: No splenomegaly. No focal mass lesion. Adrenals/Urinary Tract: No adrenal nodule or mass. Right kidney unremarkable. 12 mm hypoattenuating lesion in the left kidney is stable and likely a cyst. No hydroureteronephrosis. Urinary bladder is nondistended. Stomach/Bowel: Stomach is markedly distended with fluid. Duodenum unremarkable. Small bowel loops in the central abdomen are fluid-filled and distended up to about 3 cm maximum diameter. Fluid is seen in the terminal ileum and the right colon is distended and fluid-filled. Fluid extends distally into the colon is for as the sigmoid segment were there is wall thickening and diverticular change. Fluid also noted in the rectum. Vascular/Lymphatic: There is abdominal aortic atherosclerosis without aneurysm. There is no gastrohepatic or hepatoduodenal ligament lymphadenopathy. No intraperitoneal or retroperitoneal lymphadenopathy. No pelvic sidewall lymphadenopathy. Reproductive: Uterus surgically absent.  There is no adnexal mass. Other: No substantial intraperitoneal free fluid. Musculoskeletal: Diffuse body wall edema noted. Bone windows reveal no worrisome lytic or sclerotic osseous lesions. IMPRESSION: 1. Left lower lobe consolidation compatible with pneumonia. There is consolidative change in the posterior left upper lobe with scattered ground-glass nodules in the left upper lung. This may represent an associated component of multifocal pneumonia. 2. Small bilateral pleural effusions. 3. Distended fluid-filled stomach is associated with distended fluid-filled small bowel and colon. In given that the distended fluid-filled colon extends distally to the sigmoid segment, imaging features are  not suggestive of small-bowel obstruction. There is prominent diverticular change in the sigmoid colon and there may be an obstructing lesion or stenosis at this level resulting in the diffuse small bowel and colonic distention. Small amount of fluid is seen in the rectum, distal  to the sigmoid segment. 4. Diffuse body wall edema. 5.  Aortic Atherosclerois (ICD10-170.0) Electronically Signed   By: Misty Stanley M.D.   On: 05/30/2017 12:40   Ct Chest Wo Contrast  Result Date: 05/30/2017 CLINICAL DATA:  Abdominal distention with nausea and abdominal pain. EXAM: CT CHEST, ABDOMEN AND PELVIS WITHOUT CONTRAST TECHNIQUE: Multidetector CT imaging of the chest, abdomen and pelvis was performed following the standard protocol without IV contrast. COMPARISON:  Abdomen and pelvis CT 05/27/2017. FINDINGS: CT CHEST FINDINGS Cardiovascular: Heart size is increased. No pericardial effusion. Atherosclerotic calcification is noted in the wall of the thoracic aorta. Mediastinum/Nodes: No mediastinal lymphadenopathy. No evidence for gross hilar lymphadenopathy although assessment is limited by the lack of intravenous contrast on today's study. Fluid noted in the distal esophagus, likely related to reflux. There is no axillary lymphadenopathy. Lungs/Pleura: Evaluation of lung parenchyma degraded by breathing motion. Ill-defined ground-glass nodules are identified in the left upper lung with more confluent consolidative change in the posterior left upper lobe. This consolidative change tracks into the left hilum in there is associated prominent left lower lobe consolidation with air bronchograms. Small bilateral pleural effusions are evident. Musculoskeletal: Bone windows reveal no worrisome lytic or sclerotic osseous lesions. CT ABDOMEN PELVIS FINDINGS Hepatobiliary: No focal abnormality in the liver on this study without intravenous contrast. Gallbladder surgically absent. No intrahepatic or extrahepatic biliary dilation.  Pancreas: Pancreas diffusely atrophic. Spleen: No splenomegaly. No focal mass lesion. Adrenals/Urinary Tract: No adrenal nodule or mass. Right kidney unremarkable. 12 mm hypoattenuating lesion in the left kidney is stable and likely a cyst. No hydroureteronephrosis. Urinary bladder is nondistended. Stomach/Bowel: Stomach is markedly distended with fluid. Duodenum unremarkable. Small bowel loops in the central abdomen are fluid-filled and distended up to about 3 cm maximum diameter. Fluid is seen in the terminal ileum and the right colon is distended and fluid-filled. Fluid extends distally into the colon is for as the sigmoid segment were there is wall thickening and diverticular change. Fluid also noted in the rectum. Vascular/Lymphatic: There is abdominal aortic atherosclerosis without aneurysm. There is no gastrohepatic or hepatoduodenal ligament lymphadenopathy. No intraperitoneal or retroperitoneal lymphadenopathy. No pelvic sidewall lymphadenopathy. Reproductive: Uterus surgically absent.  There is no adnexal mass. Other: No substantial intraperitoneal free fluid. Musculoskeletal: Diffuse body wall edema noted. Bone windows reveal no worrisome lytic or sclerotic osseous lesions. IMPRESSION: 1. Left lower lobe consolidation compatible with pneumonia. There is consolidative change in the posterior left upper lobe with scattered ground-glass nodules in the left upper lung. This may represent an associated component of multifocal pneumonia. 2. Small bilateral pleural effusions. 3. Distended fluid-filled stomach is associated with distended fluid-filled small bowel and colon. In given that the distended fluid-filled colon extends distally to the sigmoid segment, imaging features are not suggestive of small-bowel obstruction. There is prominent diverticular change in the sigmoid colon and there may be an obstructing lesion or stenosis at this level resulting in the diffuse small bowel and colonic distention. Small  amount of fluid is seen in the rectum, distal to the sigmoid segment. 4. Diffuse body wall edema. 5.  Aortic Atherosclerois (ICD10-170.0) Electronically Signed   By: Misty Stanley M.D.   On: 05/30/2017 12:40   Dg Abdomen Acute W/chest  Result Date: 05/30/2017 CLINICAL DATA:  Shortness of breath . EXAM: DG ABDOMEN ACUTE W/ 1V CHEST COMPARISON:  CT 05/27/2017.  Abdominal series 1016 2018. FINDINGS: Chest x-ray reveals left lower lobe atelectasis and consolidation. Small left pleural effusion. Surgical clips right upper quadrant.  Multiple loops of distended small bowel are noted. There is a paucity of colonic gas. Findings consistent with small-bowel obstruction. IMPRESSION: 1. Multiple dilated loops of small bowel with paucity of colonic gas noted on today's exam. Findings suggest small bowel obstruction. Follow-up exam suggested to demonstrate resolution. No free air identified. 2. Left lower lobe atelectasis and consolidation. Small left pleural effusion. Electronically Signed   By: Marcello Moores  Register   On: 05/30/2017 10:21    Assessment/Plan Principal Problem:   Pneumonia Active Problems:   Ileus ([place])   Hypernatremia   Diabetes mellitus type 2, controlled ([place])   Dementia   AKI (acute kidney injury) ([place])   Protein-calorie malnutrition ([place])   Left lower lobe HCAP  Abx with Vanc and Cefepime Cultures and resp panel pending  Ileus  Bowel rest D5W  Suppositories Adv diet as tolerated  Hypernatremia  D5W AM labs  DM2  SSI  Home Levemir  AKI on CKD  Monitor AM labs Renally dose medications   DVT prophylaxis: Lovenox 30mg  daily Code Status: DNR Family Communication: Son Disposition Plan: SNF in 2-3 days Consults called: None Admission status: Inpatient   Pratik Darleen Crocker DO [facility] Pager [phone]  If 7PM-7AM, please contact night-coverage [email or web address] Password [email or web address]  05/30/2017, 6:40 PM
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
## 2017-05-30 NOTE — ED Notes (Signed)
This RN attempted IV access unsuccessfully. Another RN attempting access.

## 2017-05-30 NOTE — ED Notes (Signed)
Bed: WA10 Expected date:  Expected time:  Means of arrival:  Comments: EMS-SOB 

## 2017-05-30 NOTE — Progress Notes (Signed)
ANTICOAGULATION CONSULT NOTE - Initial Consult  Pharmacy Consult for Enoxaparin Indication: VTE ppx w/ AKI  Allergies  Allergen Reactions  . Penicillins Other (See Comments)    REACTION: causes hallucinations  . Sulfamethoxazole-Trimethoprim Swelling    REACTION: rash  . Bactrim     Unknown per mar  . Trimethoprim     Unknown per mar     Patient Measurements: Height: 5' (152.4 cm) IBW/kg (Calculated) : 45.5  Vital Signs: Temp: 98.3 F (36.8 C) (10/19 1757) Temp Source: Oral (10/19 1757) BP: 157/87 (10/19 1757) Pulse Rate: 97 (10/19 1757)  Labs:  Recent Labs  05/28/17 0349 05/29/17 0401 05/30/17 0918  HGB 12.5  --  11.2*  HCT 38.8  --  35.9*  PLT 172  --  207  CREATININE 1.64* 1.80* 2.39*  TROPONINI  --   --  0.04*    Estimated Creatinine Clearance: 14.1 mL/min (A) (by C-G formula based on SCr of 2.39 mg/dL (H)).   Medical History: Past Medical History:  Diagnosis Date  . Arthritis   . Bacterial meningitis   . 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 Chronic abdominal pain   . Dementia   . Diverticulosis   . DJD (degenerative joint disease)   . DM (diabetes mellitus) ([place])   . Dyslipidemia   . Edema   . Esophageal stricture   . Gastritis, chronic   . GERD (gastroesophageal reflux disease)   . Hiatal hernia   . Hx of adenomatous colonic polyps   . Hypertension   . Hypertensive cardiovascular disease   . Microcytic anemia   . Obesity   . Renal disorder     Medications:  Prescriptions Prior to Admission  Medication Sig Dispense Refill Last Dose  . acetaminophen (TYLENOL) 500 MG tablet Take 1 tablet (500 mg total) by mouth every 6 (six) hours as needed for moderate pain. 30 tablet 0 unknown  . cholecalciferol (VITAMIN D) 400 units TABS tablet Take 800 Units by mouth daily.   05/29/2017 at Unknown time  . clotrimazole-betamethasone (LOTRISONE) cream Apply 1 application topically 2 (two) times daily.   05/29/2017 at  Unknown time  . Cranberry 500 MG CAPS Take 500 mg by mouth 2 (two) times daily.   05/29/2017 at Unknown time  . dextromethorphan-guaiFENesin (ROBITUSSIN-DM) 10-100 MG/5ML liquid Take 10 mLs by mouth every 4 (four) hours as needed for cough.   unknown  . dorzolamide (TRUSOPT) 2 % ophthalmic solution Place 1 drop into both eyes 3 (three) times daily.    05/29/2017 at Unknown time  . folic acid (FOLVITE) 1 MG tablet Take 1 mg by mouth daily.   05/29/2017 at Unknown time  . HUMALOG KWIKPEN 100 UNIT/ML KiwkPen Inject 3 Units into the skin 3 (three) times daily before meals. 8a, 12p, 5:30p   05/29/2017 at Unknown time  . LANTUS 100 UNIT/ML injection Inject 8 Units into the skin daily. Has been taking 8 units into skin before breakfast   05/28/2017  . latanoprost (XALATAN) 0.005 % ophthalmic solution Place 1 drop into both eyes at bedtime.    05/29/2017 at Unknown time  . loperamide (IMODIUM) 2 MG capsule Take 2 mg by mouth daily as needed for diarrhea or loose stools.   unknown  . mirtazapine (REMERON) 15 MG tablet Take 15 mg by mouth at bedtime.   05/29/2017 at Unknown time  . polyethylene glycol powder (GLYCOLAX/MIRALAX) powder Take 17 g by mouth 2 (two) times daily.  05/29/2017 at Unknown time  . Probiotic Product (ALIGN) 4 MG CAPS Take 4 mg by mouth daily.    05/29/2017 at Unknown time  . sodium chloride (OCEAN) 0.65 % SOLN nasal spray Place 2 sprays into both nostrils as needed for congestion. Reported on 02/14/2016   unknown  . sorbitol 70 % solution Take 30 mLs by mouth daily as needed (constipation).    unknown  . amLODipine (NORVASC) 10 MG tablet Take 1 tablet (10 mg total) by mouth daily. (Patient not taking: Reported on 05/30/2017) 30 tablet 0 Not Taking at Unknown time  . bisacodyl (DULCOLAX) 5 MG EC tablet Take 2 tablets (10 mg total) by mouth daily. (Patient not taking: Reported on 05/30/2017) 30 tablet 0 Not Taking at Unknown time  . ciprofloxacin (CIPRO) 250 MG tablet Take 1 tablet (250 mg  total) by mouth 2 (two) times daily. (Patient not taking: Reported on 05/30/2017) 20 tablet 0 Not Taking at Unknown time  . docusate sodium (COLACE) 100 MG capsule Take 2 capsules (200 mg total) by mouth 2 (two) times daily. (Patient not taking: Reported on 05/30/2017) 10 capsule 0 Not Taking at Unknown time  . hydrALAZINE (APRESOLINE) 50 MG tablet Take 1 tablet (50 mg total) by mouth every 8 (eight) hours. (Patient not taking: Reported on 05/30/2017) 60 tablet 0 Not Taking at Unknown time  . lactulose (CHRONULAC) 10 GM/15ML solution Take 30 mLs (20 g total) by mouth daily. (Patient not taking: Reported on 05/30/2017) 240 mL 0 Not Taking at Unknown time  . metroNIDAZOLE (FLAGYL) 500 MG tablet Take 1 tablet (500 mg total) by mouth every 8 (eight) hours. (Patient not taking: Reported on 05/30/2017) 21 tablet 0 Not Taking at Unknown time  . predniSONE (DELTASONE) 5 MG tablet Take 5 mg by mouth 2 (two) times daily with a meal.   05/13/2017   Scheduled:  . amLODipine  10 mg Oral Daily  . hydrALAZINE  50 mg Oral Q8H  . insulin glargine  8 Units Subcutaneous QAC breakfast   PRN: bisacodyl, ondansetron **OR** ondansetron (ZOFRAN) IV  Assessment: 83 y.o. female admitted 05/30/2017 for HCAP and ileus. Noted to have AKI on CKD; pharmacy consulted to dose lovenox for VTE ppx   CBC: Hgb slightly low; Plt wnl  SCr: elevated (baseline 1.5); CrCl 14 ml/min  Previous anticoagulation: none   Goal of Therapy: Prevention of VTE  Plan:  Lovenox 30 mg SQ q24 hr  F/u SCr closely; if worsens would consider switching to SQ heparin (5000 units q12 hr)   Reuel Boom, PharmD, BCPS Pager: [phone] 05/30/2017, 7:32 PM

## 2017-05-30 NOTE — Progress Notes (Signed)
A consult was received from an ED physician for vanc per pharmacy dosing.  The patient's profile has been reviewed for ht/wt/allergies/indication/available labs.   A one time order has been placed for 1g x1.  Further antibiotics/pharmacy consults should be ordered by admitting physician if indicated.                       Thank you, Kara Mead 05/30/2017  1:24 PM

## 2017-05-31 DIAGNOSIS — J181 Lobar pneumonia, unspecified organism: Secondary | ICD-10-CM

## 2017-05-31 LAB — RESPIRATORY PANEL BY PCR
Adenovirus: NOT DETECTED
BORDETELLA PERTUSSIS-RVPCR: NOT DETECTED
CORONAVIRUS HKU1-RVPPCR: NOT DETECTED
CORONAVIRUS NL63-RVPPCR: NOT DETECTED
CORONAVIRUS OC43-RVPPCR: NOT DETECTED
Chlamydophila pneumoniae: NOT DETECTED
Coronavirus 229E: NOT DETECTED
Influenza A: NOT DETECTED
Influenza B: NOT DETECTED
METAPNEUMOVIRUS-RVPPCR: NOT DETECTED
Mycoplasma pneumoniae: NOT DETECTED
PARAINFLUENZA VIRUS 3-RVPPCR: NOT DETECTED
PARAINFLUENZA VIRUS 4-RVPPCR: NOT DETECTED
Parainfluenza Virus 1: NOT DETECTED
Parainfluenza Virus 2: NOT DETECTED
RHINOVIRUS / ENTEROVIRUS - RVPPCR: NOT DETECTED
Respiratory Syncytial Virus: NOT DETECTED

## 2017-05-31 LAB — GLUCOSE, CAPILLARY
GLUCOSE-CAPILLARY: 188 mg/dL — AB (ref 65–99)
Glucose-Capillary: 143 mg/dL — ABNORMAL HIGH (ref 65–99)
Glucose-Capillary: 126 mg/dL — ABNORMAL HIGH (ref 65–99)

## 2017-05-31 LAB — CBC
HCT: 34.1 % — ABNORMAL LOW (ref 36.0–46.0)
HEMOGLOBIN: 10.4 g/dL — AB (ref 12.0–15.0)
MCH: 23.5 pg — ABNORMAL LOW (ref 26.0–34.0)
MCHC: 30.5 g/dL (ref 30.0–36.0)
MCV: 77 fL — ABNORMAL LOW (ref 78.0–100.0)
PLATELETS: 181 10*3/uL (ref 150–400)
RBC: 4.43 MIL/uL (ref 3.87–5.11)
RDW: 15.4 % (ref 11.5–15.5)
WBC: 13.5 10*3/uL — AB (ref 4.0–10.5)

## 2017-05-31 LAB — COMPREHENSIVE METABOLIC PANEL
ALK PHOS: 58 U/L (ref 38–126)
ALT: 14 U/L (ref 14–54)
AST: 14 U/L — ABNORMAL LOW (ref 15–41)
Albumin: 2.9 g/dL — ABNORMAL LOW (ref 3.5–5.0)
Anion gap: 7 (ref 5–15)
BILIRUBIN TOTAL: 0.6 mg/dL (ref 0.3–1.2)
BUN: 61 mg/dL — ABNORMAL HIGH (ref 6–20)
CALCIUM: 8.8 mg/dL — AB (ref 8.9–10.3)
CO2: 23 mmol/L (ref 22–32)
CREATININE: 2.12 mg/dL — AB (ref 0.44–1.00)
Chloride: 116 mmol/L — ABNORMAL HIGH (ref 101–111)
GFR, EST AFRICAN AMERICAN: 22 mL/min — AB (ref >60)
GFR, EST NON AFRICAN AMERICAN: 19 mL/min — AB (ref >60)
Glucose, Bld: 245 mg/dL — ABNORMAL HIGH (ref 65–99)
Potassium: 3.3 mmol/L — ABNORMAL LOW (ref 3.5–5.1)
SODIUM: 146 mmol/L — AB (ref 135–145)
TOTAL PROTEIN: 6.2 g/dL — AB (ref 6.5–8.1)

## 2017-05-31 MED ORDER — SALINE SPRAY 0.65 % NA SOLN
2.0000 | NASAL | Status: DC | PRN
  Filled 2017-05-31: qty 44

## 2017-05-31 MED ORDER — POLYETHYLENE GLYCOL 3350 17 G PO PACK
17.0000 g | PACK | Freq: Two times a day (BID) | ORAL | Status: DC
  Filled 2017-05-31: qty 1

## 2017-05-31 MED ORDER — SORBITOL 70 % SOLN: 30.0000 mL | Freq: Every day | Status: DC | PRN

## 2017-05-31 MED ORDER — DORZOLAMIDE HCL 2 % OP SOLN
1.0000 [drp] | Freq: Three times a day (TID) | OPHTHALMIC | Status: DC
  Administered 2017-05-31 – 2017-06-03 (×10): 1 [drp] via OPHTHALMIC
  Filled 2017-05-31: qty 10

## 2017-05-31 MED ORDER — LATANOPROST 0.005 % OP SOLN
1.0000 [drp] | Freq: Every day | OPHTHALMIC | Status: DC
  Administered 2017-05-31 – 2017-06-03 (×3): 1 [drp] via OPHTHALMIC
  Filled 2017-05-31: qty 2.5

## 2017-05-31 MED ORDER — POLYETHYLENE GLYCOL 3350 17 GM/SCOOP PO POWD
17.0000 g | Freq: Two times a day (BID) | ORAL | Status: DC
  Filled 2017-05-31: qty 255

## 2017-05-31 MED ORDER — SORBITOL 70 % PO SOLN
30.0000 mL | Freq: Every day | ORAL | Status: DC | PRN
  Filled 2017-05-31: qty 30

## 2017-05-31 MED ORDER — INSULIN ASPART 100 UNIT/ML ~~LOC~~ SOLN
0.0000 [IU] | Freq: Three times a day (TID) | SUBCUTANEOUS | Status: DC
  Administered 2017-05-31: 1 [IU] via SUBCUTANEOUS
  Administered 2017-05-31: 2 [IU] via SUBCUTANEOUS
  Administered 2017-06-01 – 2017-06-04 (×7): 1 [IU] via SUBCUTANEOUS

## 2017-05-31 MED ORDER — HYDRALAZINE HCL 20 MG/ML IJ SOLN
5.0000 mg | Freq: Four times a day (QID) | INTRAMUSCULAR | Status: AC | PRN
Start: 2017-05-31 — End: 2017-06-01
  Administered 2017-06-01 (×2): 5 mg via INTRAVENOUS
  Filled 2017-05-31 (×2): qty 1

## 2017-05-31 MED ORDER — DOCUSATE SODIUM 100 MG PO CAPS
200.0000 mg | ORAL_CAPSULE | Freq: Two times a day (BID) | ORAL | Status: DC
  Filled 2017-05-31: qty 2

## 2017-05-31 MED ORDER — POTASSIUM CHLORIDE CRYS ER 20 MEQ PO TBCR: 40.0000 meq | EXTENDED_RELEASE_TABLET | Freq: Once | ORAL | Status: DC

## 2017-05-31 MED ORDER — SODIUM CHLORIDE 0.45 % IV SOLN
INTRAVENOUS | Status: DC
  Administered 2017-05-31 – 2017-06-01 (×2): via INTRAVENOUS

## 2017-05-31 NOTE — Progress Notes (Signed)
PROGRESS NOTE    Allison Rodgers  DPO:[id number] DOB: 1924/04/25 DOA: 05/30/2017 PCP: Sande Brothers, MD    Brief Narrative: 83 year old female recently discharged from the hospital with diverticulosis with evidence of diverticulitis on Cipro and Flagyl.  She was also diagnosed to have ileus and constipation.  He was readmitted 8 days later with increasing shortness of breath and CT scan of the chest done in the emergency room showed left lower lobe pneumonia and a developing ileus.  He did not show any evidence of obstruction.  At the time of admission she was noted to be dehydrated hyponatremic with a sodium of 147 much elevated creatinine 2.39 from the time of admission her creatinine was 1.18.  His blood pressure was also elevated.  But she does have a habit of not taking her medications she continues to spit out her medications during her last stay in the hospital.  This was discussed with her son who is her POA prior to discharge.   Assessment & Plan:   Principal Problem:   Pneumonia Active Problems:   Diabetes mellitus type 2, controlled ([place])   Dementia   AKI (acute kidney injury) ([place])   Protein-calorie malnutrition ([facility])   Ileus ([facility])   Hypernatremia  LLL HCAP/leukocytosis Ileus/cpnstipation Dehydration/hyponatremia Acute on chronic kidney disease Type 2 diabetes Dementia Anemia of chronic disease   PLAN-continue vancomycin and cefepime for now add SVNs with albuterol.  Oxygen to keep her saturation above 90%.  Follow-up KUB tomorrow n.p.o. continue IV hydration.  Patient has history of dementia with behavioral disturbances including refusing to take her medications.  Closely monitor renal functions as patient is also on vancomycin pharmacy following vancomycin levels. DVT prophylaxis: lovenox Code Status: full Family Communication: none present Disposition Plan:   tbd Consultants:  none Procedures:   Antimicrobial  vanco/cefepime  Subjective: resting Objective: Vitals:   05/30/17 1700 05/30/17 1757 05/30/17 2103 05/31/17 0501  BP: (!) 168/67 (!) 157/87 (!) 149/78 (!) 142/69  Pulse: 91 97 89 86  Resp: 20 20 18 18   Temp:  98.3 F (36.8 C) 98.6 F (37 C) 98.3 F (36.8 C)  TempSrc:  Axillary Axillary Axillary  SpO2: 100% 100% 100% 98%  Height:  5' (1.524 m)     No intake or output data in the 24 hours ending 05/31/17 1057 There were no vitals filed for this visit.  Examination:  General exam: Appears calm and comfortable  Respiratory system: Clear to auscultation. Respiratory effort normal. Cardiovascular system: S1 & S2 heard, RRR. No JVD, murmurs, rubs, gallops or clicks. No pedal edema. Gastrointestinal system: Abdomen is nondistended, soft and nontender. No organomegaly or masses felt. Normal bowel sounds heard. Central nervous system: Alert and oriented. No focal neurological deficits. Extremities: Symmetric 5 x 5 power. Skin: No rashes, lesions or ulcers Psychiatry: Judgement and insight appear normal. Mood & affect appropriate.     Data Reviewed: I have personally reviewed following labs and imaging studies  CBC:  Recent Labs Lab 05/24/17 1524 05/25/17 0047 05/26/17 0858 05/28/17 0349 05/30/17 0918 05/31/17 0344  WBC 7.7 5.1 5.2 7.9 13.8* 13.5*  NEUTROABS 5.3  --   --   --  12.2*  --   HGB 11.1* 12.0 11.8* 12.5 11.2* 10.4*  HCT 36.1 37.8 38.1 38.8 35.9* 34.1*  MCV 76.5* 76.1* 75.7* 74.8* 76.2* 77.0*  PLT 172 141* 142* 172 207 431   Basic Metabolic Panel:  Recent Labs Lab 05/27/17 0357 05/28/17 0349 05/29/17 0401 05/30/17 5400 05/31/17 0344  NA 142 142 146* 147* 146*  K 3.5 3.2* 2.9* 3.7 3.3*  CL 112* 110 114* 113* 116*  CO2 24 21* 23 24 23   GLUCOSE 123* 172* 158* 223* 245*  BUN 17 18 28* 56* 61*  CREATININE 1.62* 1.64* 1.80* 2.39* 2.12*  CALCIUM 8.7* 8.7* 8.8* 9.2 8.8*  MG 1.7  --   --   --   --    GFR: Estimated Creatinine Clearance: 15.9 mL/min (A)  (by C-G formula based on SCr of 2.12 mg/dL (H)). Liver Function Tests:  Recent Labs Lab 05/24/17 1630 05/25/17 0047 05/30/17 0918 05/31/17 0344  AST 38 23 19 14*  ALT 15 14 17 14   ALKPHOS 86 84 65 58  BILITOT 0.7 0.6 0.8 0.6  PROT 7.1 6.8 6.9 6.2*  ALBUMIN 3.2* 3.1* 3.3* 2.9*    Recent Labs Lab 05/30/17 0918  LIPASE 16    Recent Labs Lab 05/24/17 1632 05/30/17 0918  AMMONIA 20 17   Coagulation Profile: No results for input(s): INR, PROTIME in the last 168 hours. Cardiac Enzymes:  Recent Labs Lab 05/25/17 0047 05/30/17 0918  TROPONINI <0.03 0.04*   BNP (last 3 results) No results for input(s): PROBNP in the last 8760 hours. HbA1C: No results for input(s): HGBA1C in the last 72 hours. CBG:  Recent Labs Lab 05/28/17 1746 05/28/17 2238 05/29/17 0740 05/29/17 1226 05/30/17 2134  GLUCAP 168* 157* 170* 195* 246*   Lipid Profile: No results for input(s): CHOL, HDL, LDLCALC, TRIG, CHOLHDL, LDLDIRECT in the last 72 hours. Thyroid Function Tests: No results for input(s): TSH, T4TOTAL, FREET4, T3FREE, THYROIDAB in the last 72 hours. Anemia Panel: No results for input(s): VITAMINB12, FOLATE, FERRITIN, TIBC, IRON, RETICCTPCT in the last 72 hours. Sepsis Labs:  Recent Labs Lab 05/24/17 1736  LATICACIDVEN 1.09    Recent Results (from the past 240 hour(s))  MRSA PCR Screening     Status: None   Collection Time: 05/25/17 12:25 AM  Result Value Ref Range Status   MRSA by PCR NEGATIVE NEGATIVE Final    Comment:        The GeneXpert MRSA Assay (FDA approved for NASAL specimens only), is one component of a comprehensive MRSA colonization surveillance program. It is not intended to diagnose MRSA infection nor to guide or monitor treatment for MRSA infections.   Respiratory Panel by PCR     Status: None   Collection Time: 05/30/17  5:29 PM  Result Value Ref Range Status   Adenovirus NOT DETECTED NOT DETECTED Final   Coronavirus 229E NOT DETECTED NOT  DETECTED Final   Coronavirus HKU1 NOT DETECTED NOT DETECTED Final   Coronavirus NL63 NOT DETECTED NOT DETECTED Final   Coronavirus OC43 NOT DETECTED NOT DETECTED Final   Metapneumovirus NOT DETECTED NOT DETECTED Final   Rhinovirus / Enterovirus NOT DETECTED NOT DETECTED Final   Influenza A NOT DETECTED NOT DETECTED Final   Influenza B NOT DETECTED NOT DETECTED Final   Parainfluenza Virus 1 NOT DETECTED NOT DETECTED Final   Parainfluenza Virus 2 NOT DETECTED NOT DETECTED Final   Parainfluenza Virus 3 NOT DETECTED NOT DETECTED Final   Parainfluenza Virus 4 NOT DETECTED NOT DETECTED Final   Respiratory Syncytial Virus NOT DETECTED NOT DETECTED Final   Bordetella pertussis NOT DETECTED NOT DETECTED Final   Chlamydophila pneumoniae NOT DETECTED NOT DETECTED Final   Mycoplasma pneumoniae NOT DETECTED NOT DETECTED Final    Comment: Performed at [facility], [place] [address] [place], [place] [address]  Radiology Studies: Ct Abdomen Pelvis Wo Contrast  Result Date: 05/30/2017 CLINICAL DATA:  Abdominal distention with nausea and abdominal pain. EXAM: CT CHEST, ABDOMEN AND PELVIS WITHOUT CONTRAST TECHNIQUE: Multidetector CT imaging of the chest, abdomen and pelvis was performed following the standard protocol without IV contrast. COMPARISON:  Abdomen and pelvis CT 05/27/2017. FINDINGS: CT CHEST FINDINGS Cardiovascular: Heart size is increased. No pericardial effusion. Atherosclerotic calcification is noted in the wall of the thoracic aorta. Mediastinum/Nodes: No mediastinal lymphadenopathy. No evidence for gross hilar lymphadenopathy although assessment is limited by the lack of intravenous contrast on today's study. Fluid noted in the distal esophagus, likely related to reflux. There is no axillary lymphadenopathy. Lungs/Pleura: Evaluation of lung parenchyma degraded by breathing motion. Ill-defined ground-glass nodules are identified in the left upper lung with more confluent  consolidative change in the posterior left upper lobe. This consolidative change tracks into the left hilum in there is associated prominent left lower lobe consolidation with air bronchograms. Small bilateral pleural effusions are evident. Musculoskeletal: Bone windows reveal no worrisome lytic or sclerotic osseous lesions. CT ABDOMEN PELVIS FINDINGS Hepatobiliary: No focal abnormality in the liver on this study without intravenous contrast. Gallbladder surgically absent. No intrahepatic or extrahepatic biliary dilation. Pancreas: Pancreas diffusely atrophic. Spleen: No splenomegaly. No focal mass lesion. Adrenals/Urinary Tract: No adrenal nodule or mass. Right kidney unremarkable. 12 mm hypoattenuating lesion in the left kidney is stable and likely a cyst. No hydroureteronephrosis. Urinary bladder is nondistended. Stomach/Bowel: Stomach is markedly distended with fluid. Duodenum unremarkable. Small bowel loops in the central abdomen are fluid-filled and distended up to about 3 cm maximum diameter. Fluid is seen in the terminal ileum and the right colon is distended and fluid-filled. Fluid extends distally into the colon is for as the sigmoid segment were there is wall thickening and diverticular change. Fluid also noted in the rectum. Vascular/Lymphatic: There is abdominal aortic atherosclerosis without aneurysm. There is no gastrohepatic or hepatoduodenal ligament lymphadenopathy. No intraperitoneal or retroperitoneal lymphadenopathy. No pelvic sidewall lymphadenopathy. Reproductive: Uterus surgically absent.  There is no adnexal mass. Other: No substantial intraperitoneal free fluid. Musculoskeletal: Diffuse body wall edema noted. Bone windows reveal no worrisome lytic or sclerotic osseous lesions. IMPRESSION: 1. Left lower lobe consolidation compatible with pneumonia. There is consolidative change in the posterior left upper lobe with scattered ground-glass nodules in the left upper lung. This may represent an  associated component of multifocal pneumonia. 2. Small bilateral pleural effusions. 3. Distended fluid-filled stomach is associated with distended fluid-filled small bowel and colon. In given that the distended fluid-filled colon extends distally to the sigmoid segment, imaging features are not suggestive of small-bowel obstruction. There is prominent diverticular change in the sigmoid colon and there may be an obstructing lesion or stenosis at this level resulting in the diffuse small bowel and colonic distention. Small amount of fluid is seen in the rectum, distal to the sigmoid segment. 4. Diffuse body wall edema. 5.  Aortic Atherosclerois (ICD10-170.0) Electronically Signed   By: Misty Stanley M.D.   On: 05/30/2017 12:40   Ct Chest Wo Contrast  Result Date: 05/30/2017 CLINICAL DATA:  Abdominal distention with nausea and abdominal pain. EXAM: CT CHEST, ABDOMEN AND PELVIS WITHOUT CONTRAST TECHNIQUE: Multidetector CT imaging of the chest, abdomen and pelvis was performed following the standard protocol without IV contrast. COMPARISON:  Abdomen and pelvis CT 05/27/2017. FINDINGS: CT CHEST FINDINGS Cardiovascular: Heart size is increased. No pericardial effusion. Atherosclerotic calcification is noted in the wall of the thoracic aorta. Mediastinum/Nodes: No  mediastinal lymphadenopathy. No evidence for gross hilar lymphadenopathy although assessment is limited by the lack of intravenous contrast on today's study. Fluid noted in the distal esophagus, likely related to reflux. There is no axillary lymphadenopathy. Lungs/Pleura: Evaluation of lung parenchyma degraded by breathing motion. Ill-defined ground-glass nodules are identified in the left upper lung with more confluent consolidative change in the posterior left upper lobe. This consolidative change tracks into the left hilum in there is associated prominent left lower lobe consolidation with air bronchograms. Small bilateral pleural effusions are evident.  Musculoskeletal: Bone windows reveal no worrisome lytic or sclerotic osseous lesions. CT ABDOMEN PELVIS FINDINGS Hepatobiliary: No focal abnormality in the liver on this study without intravenous contrast. Gallbladder surgically absent. No intrahepatic or extrahepatic biliary dilation. Pancreas: Pancreas diffusely atrophic. Spleen: No splenomegaly. No focal mass lesion. Adrenals/Urinary Tract: No adrenal nodule or mass. Right kidney unremarkable. 12 mm hypoattenuating lesion in the left kidney is stable and likely a cyst. No hydroureteronephrosis. Urinary bladder is nondistended. Stomach/Bowel: Stomach is markedly distended with fluid. Duodenum unremarkable. Small bowel loops in the central abdomen are fluid-filled and distended up to about 3 cm maximum diameter. Fluid is seen in the terminal ileum and the right colon is distended and fluid-filled. Fluid extends distally into the colon is for as the sigmoid segment were there is wall thickening and diverticular change. Fluid also noted in the rectum. Vascular/Lymphatic: There is abdominal aortic atherosclerosis without aneurysm. There is no gastrohepatic or hepatoduodenal ligament lymphadenopathy. No intraperitoneal or retroperitoneal lymphadenopathy. No pelvic sidewall lymphadenopathy. Reproductive: Uterus surgically absent.  There is no adnexal mass. Other: No substantial intraperitoneal free fluid. Musculoskeletal: Diffuse body wall edema noted. Bone windows reveal no worrisome lytic or sclerotic osseous lesions. IMPRESSION: 1. Left lower lobe consolidation compatible with pneumonia. There is consolidative change in the posterior left upper lobe with scattered ground-glass nodules in the left upper lung. This may represent an associated component of multifocal pneumonia. 2. Small bilateral pleural effusions. 3. Distended fluid-filled stomach is associated with distended fluid-filled small bowel and colon. In given that the distended fluid-filled colon extends  distally to the sigmoid segment, imaging features are not suggestive of small-bowel obstruction. There is prominent diverticular change in the sigmoid colon and there may be an obstructing lesion or stenosis at this level resulting in the diffuse small bowel and colonic distention. Small amount of fluid is seen in the rectum, distal to the sigmoid segment. 4. Diffuse body wall edema. 5.  Aortic Atherosclerois (ICD10-170.0) Electronically Signed   By: Misty Stanley M.D.   On: 05/30/2017 12:40   Dg Abdomen Acute W/chest  Result Date: 05/30/2017 CLINICAL DATA:  Shortness of breath . EXAM: DG ABDOMEN ACUTE W/ 1V CHEST COMPARISON:  CT 05/27/2017.  Abdominal series 1016 2018. FINDINGS: Chest x-ray reveals left lower lobe atelectasis and consolidation. Small left pleural effusion. Surgical clips right upper quadrant. Multiple loops of distended small bowel are noted. There is a paucity of colonic gas. Findings consistent with small-bowel obstruction. IMPRESSION: 1. Multiple dilated loops of small bowel with paucity of colonic gas noted on today's exam. Findings suggest small bowel obstruction. Follow-up exam suggested to demonstrate resolution. No free air identified. 2. Left lower lobe atelectasis and consolidation. Small left pleural effusion. Electronically Signed   By: Marcello Moores  Register   On: 05/30/2017 10:21        Scheduled Meds: . amLODipine  10 mg Oral Daily  . enoxaparin (LOVENOX) injection  30 mg Subcutaneous Q24H  . hydrALAZINE  50  mg Oral Q8H  . insulin aspart  0-9 Units Subcutaneous TID WC   Continuous Infusions: . sodium chloride    . ceFEPime (MAXIPIME) IV    . dextrose 50 mL/hr at 05/30/17 1833  . [START ON 06/01/2017] vancomycin       LOS: 1 day      Georgette Shell, MD [facility]   If 7PM-7AM, please contact night-coverage [email or web address] Password [email or web address] 05/31/2017, 10:57 AM

## 2017-06-01 ENCOUNTER — Inpatient Hospital Stay (HOSPITAL_COMMUNITY): Payer: Medicare Other

## 2017-06-01 LAB — GLUCOSE, CAPILLARY
GLUCOSE-CAPILLARY: 130 mg/dL — AB (ref 65–99)
GLUCOSE-CAPILLARY: 112 mg/dL — AB (ref 65–99)
Glucose-Capillary: 117 mg/dL — ABNORMAL HIGH (ref 65–99)
Glucose-Capillary: 114 mg/dL — ABNORMAL HIGH (ref 65–99)

## 2017-06-01 LAB — BASIC METABOLIC PANEL
Anion gap: 7 (ref 5–15)
Anion gap: 8 (ref 5–15)
BUN: 50 mg/dL — AB (ref 6–20)
BUN: 51 mg/dL — ABNORMAL HIGH (ref 6–20)
CALCIUM: 8.7 mg/dL — AB (ref 8.9–10.3)
CO2: 22 mmol/L (ref 22–32)
CO2: 21 mmol/L — ABNORMAL LOW (ref 22–32)
CREATININE: 1.78 mg/dL — AB (ref 0.44–1.00)
Calcium: 8.9 mg/dL (ref 8.9–10.3)
Chloride: 113 mmol/L — ABNORMAL HIGH (ref 101–111)
Chloride: 111 mmol/L (ref 101–111)
Creatinine, Ser: 1.74 mg/dL — ABNORMAL HIGH (ref 0.44–1.00)
GFR calc Af Amer: 28 mL/min — ABNORMAL LOW (ref >60)
GFR, EST AFRICAN AMERICAN: 28 mL/min — AB (ref >60)
GFR, EST NON AFRICAN AMERICAN: 24 mL/min — AB (ref >60)
GFR, EST NON AFRICAN AMERICAN: 24 mL/min — AB (ref >60)
Glucose, Bld: 118 mg/dL — ABNORMAL HIGH (ref 65–99)
Glucose, Bld: 135 mg/dL — ABNORMAL HIGH (ref 65–99)
POTASSIUM: 4.1 mmol/L (ref 3.5–5.1)
Potassium: 3.7 mmol/L (ref 3.5–5.1)
SODIUM: 142 mmol/L (ref 135–145)
SODIUM: 140 mmol/L (ref 135–145)

## 2017-06-01 LAB — CBC
HCT: 34.2 % — ABNORMAL LOW (ref 36.0–46.0)
HEMOGLOBIN: 10.5 g/dL — AB (ref 12.0–15.0)
MCH: 23.8 pg — AB (ref 26.0–34.0)
MCHC: 30.7 g/dL (ref 30.0–36.0)
MCV: 77.4 fL — ABNORMAL LOW (ref 78.0–100.0)
PLATELETS: 210 10*3/uL (ref 150–400)
RBC: 4.42 MIL/uL (ref 3.87–5.11)
RDW: 15.6 % — AB (ref 11.5–15.5)
WBC: 11.3 10*3/uL — ABNORMAL HIGH (ref 4.0–10.5)

## 2017-06-01 LAB — STREP PNEUMONIAE URINARY ANTIGEN: Strep Pneumo Urinary Antigen: NEGATIVE

## 2017-06-01 NOTE — Progress Notes (Signed)
PROGRESS NOTE    Allison Rodgers  GUY:[id number] DOB: 1924-07-04 DOA: 05/30/2017 PCP: Sande Brothers, MD  Brief Narrative:  83 year old female recently discharged from the hospital with diverticulosis with evidence of diverticulitis on Cipro and Flagyl.  She was also diagnosed to have ileus and constipation.  He was readmitted a day later with increasing shortness of breath and CT scan of the chest done in the emergency room showed left lower lobe pneumonia and a developing ileus. it did not show any evidence of obstruction.  At the time of admission she was noted to be dehydrated hyernatremic with a sodium of 147 much elevated creatinine 2.39 from the time of admission her creatinine was 1.18.  His blood pressure was also elevated.  But she does have a habit of not taking her medications she continues to spit out her medications during her last stay in the hospital.  This was discussed with her son who is her POA prior to discharge. dw son again today.he wants everything done as of now.patient continues to be npo due to ileus.    Assessment & Plan:   Principal Problem:   Pneumonia Active Problems:   Diabetes mellitus type 2, controlled ([place])   Dementia   AKI (acute kidney injury) ([place])   Protein-calorie malnutrition (HCC)   Ileus (HCC)   Hypernatremia    LLL HCAP/leukocytosis on cefepime Ileus/constipation KUB showing improvement in ileus. Dehydration/hyernatremia continue IV hydration Acute on chronic kidney disease better Type 2 diabetes takes Lantus prior to admission which is on hold at this time Dementia with behavioral disturbances spitting out meds. Anemia of chronic disease stable    DVT prophylaxis: lovenox Code Status: Full code Family Communication:dw son Disposition Plan:   Dc to snf when ready.will dw son about palliative care. Consultants:   Procedures: Antimicrobials: Cefepime Subjective: Sleeping  Objective: Vitals:   05/31/17 2121 06/01/17 0548  06/01/17 0702 06/01/17 1303  BP: (!) 156/70 (!) 188/71 (!) 186/72 (!) 182/79  Pulse: 87  86 96  Resp: 18  18 18   Temp: 98.2 F (36.8 C)  98.1 F (36.7 C) 97.6 F (36.4 C)  TempSrc: Axillary  Axillary Axillary  SpO2: 100%  100% 100%  Height:        Intake/Output Summary (Last 24 hours) at 06/01/17 1321 Last data filed at 06/01/17 0318  Gross per 24 hour  Intake           3697.5 ml  Output                0 ml  Net           3697.5 ml   There were no vitals filed for this visit.  Examination:  General exam: Appears calm and comfortable  Respiratory system: Clear to auscultation. Respiratory effort normal. Cardiovascular system: S1 & S2 heard, RRR. No JVD, murmurs, rubs, gallops or clicks. No pedal edema. Gastrointestinal system: Abdomen is nondistended, soft and nontender. No organomegaly or masses felt. Normal bowel sounds heard. Central nervous system: Alert and oriented. No focal neurological deficits. Extremities: Symmetric 5 x 5 power. Skin: No rashes, lesions or ulcers Psychiatry: Judgement and insight appear normal. Mood & affect appropriate.     Data Reviewed: I have personally reviewed following labs and imaging studies  CBC:  Recent Labs Lab 05/26/17 0858 05/28/17 0349 05/30/17 0918 05/31/17 0344 06/01/17 0412  WBC 5.2 7.9 13.8* 13.5* 11.3*  NEUTROABS  --   --  12.2*  --   --  HGB 11.8* 12.5 11.2* 10.4* 10.5*  HCT 38.1 38.8 35.9* 34.1* 34.2*  MCV 75.7* 74.8* 76.2* 77.0* 77.4*  PLT 142* 172 207 181 952   Basic Metabolic Panel:  Recent Labs Lab 05/27/17 0357 05/28/17 0349 05/29/17 0401 05/30/17 0918 05/31/17 0344 06/01/17 0412  NA 142 142 146* 147* 146* 142  K 3.5 3.2* 2.9* 3.7 3.3* 3.7  CL 112* 110 114* 113* 116* 113*  CO2 24 21* 23 24 23 22   GLUCOSE 123* 172* 158* 223* 245* 118*  BUN 17 18 28* 56* 61* 50*  CREATININE 1.62* 1.64* 1.80* 2.39* 2.12* 1.78*  CALCIUM 8.7* 8.7* 8.8* 9.2 8.8* 8.7*  MG 1.7  --   --   --   --   --     GFR: Estimated Creatinine Clearance: 18.9 mL/min (A) (by C-G formula based on SCr of 1.78 mg/dL (H)). Liver Function Tests:  Recent Labs Lab 05/30/17 0918 05/31/17 0344  AST 19 14*  ALT 17 14  ALKPHOS 65 58  BILITOT 0.8 0.6  PROT 6.9 6.2*  ALBUMIN 3.3* 2.9*    Recent Labs Lab 05/30/17 0918  LIPASE 16    Recent Labs Lab 05/30/17 0918  AMMONIA 17   Coagulation Profile: No results for input(s): INR, PROTIME in the last 168 hours. Cardiac Enzymes:  Recent Labs Lab 05/30/17 0918  TROPONINI 0.04*   BNP (last 3 results) No results for input(s): PROBNP in the last 8760 hours. HbA1C: No results for input(s): HGBA1C in the last 72 hours. CBG:  Recent Labs Lab 05/31/17 1222 05/31/17 1737 05/31/17 2125 06/01/17 0811 06/01/17 1141  GLUCAP 188* 143* 126* 117* 114*   Lipid Profile: No results for input(s): CHOL, HDL, LDLCALC, TRIG, CHOLHDL, LDLDIRECT in the last 72 hours. Thyroid Function Tests: No results for input(s): TSH, T4TOTAL, FREET4, T3FREE, THYROIDAB in the last 72 hours. Anemia Panel: No results for input(s): VITAMINB12, FOLATE, FERRITIN, TIBC, IRON, RETICCTPCT in the last 72 hours. Sepsis Labs: No results for input(s): PROCALCITON, LATICACIDVEN in the last 168 hours.  Recent Results (from the past 240 hour(s))  MRSA PCR Screening     Status: None   Collection Time: 05/25/17 12:25 AM  Result Value Ref Range Status   MRSA by PCR NEGATIVE NEGATIVE Final    Comment:        The GeneXpert MRSA Assay (FDA approved for NASAL specimens only), is one component of a comprehensive MRSA colonization surveillance program. It is not intended to diagnose MRSA infection nor to guide or monitor treatment for MRSA infections.   Respiratory Panel by PCR     Status: None   Collection Time: 05/30/17  5:29 PM  Result Value Ref Range Status   Adenovirus NOT DETECTED NOT DETECTED Final   Coronavirus 229E NOT DETECTED NOT DETECTED Final   Coronavirus HKU1 NOT  DETECTED NOT DETECTED Final   Coronavirus NL63 NOT DETECTED NOT DETECTED Final   Coronavirus OC43 NOT DETECTED NOT DETECTED Final   Metapneumovirus NOT DETECTED NOT DETECTED Final   Rhinovirus / Enterovirus NOT DETECTED NOT DETECTED Final   Influenza A NOT DETECTED NOT DETECTED Final   Influenza B NOT DETECTED NOT DETECTED Final   Parainfluenza Virus 1 NOT DETECTED NOT DETECTED Final   Parainfluenza Virus 2 NOT DETECTED NOT DETECTED Final   Parainfluenza Virus 3 NOT DETECTED NOT DETECTED Final   Parainfluenza Virus 4 NOT DETECTED NOT DETECTED Final   Respiratory Syncytial Virus NOT DETECTED NOT DETECTED Final   Bordetella pertussis NOT DETECTED NOT DETECTED  Final   Chlamydophila pneumoniae NOT DETECTED NOT DETECTED Final   Mycoplasma pneumoniae NOT DETECTED NOT DETECTED Final    Comment: Performed at [facility], [place] [address] [place], [place] [address]         Radiology Studies: Dg Abd 1 View  Result Date: 06/01/2017 CLINICAL DATA:  Ileus EXAM: ABDOMEN - 1 VIEW COMPARISON:  05/30/2017 CT and radiographs. FINDINGS: Mildly distended gas-filled loops of small bowel and colon are noted, with decrease distension of small bowel loops. No other significant changes are identified. IMPRESSION: Decreased distension of small bowel loops/ileus. Electronically Signed   By: Margarette Canada M.D.   On: 06/01/2017 07:19        Scheduled Meds: . amLODipine  10 mg Oral Daily  . docusate sodium  200 mg Oral BID  . dorzolamide  1 drop Both Eyes TID  . enoxaparin (LOVENOX) injection  30 mg Subcutaneous Q24H  . hydrALAZINE  50 mg Oral Q8H  . insulin aspart  0-9 Units Subcutaneous TID WC  . latanoprost  1 drop Both Eyes QHS  . polyethylene glycol  17 g Oral BID  . potassium chloride  40 mEq Oral Once   Continuous Infusions: . sodium chloride 150 mL/hr at 06/01/17 1246  . ceFEPime (MAXIPIME) IV 1 g (06/01/17 1246)  . dextrose 50 mL/hr at 05/30/17 1833     LOS: 2 days       Georgette Shell, MD [facility]  If 7PM-7AM, please contact night-coverage [email or web address] Password [email or web address] 06/01/2017, 1:21 PM

## 2017-06-02 ENCOUNTER — Inpatient Hospital Stay (HOSPITAL_COMMUNITY): Payer: Medicare Other

## 2017-06-02 ENCOUNTER — Encounter (HOSPITAL_COMMUNITY): Payer: Self-pay | Admitting: Radiology

## 2017-06-02 LAB — GLUCOSE, CAPILLARY
GLUCOSE-CAPILLARY: 130 mg/dL — AB (ref 65–99)
Glucose-Capillary: 125 mg/dL — ABNORMAL HIGH (ref 65–99)
Glucose-Capillary: 129 mg/dL — ABNORMAL HIGH (ref 65–99)
Glucose-Capillary: 131 mg/dL — ABNORMAL HIGH (ref 65–99)

## 2017-06-02 LAB — LEGIONELLA PNEUMOPHILA SEROGP 1 UR AG: L. PNEUMOPHILA SEROGP 1 UR AG: NEGATIVE

## 2017-06-02 MED ORDER — HYDRALAZINE HCL 20 MG/ML IJ SOLN: 5.0000 mg | Freq: Four times a day (QID) | INTRAMUSCULAR | Status: DC | PRN

## 2017-06-02 MED ORDER — IOPAMIDOL (ISOVUE-300) INJECTION 61%
INTRAVENOUS | Status: AC
  Administered 2017-06-02: 30 mL via ORAL
  Filled 2017-06-02: qty 30

## 2017-06-02 MED ORDER — FUROSEMIDE 10 MG/ML IJ SOLN
INTRAMUSCULAR | Status: AC
  Filled 2017-06-02: qty 4

## 2017-06-02 MED ORDER — IOPAMIDOL (ISOVUE-300) INJECTION 61%
30.0000 mL | Freq: Once | INTRAVENOUS | Status: AC | PRN
  Administered 2017-06-02: 30 mL via ORAL

## 2017-06-02 MED ORDER — FUROSEMIDE 10 MG/ML IJ SOLN
40.0000 mg | Freq: Once | INTRAMUSCULAR | Status: AC
Start: 2017-06-02 — End: 2017-06-02
  Administered 2017-06-02: 40 mg via INTRAVENOUS

## 2017-06-02 MED ORDER — HYDRALAZINE HCL 20 MG/ML IJ SOLN
5.0000 mg | Freq: Four times a day (QID) | INTRAMUSCULAR | Status: DC | PRN
  Administered 2017-06-02 – 2017-06-03 (×2): 5 mg via INTRAVENOUS
  Filled 2017-06-02 (×2): qty 1

## 2017-06-02 NOTE — Progress Notes (Signed)
LCSW following for disposition of needs: patient is from [facility]  Patient has had a change in level of care with the NG tube being placed for feeding. If patient is discharged with the NG tube, she will not be able to return to [facility].  This was confirmed with Deanna at the facility who is an Scientist, physiological.    LCSW has discussed this barrier with MD.  Patient is not ready for discharge at this time as she is planning to have CT scans completed.  Hospice has been offered and information given to the son who only wants her to go back to [facility]  He has been given information about [facility].  LCSW is awaiting call back confirming plans and obtaining more information regarding if patient returns.  Plan for discharge remains pending, but anticipating return to ALF.  Lane Hacker, MSW Clinical Social Work: [employer] Coverage for :  [phone]

## 2017-06-02 NOTE — Progress Notes (Signed)
PROGRESS NOTE    Allison Rodgers  RXV:[id number] DOB: June 04, 1924 DOA: 05/30/2017 PCP: Sande Brothers, MD   Brief Narrative: 83 year old female recently discharged from the hospital with diverticulosis with evidence of diverticulitis on Cipro and Flagyl.She was also diagnosed to have ileus and constipation. He was readmitted a day later with increasing shortness of breath and CT scan of the chest done in the emergency room showed left lower lobe pneumonia and a developing ileus. it did not show any evidence of obstruction. At the time of admission she was noted to be dehydrated hyernatremic with a sodium of 147 much elevated creatinine 2.39 from the time of admission her creatinine was 1.18. His blood pressure was also elevated. But she does have a habit of not taking her medications she continues to spit out her medications during her last stay in the hospital. This was discussed with her son who is her POA prior to discharge. dw son again today.he wants everything done as of now.patient continues to be npo due to ileus.  Overnight patient started vomiting what looks like stool so an NG tube was placed and had a lot of stool-like material sucked out.  A stat CT of the abdomen is ordered which is pending at this time.  Looking back at the last CAT scan from the 19th shows prominent ileus may be mild obstruction she has had multiple CT scans of her abdomen and pelvis.  Which has not shown any signs of obstruction so far.  This was discussed in detail with the patient's son and daughter-in-law.  Patient also seems to have aspirated with new right upper lobe infiltrates.  Patients family has already set up for palliative care and hospice in place [facility]  But he did not want me to consult them here his plan is to take his mother back to the nursing home and have her seen by palliative care at the nursing home.  However he wants to have the CT scan  done.    Assessment & Plan:   Principal Problem:   Pneumonia Active Problems:   Diabetes mellitus type 2, controlled ([place])   Dementia   AKI (acute kidney injury) ([place])   Protein-calorie malnutrition ([place])   Ileus (HCC)   Hypernatremia  Multi lobar pneumonia- Ileus versus obstruction Hypernatremia and dehydration Type 2 diabetes Acute on chronic kidney disease stage III Dementia Protein calorie malnutrition  Plan would be to follow up a CT scan of the abdomen pelvis today.  Hoping that family wants to discharge him tomorrow to the nursing home with palliative care.  New current antibiotics for now.  NG tube suction.  DVT prophylaxis:scd Code Status:dnr Family Communication: son  Disposition Plan Dc to nh tomorrow / palliative care Consultants:   Procedures:   Antimicrobials cefepime Subjective:  Resting in bed but unable to follow commands Objective: Vitals:   06/02/17 0434 06/02/17 0605 06/02/17 0619 06/02/17 0629  BP:  (!) 150/61  (!) 153/52  Pulse:    100  Resp:    18  Temp:    98 F (36.7 C)  TempSrc:    Oral  SpO2: 98%   98%  Weight:   75.3 kg (166 lb)   Height:   5' (1.524 m)     Intake/Output Summary (Last 24 hours) at 06/02/17 1322 Last data filed at 06/01/17 2200  Gross per 24 hour  Intake  0 ml  Output             1750 ml  Net            -1750 ml   Filed Weights   06/02/17 0619  Weight: 75.3 kg (166 lb)    Examination:  General exam: Appears calm and comfortable  Respiratory system: Clear to auscultation. Respiratory effort normal. Cardiovascular system: S1 & S2 heard, RRR. No JVD, murmurs, rubs, gallops or clicks. No pedal edema. Gastrointestinal system: Abdomen is distended, soft and nontender. No organomegaly or masses felt. Normal bowel sounds heard. Central nervous system: Alert and oriented. No focal neurological deficits. Extremities: Symmetric 5 x 5 power. Skin: No rashes, lesions or ulcers Psychiatry:  Judgement and insight appear normal. Mood & affect appropriate.     Data Reviewed: I have personally reviewed following labs and imaging studies  CBC:  Recent Labs Lab 05/28/17 0349 05/30/17 0918 05/31/17 0344 06/01/17 0412  WBC 7.9 13.8* 13.5* 11.3*  NEUTROABS  --  12.2*  --   --   HGB 12.5 11.2* 10.4* 10.5*  HCT 38.8 35.9* 34.1* 34.2*  MCV 74.8* 76.2* 77.0* 77.4*  PLT 172 207 181 549   Basic Metabolic Panel:  Recent Labs Lab 05/27/17 0357  05/29/17 0401 05/30/17 0918 05/31/17 0344 06/01/17 0412 06/01/17 1415  NA 142  < > 146* 147* 146* 142 140  K 3.5  < > 2.9* 3.7 3.3* 3.7 4.1  CL 112*  < > 114* 113* 116* 113* 111  CO2 24  < > 23 24 23 22  21*  GLUCOSE 123*  < > 158* 223* 245* 118* 135*  BUN 17  < > 28* 56* 61* 50* 51*  CREATININE 1.62*  < > 1.80* 2.39* 2.12* 1.78* 1.74*  CALCIUM 8.7*  < > 8.8* 9.2 8.8* 8.7* 8.9  MG 1.7  --   --   --   --   --   --   < > = values in this interval not displayed. GFR: Estimated Creatinine Clearance: 19.1 mL/min (A) (by C-G formula based on SCr of 1.74 mg/dL (H)). Liver Function Tests:  Recent Labs Lab 05/30/17 0918 05/31/17 0344  AST 19 14*  ALT 17 14  ALKPHOS 65 58  BILITOT 0.8 0.6  PROT 6.9 6.2*  ALBUMIN 3.3* 2.9*    Recent Labs Lab 05/30/17 0918  LIPASE 16    Recent Labs Lab 05/30/17 0918  AMMONIA 17   Coagulation Profile: No results for input(s): INR, PROTIME in the last 168 hours. Cardiac Enzymes:  Recent Labs Lab 05/30/17 0918  TROPONINI 0.04*   BNP (last 3 results) No results for input(s): PROBNP in the last 8760 hours. HbA1C: No results for input(s): HGBA1C in the last 72 hours. CBG:  Recent Labs Lab 06/01/17 0811 06/01/17 1141 06/01/17 1705 06/01/17 2158 06/02/17 0733  GLUCAP 117* 114* 130* 112* 125*   Lipid Profile: No results for input(s): CHOL, HDL, LDLCALC, TRIG, CHOLHDL, LDLDIRECT in the last 72 hours. Thyroid Function Tests: No results for input(s): TSH, T4TOTAL, FREET4,  T3FREE, THYROIDAB in the last 72 hours. Anemia Panel: No results for input(s): VITAMINB12, FOLATE, FERRITIN, TIBC, IRON, RETICCTPCT in the last 72 hours. Sepsis Labs: No results for input(s): PROCALCITON, LATICACIDVEN in the last 168 hours.  Recent Results (from the past 240 hour(s))  MRSA PCR Screening     Status: None   Collection Time: 05/25/17 12:25 AM  Result Value Ref Range Status   MRSA by PCR NEGATIVE NEGATIVE  Final    Comment:        The GeneXpert MRSA Assay (FDA approved for NASAL specimens only), is one component of a comprehensive MRSA colonization surveillance program. It is not intended to diagnose MRSA infection nor to guide or monitor treatment for MRSA infections.   Respiratory Panel by PCR     Status: None   Collection Time: 05/30/17  5:29 PM  Result Value Ref Range Status   Adenovirus NOT DETECTED NOT DETECTED Final   Coronavirus 229E NOT DETECTED NOT DETECTED Final   Coronavirus HKU1 NOT DETECTED NOT DETECTED Final   Coronavirus NL63 NOT DETECTED NOT DETECTED Final   Coronavirus OC43 NOT DETECTED NOT DETECTED Final   Metapneumovirus NOT DETECTED NOT DETECTED Final   Rhinovirus / Enterovirus NOT DETECTED NOT DETECTED Final   Influenza A NOT DETECTED NOT DETECTED Final   Influenza B NOT DETECTED NOT DETECTED Final   Parainfluenza Virus 1 NOT DETECTED NOT DETECTED Final   Parainfluenza Virus 2 NOT DETECTED NOT DETECTED Final   Parainfluenza Virus 3 NOT DETECTED NOT DETECTED Final   Parainfluenza Virus 4 NOT DETECTED NOT DETECTED Final   Respiratory Syncytial Virus NOT DETECTED NOT DETECTED Final   Bordetella pertussis NOT DETECTED NOT DETECTED Final   Chlamydophila pneumoniae NOT DETECTED NOT DETECTED Final   Mycoplasma pneumoniae NOT DETECTED NOT DETECTED Final    Comment: Performed at [facility], [place] [address], [place], [place] [address]         Radiology Studies: Dg Abd 1 View  Result Date: 06/02/2017 CLINICAL DATA:  Acute onset of  generalized abdominal pain. Initial encounter. EXAM: ABDOMEN - 1 VIEW COMPARISON:  Abdominal radiograph performed 06/01/2017 FINDINGS: The patient's enteric tube is noted coiled at the body of the decompressed stomach. There is distention of small-bowel loops to 3.8 cm in diameter, raising concern for dysmotility, as previously noted. The cecum is distended with air. No free intra-abdominal air is seen, though evaluation for free air is limited on a single supine view. Clips are noted within the right upper quadrant, reflecting prior cholecystectomy. Degenerative change is noted along the lower thoracic and lumbar spine. IMPRESSION: 1. Distention of small-bowel loops to 3.8 cm in diameter, concerning for dysmotility, as previously noted. 2. Enteric tube noted coiled at the body of the decompressed stomach. Electronically Signed   By: Garald Balding M.D.   On: 06/02/2017 01:12   Dg Abd 1 View  Result Date: 06/01/2017 CLINICAL DATA:  Nasogastric tube placement. EXAM: ABDOMEN - 1 VIEW COMPARISON:  Earlier today at 0432 hours. FINDINGS: 1917 hours. Nasogastric tube is looped in the stomach with the tip at the gastric body. Mild left hemidiaphragm elevation. Cholecystectomy clips. Gas-filled small bowel loops measuring up to 3.7 cm currently versus similar on the prior. No gross free intraperitoneal air. Left base consolidation. IMPRESSION: Nasogastric terminating at the body of the stomach. Similar mild to moderate gaseous distension of small bowel loops, suggesting low-grade partial obstruction or ileus. These results will be called to the ordering clinician or representative by the Radiologist Assistant, and communication documented in the PACS or zVision Dashboard. Electronically Signed   By: Abigail Miyamoto M.D.   On: 06/01/2017 20:25   Dg Abd 1 View  Result Date: 06/01/2017 CLINICAL DATA:  Ileus EXAM: ABDOMEN - 1 VIEW COMPARISON:  05/30/2017 CT and radiographs. FINDINGS: Mildly distended gas-filled loops of  small bowel and colon are noted, with decrease distension of small bowel loops. No other significant changes are identified.  IMPRESSION: Decreased distension of small bowel loops/ileus. Electronically Signed   By: Margarette Canada M.D.   On: 06/01/2017 07:19   Dg Chest Port 1 View  Result Date: 06/02/2017 CLINICAL DATA:  Acute onset of bibasilar crackles. Initial encounter. EXAM: PORTABLE CHEST 1 VIEW COMPARISON:  Chest radiograph and CT of the chest performed 05/30/2017 FINDINGS: Right upper lobe and left basilar airspace opacification is concerning for multifocal pneumonia. No definite pleural effusion or pneumothorax is seen. The cardiomediastinal silhouette is borderline normal in size. No acute osseous abnormalities are seen. The patient's enteric tube is noted ending overlying the decompressed stomach. Clips are noted within the right upper quadrant, reflecting prior cholecystectomy. IMPRESSION: 1. Right upper lobe and left basilar airspace opacification, reflecting multifocal pneumonia. 2. Enteric tube noted ending about the decompressed stomach. Electronically Signed   By: Garald Balding M.D.   On: 06/02/2017 01:05   Dg Abd Portable 1v  Result Date: 06/02/2017 CLINICAL DATA:  Initial evaluation for NG tube repositioning. EXAM: PORTABLE ABDOMEN - 1 VIEW COMPARISON:  Prior radiograph from earlier same day. FINDINGS: NG tube is been repositioned, with tip in side hole seen overlying the stomach, beyond the GE junction. Tip projects inferiorly. Scattered dilated gas-filled loops of bowel again noted within the lower abdomen. Left pleural effusion with associated left basilar opacity, likely atelectasis. Right upper lobe opacity noted as well. Findings better evaluated on recent chest radiograph. IMPRESSION: Tip and side hole of enteric tube overlying the stomach, well beyond the GE junction. Tip projects inferiorly. Electronically Signed   By: Jeannine Boga M.D.   On: 06/02/2017 02:27         Scheduled Meds: . amLODipine  10 mg Oral Daily  . docusate sodium  200 mg Oral BID  . dorzolamide  1 drop Both Eyes TID  . enoxaparin (LOVENOX) injection  30 mg Subcutaneous Q24H  . hydrALAZINE  50 mg Oral Q8H  . insulin aspart  0-9 Units Subcutaneous TID WC  . latanoprost  1 drop Both Eyes QHS  . polyethylene glycol  17 g Oral BID  . potassium chloride  40 mEq Oral Once   Continuous Infusions: . sodium chloride 10 mL/hr at 06/02/17 0055  . ceFEPime (MAXIPIME) IV 1 g (06/02/17 1312)     LOS: 3 days        Georgette Shell, MD [facility]   If 7PM-7AM, please contact night-coverage [email or web address] Password [email or web address] 06/02/2017, 1:22 PM

## 2017-06-02 NOTE — Progress Notes (Signed)
Pharmacy Antibiotic Note  Allison Rodgers is a 83 y.o. female admitted on 05/30/2017 with HCAP.  Pharmacy has been consulted for Cefepime dosing.  06/02/2017 Last labs 10/21 WBC 11.3 Scr 1.74, CrCl ~ 19.88mls/min Afebrile -of note pt spitting out most po meds on last admission, currently NPO for ileus Plan:  Continue Cefepime 1g IV q24 hr  PCN intolerance listed as "hallucinations," but has tolerated Rocephin. - f/u SCr closely and monitor for worsening confusion while on beta-lactam tx.   Height: 5' (152.4 cm) Weight: 166 lb (75.3 kg) IBW/kg (Calculated) : 45.5  Temp (24hrs), Avg:97.9 F (36.6 C), Min:97.6 F (36.4 C), Max:98.2 F (36.8 C)   Recent Labs Lab 05/28/17 0349 05/29/17 0401 05/30/17 0918 05/31/17 0344 06/01/17 0412 06/01/17 1415  WBC 7.9  --  13.8* 13.5* 11.3*  --   CREATININE 1.64* 1.80* 2.39* 2.12* 1.78* 1.74*    Estimated Creatinine Clearance: 19.1 mL/min (A) (by C-G formula based on SCr of 1.74 mg/dL (H)).    Allergies  Allergen Reactions  . Penicillins Other (See Comments)    REACTION: causes hallucinations  . Sulfamethoxazole-Trimethoprim Swelling    REACTION: rash  . Bactrim     Unknown per mar  . Trimethoprim     Unknown per mar      Thank you for allowing pharmacy to be a part of this patient's care.  Dolly Rias RPh 06/02/2017, 10:26 AM Pager [phone]

## 2017-06-02 NOTE — Care Management Important Message (Signed)
Important Message  Patient Details  Name: KALLIOPE RIESEN MRN: [id number] Date of Birth: [birth year]   Medicare Important Message Given:  Yes    Kerin Salen 06/02/2017, 1:05 [place] Message  Patient Details  Name: KANOELANI DOBIES MRN: [id number] Date of Birth: 22-Jul-1924   Medicare Important Message Given:  Yes    Kerin Salen 06/02/2017, 1:05 PM

## 2017-06-03 LAB — GLUCOSE, CAPILLARY
GLUCOSE-CAPILLARY: 117 mg/dL — AB (ref 65–99)
GLUCOSE-CAPILLARY: 124 mg/dL — AB (ref 65–99)
Glucose-Capillary: 132 mg/dL — ABNORMAL HIGH (ref 65–99)
Glucose-Capillary: 113 mg/dL — ABNORMAL HIGH (ref 65–99)

## 2017-06-03 NOTE — Progress Notes (Signed)
PROGRESS NOTE    Allison Rodgers  BDZ:[id number] DOB: December 02, 1923 DOA: 05/30/2017 PCP: Sande Brothers, MD  Brief Narrative 83 year old female recently discharged from the hospital with diverticulosis with evidence of diverticulitis on Cipro and Flagyl.She was also diagnosed to have ileus and constipation. He was readmitted a day later with increasing shortness of breath and CT scan of the chest done in the emergency room showed left lower lobe pneumonia and a developing ileus. itdid not show any evidence of obstruction. At the time of admission she was noted to be dehydrated hyernatremic with a sodium of 147 much elevated creatinine 2.39 from the time of admission her creatinine was 1.18. His blood pressure was also elevated. But she does have a habit of not taking her medications she continues to spit out her medications during her last stay in the hospital. This was discussed with her son who is her POA prior to discharge. dw son again today.he wants everything done as of now.patient continues to be npo due to ileus.  Overnight patient started vomiting what looks like stool so an NG tube was placed and had a lot of stool-like material sucked out.  A stat CT of the abdomen is ordered which is pending at this time.  Looking back at the last CAT scan from the 19th shows prominent ileus may be mild obstruction she has had multiple CT scans of her abdomen and pelvis.  Which has not shown any signs of obstruction so far.  This was discussed in detail with the patient's son and daughter-in-law.  Patient also seems to have aspirated with new right upper lobe infiltrates.  Patients family has already set up for palliative care and hospice in place [facility]  But he did not want me to consult them here his plan is to take his mother back to the nursing home and have her seen by palliative care at the nursing home.  However he wants to have the CT scan done.  Assessment &  Plan:   Principal Problem:   Pneumonia Active Problems:   Diabetes mellitus type 2, controlled ([place])   Dementia   AKI (acute kidney injury) ([place])   Protein-calorie malnutrition ([place])   Ileus ([place])   Hypernatremia  1-Multi lobar pneumonia 2-ileus bowel obstruction 3 dementia 4-type 2 diabetes 5-CKD stage III  Patient currently has an NG tube in place draining stool-appearing material.  By CT of the abdomen does show possible mass obstruction in the colon.  Patient's family is looking for a place that can take it with a NG tube in place.  If that is not possible by tomorrow the NG tube will be taken out and discharge her to which she will facility that would accept her.  So the overall plan is to discharge her tomorrow with the tube or without the tube to a skilled nursing nursing home.  This was discussed in detail with the patient's son and his wife.   DVT prophylaxis:  scd Code Status:dnr Family Communication: dw son and dil Disposition Plan: .  Discharge to snf tomorrow. Consultants:  none Procedures: ngtube Antimicrobials: cefepime Objective: Vitals:   06/02/17 1513 06/02/17 2107 06/03/17 0504 06/03/17 1524  BP: (!) 159/62 (!) 157/57 (!) 141/67 (!) 170/63  Pulse: 95 92 (!) 101 (!) 101  Resp: 20 18 18 20   Temp: 98.2 F (36.8 C) 98.3 F (36.8 C) 98.6 F (37 C) 98.4 F (36.9 C)  TempSrc: Axillary Axillary Axillary Axillary  SpO2: 99% 98% 97% 100%  Weight:      Height:        Intake/Output Summary (Last 24 hours) at 06/03/17 1550 Last data filed at 06/02/17 2043  Gross per 24 hour  Intake                0 ml  Output              900 ml  Net             -900 ml   Filed Weights   06/02/17 0619  Weight: 75.3 kg (166 lb)    Examination:  General exam: Appears calm and comfortable .  Patient not responsive to verbal stimuli. Respiratory system: Clear to auscultation. Respiratory effort normal. Cardiovascular system: S1 & S2 heard, RRR. No JVD, murmurs, rubs,  gallops or clicks. No pedal edema. Gastrointestinal system: Abdomen is nondistended, soft and nontender. No organomegaly or masses felt. Normal bowel sounds heard. Central nervous system: Alert and oriented. No focal neurological deficits. Extremities: Symmetric 5 x 5 power. Skin: No rashes, lesions or ulcers Psychiatry: Judgement and insight appear normal. Mood & affect appropriate.     Data Reviewed: I have personally reviewed following labs and imaging studies  CBC:  Recent Labs Lab 05/28/17 0349 05/30/17 0918 05/31/17 0344 06/01/17 0412  WBC 7.9 13.8* 13.5* 11.3*  NEUTROABS  --  12.2*  --   --   HGB 12.5 11.2* 10.4* 10.5*  HCT 38.8 35.9* 34.1* 34.2*  MCV 74.8* 76.2* 77.0* 77.4*  PLT 172 207 181 981   Basic Metabolic Panel:  Recent Labs Lab 05/29/17 0401 05/30/17 0918 05/31/17 0344 06/01/17 0412 06/01/17 1415  NA 146* 147* 146* 142 140  K 2.9* 3.7 3.3* 3.7 4.1  CL 114* 113* 116* 113* 111  CO2 23 24 23 22  21*  GLUCOSE 158* 223* 245* 118* 135*  BUN 28* 56* 61* 50* 51*  CREATININE 1.80* 2.39* 2.12* 1.78* 1.74*  CALCIUM 8.8* 9.2 8.8* 8.7* 8.9   GFR: Estimated Creatinine Clearance: 19.1 mL/min (A) (by C-G formula based on SCr of 1.74 mg/dL (H)). Liver Function Tests:  Recent Labs Lab 05/30/17 0918 05/31/17 0344  AST 19 14*  ALT 17 14  ALKPHOS 65 58  BILITOT 0.8 0.6  PROT 6.9 6.2*  ALBUMIN 3.3* 2.9*    Recent Labs Lab 05/30/17 0918  LIPASE 16    Recent Labs Lab 05/30/17 0918  AMMONIA 17   Coagulation Profile: No results for input(s): INR, PROTIME in the last 168 hours. Cardiac Enzymes:  Recent Labs Lab 05/30/17 0918  TROPONINI 0.04*   BNP (last 3 results) No results for input(s): PROBNP in the last 8760 hours. HbA1C: No results for input(s): HGBA1C in the last 72 hours. CBG:  Recent Labs Lab 06/02/17 1304 06/02/17 1826 06/02/17 2048 06/03/17 0739 06/03/17 1242  GLUCAP 129* 130* 131* 132* 117*   Lipid Profile: No results for  input(s): CHOL, HDL, LDLCALC, TRIG, CHOLHDL, LDLDIRECT in the last 72 hours. Thyroid Function Tests: No results for input(s): TSH, T4TOTAL, FREET4, T3FREE, THYROIDAB in the last 72 hours. Anemia Panel: No results for input(s): VITAMINB12, FOLATE, FERRITIN, TIBC, IRON, RETICCTPCT in the last 72 hours. Sepsis Labs: No results for input(s): PROCALCITON, LATICACIDVEN in the last 168 hours.  Recent Results (from the past 240 hour(s))  MRSA PCR Screening     Status: None   Collection Time: 05/25/17 12:25 AM  Result Value Ref Range Status   MRSA by PCR NEGATIVE  NEGATIVE Final    Comment:        The GeneXpert MRSA Assay (FDA approved for NASAL specimens only), is one component of a comprehensive MRSA colonization surveillance program. It is not intended to diagnose MRSA infection nor to guide or monitor treatment for MRSA infections.   Respiratory Panel by PCR     Status: None   Collection Time: 05/30/17  5:29 PM  Result Value Ref Range Status   Adenovirus NOT DETECTED NOT DETECTED Final   Coronavirus 229E NOT DETECTED NOT DETECTED Final   Coronavirus HKU1 NOT DETECTED NOT DETECTED Final   Coronavirus NL63 NOT DETECTED NOT DETECTED Final   Coronavirus OC43 NOT DETECTED NOT DETECTED Final   Metapneumovirus NOT DETECTED NOT DETECTED Final   Rhinovirus / Enterovirus NOT DETECTED NOT DETECTED Final   Influenza A NOT DETECTED NOT DETECTED Final   Influenza B NOT DETECTED NOT DETECTED Final   Parainfluenza Virus 1 NOT DETECTED NOT DETECTED Final   Parainfluenza Virus 2 NOT DETECTED NOT DETECTED Final   Parainfluenza Virus 3 NOT DETECTED NOT DETECTED Final   Parainfluenza Virus 4 NOT DETECTED NOT DETECTED Final   Respiratory Syncytial Virus NOT DETECTED NOT DETECTED Final   Bordetella pertussis NOT DETECTED NOT DETECTED Final   Chlamydophila pneumoniae NOT DETECTED NOT DETECTED Final   Mycoplasma pneumoniae NOT DETECTED NOT DETECTED Final    Comment: Performed at [facility],  [place] [address], [place], [place] [address]         Radiology Studies: Ct Abdomen Pelvis Wo Contrast  Result Date: 06/02/2017 CLINICAL DATA:  Vomiting. EXAM: CT ABDOMEN AND PELVIS WITHOUT CONTRAST TECHNIQUE: Multidetector CT imaging of the abdomen and pelvis was performed following the standard protocol without IV contrast. COMPARISON:  05/30/2017 FINDINGS: Lower chest: The heart size is normal. No pericardial effusion. Coronary artery calcification is evident. There is bilateral lower lobe collapse/ consolidation was small left pleural effusion. Hepatobiliary: No focal abnormality in the liver on this study without intravenous contrast. Gallbladder surgically absent. No intrahepatic or extrahepatic biliary dilation. Pancreas: Pancreas diffusely atrophic. Spleen: No splenomegaly. No focal mass lesion. Adrenals/Urinary Tract: No adrenal nodule or mass. Tiny probable cyst again noted upper pole left kidney. No hydronephrosis. No evidence for hydroureter. The urinary bladder appears normal for the degree of distention. Stomach/Bowel: Stomach decompressed by NG tube. Duodenum and small bowel loops remain fluid-filled and distended as before. Small bowel loops measure up to about 3.3 cm maximum diameter. Terminal ileum is fluid-filled. The colon is distended and fluid-filled with splenic flexure measuring 5 cm in diameter. The fluid-filled distended colon extends down to the sigmoid segment where diverticular changes noted and apparent luminal narrowing. Vascular/Lymphatic: There is abdominal aortic atherosclerosis without aneurysm. There is no gastrohepatic or hepatoduodenal ligament lymphadenopathy. No intraperitoneal or retroperitoneal lymphadenopathy. No pelvic sidewall lymphadenopathy. Reproductive: Uterus surgically absent.  There is no adnexal mass. Other: Small volume intraperitoneal free fluid noted. Musculoskeletal: Diffuse body wall edema. Tiny sclerotic sacral lesion is stable back to 05/03/2016. No  suspicious lytic or sclerotic osseous abnormality. IMPRESSION: 1. Similar to prior study. There is diffuse fluid-filled, moderately dilated small bowel and colon. Colonic distention extends to the level of the sigmoid colon were there is an apparent transition zone which may be related to obstruction or stenosis from colorectal neoplasm or prior episodes of diverticulitis. No current pericolonic edema or inflammation along the sigmoid colon. Flexible sigmoidoscopy or colonoscopy may be warranted to assess for sigmoid lesion. Electronically Signed   By: Randall Hiss  Tery Sanfilippo M.D.   On: 06/02/2017 15:31   Dg Abd 1 View  Result Date: 06/02/2017 CLINICAL DATA:  Acute onset of generalized abdominal pain. Initial encounter. EXAM: ABDOMEN - 1 VIEW COMPARISON:  Abdominal radiograph performed 06/01/2017 FINDINGS: The patient's enteric tube is noted coiled at the body of the decompressed stomach. There is distention of small-bowel loops to 3.8 cm in diameter, raising concern for dysmotility, as previously noted. The cecum is distended with air. No free intra-abdominal air is seen, though evaluation for free air is limited on a single supine view. Clips are noted within the right upper quadrant, reflecting prior cholecystectomy. Degenerative change is noted along the lower thoracic and lumbar spine. IMPRESSION: 1. Distention of small-bowel loops to 3.8 cm in diameter, concerning for dysmotility, as previously noted. 2. Enteric tube noted coiled at the body of the decompressed stomach. Electronically Signed   By: Garald Balding M.D.   On: 06/02/2017 01:12   Dg Abd 1 View  Result Date: 06/01/2017 CLINICAL DATA:  Nasogastric tube placement. EXAM: ABDOMEN - 1 VIEW COMPARISON:  Earlier today at 0432 hours. FINDINGS: 1917 hours. Nasogastric tube is looped in the stomach with the tip at the gastric body. Mild left hemidiaphragm elevation. Cholecystectomy clips. Gas-filled small bowel loops measuring up to 3.7 cm currently versus  similar on the prior. No gross free intraperitoneal air. Left base consolidation. IMPRESSION: Nasogastric terminating at the body of the stomach. Similar mild to moderate gaseous distension of small bowel loops, suggesting low-grade partial obstruction or ileus. These results will be called to the ordering clinician or representative by the Radiologist Assistant, and communication documented in the PACS or zVision Dashboard. Electronically Signed   By: Abigail Miyamoto M.D.   On: 06/01/2017 20:25   Dg Chest Port 1 View  Result Date: 06/02/2017 CLINICAL DATA:  Acute onset of bibasilar crackles. Initial encounter. EXAM: PORTABLE CHEST 1 VIEW COMPARISON:  Chest radiograph and CT of the chest performed 05/30/2017 FINDINGS: Right upper lobe and left basilar airspace opacification is concerning for multifocal pneumonia. No definite pleural effusion or pneumothorax is seen. The cardiomediastinal silhouette is borderline normal in size. No acute osseous abnormalities are seen. The patient's enteric tube is noted ending overlying the decompressed stomach. Clips are noted within the right upper quadrant, reflecting prior cholecystectomy. IMPRESSION: 1. Right upper lobe and left basilar airspace opacification, reflecting multifocal pneumonia. 2. Enteric tube noted ending about the decompressed stomach. Electronically Signed   By: Garald Balding M.D.   On: 06/02/2017 01:05   Dg Abd Portable 1v  Result Date: 06/02/2017 CLINICAL DATA:  Initial evaluation for NG tube repositioning. EXAM: PORTABLE ABDOMEN - 1 VIEW COMPARISON:  Prior radiograph from earlier same day. FINDINGS: NG tube is been repositioned, with tip in side hole seen overlying the stomach, beyond the GE junction. Tip projects inferiorly. Scattered dilated gas-filled loops of bowel again noted within the lower abdomen. Left pleural effusion with associated left basilar opacity, likely atelectasis. Right upper lobe opacity noted as well. Findings better evaluated  on recent chest radiograph. IMPRESSION: Tip and side hole of enteric tube overlying the stomach, well beyond the GE junction. Tip projects inferiorly. Electronically Signed   By: Jeannine Boga M.D.   On: 06/02/2017 02:27        Scheduled Meds: . dorzolamide  1 drop Both Eyes TID  . hydrALAZINE  50 mg Oral Q8H  . insulin aspart  0-9 Units Subcutaneous TID WC  . latanoprost  1 drop Both Eyes QHS  .  polyethylene glycol  17 g Oral BID  . potassium chloride  40 mEq Oral Once   Continuous Infusions: . sodium chloride 10 mL/hr at 06/02/17 0055  . ceFEPime (MAXIPIME) IV 1 g (06/03/17 1410)     LOS: 4 days      Georgette Shell, MD [facility]  If 7PM-7AM, please contact night-coverage [email or web address] Password [email or web address] 06/03/2017, 3:50 PM

## 2017-06-03 NOTE — Progress Notes (Addendum)
CSW following for disposition. Plan to have pt DC with NG tube. Seeking SNF able to accommodate pt with this need. (pt from [facility] but unable to return to ALF with NG tube)  Discussed with pt's son and attending.   Completed FL2 and made referrals to area SNFs. Will follow up with bed offers. (pt had mits due to having had issues pulling at lines- removed this morning)  Sharren Bridge, MSW, LCSW Clinical Social Work 06/03/2017 [phone]  Unable to locate SNF able to accept pt with NG needing suctioning. Still investigating however no options at this time.  Updated attending-discussing with pt's family and will follow.

## 2017-06-03 NOTE — NC FL2 (Signed)
[place] MEDICAID FL2 LEVEL OF CARE SCREENING TOOL     IDENTIFICATION  Patient Name: Allison Rodgers Birthdate: 12/28/1923 Sex: female Admission Date (Current Location): 05/30/2017  [facility] and [place] Number:  Herbalist and Address:  [facility],  [place], [place]      Provider Number: [id number]  Attending Physician Name and Address:  Georgette Shell, MD  Relative Name and Phone Number:       Current Level of Care: Hospital Recommended Level of Care: [place] Prior Approval Number:    Date Approved/Denied:   PASRR Number: [id number]  Discharge Plan: SNF    Current Diagnoses: Patient Active Problem List   Diagnosis Date Noted  . Pneumonia 05/30/2017  . Ileus ([place]) 05/30/2017  . Hypernatremia 05/30/2017  . Abdominal pain, left lower quadrant   . Colovaginal fistula   . Protein-calorie malnutrition, severe 09/22/2016  . AKI (acute kidney injury) ([place]) 09/21/2016  . Protein-calorie malnutrition ([place]) 09/21/2016  . Diverticulitis 09/18/2016  . Moderate dementia 10/31/2014  . Health care maintenance 02/18/2014  . Other malaise and fatigue 11/22/2013  . Vaginal discharge 11/22/2013  . Gastroparesis 08/23/2013  . Abdominal pain, other specified site 07/13/2013  . Chronic renal failure 05/29/2013  . Volume depletion 05/29/2013  . DM2 (diabetes mellitus, type 2) ([place]) 05/10/2013  . 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 Hyponatremia 05/10/2013  . Glaucoma 05/10/2013  . Type II or unspecified type diabetes mellitus with neurological manifestations, uncontrolled(250.62) 04/20/2013  . Hypokalemia 04/06/2013  . Candida rash of groin 04/06/2013  . Acute encephalopathy 04/02/2013  . Meningitis 04/02/2013  . Dementia 04/02/2013  . Chronic kidney disease, stage IV (severe) ([place]) 04/02/2013  . BENIGN NEOPLASM [place] DIGESTIVE SYSTEM 01/11/2008  . ESOPHAGEAL STRICTURE 01/11/2008   . GERD 10/29/2007  . Diabetes mellitus type 2, controlled ([place]) 06/24/2007  . Morbid obesity ([place]) 06/24/2007  . HYPERTENSION 06/24/2007    Orientation RESPIRATION BLADDER Height & Weight     Self  O2 (2L) Incontinent Weight: 166 lb (75.3 kg) Height:  5' (152.4 cm)  BEHAVIORAL SYMPTOMS/MOOD NEUROLOGICAL BOWEL NUTRITION STATUS      Incontinent NG/panda  AMBULATORY STATUS COMMUNICATION OF NEEDS Skin   Extensive Assist Verbally Normal                       Personal Care Assistance Level of Assistance  Bathing, Feeding, Dressing Bathing Assistance: Maximum assistance Feeding assistance: Independent ((feeding tube))       Functional Limitations Info  Sight, Hearing, Speech Sight Info: Adequate Hearing Info: Adequate Speech Info: Adequate    SPECIAL CARE FACTORS FREQUENCY  PT (By licensed PT)     PT Frequency: 5x              Contractures Contractures Info: Not present    Additional Factors Info  Code Status, Allergies Code Status Info: DNR Allergies Info:  Penicillins, Sulfamethoxazole-trimethoprim, Bactrim, Trimethoprim           Current Medications (06/03/2017):  This is the current hospital active medication list Current Facility-Administered Medications  Medication Dose Route Frequency Provider Last Rate Last Dose  . 0.45 % sodium chloride infusion   Intravenous Continuous Schorr, Rhetta Mura, NP 10 mL/hr at 06/02/17 0055    . bisacodyl (DULCOLAX) suppository 10 mg  10 mg Rectal Daily PRN Manuella Ghazi, Pratik D, DO      . ceFEPIme (MAXIPIME) 1 g in dextrose 5 % 50 mL IVPB  1 g Intravenous Q24H Polly Cobia, [place]   Stopped at 06/02/17 1342  . dorzolamide (TRUSOPT) 2 % ophthalmic solution 1 drop  1 drop Both Eyes TID Georgette Shell, MD   1 drop at 06/02/17 1839  . hydrALAZINE (APRESOLINE) injection 5 mg  5 mg Intravenous Q6H PRN Lovey Newcomer T, NP   5 mg at 06/02/17 2115  . hydrALAZINE (APRESOLINE) tablet 50 mg  50 mg Oral Q8H Shah, Pratik D, DO   50  mg at 05/30/17 2146  . insulin aspart (novoLOG) injection 0-9 Units  0-9 Units Subcutaneous TID WC Georgette Shell, MD   1 Units at 06/03/17 [phone]  . latanoprost (XALATAN) 0.005 % ophthalmic solution 1 drop  1 drop Both Eyes QHS Georgette Shell, MD   1 drop at 06/01/17 2220  . ondansetron (ZOFRAN) tablet 4 mg  4 mg Oral Q6H PRN Manuella Ghazi, Pratik D, DO       Or  . ondansetron (ZOFRAN) injection 4 mg  4 mg Intravenous Q6H PRN Manuella Ghazi, Pratik D, DO      . polyethylene glycol (MIRALAX / GLYCOLAX) packet 17 g  17 g Oral BID Minda Ditto, RPH      . potassium chloride SA (K-DUR,KLOR-CON) CR tablet 40 mEq  40 mEq Oral Once Georgette Shell, MD      . sodium chloride (OCEAN) 0.65 % nasal spray 2 spray  2 spray Each Nare Q2H PRN Georgette Shell, MD      . sorbitol 70 % solution 30 mL  30 mL Oral Daily PRN Minda Ditto, [facility]         Discharge Medications: Please see discharge summary for a list of discharge medications.  Relevant Imaging Results:  Relevant Lab Results:   Additional Information SSN [id number], needs palliative to follow at facility  [facility], LCSW

## 2017-06-04 DIAGNOSIS — R0989 Other specified symptoms and signs involving the circulatory and respiratory systems: Secondary | ICD-10-CM

## 2017-06-04 DIAGNOSIS — J13 Pneumonia due to Streptococcus pneumoniae: Secondary | ICD-10-CM

## 2017-06-04 DIAGNOSIS — N179 Acute kidney failure, unspecified: Secondary | ICD-10-CM

## 2017-06-04 DIAGNOSIS — F0150 Vascular dementia without behavioral disturbance: Secondary | ICD-10-CM

## 2017-06-04 DIAGNOSIS — E1121 Type 2 diabetes mellitus with diabetic nephropathy: Secondary | ICD-10-CM

## 2017-06-04 LAB — CBC
HCT: 28.1 % — ABNORMAL LOW (ref 36.0–46.0)
Hemoglobin: 8.9 g/dL — ABNORMAL LOW (ref 12.0–15.0)
MCH: 24.4 pg — ABNORMAL LOW (ref 26.0–34.0)
MCHC: 31.7 g/dL (ref 30.0–36.0)
MCV: 77 fL — AB (ref 78.0–100.0)
PLATELETS: 250 10*3/uL (ref 150–400)
RBC: 3.65 MIL/uL — ABNORMAL LOW (ref 3.87–5.11)
RDW: 16.2 % — AB (ref 11.5–15.5)
WBC: 10.7 10*3/uL — AB (ref 4.0–10.5)

## 2017-06-04 LAB — COMPREHENSIVE METABOLIC PANEL
ALBUMIN: 2.4 g/dL — AB (ref 3.5–5.0)
ALT: 12 U/L — ABNORMAL LOW (ref 14–54)
AST: 12 U/L — AB (ref 15–41)
Alkaline Phosphatase: 64 U/L (ref 38–126)
Anion gap: 11 (ref 5–15)
BUN: 65 mg/dL — AB (ref 6–20)
CHLORIDE: 110 mmol/L (ref 101–111)
CO2: 24 mmol/L (ref 22–32)
Calcium: 9.1 mg/dL (ref 8.9–10.3)
Creatinine, Ser: 1.85 mg/dL — ABNORMAL HIGH (ref 0.44–1.00)
GFR calc Af Amer: 26 mL/min — ABNORMAL LOW (ref >60)
GFR calc non Af Amer: 23 mL/min — ABNORMAL LOW (ref >60)
Glucose, Bld: 107 mg/dL — ABNORMAL HIGH (ref 65–99)
POTASSIUM: 4.5 mmol/L (ref 3.5–5.1)
SODIUM: 145 mmol/L (ref 135–145)
Total Bilirubin: 1.1 mg/dL (ref 0.3–1.2)
Total Protein: 6.3 g/dL — ABNORMAL LOW (ref 6.5–8.1)

## 2017-06-04 LAB — GLUCOSE, CAPILLARY: GLUCOSE-CAPILLARY: 129 mg/dL — AB (ref 65–99)

## 2017-06-04 NOTE — Clinical Social Work Placement (Signed)
Pt discharging today to admit to [facility] Report [phone] [employer].  Pt will transport via [facility] completed medical necessity form and arranged transportation. Pt's son and family at bedside agreeable to plan- express understanding that pt is transferring to SNF with hospice care that family has/will arrange per their request. Also understand that room/board cost will be expected by facility and son expressed accepting responsibility.  See below for placement details  CLINICAL SOCIAL WORK PLACEMENT  NOTE  Date:  06/04/2017  Patient Details  Name: Allison Rodgers MRN: [id number] Date of Birth: 1923/09/26  Clinical Social Work is seeking post-discharge placement for this patient at the [place] level of care (*CSW will initial, date and re-position this form in  chart as items are completed):  Yes   Patient/family provided with [facility] Work Department's list of facilities offering this level of care within the geographic area requested by the patient (or if unable, by the patient's family).  Yes   Patient/family informed of their freedom to choose among providers that offer the needed level of care, that participate in Medicare, Medicaid or managed care program needed by the patient, have an available bed and are willing to accept the patient.  Yes   Patient/family informed of [facility]'s ownership interest in [facility] and [facility], as well as of the fact that they are under no obligation to receive care at these facilities.  PASRR submitted to EDS on       PASRR number received on       Existing PASRR number confirmed on 06/03/17     FL2 transmitted to all facilities in geographic area requested by pt/family on 06/04/17     FL2 transmitted to all facilities within larger geographic area on       Patient informed that his/her managed care company has contracts with or will negotiate with certain facilities, including  the following:        Yes   Patient/family informed of bed offers received.  Patient chooses bed at [facility]     Physician recommends and patient chooses bed at [facility]    Patient to be transferred to [facility] on 06/04/17.  Patient to be transferred to facility by [facility]     Patient family notified on 06/04/17 of transfer.  Name of family member notified:  son Ulyssus     PHYSICIAN       Additional Comment:    _______________________________________________ Nila Nephew, LCSW 06/04/2017, 11:13 AM  [phone]

## 2017-06-04 NOTE — Progress Notes (Signed)
CSW following for disposition.  Spoke with pt's family (son primarily) and with attending this morning. Family's wish is to have pt transition to SNF with hospice, primarily for comfort care. Understand that SNF unable to accept pt with NG tube and state they discussed this yesterday with attending and agreed to have NG tube removed.   Son states he has set up hospice himself for pt at facility [facility]). Pt's family somewhat short with CSW when subject of hospice was broached. CSW relayed to attending in hopes that family understanding of pt prognosis is clarified.   Pt's son prefers [facility] for SNF. CSW discussed with admissions and also discussed that room/board for SNF not covered by Medicare when pt going with hospice. Son agrees to pay privately for SNF.   Will follow today and assist with transition to SNF when pt medically ready.   Sharren Bridge, MSW, LCSW Clinical Social Work 06/04/2017 [phone]

## 2017-06-04 NOTE — Discharge Summary (Signed)
Physician Discharge Summary  Allison Rodgers MRN: [id number] DOB/AGE: May 04, 1924 83 y.o.  PCP: Sande Brothers, MD   Admit date: 05/30/2017 Discharge date: 06/04/2017  Discharge Diagnoses:    Principal Problem:   Pneumonia Active Problems:   Diabetes mellitus type 2, controlled ([place])   Dementia   AKI (acute kidney injury) ([place])   Protein-calorie malnutrition ([place])   Ileus ([place])   Hypernatremia   Bibasilar crackles    Follow-up recommendations  Patient is being discharged with full comfort care measures, to SNF with the patient's son claims that she has hospice facilities available      Current Discharge Medication List    CONTINUE these medications which have NOT CHANGED   Details  dorzolamide (TRUSOPT) 2 % ophthalmic solution Place 1 drop into both eyes 3 (three) times daily.     sodium chloride (OCEAN) 0.65 % SOLN nasal spray Place 2 sprays into both nostrils as needed for congestion. Reported on 02/14/2016      STOP taking these medications     acetaminophen (TYLENOL) 500 MG tablet      cholecalciferol (VITAMIN D) 400 units TABS tablet      clotrimazole-betamethasone (LOTRISONE) cream      Cranberry 500 MG CAPS      dextromethorphan-guaiFENesin (ROBITUSSIN-DM) 10-100 WU/9WJ liquid      folic acid (FOLVITE) 1 MG tablet      HUMALOG KWIKPEN 100 UNIT/ML KiwkPen      LANTUS 100 UNIT/ML injection      latanoprost (XALATAN) 0.005 % ophthalmic solution      loperamide (IMODIUM) 2 MG capsule      mirtazapine (REMERON) 15 MG tablet      polyethylene glycol powder (GLYCOLAX/MIRALAX) powder      Probiotic Product (ALIGN) 4 MG CAPS      sorbitol 70 % solution      amLODipine (NORVASC) 10 MG tablet      bisacodyl (DULCOLAX) 5 MG EC tablet      ciprofloxacin (CIPRO) 250 MG tablet      docusate sodium (COLACE) 100 MG capsule      hydrALAZINE (APRESOLINE) 50 MG tablet      lactulose (CHRONULAC) 10 GM/15ML solution      metroNIDAZOLE (FLAGYL) 500 MG  tablet      predniSONE (DELTASONE) 5 MG tablet          Discharge Condition: Guarded   Discharge Instructions Get Medicines reviewed and adjusted: Please take all your medications with you for your next visit with your Primary MD  Please request your Primary MD to go over all hospital tests and procedure/radiological results at the follow up, please ask your Primary MD to get all Hospital records sent to his/her office.  If you experience worsening of your admission symptoms, develop shortness of breath, life threatening emergency, suicidal or homicidal thoughts you must seek medical attention immediately by calling 911 or calling your MD immediately if symptoms less severe.  You must read complete instructions/literature along with all the possible adverse reactions/side effects for all the Medicines you take and that have been prescribed to you. Take any new Medicines after you have completely understood and accpet all the possible adverse reactions/side effects.   Do not drive when taking Pain medications.   Do not take more than prescribed Pain, Sleep and Anxiety Medications  Special Instructions: If you have smoked or chewed Tobacco in the last 2 yrs please stop smoking, stop any regular Alcohol and or any Recreational drug use.  Wear Seat  belts while driving.  Please note  You were cared for by a hospitalist during your hospital stay. Once you are discharged, your primary care physician will handle any further medical issues. Please note that NO REFILLS for any discharge medications will be authorized once you are discharged, as it is imperative that you return to your primary care physician (or establish a relationship with a primary care physician if you do not have one) for your aftercare needs so that they can reassess your need for medications and monitor your lab values.  Discharge Instructions    Diet - low sodium heart healthy    Complete by:  As directed     Increase activity slowly    Complete by:  As directed        Allergies  Allergen Reactions  . Penicillins Other (See Comments)    REACTION: causes hallucinations  . Sulfamethoxazole-Trimethoprim Swelling    REACTION: rash  . Bactrim     Unknown per mar  . Trimethoprim     Unknown per mar       Disposition: 03-Skilled Nursing Facility   Consults:   None    Significant Diagnostic Studies:  Ct Abdomen Pelvis Wo Contrast  Result Date: 06/02/2017 CLINICAL DATA:  Vomiting. EXAM: CT ABDOMEN AND PELVIS WITHOUT CONTRAST TECHNIQUE: Multidetector CT imaging of the abdomen and pelvis was performed following the standard protocol without IV contrast. COMPARISON:  05/30/2017 FINDINGS: Lower chest: The heart size is normal. No pericardial effusion. Coronary artery calcification is evident. There is bilateral lower lobe collapse/ consolidation was small left pleural effusion. Hepatobiliary: No focal abnormality in the liver on this study without intravenous contrast. Gallbladder surgically absent. No intrahepatic or extrahepatic biliary dilation. Pancreas: Pancreas diffusely atrophic. Spleen: No splenomegaly. No focal mass lesion. Adrenals/Urinary Tract: No adrenal nodule or mass. Tiny probable cyst again noted upper pole left kidney. No hydronephrosis. No evidence for hydroureter. The urinary bladder appears normal for the degree of distention. Stomach/Bowel: Stomach decompressed by NG tube. Duodenum and small bowel loops remain fluid-filled and distended as before. Small bowel loops measure up to about 3.3 cm maximum diameter. Terminal ileum is fluid-filled. The colon is distended and fluid-filled with splenic flexure measuring 5 cm in diameter. The fluid-filled distended colon extends down to the sigmoid segment where diverticular changes noted and apparent luminal narrowing. Vascular/Lymphatic: There is abdominal aortic atherosclerosis without aneurysm. There is no gastrohepatic or  hepatoduodenal ligament lymphadenopathy. No intraperitoneal or retroperitoneal lymphadenopathy. No pelvic sidewall lymphadenopathy. Reproductive: Uterus surgically absent.  There is no adnexal mass. Other: Small volume intraperitoneal free fluid noted. Musculoskeletal: Diffuse body wall edema. Tiny sclerotic sacral lesion is stable back to 05/03/2016. No suspicious lytic or sclerotic osseous abnormality. IMPRESSION: 1. Similar to prior study. There is diffuse fluid-filled, moderately dilated small bowel and colon. Colonic distention extends to the level of the sigmoid colon were there is an apparent transition zone which may be related to obstruction or stenosis from colorectal neoplasm or prior episodes of diverticulitis. No current pericolonic edema or inflammation along the sigmoid colon. Flexible sigmoidoscopy or colonoscopy may be warranted to assess for sigmoid lesion. Electronically Signed   By: Misty Stanley M.D.   On: 06/02/2017 15:31   Ct Abdomen Pelvis Wo Contrast  Result Date: 05/30/2017 CLINICAL DATA:  Abdominal distention with nausea and abdominal pain. EXAM: CT CHEST, ABDOMEN AND PELVIS WITHOUT CONTRAST TECHNIQUE: Multidetector CT imaging of the chest, abdomen and pelvis was performed following the standard protocol without IV contrast. COMPARISON:  Abdomen and pelvis CT 05/27/2017. FINDINGS: CT CHEST FINDINGS Cardiovascular: Heart size is increased. No pericardial effusion. Atherosclerotic calcification is noted in the wall of the thoracic aorta. Mediastinum/Nodes: No mediastinal lymphadenopathy. No evidence for gross hilar lymphadenopathy although assessment is limited by the lack of intravenous contrast on today's study. Fluid noted in the distal esophagus, likely related to reflux. There is no axillary lymphadenopathy. Lungs/Pleura: Evaluation of lung parenchyma degraded by breathing motion. Ill-defined ground-glass nodules are identified in the left upper lung with more confluent  consolidative change in the posterior left upper lobe. This consolidative change tracks into the left hilum in there is associated prominent left lower lobe consolidation with air bronchograms. Small bilateral pleural effusions are evident. Musculoskeletal: Bone windows reveal no worrisome lytic or sclerotic osseous lesions. CT ABDOMEN PELVIS FINDINGS Hepatobiliary: No focal abnormality in the liver on this study without intravenous contrast. Gallbladder surgically absent. No intrahepatic or extrahepatic biliary dilation. Pancreas: Pancreas diffusely atrophic. Spleen: No splenomegaly. No focal mass lesion. Adrenals/Urinary Tract: No adrenal nodule or mass. Right kidney unremarkable. 12 mm hypoattenuating lesion in the left kidney is stable and likely a cyst. No hydroureteronephrosis. Urinary bladder is nondistended. Stomach/Bowel: Stomach is markedly distended with fluid. Duodenum unremarkable. Small bowel loops in the central abdomen are fluid-filled and distended up to about 3 cm maximum diameter. Fluid is seen in the terminal ileum and the right colon is distended and fluid-filled. Fluid extends distally into the colon is for as the sigmoid segment were there is wall thickening and diverticular change. Fluid also noted in the rectum. Vascular/Lymphatic: There is abdominal aortic atherosclerosis without aneurysm. There is no gastrohepatic or hepatoduodenal ligament lymphadenopathy. No intraperitoneal or retroperitoneal lymphadenopathy. No pelvic sidewall lymphadenopathy. Reproductive: Uterus surgically absent.  There is no adnexal mass. Other: No substantial intraperitoneal free fluid. Musculoskeletal: Diffuse body wall edema noted. Bone windows reveal no worrisome lytic or sclerotic osseous lesions. IMPRESSION: 1. Left lower lobe consolidation compatible with pneumonia. There is consolidative change in the posterior left upper lobe with scattered ground-glass nodules in the left upper lung. This may represent an  associated component of multifocal pneumonia. 2. Small bilateral pleural effusions. 3. Distended fluid-filled stomach is associated with distended fluid-filled small bowel and colon. In given that the distended fluid-filled colon extends distally to the sigmoid segment, imaging features are not suggestive of small-bowel obstruction. There is prominent diverticular change in the sigmoid colon and there may be an obstructing lesion or stenosis at this level resulting in the diffuse small bowel and colonic distention. Small amount of fluid is seen in the rectum, distal to the sigmoid segment. 4. Diffuse body wall edema. 5.  Aortic Atherosclerois (ICD10-170.0) Electronically Signed   By: Misty Stanley M.D.   On: 05/30/2017 12:40   Ct Abdomen Pelvis Wo Contrast  Result Date: 05/27/2017 CLINICAL DATA:  Abdominal pain and distension EXAM: CT ABDOMEN AND PELVIS WITHOUT CONTRAST TECHNIQUE: Multidetector CT imaging of the abdomen and pelvis was performed following the standard protocol without IV contrast. COMPARISON:  05/24/2017 FINDINGS: Lower chest: Small bilateral pleural effusions are noted with mild left basilar atelectasis. This is new from the prior exam. Hepatobiliary: No focal liver abnormality is seen. Status post cholecystectomy. No biliary dilatation. Pancreas: Unremarkable. No pancreatic ductal dilatation or surrounding inflammatory changes. Spleen: Normal in size without focal abnormality. Adrenals/Urinary Tract: The adrenal glands are within normal limits. A hypodensity is noted within the left kidney consistent with cystic lesions stable from the prior exam. No renal calculi or urinary tract  obstructive changes are seen. The bladder remains well distended. Stomach/Bowel: Diverticular changes again seen throughout the colon with diffuse wall thickening. There remain some mild prominence of the colon proximal to this similar to that noted on the prior exam. No other obstructive or inflammatory changes are  noted. The appendix has been surgically removed. Vascular/Lymphatic: Aortic atherosclerosis. No enlarged abdominal or pelvic lymph nodes. Reproductive: Status post hysterectomy. No adnexal masses. Other: No abdominal wall hernia or abnormality. No abdominopelvic ascites. Musculoskeletal: Degenerative changes of lumbar spine are noted. Chronic anterolisthesis of L4 on L5 is noted. IMPRESSION: Stable appearing changes of diverticulosis and smooth muscle wall thickening in the sigmoid colon. Relative dilatation of the colon with retention of fecal material is noted proximally this similar to that seen on the prior exam. Postoperative and chronic changes similar to that seen on the prior exam. No new focal abnormality is noted. Electronically Signed   By: Inez Catalina M.D.   On: 05/27/2017 13:36   Ct Abdomen Pelvis Wo Contrast  Result Date: 05/24/2017 CLINICAL DATA:  Abdominal pain. EXAM: CT ABDOMEN AND PELVIS WITHOUT CONTRAST TECHNIQUE: Multidetector CT imaging of the abdomen and pelvis was performed following the standard protocol without IV contrast. COMPARISON:  CT abdomen and pelvis dated October 24, 2016. FINDINGS: Lower chest: Subsegmental atelectasis.  No acute abnormality. Hepatobiliary: No focal liver abnormality is seen. Status post cholecystectomy. No biliary dilatation. Pancreas: Atrophic. No ductal dilatation or surrounding inflammatory changes. Spleen: Normal in size without focal abnormality. Adrenals/Urinary Tract: The adrenal glands are unremarkable. Stable 1.5 cm cyst in the left upper pole. No renal calculi or hydronephrosis. The bladder is unremarkable. Stomach/Bowel: The stomach is within normal limits. No bowel obstruction. Left-sided diverticulosis with unchanged wall thickening of a relatively long segment of the sigmoid colon with mild surrounding inflammatory changes. Large amount of stool within the remaining colon. Vascular/Lymphatic: Aortic atherosclerosis. No enlarged abdominal or  pelvic lymph nodes. Reproductive: Status post hysterectomy. No adnexal masses. Other: No free fluid or pneumoperitoneum. Musculoskeletal: No acute or significant osseous findings. Degenerative changes of the thoracolumbar spine, including 8 mm anterolisthesis at L4-L5, are unchanged. IMPRESSION: 1. Unchanged wall thickening and inflammatory changes surrounding a relatively long segment of sigmoid colon. Findings could reflect recurrent diverticulitis, however given the similar appearance and location to the prior study, underlying colon cancer cannot be entirely excluded. Correlate with recent colonoscopy. 2. Large amount of stool within the remaining colon proximal to the area of sigmoid wall thickening. Correlate for constipation. 3.  Aortic atherosclerosis (ICD10-I70.0). Electronically Signed   By: Titus Dubin M.D.   On: 05/24/2017 19:36   Dg Chest 2 View  Result Date: 05/24/2017 CLINICAL DATA:  Recent fall.  Weakness. EXAM: CHEST  2 VIEW COMPARISON:  09/29/2016 FINDINGS: Cardiomediastinal silhouette is normal. Mediastinal contours appear intact. Calcific atherosclerotic disease and tortuosity of the aorta. Stable eventration of the left hemidiaphragm. Streaky peribronchial opacities in the left lower lobe. Osseous structures are without acute abnormality. Soft tissues are grossly normal. IMPRESSION: Streaky peribronchial opacities in the left lower lobe may represent atelectasis versus airspace consolidation. No displaced fractures are seen. Electronically Signed   By: Fidela Salisbury M.D.   On: 05/24/2017 19:38   Dg Abd 1 View  Result Date: 06/02/2017 CLINICAL DATA:  Acute onset of generalized abdominal pain. Initial encounter. EXAM: ABDOMEN - 1 VIEW COMPARISON:  Abdominal radiograph performed 06/01/2017 FINDINGS: The patient's enteric tube is noted coiled at the body of the decompressed stomach. There is distention of small-bowel loops  to 3.8 cm in diameter, raising concern for dysmotility,  as previously noted. The cecum is distended with air. No free intra-abdominal air is seen, though evaluation for free air is limited on a single supine view. Clips are noted within the right upper quadrant, reflecting prior cholecystectomy. Degenerative change is noted along the lower thoracic and lumbar spine. IMPRESSION: 1. Distention of small-bowel loops to 3.8 cm in diameter, concerning for dysmotility, as previously noted. 2. Enteric tube noted coiled at the body of the decompressed stomach. Electronically Signed   By: Garald Balding M.D.   On: 06/02/2017 01:12   Dg Abd 1 View  Result Date: 06/01/2017 CLINICAL DATA:  Nasogastric tube placement. EXAM: ABDOMEN - 1 VIEW COMPARISON:  Earlier today at 0432 hours. FINDINGS: 1917 hours. Nasogastric tube is looped in the stomach with the tip at the gastric body. Mild left hemidiaphragm elevation. Cholecystectomy clips. Gas-filled small bowel loops measuring up to 3.7 cm currently versus similar on the prior. No gross free intraperitoneal air. Left base consolidation. IMPRESSION: Nasogastric terminating at the body of the stomach. Similar mild to moderate gaseous distension of small bowel loops, suggesting low-grade partial obstruction or ileus. These results will be called to the ordering clinician or representative by the Radiologist Assistant, and communication documented in the PACS or zVision Dashboard. Electronically Signed   By: Abigail Miyamoto M.D.   On: 06/01/2017 20:25   Dg Abd 1 View  Result Date: 06/01/2017 CLINICAL DATA:  Ileus EXAM: ABDOMEN - 1 VIEW COMPARISON:  05/30/2017 CT and radiographs. FINDINGS: Mildly distended gas-filled loops of small bowel and colon are noted, with decrease distension of small bowel loops. No other significant changes are identified. IMPRESSION: Decreased distension of small bowel loops/ileus. Electronically Signed   By: Margarette Canada M.D.   On: 06/01/2017 07:19   Ct Head Wo Contrast  Result Date: 05/24/2017 CLINICAL  DATA:  "Pt BIB [facility] from [facility]. EMS reports that pt was recently dx'd with a UTI and has been on antibiotics for 3 of her 7 day prescription. She requested transfer today because she/family feels like she is not improving. Potential fall. EXAM: CT HEAD WITHOUT CONTRAST CT CERVICAL SPINE WITHOUT CONTRAST TECHNIQUE: Multidetector CT imaging of the head and cervical spine was performed following the standard protocol without intravenous contrast. Multiplanar CT image reconstructions of the cervical spine were also generated. COMPARISON:  Head CT 09/29/2016 FINDINGS: CT HEAD FINDINGS Brain: No intracranial hemorrhage. No parenchymal contusion. No midline shift or mass effect. Basilar cisterns are patent. No skull base fracture. No fluid in the paranasal sinuses or mastoid air cells. Orbits are normal. There are periventricular and subcortical white matter hypodensities. Generalized cortical atrophy. Vascular: No hyperdense vessel or unexpected calcification. Skull: Normal. Negative for fracture or focal lesion. Sinuses/Orbits: Paranasal sinuses and mastoid air cells are clear. Orbits are clear. Other: None. CT CERVICAL SPINE FINDINGS Alignment: Normal alignment of the cervical vertebral bodies. Skull base and vertebrae: Normal craniocervical junction. No loss of bowel vertebral body height or disc height. Normal facet articulation. No evidence of fracture. Soft tissues and spinal canal: No prevertebral soft tissue swelling. No perispinal or epidural hematoma. Disc levels:  Multilevel disc osteophytic disease. Upper chest: Clear Other: None IMPRESSION: 1. No intracranial trauma. 2. Atrophy and white matter microvascular disease. 3. No cervical spine fracture. Electronically Signed   By: Suzy Bouchard M.D.   On: 05/24/2017 17:16   Ct Chest Wo Contrast  Result Date: 05/30/2017 CLINICAL DATA:  Abdominal distention with nausea  and abdominal pain. EXAM: CT CHEST, ABDOMEN AND PELVIS WITHOUT CONTRAST  TECHNIQUE: Multidetector CT imaging of the chest, abdomen and pelvis was performed following the standard protocol without IV contrast. COMPARISON:  Abdomen and pelvis CT 05/27/2017. FINDINGS: CT CHEST FINDINGS Cardiovascular: Heart size is increased. No pericardial effusion. Atherosclerotic calcification is noted in the wall of the thoracic aorta. Mediastinum/Nodes: No mediastinal lymphadenopathy. No evidence for gross hilar lymphadenopathy although assessment is limited by the lack of intravenous contrast on today's study. Fluid noted in the distal esophagus, likely related to reflux. There is no axillary lymphadenopathy. Lungs/Pleura: Evaluation of lung parenchyma degraded by breathing motion. Ill-defined ground-glass nodules are identified in the left upper lung with more confluent consolidative change in the posterior left upper lobe. This consolidative change tracks into the left hilum in there is associated prominent left lower lobe consolidation with air bronchograms. Small bilateral pleural effusions are evident. Musculoskeletal: Bone windows reveal no worrisome lytic or sclerotic osseous lesions. CT ABDOMEN PELVIS FINDINGS Hepatobiliary: No focal abnormality in the liver on this study without intravenous contrast. Gallbladder surgically absent. No intrahepatic or extrahepatic biliary dilation. Pancreas: Pancreas diffusely atrophic. Spleen: No splenomegaly. No focal mass lesion. Adrenals/Urinary Tract: No adrenal nodule or mass. Right kidney unremarkable. 12 mm hypoattenuating lesion in the left kidney is stable and likely a cyst. No hydroureteronephrosis. Urinary bladder is nondistended. Stomach/Bowel: Stomach is markedly distended with fluid. Duodenum unremarkable. Small bowel loops in the central abdomen are fluid-filled and distended up to about 3 cm maximum diameter. Fluid is seen in the terminal ileum and the right colon is distended and fluid-filled. Fluid extends distally into the colon is for as  the sigmoid segment were there is wall thickening and diverticular change. Fluid also noted in the rectum. Vascular/Lymphatic: There is abdominal aortic atherosclerosis without aneurysm. There is no gastrohepatic or hepatoduodenal ligament lymphadenopathy. No intraperitoneal or retroperitoneal lymphadenopathy. No pelvic sidewall lymphadenopathy. Reproductive: Uterus surgically absent.  There is no adnexal mass. Other: No substantial intraperitoneal free fluid. Musculoskeletal: Diffuse body wall edema noted. Bone windows reveal no worrisome lytic or sclerotic osseous lesions. IMPRESSION: 1. Left lower lobe consolidation compatible with pneumonia. There is consolidative change in the posterior left upper lobe with scattered ground-glass nodules in the left upper lung. This may represent an associated component of multifocal pneumonia. 2. Small bilateral pleural effusions. 3. Distended fluid-filled stomach is associated with distended fluid-filled small bowel and colon. In given that the distended fluid-filled colon extends distally to the sigmoid segment, imaging features are not suggestive of small-bowel obstruction. There is prominent diverticular change in the sigmoid colon and there may be an obstructing lesion or stenosis at this level resulting in the diffuse small bowel and colonic distention. Small amount of fluid is seen in the rectum, distal to the sigmoid segment. 4. Diffuse body wall edema. 5.  Aortic Atherosclerois (ICD10-170.0) Electronically Signed   By: Misty Stanley M.D.   On: 05/30/2017 12:40   Ct Cervical Spine Wo Contrast  Result Date: 05/24/2017 CLINICAL DATA:  "Pt BIB [facility] from [facility]. EMS reports that pt was recently dx'd with a UTI and has been on antibiotics for 3 of her 7 day prescription. She requested transfer today because she/family feels like she is not improving. Potential fall. EXAM: CT HEAD WITHOUT CONTRAST CT CERVICAL SPINE WITHOUT CONTRAST TECHNIQUE: Multidetector CT  imaging of the head and cervical spine was performed following the standard protocol without intravenous contrast. Multiplanar CT image reconstructions of the cervical spine were also generated.  COMPARISON:  Head CT 09/29/2016 FINDINGS: CT HEAD FINDINGS Brain: No intracranial hemorrhage. No parenchymal contusion. No midline shift or mass effect. Basilar cisterns are patent. No skull base fracture. No fluid in the paranasal sinuses or mastoid air cells. Orbits are normal. There are periventricular and subcortical white matter hypodensities. Generalized cortical atrophy. Vascular: No hyperdense vessel or unexpected calcification. Skull: Normal. Negative for fracture or focal lesion. Sinuses/Orbits: Paranasal sinuses and mastoid air cells are clear. Orbits are clear. Other: None. CT CERVICAL SPINE FINDINGS Alignment: Normal alignment of the cervical vertebral bodies. Skull base and vertebrae: Normal craniocervical junction. No loss of bowel vertebral body height or disc height. Normal facet articulation. No evidence of fracture. Soft tissues and spinal canal: No prevertebral soft tissue swelling. No perispinal or epidural hematoma. Disc levels:  Multilevel disc osteophytic disease. Upper chest: Clear Other: None IMPRESSION: 1. No intracranial trauma. 2. Atrophy and white matter microvascular disease. 3. No cervical spine fracture. Electronically Signed   By: Suzy Bouchard M.D.   On: 05/24/2017 17:16   Dg Chest Port 1 View  Result Date: 06/02/2017 CLINICAL DATA:  Acute onset of bibasilar crackles. Initial encounter. EXAM: PORTABLE CHEST 1 VIEW COMPARISON:  Chest radiograph and CT of the chest performed 05/30/2017 FINDINGS: Right upper lobe and left basilar airspace opacification is concerning for multifocal pneumonia. No definite pleural effusion or pneumothorax is seen. The cardiomediastinal silhouette is borderline normal in size. No acute osseous abnormalities are seen. The patient's enteric tube is noted  ending overlying the decompressed stomach. Clips are noted within the right upper quadrant, reflecting prior cholecystectomy. IMPRESSION: 1. Right upper lobe and left basilar airspace opacification, reflecting multifocal pneumonia. 2. Enteric tube noted ending about the decompressed stomach. Electronically Signed   By: Garald Balding M.D.   On: 06/02/2017 01:05   Dg Abd 2 Views  Result Date: 05/27/2017 CLINICAL DATA:  Nausea, history of diverticulitis, gastritis, hiatal hernia, diabetes mellitus, GERD EXAM: ABDOMEN - 2 VIEW COMPARISON:  05/26/2017 FINDINGS: Minimal gas distention of transverse colon. Normal in caliber scattered air-filled loops of large and small bowel throughout remainder of abdomen. No evidence of bowel obstruction or bowel wall thickening. No free intraperitoneal air. Mild atelectasis versus consolidation in LEFT lower lobe. Surgical clips RIGHT upper quadrant likely reflect prior cholecystectomy. Osseous demineralization with degenerative disc and facet disease changes of the thoracolumbar spine. Scattered pelvic phleboliths without definite urinary tract calcification. IMPRESSION: Nonspecific bowel gas pattern. Question atelectasis versus consolidation LEFT lower lobe. Electronically Signed   By: Lavonia Dana M.D.   On: 05/27/2017 09:08   Dg Abdomen Acute W/chest  Result Date: 05/30/2017 CLINICAL DATA:  Shortness of breath . EXAM: DG ABDOMEN ACUTE W/ 1V CHEST COMPARISON:  CT 05/27/2017.  Abdominal series 1016 2018. FINDINGS: Chest x-ray reveals left lower lobe atelectasis and consolidation. Small left pleural effusion. Surgical clips right upper quadrant. Multiple loops of distended small bowel are noted. There is a paucity of colonic gas. Findings consistent with small-bowel obstruction. IMPRESSION: 1. Multiple dilated loops of small bowel with paucity of colonic gas noted on today's exam. Findings suggest small bowel obstruction. Follow-up exam suggested to demonstrate resolution. No  free air identified. 2. Left lower lobe atelectasis and consolidation. Small left pleural effusion. Electronically Signed   By: Marcello Moores  Register   On: 05/30/2017 10:21   Dg Abd Portable 1v  Result Date: 06/02/2017 CLINICAL DATA:  Initial evaluation for NG tube repositioning. EXAM: PORTABLE ABDOMEN - 1 VIEW COMPARISON:  Prior radiograph from earlier same  day. FINDINGS: NG tube is been repositioned, with tip in side hole seen overlying the stomach, beyond the GE junction. Tip projects inferiorly. Scattered dilated gas-filled loops of bowel again noted within the lower abdomen. Left pleural effusion with associated left basilar opacity, likely atelectasis. Right upper lobe opacity noted as well. Findings better evaluated on recent chest radiograph. IMPRESSION: Tip and side hole of enteric tube overlying the stomach, well beyond the GE junction. Tip projects inferiorly. Electronically Signed   By: Jeannine Boga M.D.   On: 06/02/2017 02:27   Dg Abd Portable 1v  Result Date: 05/26/2017 CLINICAL DATA:  Constipation EXAM: PORTABLE ABDOMEN - 1 VIEW COMPARISON:  CT abdomen and pelvis May 24, 2017 FINDINGS: There remains mild dilatation of the sigmoid colon. Wall does not appear significantly thickened by radiography in this area. Elsewhere, there is moderate stool throughout colon. There is no appreciable small bowel dilatation. No air-fluid level. No free air evident. There is a surgical clip in the right mid abdomen. IMPRESSION: Mild dilatation of the sigmoid colon region without significant wall thickening by radiography. No frank bowel obstruction. No free air. Moderate stool in colon. Electronically Signed   By: Lowella Grip III M.D.   On: 05/26/2017 08:13       Filed Weights   06/02/17 1497  Weight: 75.3 kg (166 lb)     Microbiology: Recent Results (from the past 240 hour(s))  Respiratory Panel by PCR     Status: None   Collection Time: 05/30/17  5:29 PM  Result Value Ref Range  Status   Adenovirus NOT DETECTED NOT DETECTED Final   Coronavirus 229E NOT DETECTED NOT DETECTED Final   Coronavirus HKU1 NOT DETECTED NOT DETECTED Final   Coronavirus NL63 NOT DETECTED NOT DETECTED Final   Coronavirus OC43 NOT DETECTED NOT DETECTED Final   Metapneumovirus NOT DETECTED NOT DETECTED Final   Rhinovirus / Enterovirus NOT DETECTED NOT DETECTED Final   Influenza A NOT DETECTED NOT DETECTED Final   Influenza B NOT DETECTED NOT DETECTED Final   Parainfluenza Virus 1 NOT DETECTED NOT DETECTED Final   Parainfluenza Virus 2 NOT DETECTED NOT DETECTED Final   Parainfluenza Virus 3 NOT DETECTED NOT DETECTED Final   Parainfluenza Virus 4 NOT DETECTED NOT DETECTED Final   Respiratory Syncytial Virus NOT DETECTED NOT DETECTED Final   Bordetella pertussis NOT DETECTED NOT DETECTED Final   Chlamydophila pneumoniae NOT DETECTED NOT DETECTED Final   Mycoplasma pneumoniae NOT DETECTED NOT DETECTED Final    Comment: Performed at [facility], [address], [place], [place] [address]       Blood Culture    Component Value Date/Time   SDES BLOOD RIGHT FOREARM 03/30/2013 1305   SPECREQUEST BOTTLES DRAWN AEROBIC ONLY 1ML 03/30/2013 1305   CULT  03/30/2013 1305    NO GROWTH 5 DAYS Performed at [facility] 04/05/2013 FINAL 03/30/2013 1305      Labs: Results for orders placed or performed during the hospital encounter of 05/30/17 (from the past 48 hour(s))  Glucose, capillary     Status: Abnormal   Collection Time: 06/02/17  1:04 PM  Result Value Ref Range   Glucose-Capillary 129 (H) 65 - 99 mg/dL  Glucose, capillary     Status: Abnormal   Collection Time: 06/02/17  6:26 PM  Result Value Ref Range   Glucose-Capillary 130 (H) 65 - 99 mg/dL  Glucose, capillary     Status: Abnormal   Collection Time: 06/02/17  8:48  PM  Result Value Ref Range   Glucose-Capillary 131 (H) 65 - 99 mg/dL  Glucose, capillary     Status: Abnormal   Collection Time: 06/03/17   7:39 AM  Result Value Ref Range   Glucose-Capillary 132 (H) 65 - 99 mg/dL   Comment 1 Notify RN    Comment 2 Document in Chart   Glucose, capillary     Status: Abnormal   Collection Time: 06/03/17 12:42 PM  Result Value Ref Range   Glucose-Capillary 117 (H) 65 - 99 mg/dL   Comment 1 Notify RN    Comment 2 Document in Chart   Glucose, capillary     Status: Abnormal   Collection Time: 06/03/17  5:47 PM  Result Value Ref Range   Glucose-Capillary 124 (H) 65 - 99 mg/dL   Comment 1 Notify RN    Comment 2 Document in Chart   Glucose, capillary     Status: Abnormal   Collection Time: 06/03/17  8:26 PM  Result Value Ref Range   Glucose-Capillary 113 (H) 65 - 99 mg/dL  Glucose, capillary     Status: Abnormal   Collection Time: 06/04/17  7:32 AM  Result Value Ref Range   Glucose-Capillary 129 (H) 65 - 99 mg/dL     Lipid Panel     Component Value Date/Time   CHOL 130 05/05/2017 1132   TRIG 71.0 05/05/2017 1132   TRIG 65 07/16/2006 1041   HDL 59.50 05/05/2017 1132   CHOLHDL 2 05/05/2017 1132   VLDL 14.2 05/05/2017 1132   LDLCALC 56 05/05/2017 1132     Lab Results  Component Value Date   HGBA1C 6.5 03/04/2017   HGBA1C 5.3 11/11/2016   HGBA1C 5.5 08/15/2016     Lab Results  Component Value Date   MICROALBUR 0.8 03/05/2017   LDLCALC 56 05/05/2017   CREATININE 1.74 (H) 06/01/2017     HPI :  83 year old female recently discharged from the hospital with diverticulosis with evidence of diverticulitis on Cipro and Flagyl.She was also diagnosed to have ileus and constipation. He was readmitted a day later with increasing shortness of breath and CT scan of the chest done in the emergency room showed left lower lobe pneumonia and a developing ileus. itdid not show any evidence of obstruction. At the time of admission she was noted to be dehydrated hyernatremic with a sodium of 147 much elevated creatinine 2.39 from the time of admission her creatinine was 1.18.   blood  pressure was also elevated. But she does have a habit of not taking her medications she continues to spit out her medications during her last stay in the hospital. This was discussed with her son who is her POA prior to discharge. Son   wants everything done as of now.patient continues to be npo due to ileus.Overnight 10/22 patient started vomiting what looks like stool so an NG tube was placed and had a lot of stool-like material sucked out.  CT of the abdomen showed colonic distention extending into the level of the sigmoid colon with apparent transition zone, probably related to obstruction   due to colorectal neoplasm versus diverticulitis.    10/23 Patient also seems to have aspirated with new right upper lobe infiltrates. Patientsfamily has already set up for palliative care and hospice in place they have been in touch with [facility]. Son did not want   to consult palliative care in the [place] lobar pneumonia-likely secondary to aspiration event,  started on  Cefepime, now on no abx due to full comfort measures   2-ileus bowel obstruction-seen on CT scan on 06/02/17. Continue NG tube to suction, patient has not had documented bowel movement since  admission. Continue NG tube for now. Doubt symptoms will resolve and the patient  is probably not a candidate for surgery, given underlying comorbidities.  Will probably needs [facility] ,more than SNF. Moreover SNF will not accept patient with NGT, NGT dc'd  As son now wants full comfort measures    3 dementia-very lethargic and noncommunicative at baseline  4-type 2 diabetes dc'd sliding scale insulin  5-CKD stage III-baseline creatinine around 1.7   Discharge Exam:  Blood pressure (!) 151/69, pulse 92, temperature 98 F (36.7 C), temperature source Axillary, resp. rate 18, height 5' (1.524 m), weight 75.3 kg (166 lb), SpO2 98 %.   General exam: Appears calm and comfortable .  Patient not  responsive to verbal stimuli. Respiratory system: Clear to auscultation. Respiratory effort normal. Cardiovascular system: S1 & S2 heard, RRR. No JVD, murmurs, rubs, gallops or clicks. No pedal edema. Gastrointestinal system: Abdomen is nondistended, soft and nontender. No organomegaly or masses felt. Normal bowel sounds heard. Central nervous system: Alert and oriented. No focal neurological deficits. Extremities: Symmetric 5 x 5 power. Skin: No rashes, lesions or ulcers Psychiatry: Judgement and insight appear normal. Mood & affect appropriate.    Follow-up Information    Sande Brothers, MD. Call.   Specialty:  Internal Medicine Why:  hospital follow up in 3-5 days  Contact information: [address] [place] [address] [phone]           Signed: Reyne Dumas 06/04/2017, 10:48 AM        Time spent >1 hour

## 2017-06-12 DEATH — deceased

## 2017-07-08 ENCOUNTER — Ambulatory Visit: Payer: Medicare Other | Admitting: Endocrinology

## 2018-11-04 IMAGING — CT CT ABD-PELV W/O CM
2 of 4 series · 16 of 46 positions shown, 18 images · non-contrast
Comparison: CT abdomen and pelvis dated October 24, 2016.

CLINICAL DATA: Abdominal pain.

EXAM:
CT ABDOMEN AND PELVIS WITHOUT CONTRAST
TECHNIQUE: Multidetector CT imaging of the abdomen and pelvis was performed
following the standard protocol without IV contrast.

[Series 2: abd/pel w/o · axial · non-contrast · 0.74mm/px · z∈[+1123,+1508]mm · 13 of 87 slices shown, 15 images]
[im 5/87  soft-tissue]
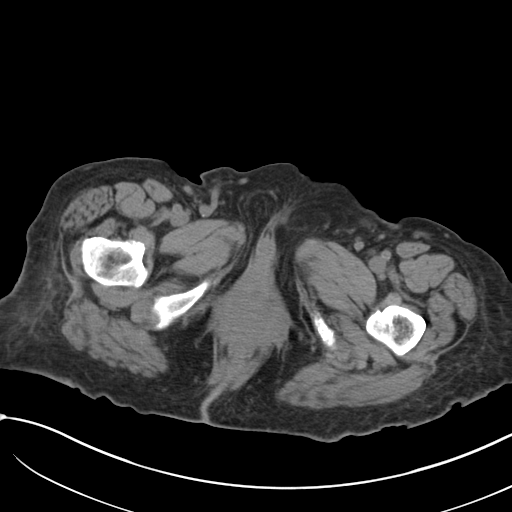
[im 5/87  bone]
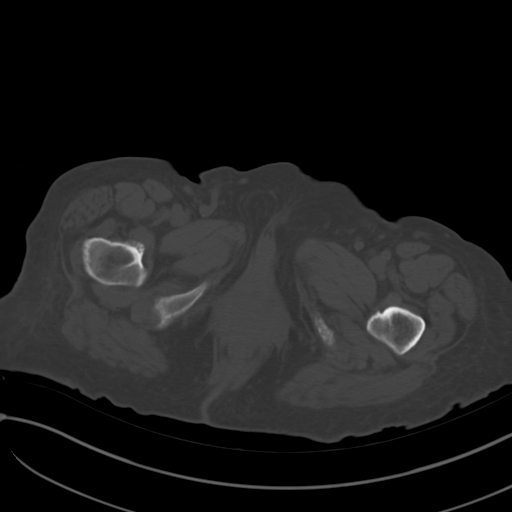
[im 13/87  soft-tissue]
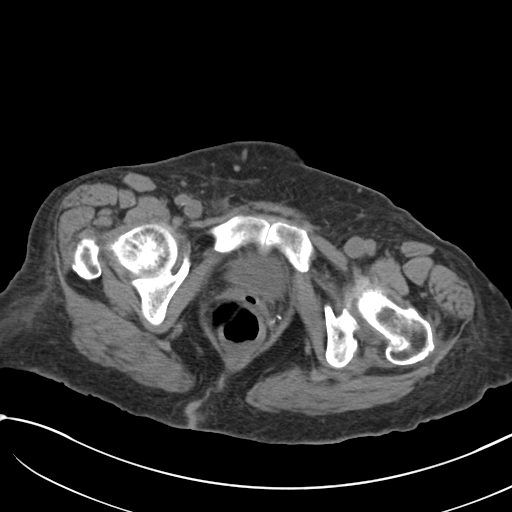
[im 18/87  soft-tissue]
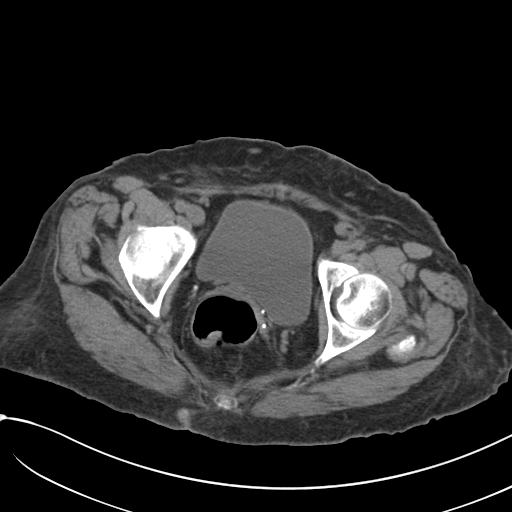
[im 26/87  soft-tissue]
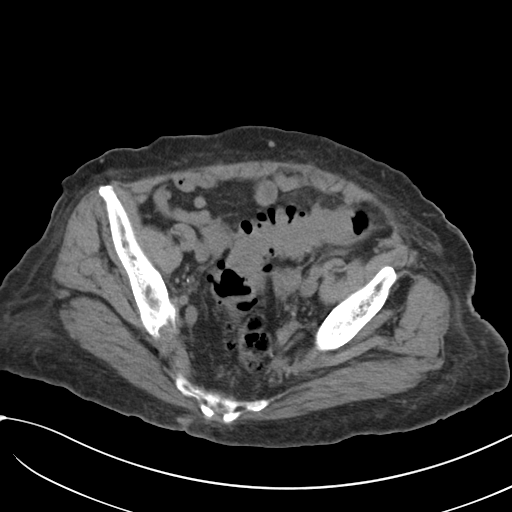
[im 31/87  soft-tissue]
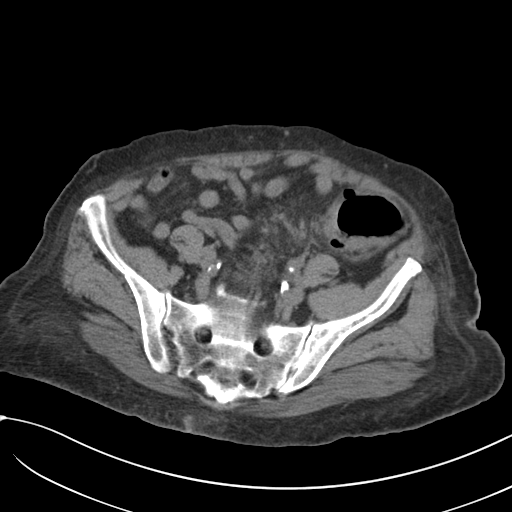
[im 39/87  soft-tissue]
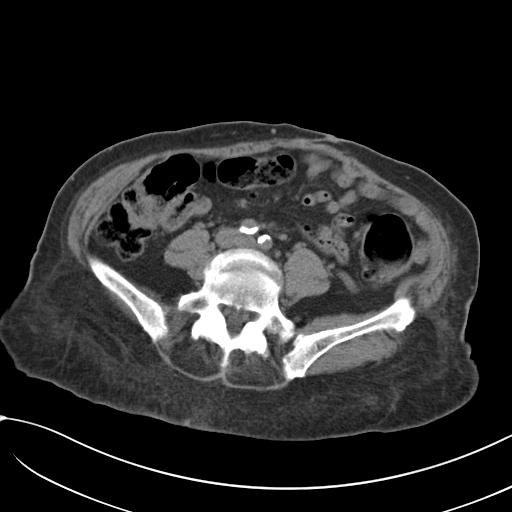
[im 44/87  soft-tissue]
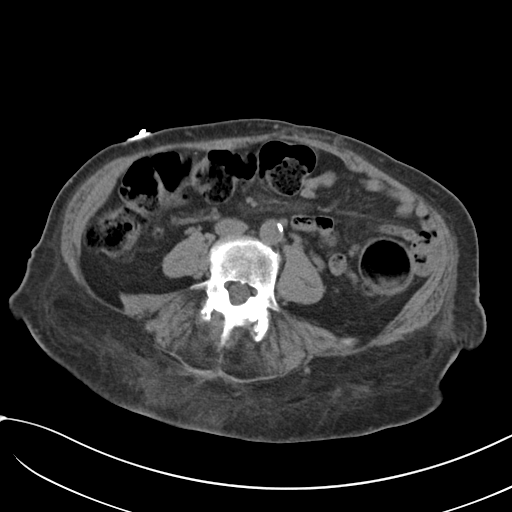
[im 48/87  soft-tissue]
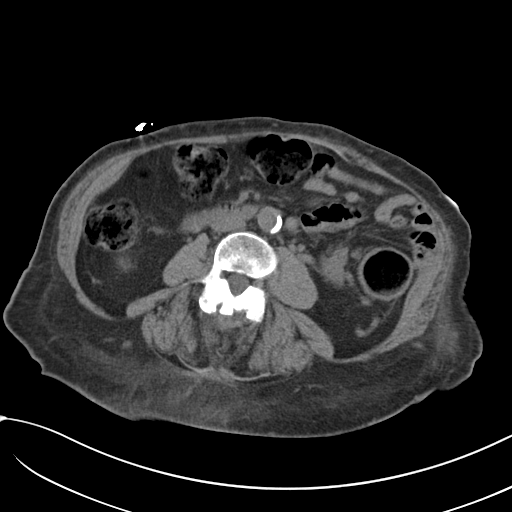
[im 56/87  soft-tissue]
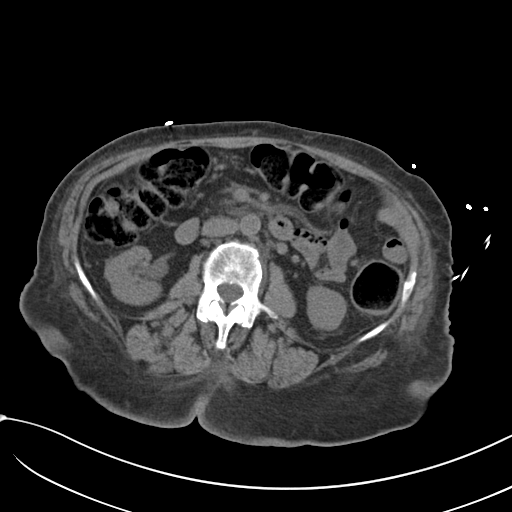
[im 56/87  bone]
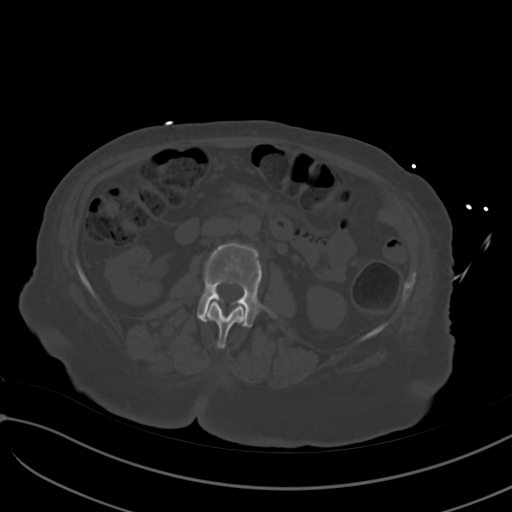
[im 61/87  soft-tissue]
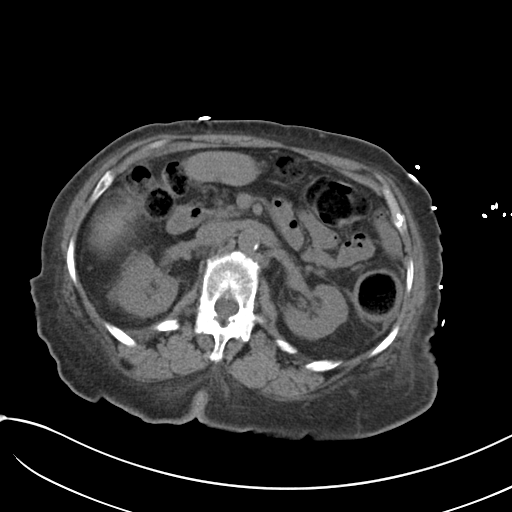
[im 69/87  soft-tissue]
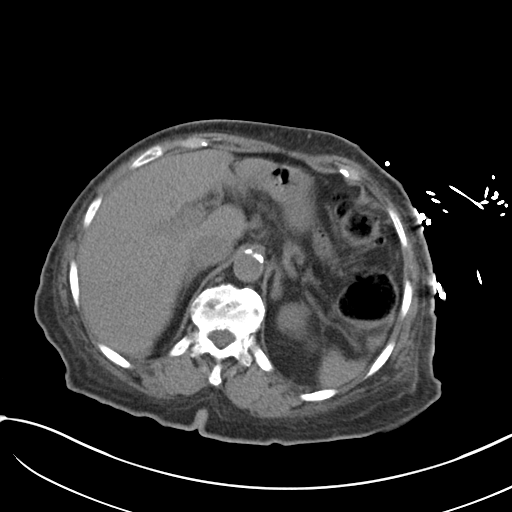
[im 74/87  soft-tissue]
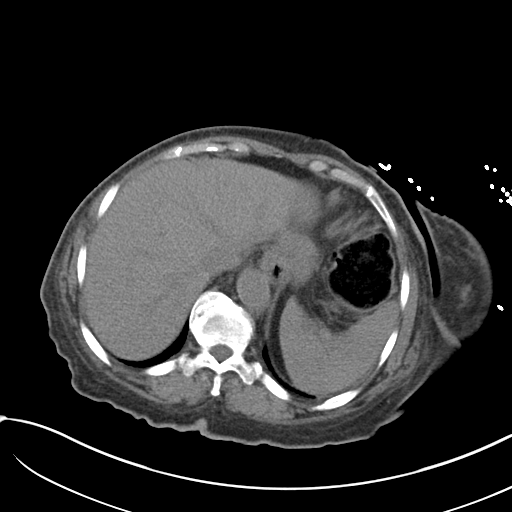
[im 82/87  soft-tissue]
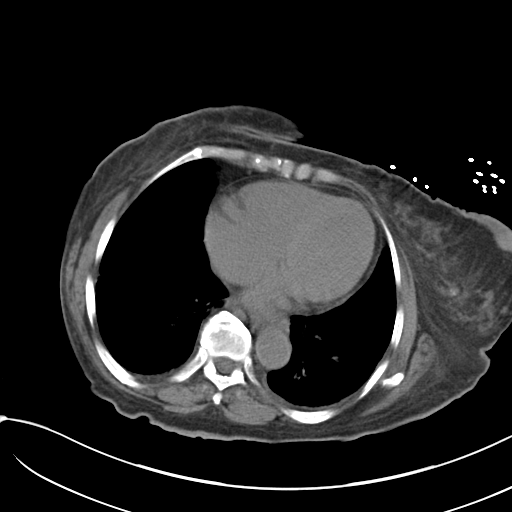

[Series 3: coronal · coronal · 0.84mm/px · 3 of 117 slices shown]
[im 39/117  soft-tissue]
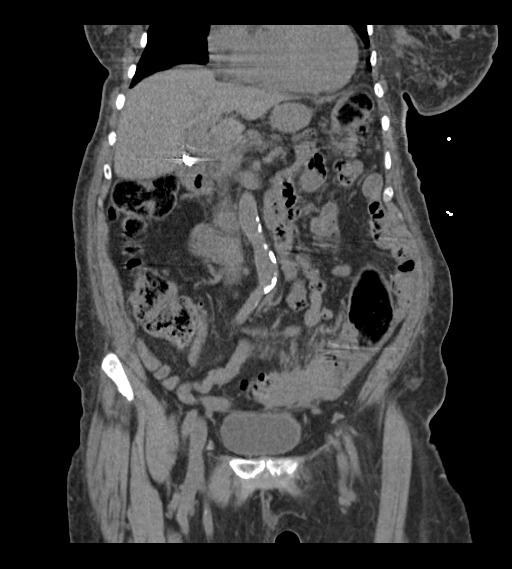
[im 52/117  soft-tissue]
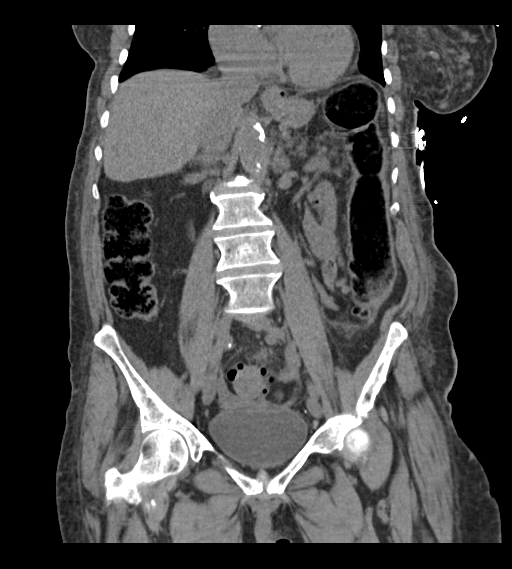
[im 65/117  soft-tissue]
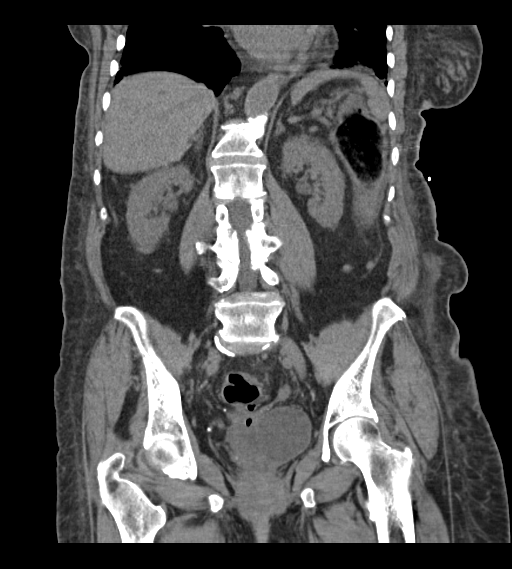

[16 of 46 positions shown; findings below may reference images not displayed]

FINDINGS: Lower chest: Subsegmental atelectasis.  No acute abnormality.

Hepatobiliary: No focal liver abnormality is seen. Status post
cholecystectomy. No biliary dilatation.

Pancreas: Atrophic. No ductal dilatation or surrounding inflammatory
changes.

Spleen: Normal in size without focal abnormality.

Adrenals/Urinary Tract: The adrenal glands are unremarkable. Stable
1.5 cm cyst in the left upper pole. No renal calculi or
hydronephrosis. The bladder is unremarkable.

Stomach/Bowel: The stomach is within normal limits. No bowel
obstruction. Left-sided diverticulosis with unchanged wall
thickening of a relatively long segment of the sigmoid colon with
mild surrounding inflammatory changes. Large amount of stool within
the remaining colon.

Vascular/Lymphatic: Aortic atherosclerosis. No enlarged abdominal or
pelvic lymph nodes.

Reproductive: Status post hysterectomy. No adnexal masses.

Other: No free fluid or pneumoperitoneum.

Musculoskeletal: No acute or significant osseous findings.
Degenerative changes of the thoracolumbar spine, including 8 mm
anterolisthesis at L4-L5, are unchanged.
IMPRESSION: 1. Unchanged wall thickening and inflammatory changes surrounding a
relatively long segment of sigmoid colon. Findings could reflect
recurrent diverticulitis, however given the similar appearance and
location to the prior study, underlying colon cancer cannot be
entirely excluded. Correlate with recent colonoscopy.
2. Large amount of stool within the remaining colon proximal to the
area of sigmoid wall thickening. Correlate for constipation.
3.  Aortic atherosclerosis ([id number]).

## 2018-11-04 IMAGING — CR DG CHEST 2V
2 series · 2 of 2 positions shown · non-contrast
Comparison: 09/29/2016

CLINICAL DATA: Recent fall.  Weakness.

EXAM:
CHEST  2 VIEW

[w chest pa]
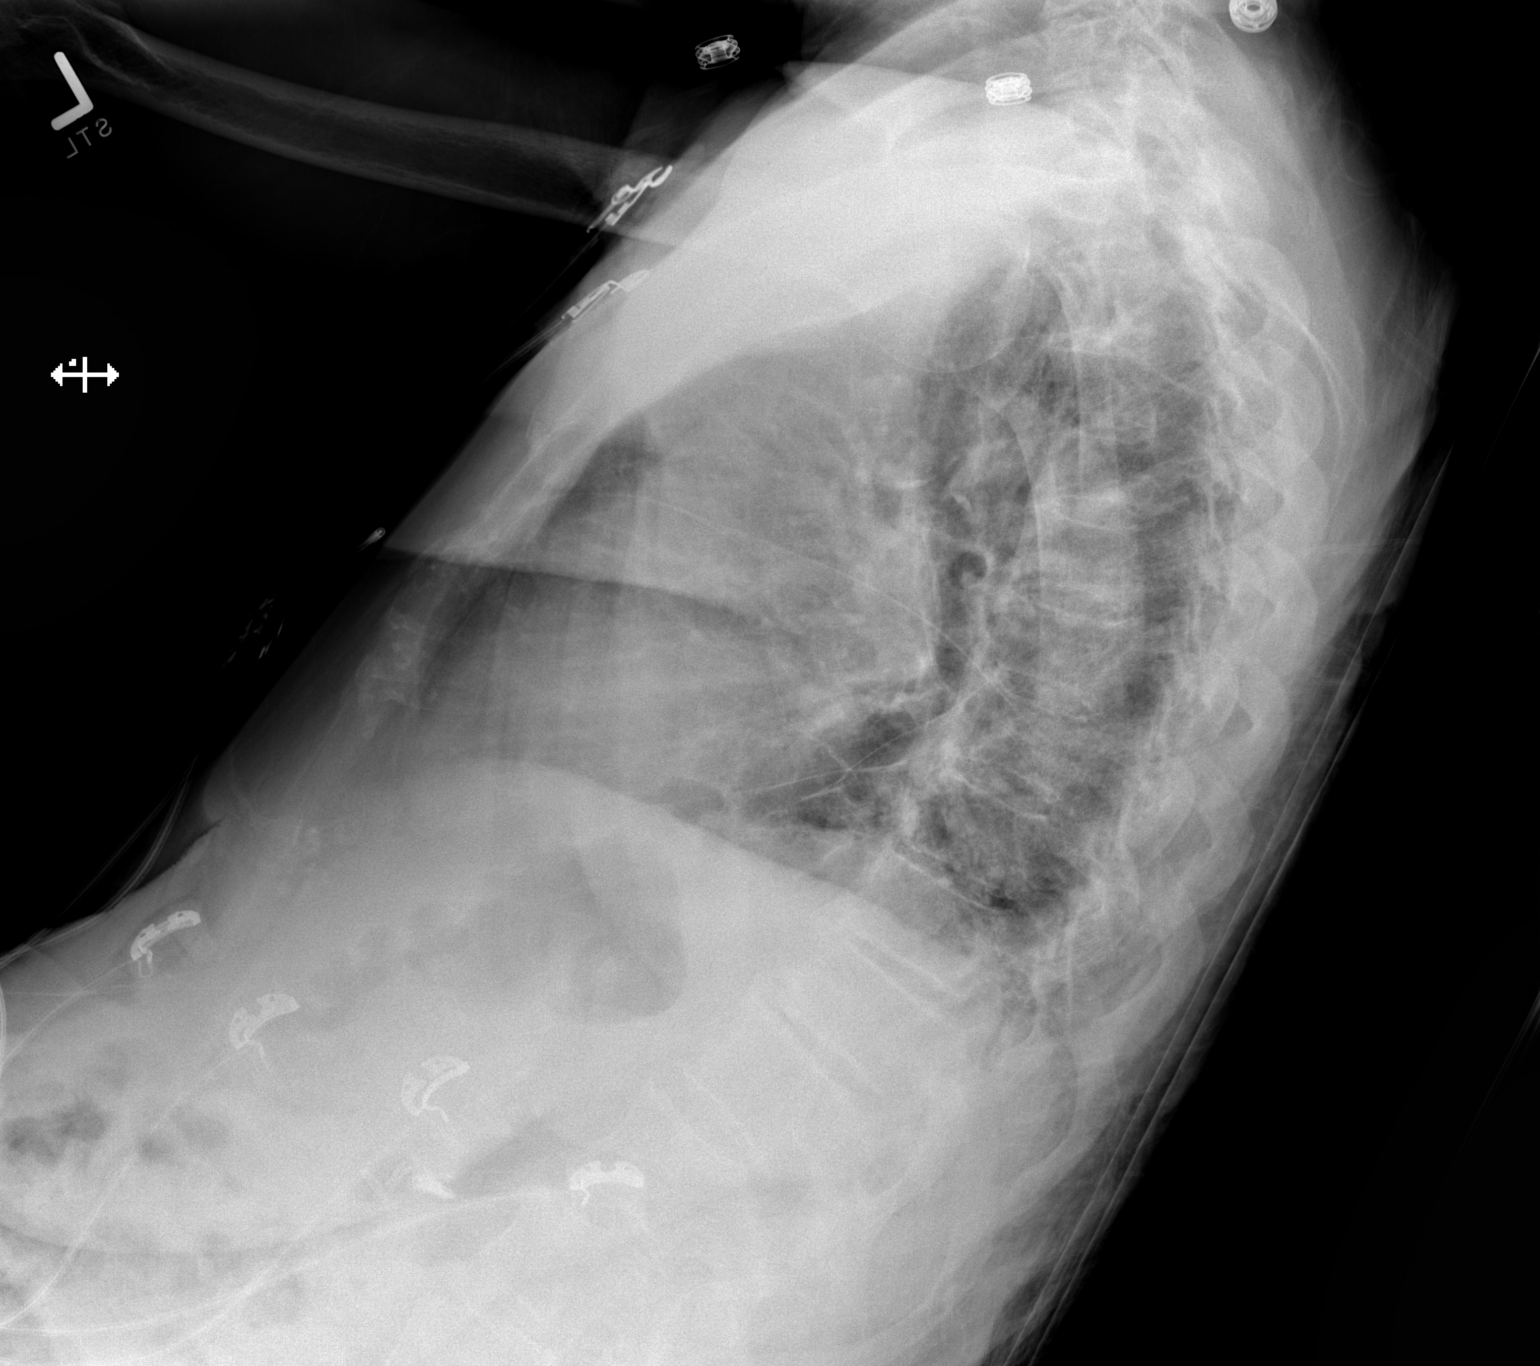

[x chest ap]
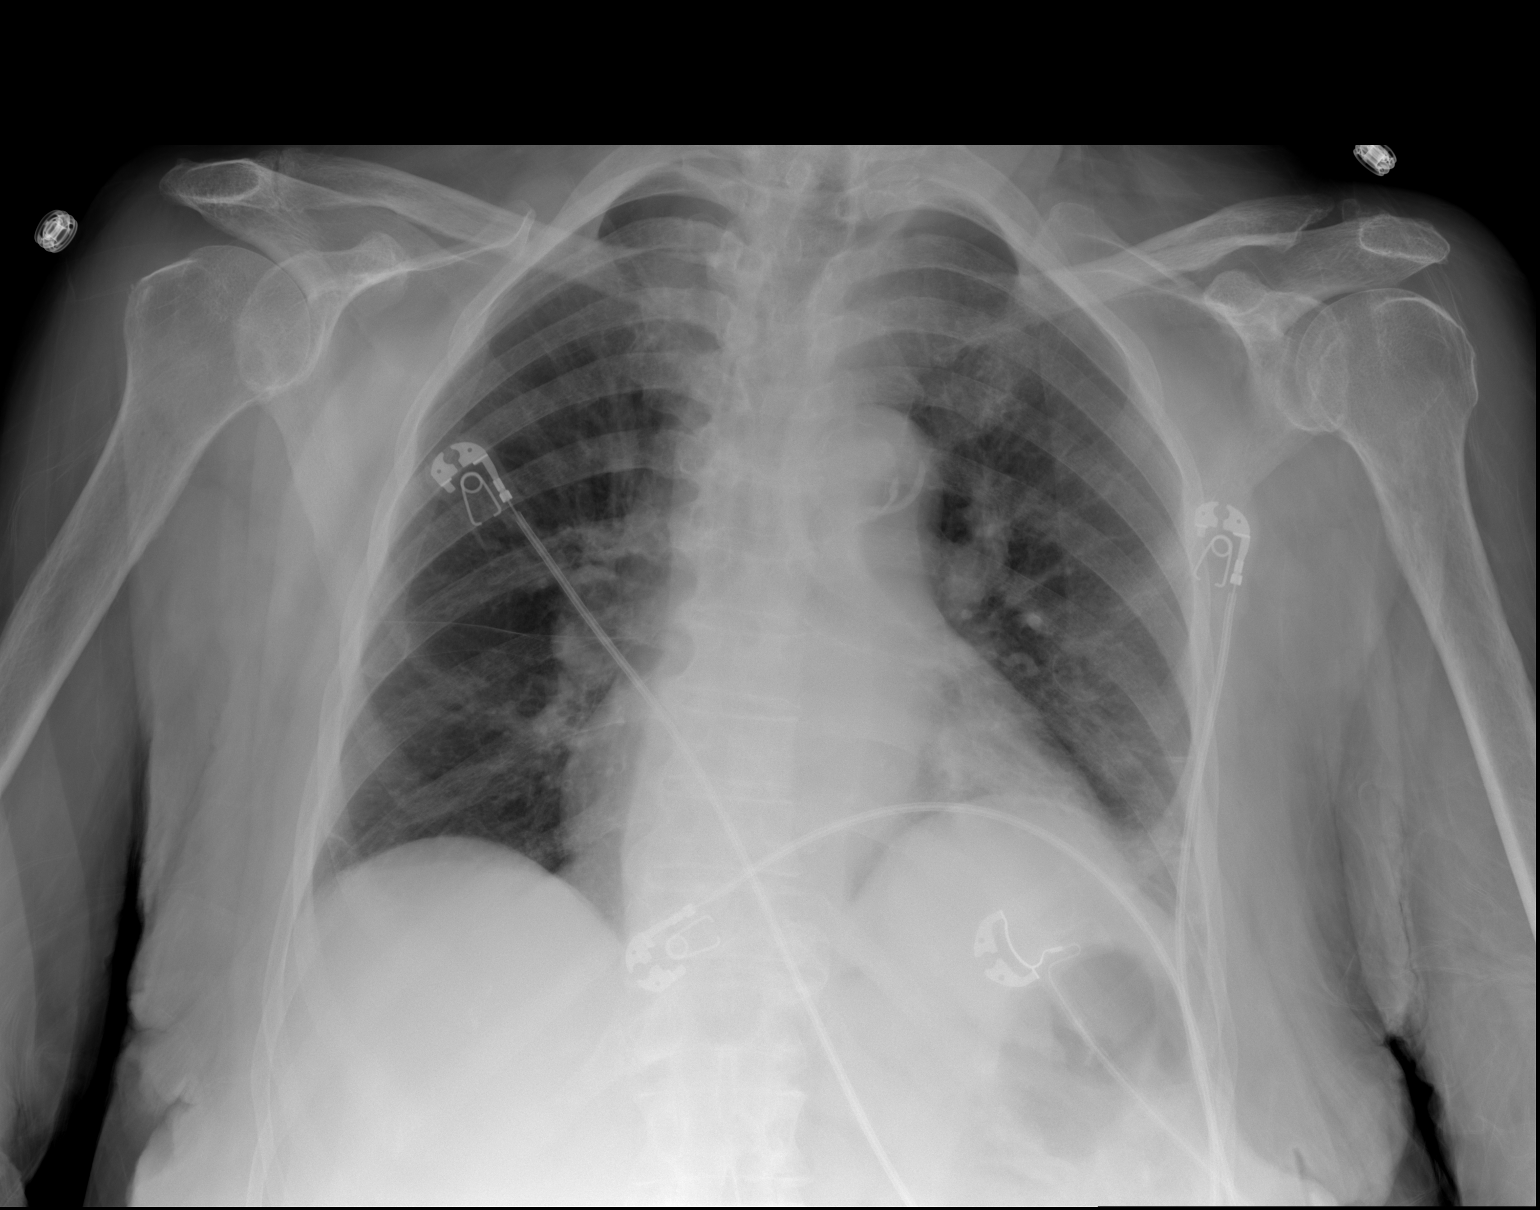

[2 of 2 positions shown; findings below may reference images not displayed]

FINDINGS: Cardiomediastinal silhouette is normal. Mediastinal contours appear
intact. Calcific atherosclerotic disease and tortuosity of the
aorta.

Stable eventration of the left hemidiaphragm. Streaky peribronchial
opacities in the left lower lobe.

Osseous structures are without acute abnormality. Soft tissues are
grossly normal.
IMPRESSION: Streaky peribronchial opacities in the left lower lobe may represent
atelectasis versus airspace consolidation.

No displaced fractures are seen.
